# Patient Record
Sex: Male | Born: 1950 | Race: Black or African American | Hispanic: No | Marital: Single | Smoking: Current every day smoker
Health system: Southern US, Community
[De-identification: ages and names within clinical notes are randomized; demographics above are authoritative.]

## PROBLEM LIST (undated history)

## (undated) DIAGNOSIS — E785 Hyperlipidemia, unspecified: Secondary | ICD-10-CM

## (undated) DIAGNOSIS — F209 Schizophrenia, unspecified: Secondary | ICD-10-CM

## (undated) DIAGNOSIS — I10 Essential (primary) hypertension: Secondary | ICD-10-CM

## (undated) DIAGNOSIS — F32A Depression, unspecified: Secondary | ICD-10-CM

## (undated) DIAGNOSIS — F329 Major depressive disorder, single episode, unspecified: Secondary | ICD-10-CM

## (undated) DIAGNOSIS — F200 Paranoid schizophrenia: Secondary | ICD-10-CM

## (undated) DIAGNOSIS — F17200 Nicotine dependence, unspecified, uncomplicated: Secondary | ICD-10-CM

## (undated) DIAGNOSIS — F319 Bipolar disorder, unspecified: Secondary | ICD-10-CM

## (undated) DIAGNOSIS — K922 Gastrointestinal hemorrhage, unspecified: Secondary | ICD-10-CM

## (undated) DIAGNOSIS — K219 Gastro-esophageal reflux disease without esophagitis: Secondary | ICD-10-CM

## (undated) DIAGNOSIS — F03A0 Unspecified dementia, mild, without behavioral disturbance, psychotic disturbance, mood disturbance, and anxiety: Secondary | ICD-10-CM

## (undated) DIAGNOSIS — J449 Chronic obstructive pulmonary disease, unspecified: Secondary | ICD-10-CM

## (undated) HISTORY — DX: Major depressive disorder, single episode, unspecified: F32.9

## (undated) HISTORY — DX: Depression, unspecified: F32.A

## (undated) HISTORY — DX: Schizophrenia, unspecified: F20.9

## (undated) HISTORY — DX: Hyperlipidemia, unspecified: E78.5

## (undated) HISTORY — DX: Essential (primary) hypertension: I10

## (undated) HISTORY — DX: Bipolar disorder, unspecified: F31.9

---

## 2012-03-09 ENCOUNTER — Emergency Department: Payer: Self-pay

## 2012-03-09 LAB — BASIC METABOLIC PANEL
Co2: 29 mmol/L (ref 21–32)
Creatinine: 0.95 mg/dL (ref 0.60–1.30)
EGFR (African American): >60
Glucose: 102 mg/dL — ABNORMAL HIGH (ref 65–99)
Osmolality: 275 (ref 275–301)
Potassium: 3.9 mmol/L (ref 3.5–5.1)
Sodium: 138 mmol/L (ref 136–145)

## 2012-03-09 LAB — CBC
HCT: 42.7 % (ref 40.0–52.0)
HGB: 14.3 g/dL (ref 13.0–18.0)
MCH: 30.2 pg (ref 26.0–34.0)
MCHC: 33.4 g/dL (ref 32.0–36.0)
MCV: 91 fL (ref 80–100)
RBC: 4.72 10*6/uL (ref 4.40–5.90)

## 2012-03-09 LAB — URINALYSIS, COMPLETE
Blood: NEGATIVE
Glucose,UR: NEGATIVE mg/dL (ref 0–75)
Leukocyte Esterase: NEGATIVE
Nitrite: NEGATIVE
Ph: 6 (ref 4.5–8.0)
Squamous Epithelial: NONE SEEN

## 2012-08-26 ENCOUNTER — Ambulatory Visit: Payer: Self-pay | Admitting: Neurology

## 2012-08-26 IMAGING — CT CT HEAD WITHOUT CONTRAST
1 series · 16 of 30 positions shown, 20 images · non-contrast
Comparison: none

REASON FOR EXAM: Abn Gait
COMMENTS:

PROCEDURE:     INGRID - INGRID WITHOUT CONTRAST  - [DATE]  [DATE]
RESULT:     History: Normal data.
Compared to study: No recent.

[Series 2: soft tissue · axial · 0.39mm/px · z∈[-104,+31]mm · 16 of 31 slices shown, 20 images]
[im 2/31  brain]
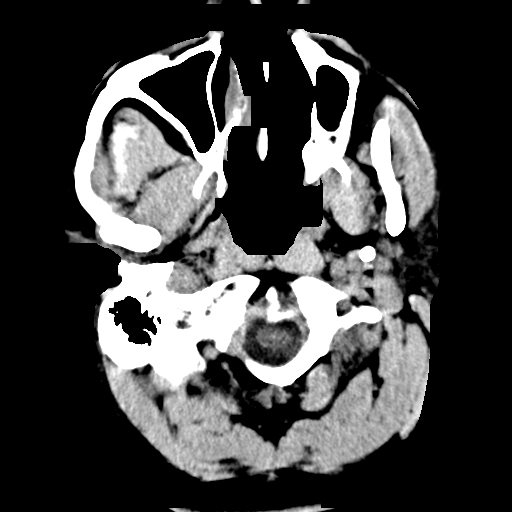
[im 2/31  bone]
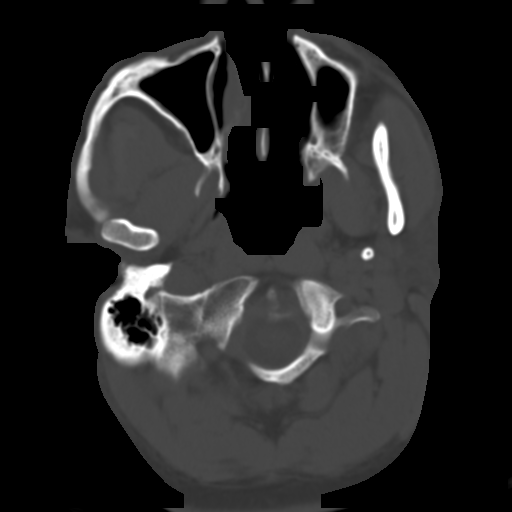
[im 4/31  brain]
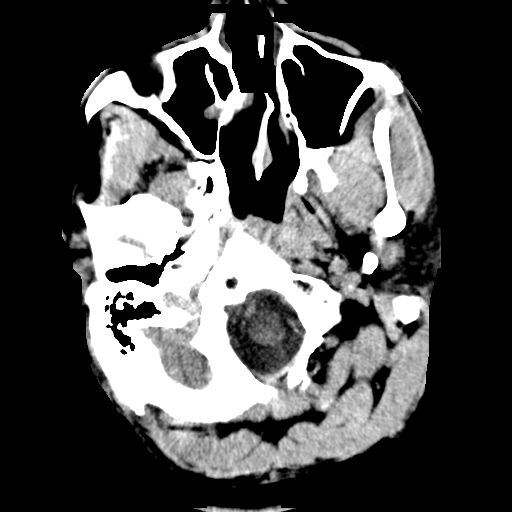
[im 6/31  brain]
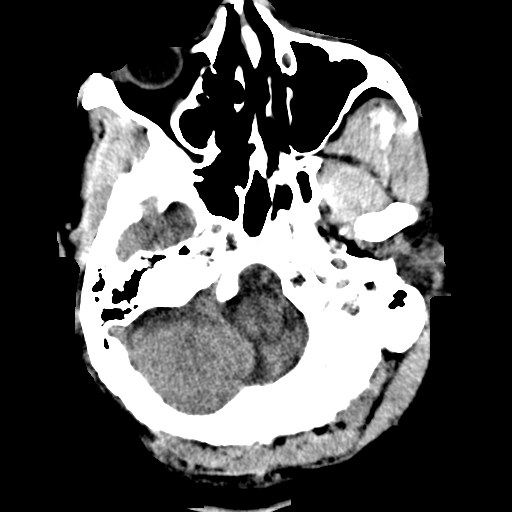
[im 8/31  brain]
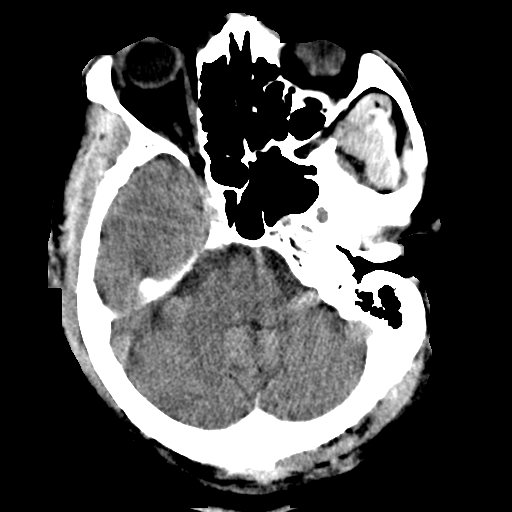
[im 9/31  brain]
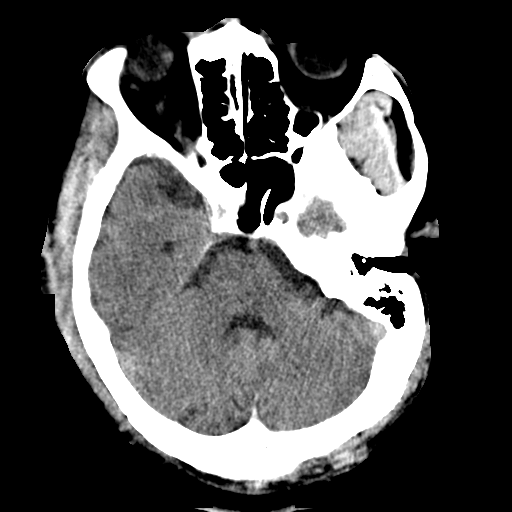
[im 9/31  bone]
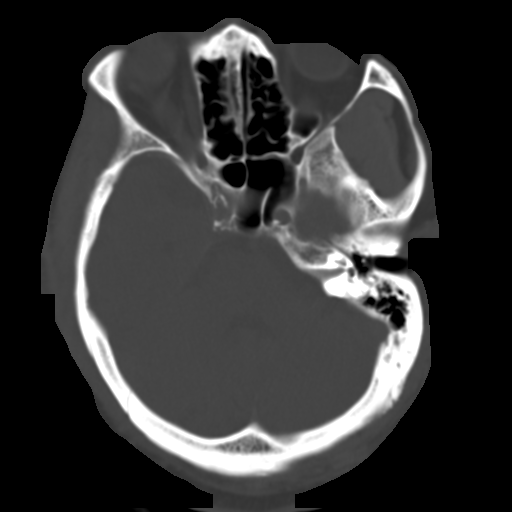
[im 11/31  brain]
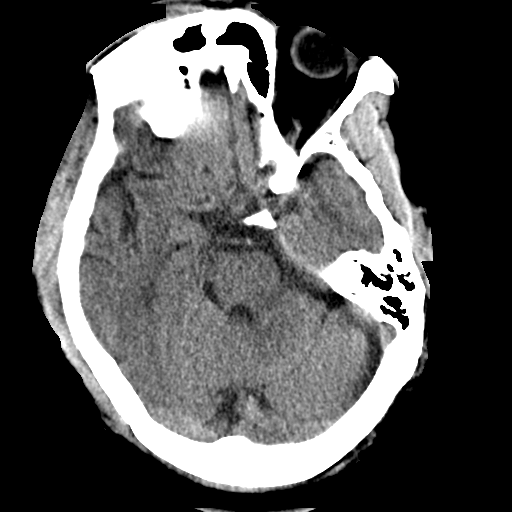
[im 13/31  brain]
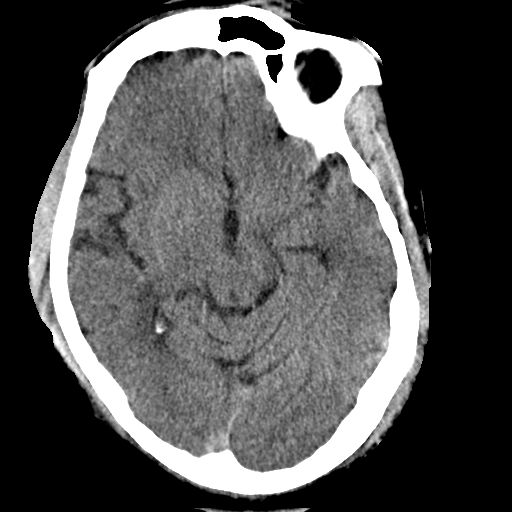
[im 15/31  brain]
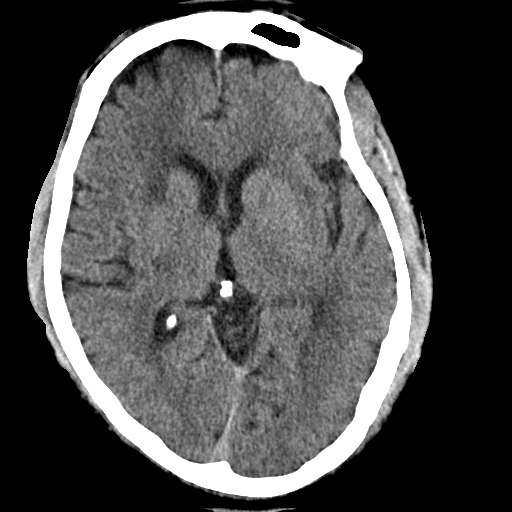
[im 16/31  brain]
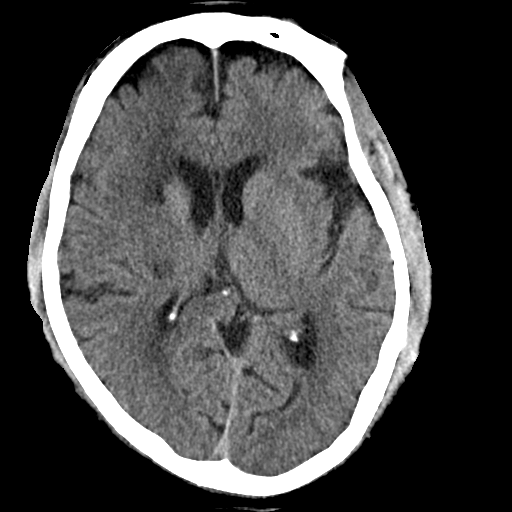
[im 16/31  bone]
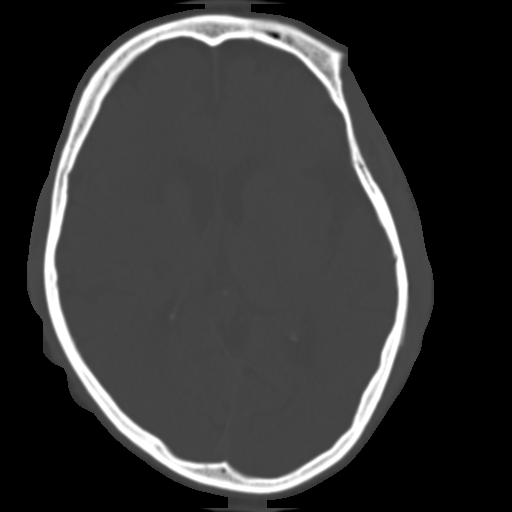
[im 18/31  brain]
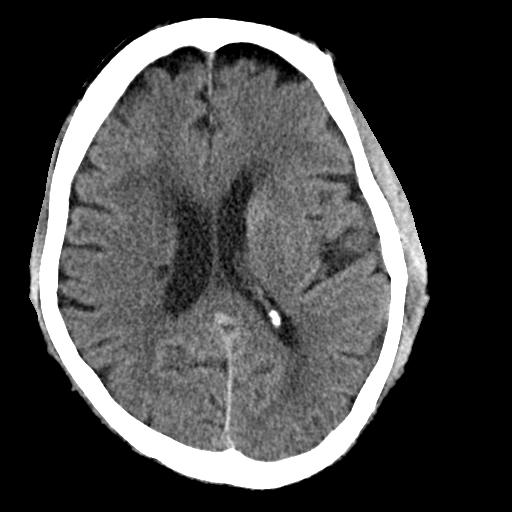
[im 20/31  brain]
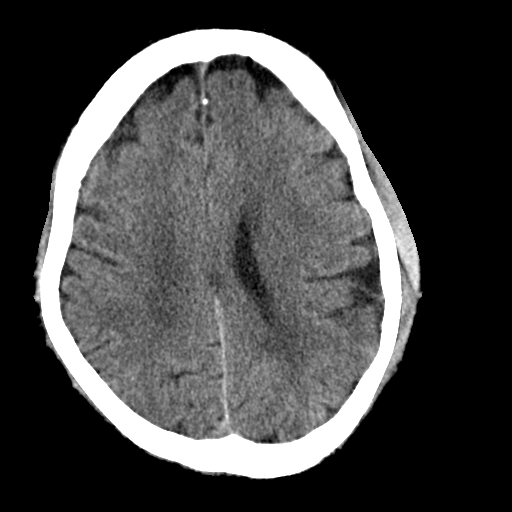
[im 22/31  brain]
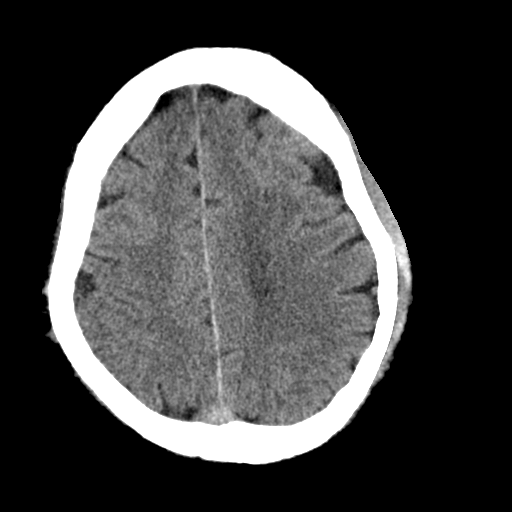
[im 23/31  brain]
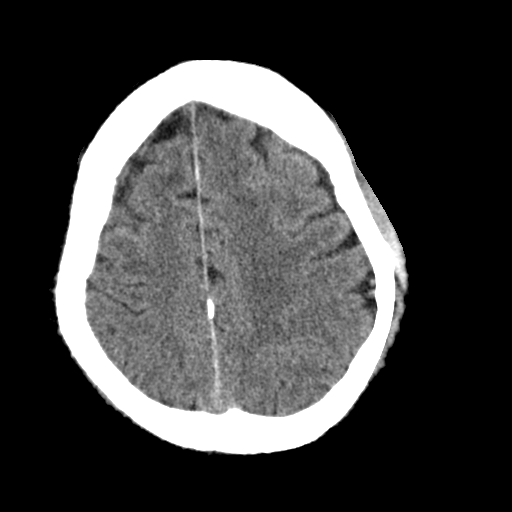
[im 23/31  bone]
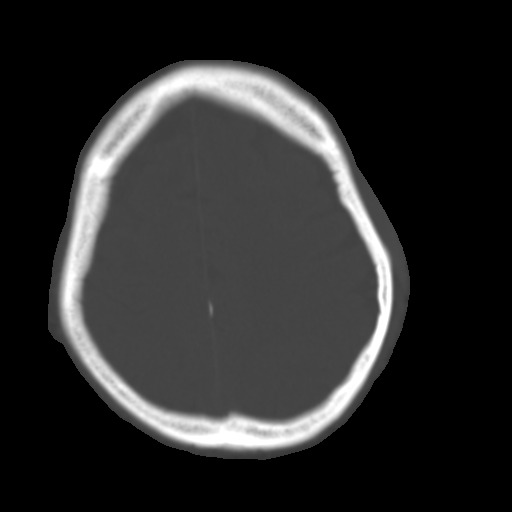
[im 25/31  brain]
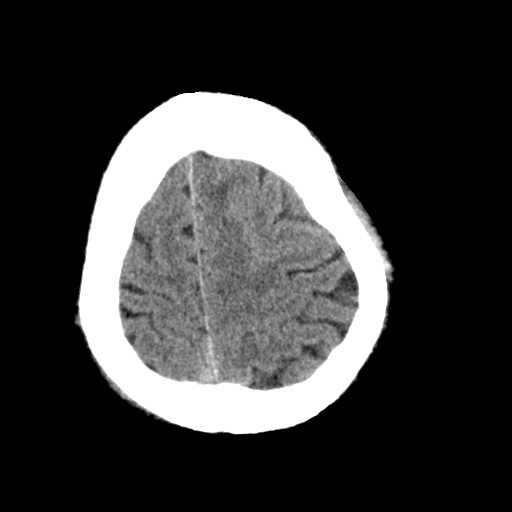
[im 27/31  brain]
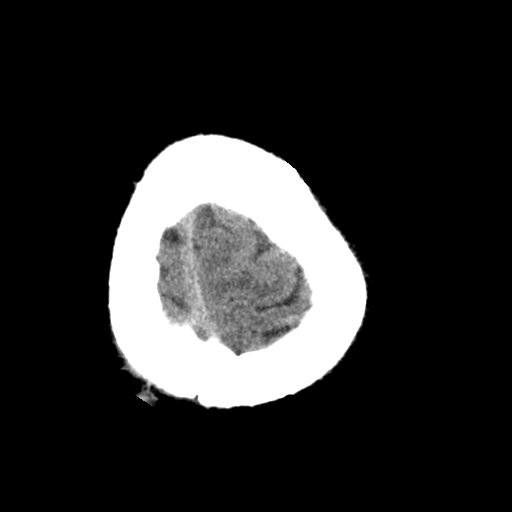
[im 29/31  brain]
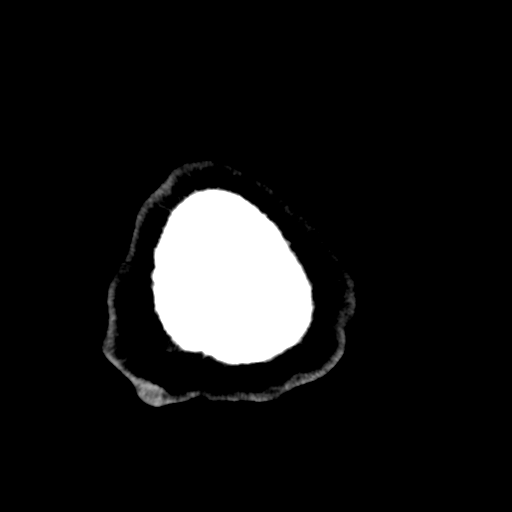

[16 of 30 positions shown; findings below may reference images not displayed]

FINDINGS: Standard nonenhanced CT obtained. Right periventricular white
matter changes are noted consistent with chronic ischemia. Diffuse atrophy
is present. No hydrocephalus. No hemorrhage. No acute bony abnormality
identified. Subcutaneous nodule noted in the right posterior parietal scalp.
This most likely sebaceous cyst.
IMPRESSION: Chronic ischemic change. No acute abnormality.

## 2013-01-03 ENCOUNTER — Inpatient Hospital Stay: Payer: Self-pay | Admitting: Internal Medicine

## 2013-01-03 LAB — COMPREHENSIVE METABOLIC PANEL
Albumin: 2.9 g/dL — ABNORMAL LOW (ref 3.4–5.0)
Alkaline Phosphatase: 69 U/L (ref 50–136)
Anion Gap: 6 — ABNORMAL LOW (ref 7–16)
BUN: 10 mg/dL (ref 7–18)
Bilirubin,Total: 0.4 mg/dL (ref 0.2–1.0)
Calcium, Total: 8.6 mg/dL (ref 8.5–10.1)
Co2: 26 mmol/L (ref 21–32)
EGFR (African American): >60
EGFR (Non-African Amer.): >60
Glucose: 136 mg/dL — ABNORMAL HIGH (ref 65–99)
Sodium: 138 mmol/L (ref 136–145)

## 2013-01-03 LAB — DRUG SCREEN, URINE
Benzodiazepine, Ur Scrn: NEGATIVE (ref ?–200)
Cannabinoid 50 Ng, Ur ~~LOC~~: NEGATIVE (ref ?–50)
Cocaine Metabolite,Ur ~~LOC~~: NEGATIVE (ref ?–300)
Methadone, Ur Screen: NEGATIVE (ref ?–300)
Opiate, Ur Screen: NEGATIVE (ref ?–300)
Phencyclidine (PCP) Ur S: NEGATIVE (ref ?–25)
Tricyclic, Ur Screen: POSITIVE (ref ?–1000)

## 2013-01-03 LAB — URINALYSIS, COMPLETE
Bacteria: NONE SEEN
Blood: NEGATIVE
Glucose,UR: NEGATIVE mg/dL (ref 0–75)
Leukocyte Esterase: NEGATIVE
RBC,UR: <1 /HPF (ref 0–5)
Squamous Epithelial: <1
WBC UR: 1 /HPF (ref 0–5)

## 2013-01-03 LAB — CK TOTAL AND CKMB (NOT AT ARMC)
CK, Total: 159 U/L (ref 35–232)
CK-MB: 2.8 ng/mL (ref 0.5–3.6)
CK-MB: 2.9 ng/mL (ref 0.5–3.6)

## 2013-01-03 LAB — CBC
MCV: 89 fL (ref 80–100)
Platelet: 113 10*3/uL — ABNORMAL LOW (ref 150–440)
RBC: 4.09 10*6/uL — ABNORMAL LOW (ref 4.40–5.90)
RDW: 14 % (ref 11.5–14.5)

## 2013-01-03 LAB — TROPONIN I
Troponin-I: <0.02 ng/mL
Troponin-I: <0.02 ng/mL

## 2013-01-03 LAB — HEMOGLOBIN: HGB: 7.6 g/dL — ABNORMAL LOW (ref 13.0–18.0)

## 2013-01-03 IMAGING — CT CT HEAD WITHOUT CONTRAST
1 series · 15 of 30 positions shown, 19 images · non-contrast
Comparison: none

REASON FOR EXAM: UNRESPONSIVE
COMMENTS:

[Series 2: soft tissue · axial · 0.46mm/px · z∈[-180,-45]mm · 15 of 30 slices shown, 19 images]
[im 2/30  brain]
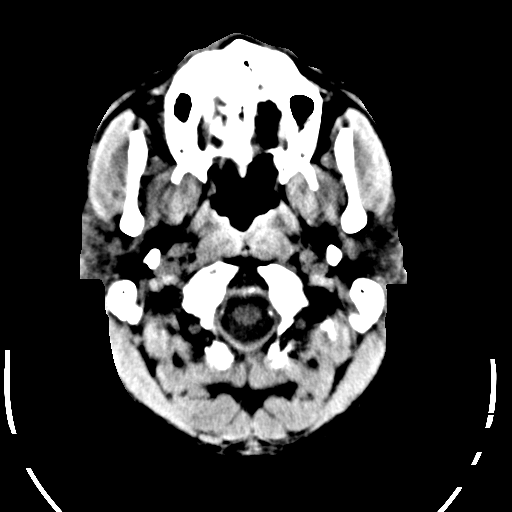
[im 2/30  bone]
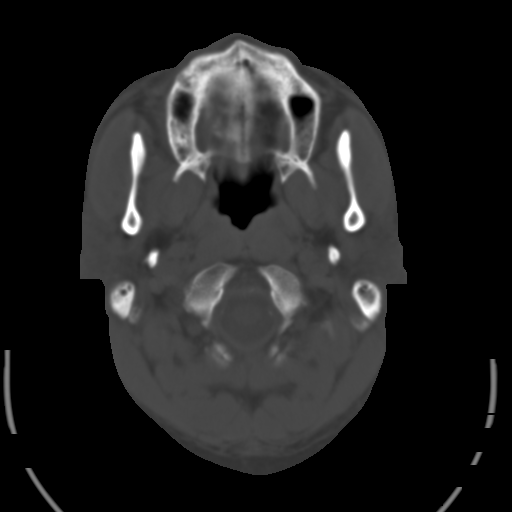
[im 4/30  brain]
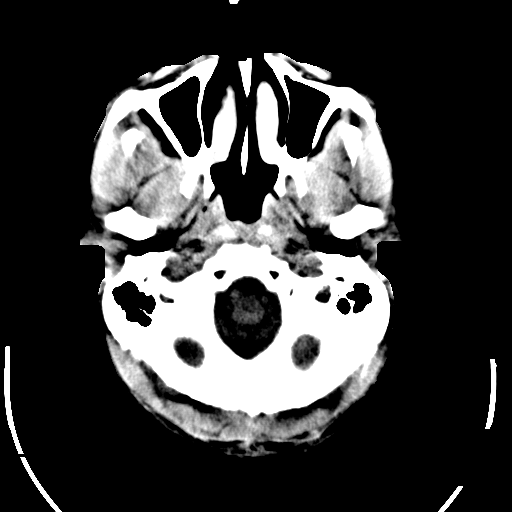
[im 6/30  brain]
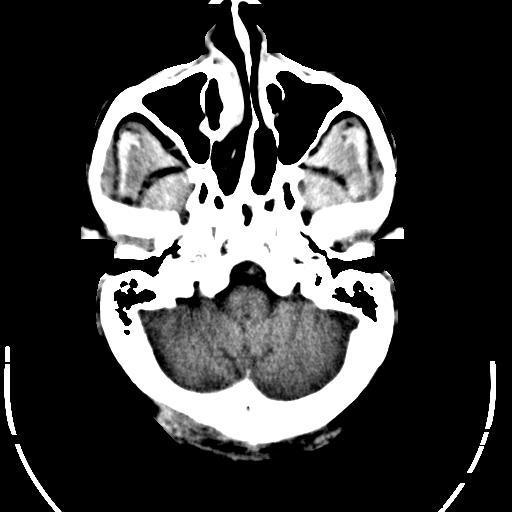
[im 8/30  brain]
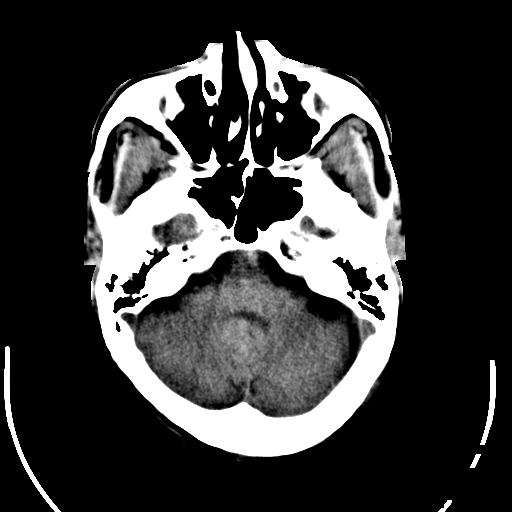
[im 10/30  brain]
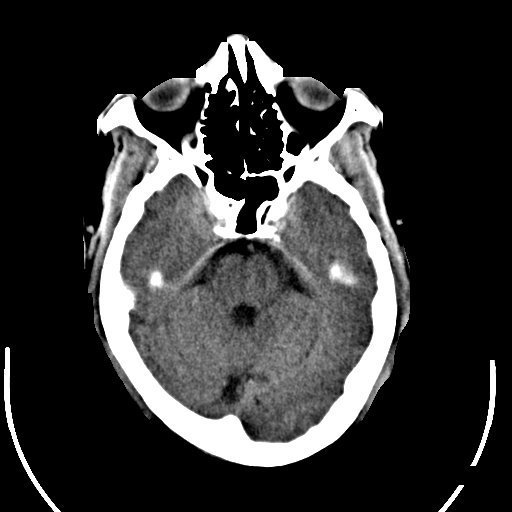
[im 10/30  bone]
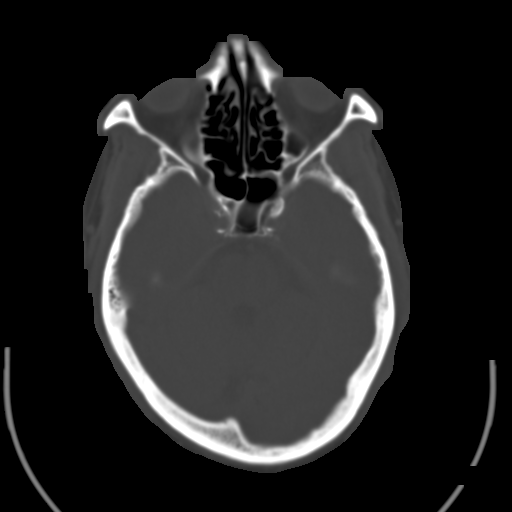
[im 12/30  brain]
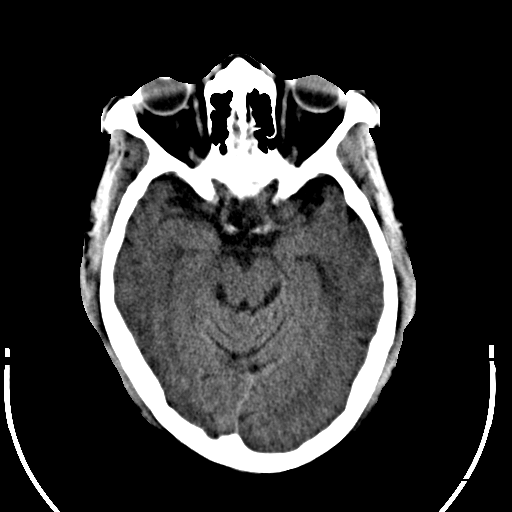
[im 14/30  brain]
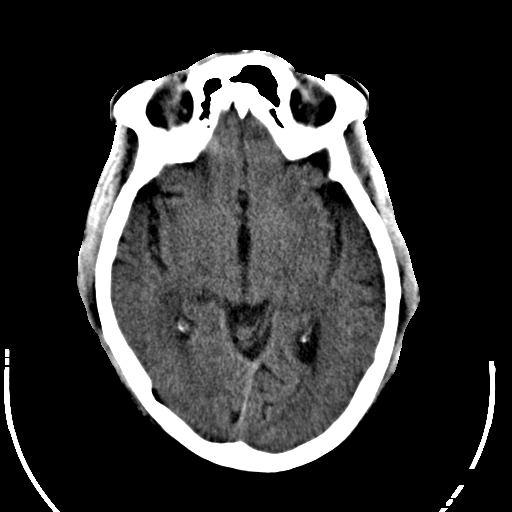
[im 16/30  brain]
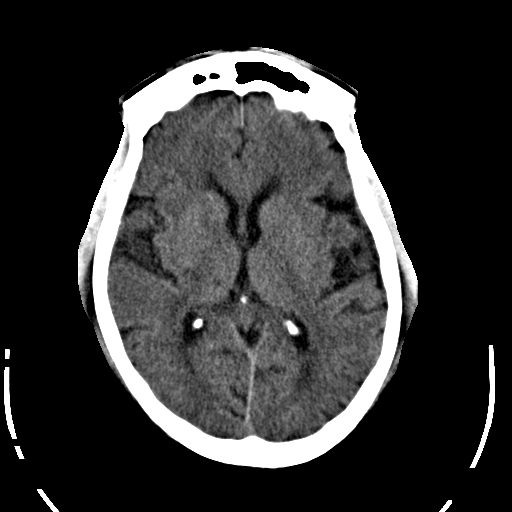
[im 17/30  brain]
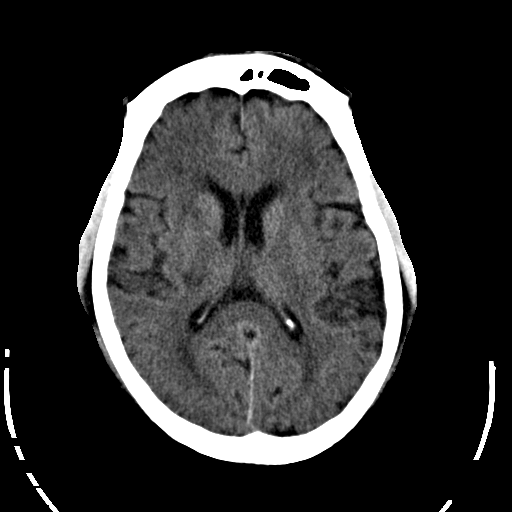
[im 17/30  bone]
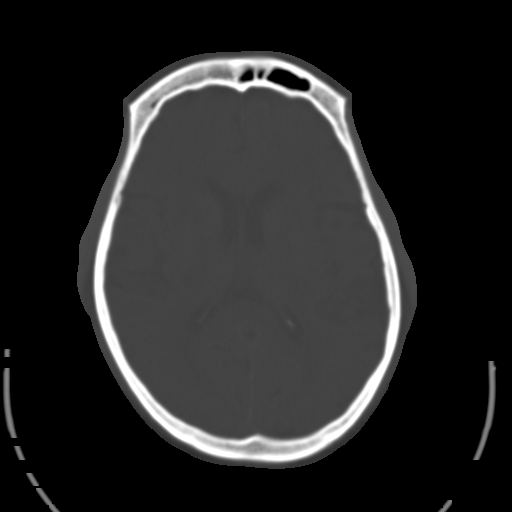
[im 19/30  brain]
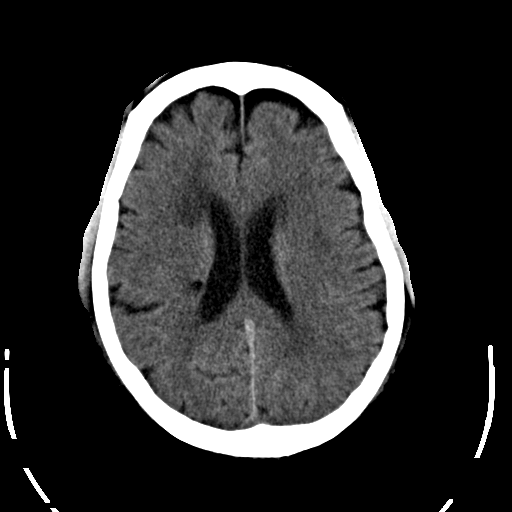
[im 21/30  brain]
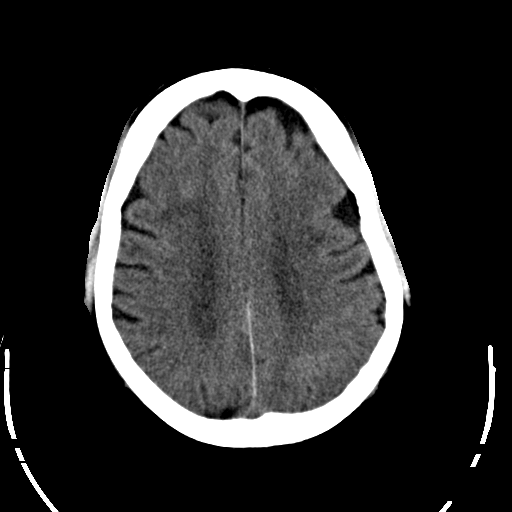
[im 23/30  brain]
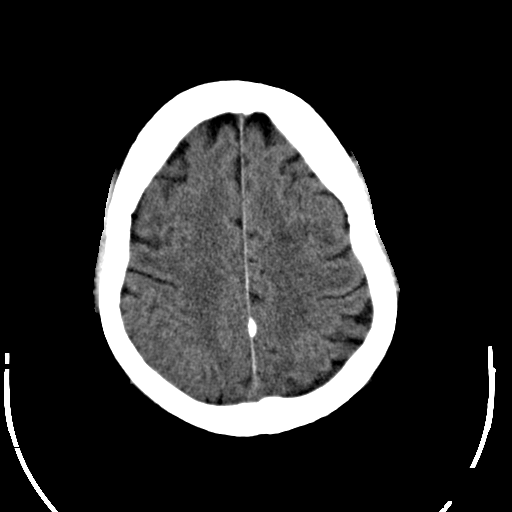
[im 25/30  brain]
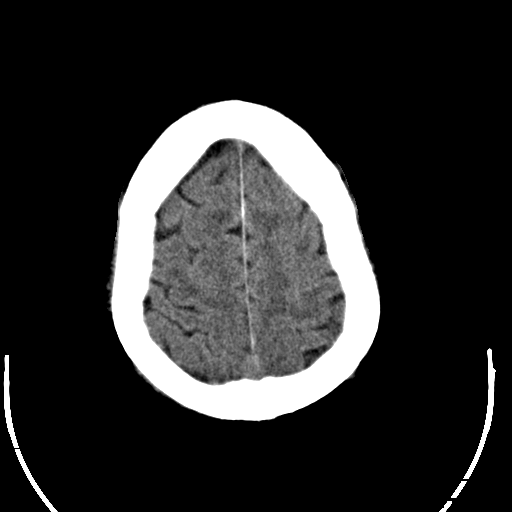
[im 25/30  bone]
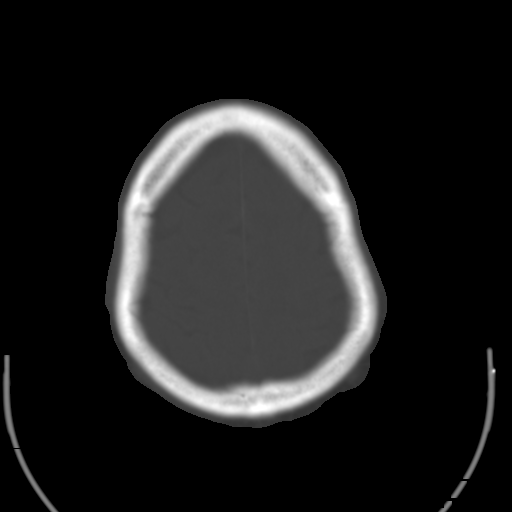
[im 27/30  brain]
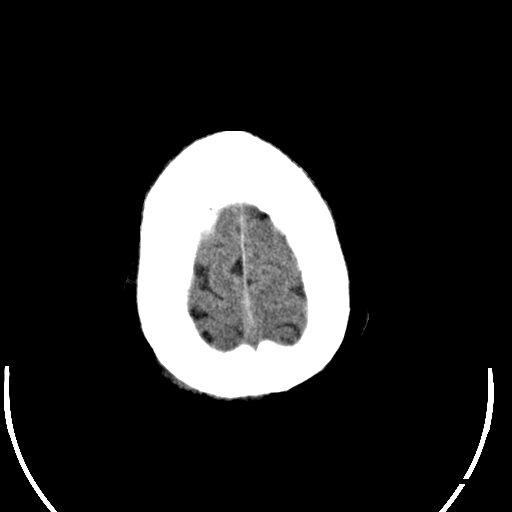
[im 29/30  brain]
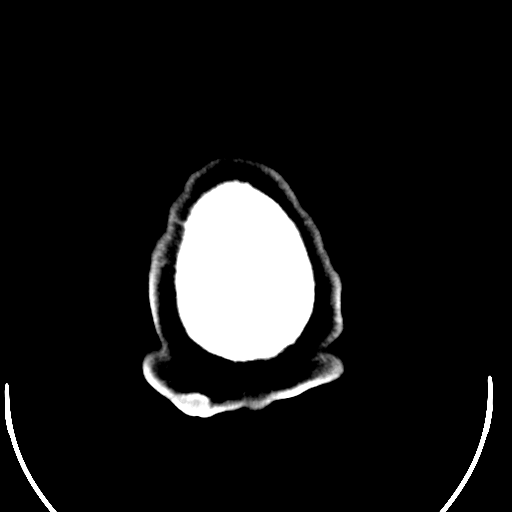

[15 of 30 positions shown; findings below may reference images not displayed]

PROCEDURE:     CT  - CT HEAD WITHOUT CONTRAST  - [DATE] [DATE]

RESULT:     Emergent noncontrast CT of brain is compared to the previous
examination dated [DATE].

There is mild prominence of the ventricles and sulci consistent with diffuse
atrophy. There are some well circumscribed low-attenuation areas in the
right thalamus as well as in the external capsule on the right and in the
periventricular white matter adjacent to the body of the right lateral
ventricle and adjacent to the head of the caudate nucleus along the anterior
limb of the internal capsule region consistent with old lacunar infarcts.
There is diffuse low-attenuation in the periventricular white matter and
subcortical white matter most consistent with chronic microvascular ischemic
disease. There may be an old small right frontal infarct. This is unchanged.
There is no intracranial hemorrhage, mass, mass effect or midline shift.
Sinuses and mastoid show normal aeration. The calvarium is intact without
fracture or lytic or sclerotic lesion.
IMPRESSION: 1. Atrophy with chronic microvascular ischemic disease and old small lacunar
infarcts as described. No acute abnormality or significant change evident.
MRI followup is available if there are changes concerning for an acute
ischemic event.

[REDACTED]

## 2013-01-03 IMAGING — US US CAROTID DUPLEX BILAT
1 series · 14 of 24 positions shown · non-contrast
Comparison: none

REASON FOR EXAM: syncope
COMMENTS:

PROCEDURE:     US  - US CAROTID DOPPLER BILATERAL  - [DATE]  [DATE]
RESULT:     Comparison: None
TECHNIQUE: Gray-scale, color Doppler, and spectral Doppler images were
obtained of the extracranial carotid artery systems and vertebral arteries
in the neck.

[Series 1: us carotid duplex bilat · 0.08mm/px · 14 of 58 slices shown]
[im 1/58]
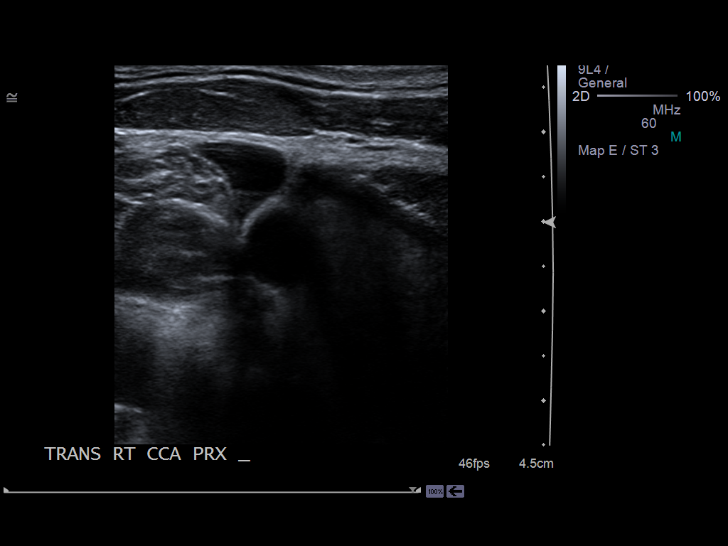
[im 5/58]
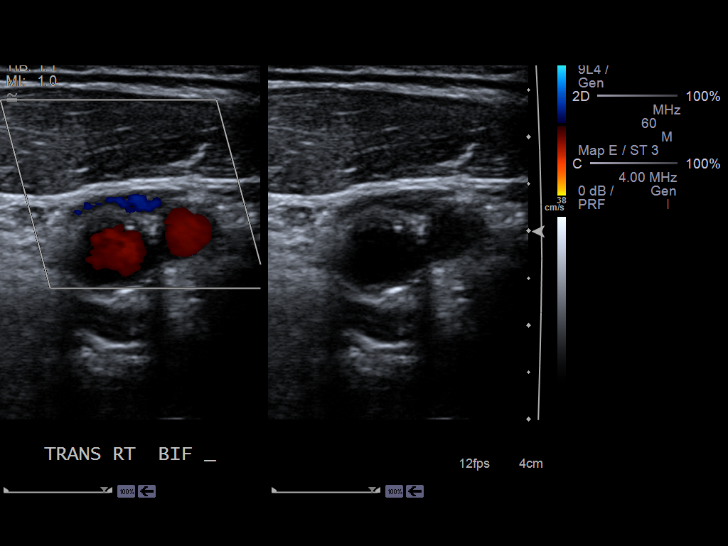
[im 10/58]
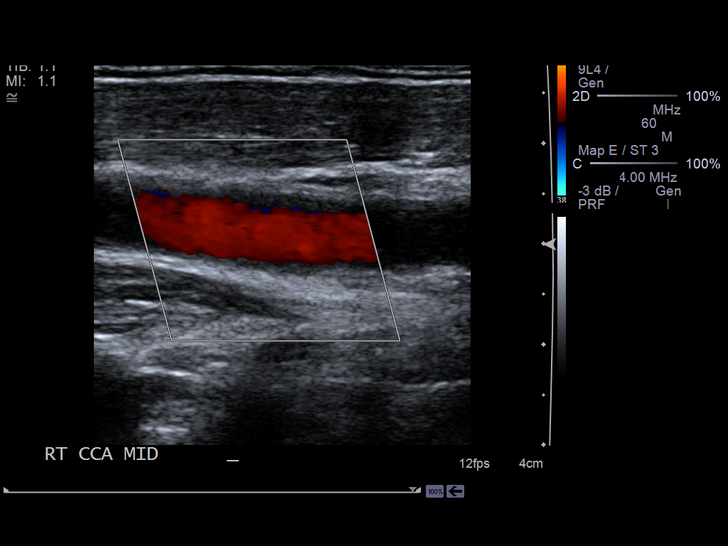
[im 15/58]
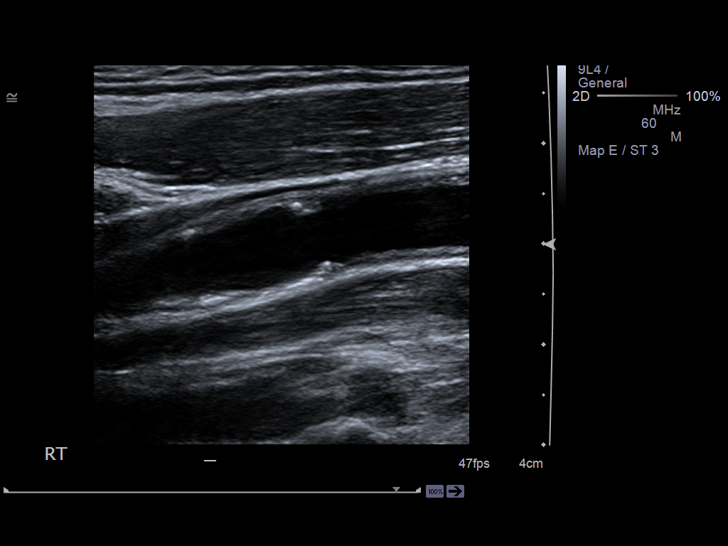
[im 18/58]
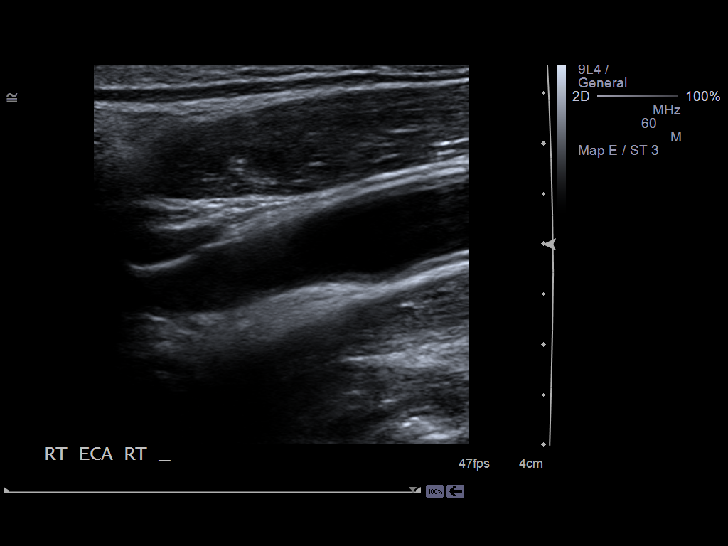
[im 23/58]
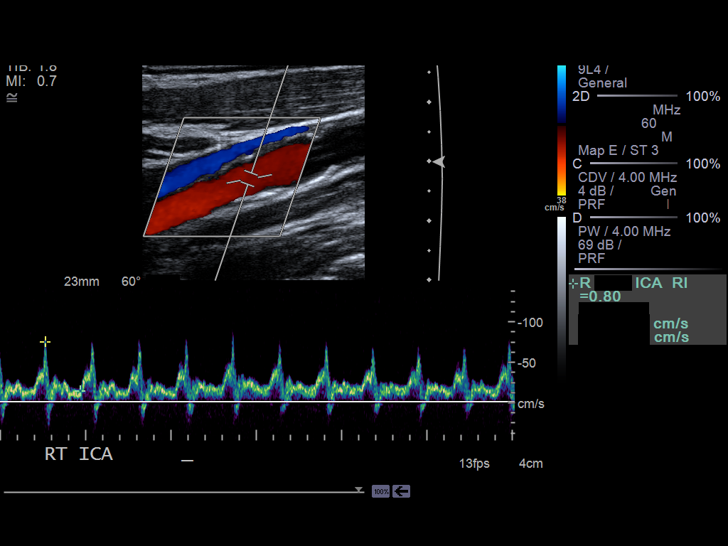
[im 28/58]
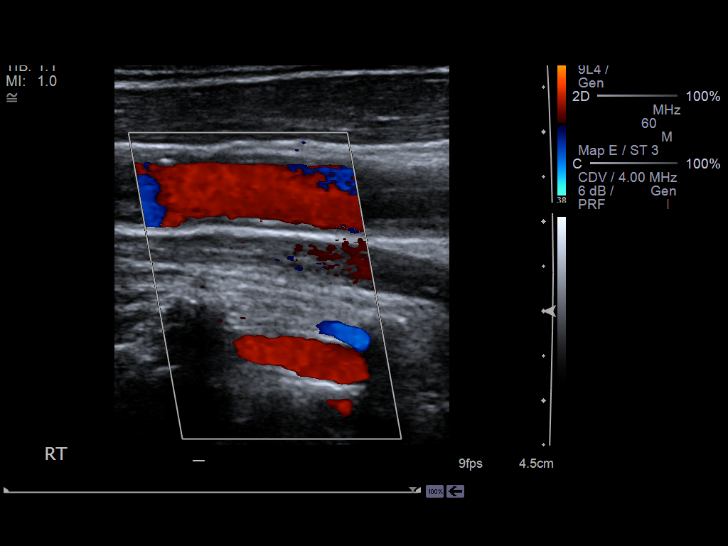
[im 30/58]
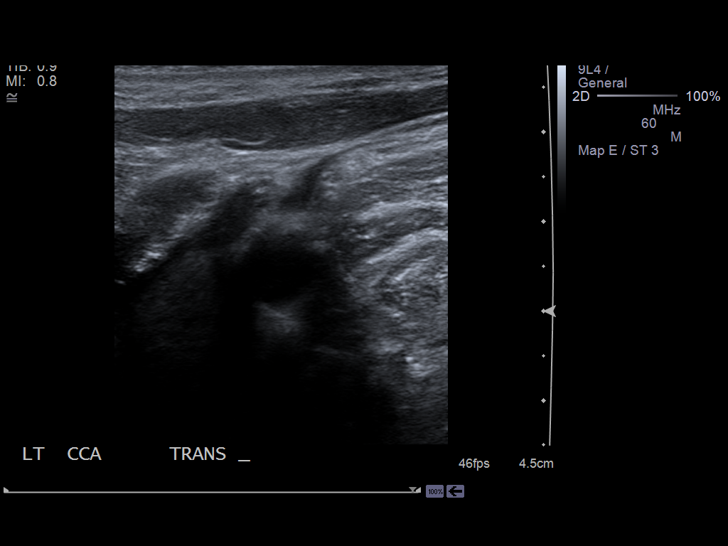
[im 35/58]
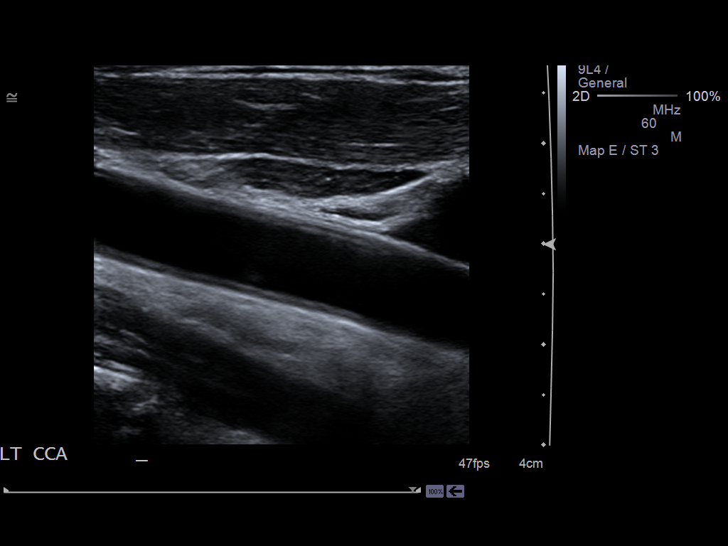
[im 40/58]
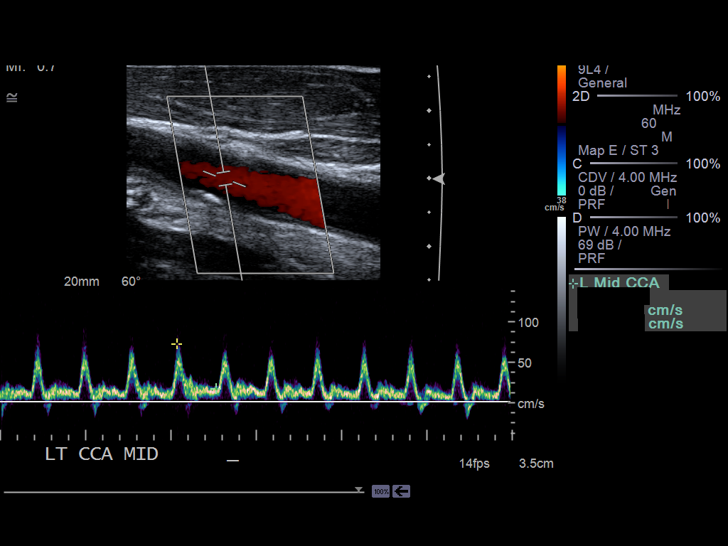
[im 45/58]
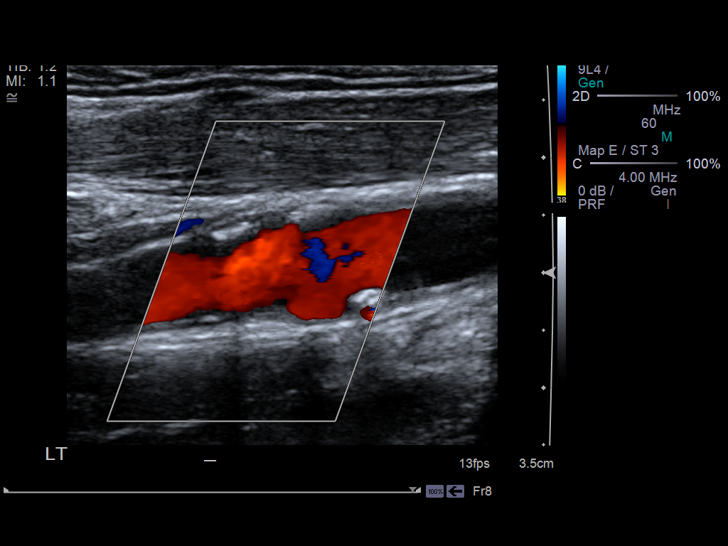
[im 48/58]
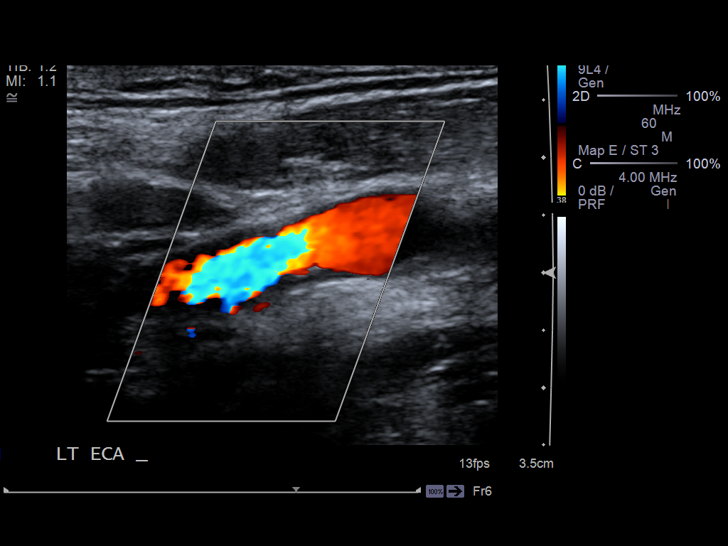
[im 53/58]
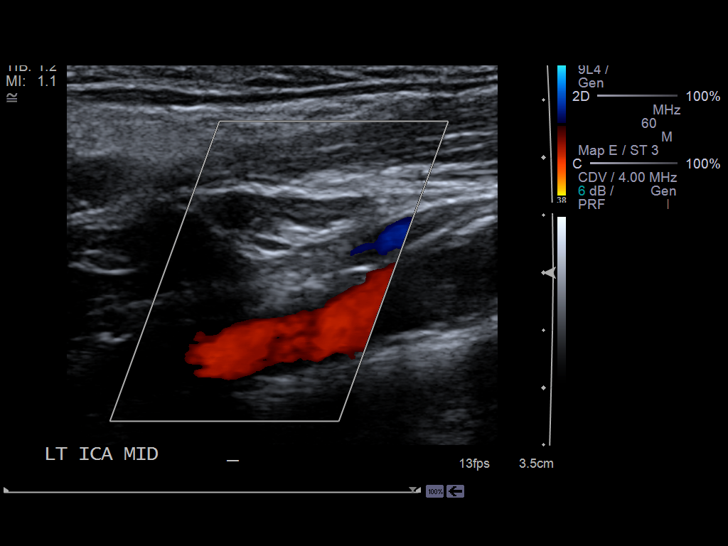
[im 58/58]
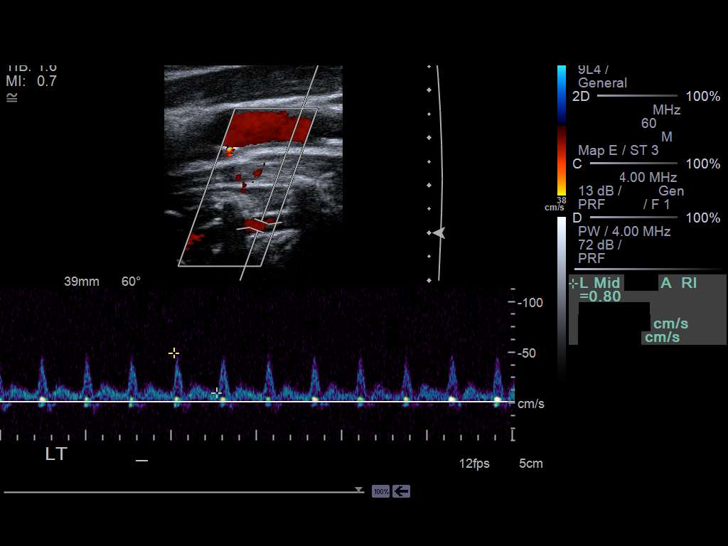

[14 of 24 positions shown; findings below may reference images not displayed]

FINDINGS: No significant atherosclerotic plaques identified in the extracranial
carotid arteries.  The peak systolic velocities are not elevated.

The vertebral arteries are patent bilaterally.
IMPRESSION: No evidence of hemodynamically significant stenosis in the extracranial
carotid arteries.

[REDACTED]

## 2013-01-04 LAB — BASIC METABOLIC PANEL
Anion Gap: 8 (ref 7–16)
BUN: 23 mg/dL — ABNORMAL HIGH (ref 7–18)
Calcium, Total: 7.7 mg/dL — ABNORMAL LOW (ref 8.5–10.1)
Chloride: 114 mmol/L — ABNORMAL HIGH (ref 98–107)
EGFR (African American): >60
EGFR (Non-African Amer.): >60
Glucose: 113 mg/dL — ABNORMAL HIGH (ref 65–99)
Potassium: 4.1 mmol/L (ref 3.5–5.1)
Sodium: 143 mmol/L (ref 136–145)

## 2013-01-04 LAB — CBC WITH DIFFERENTIAL/PLATELET
Basophil #: 0 10*3/uL (ref 0.0–0.1)
Basophil %: 0.4 %
Eosinophil #: 0 10*3/uL (ref 0.0–0.7)
Eosinophil %: 0.4 %
HGB: 7.4 g/dL — ABNORMAL LOW (ref 13.0–18.0)
MCH: 29.1 pg (ref 26.0–34.0)
MCHC: 33.4 g/dL (ref 32.0–36.0)
MCV: 87 fL (ref 80–100)
Neutrophil #: 7.5 10*3/uL — ABNORMAL HIGH (ref 1.4–6.5)
Neutrophil %: 67.1 %
Platelet: 92 10*3/uL — ABNORMAL LOW (ref 150–440)
RBC: 2.54 10*6/uL — ABNORMAL LOW (ref 4.40–5.90)
WBC: 11.2 10*3/uL — ABNORMAL HIGH (ref 3.8–10.6)

## 2013-01-04 LAB — TSH: Thyroid Stimulating Horm: 0.96 u[IU]/mL

## 2013-01-04 LAB — HEMOGLOBIN A1C: Hemoglobin A1C: 5.2 % (ref 4.2–6.3)

## 2013-01-04 LAB — LIPID PANEL
Cholesterol: 78 mg/dL (ref 0–200)
Triglycerides: 100 mg/dL (ref 0–200)

## 2013-01-04 LAB — HEMOGLOBIN: HGB: 8.5 g/dL — ABNORMAL LOW (ref 13.0–18.0)

## 2013-01-05 LAB — CBC WITH DIFFERENTIAL/PLATELET
Basophil #: 0 10*3/uL (ref 0.0–0.1)
Basophil %: 0 %
Eosinophil #: 0 10*3/uL (ref 0.0–0.7)
Eosinophil %: 0 %
HCT: 18.3 % — ABNORMAL LOW (ref 40.0–52.0)
HGB: 5.9 g/dL — ABNORMAL LOW (ref 13.0–18.0)
Lymphocyte #: 0.8 10*3/uL — ABNORMAL LOW (ref 1.0–3.6)
Lymphocyte %: 4.6 %
MCH: 28.3 pg (ref 26.0–34.0)
MCHC: 32.2 g/dL (ref 32.0–36.0)
MCV: 88 fL (ref 80–100)
Monocyte %: 4 %
Neutrophil #: 16.3 10*3/uL — ABNORMAL HIGH (ref 1.4–6.5)
RBC: 2.09 10*6/uL — ABNORMAL LOW (ref 4.40–5.90)
RDW: 16.1 % — ABNORMAL HIGH (ref 11.5–14.5)

## 2013-01-05 LAB — BASIC METABOLIC PANEL
Anion Gap: 8 (ref 7–16)
Glucose: 164 mg/dL — ABNORMAL HIGH (ref 65–99)
Potassium: 4.2 mmol/L (ref 3.5–5.1)

## 2013-01-06 LAB — CBC WITH DIFFERENTIAL/PLATELET
Basophil #: 0 10*3/uL (ref 0.0–0.1)
Basophil %: 0.1 %
Eosinophil %: 0 %
HGB: 7.4 g/dL — ABNORMAL LOW (ref 13.0–18.0)
Lymphocyte #: 1.1 10*3/uL (ref 1.0–3.6)
Lymphocyte %: 6.4 %
MCH: 29.2 pg (ref 26.0–34.0)
MCV: 87 fL (ref 80–100)
Monocyte #: 1.2 x10 3/mm — ABNORMAL HIGH (ref 0.2–1.0)
Monocyte %: 7 %
Neutrophil %: 86.5 %
Platelet: 88 10*3/uL — ABNORMAL LOW (ref 150–440)

## 2013-01-06 LAB — MAGNESIUM: Magnesium: 1.7 mg/dL — ABNORMAL LOW

## 2013-01-06 LAB — BASIC METABOLIC PANEL
Creatinine: 0.84 mg/dL (ref 0.60–1.30)
EGFR (African American): >60
EGFR (Non-African Amer.): >60
Osmolality: 282 (ref 275–301)
Sodium: 141 mmol/L (ref 136–145)

## 2013-01-07 LAB — CBC WITH DIFFERENTIAL/PLATELET
Basophil %: 0.2 %
HGB: 6.6 g/dL — ABNORMAL LOW (ref 13.0–18.0)
Monocyte %: 8.2 %
Neutrophil #: 8.7 10*3/uL — ABNORMAL HIGH (ref 1.4–6.5)
RBC: 2.21 10*6/uL — ABNORMAL LOW (ref 4.40–5.90)
RDW: 15 % — ABNORMAL HIGH (ref 11.5–14.5)

## 2013-01-08 LAB — CBC WITH DIFFERENTIAL/PLATELET
Eosinophil #: 0.1 10*3/uL (ref 0.0–0.7)
Eosinophil %: 1.3 %
HCT: 23.8 % — ABNORMAL LOW (ref 40.0–52.0)
Lymphocyte #: 1.6 10*3/uL (ref 1.0–3.6)
Lymphocyte %: 20.7 %
MCH: 30.4 pg (ref 26.0–34.0)
MCV: 88 fL (ref 80–100)
Monocyte #: 0.7 x10 3/mm (ref 0.2–1.0)
Monocyte %: 9.3 %
Neutrophil %: 68.2 %
Platelet: 111 10*3/uL — ABNORMAL LOW (ref 150–440)
RDW: 15 % — ABNORMAL HIGH (ref 11.5–14.5)
WBC: 7.9 10*3/uL (ref 3.8–10.6)

## 2013-01-08 LAB — BASIC METABOLIC PANEL
Anion Gap: 7 (ref 7–16)
BUN: 6 mg/dL — ABNORMAL LOW (ref 7–18)
Co2: 28 mmol/L (ref 21–32)
Creatinine: 0.79 mg/dL (ref 0.60–1.30)
Glucose: 72 mg/dL (ref 65–99)
Osmolality: 289 (ref 275–301)
Potassium: 3.1 mmol/L — ABNORMAL LOW (ref 3.5–5.1)

## 2013-01-09 LAB — CBC WITH DIFFERENTIAL/PLATELET
Basophil #: 0 10*3/uL (ref 0.0–0.1)
HCT: 21.7 % — ABNORMAL LOW (ref 40.0–52.0)
Lymphocyte #: 1.2 10*3/uL (ref 1.0–3.6)
MCH: 31.2 pg (ref 26.0–34.0)
MCHC: 35.4 g/dL (ref 32.0–36.0)
MCV: 88 fL (ref 80–100)
Monocyte #: 0.8 x10 3/mm (ref 0.2–1.0)
Neutrophil #: 6 10*3/uL (ref 1.4–6.5)
Platelet: 127 10*3/uL — ABNORMAL LOW (ref 150–440)
RBC: 2.46 10*6/uL — ABNORMAL LOW (ref 4.40–5.90)
RDW: 14.8 % — ABNORMAL HIGH (ref 11.5–14.5)
WBC: 8.2 10*3/uL (ref 3.8–10.6)

## 2013-01-09 LAB — POTASSIUM: Potassium: 3.3 mmol/L — ABNORMAL LOW (ref 3.5–5.1)

## 2013-11-27 ENCOUNTER — Other Ambulatory Visit: Payer: Self-pay | Admitting: Podiatry

## 2013-12-05 LAB — WOUND CULTURE

## 2014-11-09 NOTE — Discharge Summary (Signed)
PATIENT NAME:  [name], [name] MR#:  [id number] DATE OF BIRTH:  [birth year]  DATE OF ADMISSION:  01/03/2013 DATE OF DISCHARGE:  01/09/2013  ADMISSION DIAGNOSIS: Syncope.   DISCHARGE DIAGNOSES: 1.  Gastrointestinal bleed.  2.  Syncope, likely secondary to acute blood loss anemia.  3.  Acute blood loss anemia.  4.  Hypotension.  5.  Diabetes.  6.  Bipolar and depression.   CONSULTATIONS: 1.  Dr. Ida Roguehristopher Lundquist from surgery.  2.  Dr. Midge Miniumarren Wohl.  3.  Dr. Festus BarrenJason Dew.   PROCEDURES: The patient underwent mesenteric embolization to right colic and hepatic flexure branch on 01/04/2013.   On 01/06/2013, EGD showed normal esophagus, gastritis and normal examined duodenum.   Colonoscopy, on 01/06/2013, showed mucosal ulceration.   LABORATORY AND DIAGNOSTICS: Discharge hemoglobin 7.7.  A 2-D echocardiogram showed an EF of 70% to 75% with hyperdynamic global left ventricular systolic function.   Carotid Doppler showed no evidence of acute hemodynamic stenosis.  HOSPITAL COURSE:  This is a 64 year old male who initially presented with syncope and then the same day of hospitalization and was found to have acute blood loss anemia. For further details, please refer to the H and P.  1.  Lower GI bleed. The patient initially presented with syncope. His hemoglobin on admission was 12, however,  a few hours after admission the patient had bright red blood per rectum and was hypotensive. He was transferred to the CCU and transfused 2 units of PRBCs. He underwent a GI bleeding scan that showed active bleeding. He then underwent an angiogram with no active bleeding, but had hypervascularity to the right colon region. He had microbead embolization of the right colon and hepatic flexure on 01/05/2013 by Dr. Wyn Quakerew. He underwent an EGD and colonoscopy perform by GI showing some chronic ulcers probably related to ischemia from embolization, but no masses. He did not have any active bleeding. He is tolerating  his regular diet. His hemoglobin is 7.7 at discharge.  He received a total of 6 units of PRBCs during this admission.  2. Acute blood loss anemia from problems as listed above. The patient has received 6 units of PRBCs.  We never found a clear source of his acute blood loss anemia.  3.  Syncope on admission and hypotensive with active blood loss. His echo showed hyperdynamic LV function, Dopplers no significant stenosis. I suspect it is due to this GI bleed. 4.  Hypotension, hypovolemic and hemorrhagic, responding well to IV fluids and blood transfusion.  5.  Diabetes. Seems to be diet controlled.  6.  Bipolar with depression. The patient will continue his outpatient medication.  DISCHARGE MEDICATIONS: 1.  Tamsulosin 0.4 mg daily.  2.  Lisinopril 20 mg daily.  3.  Citalopram 40 mg daily.  4.  Norvasc 5 mg daily.  5.  Oxybutynin 5 mg b.i.d.  6.  Lorazepam 1 mg b.i.d.  7.  Risperidone 4 mg at bedtime.  8.   100 mg 3 tablets at bedtime.  9.  Simvastatin 20 mg at bedtime.  10.  Protonix 40 mg daily.   DISCHARGE DIET: ADA, low sodium.   DISCHARGE ACTIVITY: As tolerated.   DISCHARGE FOLLOWUP:  With surgery in 1 week.  The patient is medically stable for discharge.   TIME SPENT: Approximately 35 minutes.  ____________________________ Janyth ContesSital P. Juliene PinaMody, MD spm:sb D: 01/09/2013 13:51:45 ET T: 01/09/2013 14:14:26 ET JOB#: [id number]  cc: Allanah Mcfarland P. Juliene PinaMody, MD, <Dictator> Si Raiderhristopher A. Juliann PulseLundquist, MD Janyth ContesSITAL P Javian Nudd MD ELECTRONICALLY SIGNED 01/10/2013  11:32 

## 2014-11-09 NOTE — Consult Note (Signed)
PATIENT NAME:  [name], [name] MR#:  [id number] DATE OF BIRTH:  May 23, 1950  DATE OF CONSULTATION:  01/03/2013  CONSULTING PHYSICIAN:  Midge Miniumarren Susie Ehresman, MD  CONSULTING SERVICE:  Gastroenterology  REASON FOR CONSULTATION:  Lower GI bleed.   HISTORY OF PRESENT ILLNESS:  This patient is a 64 year old gentleman with bipolar disorder and unable to give much of a history. The patient lives in a group home. The patient was brought in by EMS after having syncope. The patient was noted to have a low blood pressure at 70/40 on admission and had large amounts of rectal bleeding. The patient was said to pass maroon stools with clots. The patient denies any abdominal pain. He also is not able to tell me if he has ever had a colonoscopy in the past. The patient appears to not have had any previous hospital admissions to this hospital. The patient had a CT scan and had a rapid response during that CT scan. The patient's head CT showed atrophy with microvascular ischemic disease and some old infarcts, but no acute abnormalities. The patient's hemoglobin on admission was 12.2 with a repeat hemoglobin this morning at 8.2. The patient also had an ultrasound of the carotids without any significant stenosis.   PAST MEDICAL HISTORY:  Hyperlipidemia, stress urgency, COPD, obstructive sleep apnea, bipolar disorder, schizophrenia, diabetes.   ALLERGIES:  No known drug allergies.   HOME MEDICATIONS:  Simvastatin, risperidone, tamsulosin, oxybutynin, lorazepam, lisinopril, citalopram, chlorpromazine, aspirin, amlodipine.   SOCIAL HISTORY:  Smokes 1 pack of cigarettes a day. Denies alcohol or drug abuse. Lives in a group home.   FAMILY HISTORY:  Noncontributory.   REVIEW OF SYSTEMS:  The patient not able to give much of a review of system due to his mental status.   PHYSICAL EXAMINATION: GENERAL: The patient is lying in bed in no apparent distress, alert.  VITAL SIGNS: Temperature 98.7, pulse 118, respirations 18, blood  pressure 130/68, pulse oximetry 98%.  HEENT: Normocephalic, atraumatic. Extraocular motor intact. Pupils equally reactive to light and accommodation, without JVD, without lymphadenopathy.  LUNGS: Clear to auscultation bilaterally.  HEART: Regular rhythm, rate tachycardic, without gallops or rubs.  ABDOMEN: Soft, nontender, nondistended without hepatosplenomegaly, without rebound, without guarding.  EXTREMITIES: Without cyanosis, clubbing or edema.  NEUROLOGICAL: Grossly intact.  SKIN: Without any rashes or lesions.  ANCILLARY SERVICES: As stated above.   ASSESSMENT AND PLAN:  This is a 64 year old gentleman with a lower GI bleed passing maroon stools. The patient has not been prepped for a colonoscopy and is unlikely to have any benefit from an urgent colonoscopy. The patient should have a bleeding scan due to his hypotension and rapid drop in his hemoglobin from 12 to 8. The patient should be evaluated by surgery and vascular surgery for possible angiography and possible total colectomy if the bleeding becomes unstable. The patient should have a bleeding scan if stable enough to go down for this.   Thank you very much for involving me in the care of this patient. If you have any questions, please do not hesitate to call.  ____________________________ Midge Miniumarren Jacqulyne Gladue, MD dw:si D: 01/03/2013 16:16:00 ET T: 01/03/2013 17:17:15 ET JOB#: [id number]  cc: Midge Miniumarren Lecia Esperanza, MD, <Dictator> Midge MiniumARREN Cataleah Stites MD ELECTRONICALLY SIGNED 01/05/2013 9:02

## 2014-11-09 NOTE — Consult Note (Signed)
Chief Complaint:  Subjective/Chief Complaint Denies any further rectal bleeding.  Denies abdominal pain, nausea or vomiting.   VITAL SIGNS/ANCILLARY NOTES: **Vital Signs.:   23-Jun-14 06:07  Vital Signs Type Routine  Temperature Temperature (F) 99.6  Celsius 37.5  Temperature Source oral  Pulse Pulse 86  Respirations Respirations 20  Systolic BP Systolic BP 155  Diastolic BP (mmHg) Diastolic BP (mmHg) 76  Mean BP 102  Pulse Ox % Pulse Ox % 92  Pulse Ox Activity Level  At rest  Oxygen Delivery Room Air/ 21 %   Brief Assessment:  GEN well developed, well nourished, no acute distress, A/Ox3   Cardiac Regular   Respiratory normal resp effort   Gastrointestinal details normal Soft  Nontender  Nondistended  Bowel sounds normal   EXTR negative edema   Additional Physical Exam Skin: Warm & dry   Lab Results: Routine Chem:  23-Jun-14 04:32   Potassium, Serum  3.3 (Result(s) reported on 09 Jan 2013 at 05:07AM.)  Routine Hem:  23-Jun-14 04:32   WBC (CBC) 8.2  RBC (CBC)  2.46  Hemoglobin (CBC)  7.7  Hematocrit (CBC)  21.7  Platelet Count (CBC)  127  MCV 88  MCH 31.2  MCHC 35.4  RDW  14.8  Neutrophil % 72.4  Lymphocyte % 14.9  Monocyte % 9.7  Eosinophil % 2.7  Basophil % 0.3  Neutrophil # 6.0  Lymphocyte # 1.2  Monocyte # 0.8  Eosinophil # 0.2  Basophil # 0.0 (Result(s) reported on 09 Jan 2013 at 05:07AM.)   Assessment/Plan:  Assessment/Plan:  Assessment Lower GI Bleed:  s/p embolization of right colon/hepatic flexure.  Doing well no further bleeding. Gastritis: On PPI Anemia: Secondary to GI bleed.  Hgb 7.7, was 8.2 yesterday.  No gross bleeding.   Plan 1) Continue PPI 2) Monitor for recurrent bleeding 3) H/H in AM   Electronic Signatures: Joselyn ArrowJones, Mitsuko Luera L (NP)  (Signed 23-Jun-14 09:35)  Authored: Chief Complaint, VITAL SIGNS/ANCILLARY NOTES, Brief Assessment, Lab Results, Assessment/Plan   Last Updated: 23-Jun-14 09:35 by Joselyn ArrowJones, Ranier Coach L (NP)

## 2014-11-09 NOTE — Op Note (Signed)
PATIENT NAME:  [name], [name] MR#:  [id number] DATE OF BIRTH:  [birth year]  DATE OF PROCEDURE:  01/04/2013  PREOPERATIVE DIAGNOSES: 1.  Gastrointestinal bleeding.  2.  Hemorrhagic anemia.  3.  Bipolar/schizophrenia.  4.  Hypertension.  5.  Obesity.   POSTOPERATIVE DIAGNOSES:  1.  Gastrointestinal bleeding.  2.  Hemorrhagic anemia.  3.  Bipolar/schizophrenia.  4.  Hypertension.  5.  Obesity.   PROCEDURES:   1. Catheter placement into right colonic and hepatic flexure branches of superior mesenteric artery.  2.  Aortogram and selective SMA angiogram.  3. Microbead embolization of right colonic and hepatic flexure branches with 500 and 700 micron polyvinyl alcohol beads.  4.  StarClose closure device, right femoral artery.   SURGEON:  Annice NeedyJason S Dew, MD   ANESTHESIA:  Local with moderate conscious sedation.   BLOOD LOSS:  Minimal.   INDICATION FOR PROCEDURE: A 64 year old African-American male with acute active GI bleeding. He had a positive bleeding scan. He has had blood loss, from a hemoglobin of 12 down to a hemoglobin of 7.4, despite blood transfusion of 1 unit. He is continuing to have bleeding, although it has slowed today, and has failed observation and conservative management. He is brought down for embolization as a minimally invasive form of treatment of his GI bleeding. Risks and benefits were discussed. Informed consent was obtained.   DESCRIPTION OF PROCEDURE: The patient is brought to the vascular interventional radiology suite. Groins were shaved and prepped, and a sterile surgical field was created. The right femoral head was localized with fluoroscopy. The right femoral artery was accessed without difficulty with a Seldinger needle. A J-wire and 5 French sheath were placed. Pigtail catheter was placed in the aorta, and an AP aortogram was performed. This showed normal origins of the celiac, SMA and the renal arteries. I then used a C2 catheter to selectively cannulate the  SMA. Selective imaging was performed. I was able to cannulate a right colonic branch off of the IMA as well as a branch to the middle colic branch that then went into the region of the hepatic flexure. Imaging was performed, which showed a reasonably high degree of vascularity to the right colon and hepatic flexure. In these 2 locations, a total of 3.5 mL of 500 and 700 micron polyvinyl alcohol beads were deployed. Following this, there was less brisk flow into the area, although the large vessels remained intact. At this point, we had placed a significant amount of the polyvinyl alcohol beads for microembolization, and I felt this resolved. It was safe to place in this location, if he has continued bleeding, consideration for either endoscopic or open surgical therapy will need to be considered. At this point, I terminated the procedure. The diagnostic catheter was removed. Oblique arteriogram was performed. StarClose closure device was deployed in the usual fashion, with excellent hemostatic result. The patient tolerated the procedure well, and was taken to the recovery room in stable condition.     ____________________________ Annice NeedyJason S. Dew, MD jsd:mr D: 01/04/2013 15:28:33 ET T: 01/04/2013 20:34:31 ET JOB#: [id number]  cc: Annice NeedyJason S. Dew, MD, <Dictator> Annice NeedyJASON S DEW MD ELECTRONICALLY SIGNED 01/12/2013 14:43

## 2014-11-09 NOTE — Consult Note (Signed)
Chief Complaint:  Subjective/Chief Complaint seen for rectal bleeding/lower GI bleed.  No recurrent gi bleeding, denies n/v or abdominal pain.  No bm overnight.  Currently on clear liquids.   VITAL SIGNS/ANCILLARY NOTES: **Vital Signs.:   21-Jun-14 14:38  Temperature Temperature (F) 99.4  Celsius 37.4  Pulse Pulse 86  Respirations Respirations 20  Systolic BP Systolic BP 150  Diastolic BP (mmHg) Diastolic BP (mmHg) 75  Mean BP 100  Pulse Ox % Pulse Ox % 97  Oxygen Delivery Room Air/ 21 %    15:40  Temperature Temperature (F) 100  Celsius 37.7  Temperature Source oral  Pulse Pulse 86  Respirations Respirations 20  Systolic BP Systolic BP 151  Diastolic BP (mmHg) Diastolic BP (mmHg) 73  Mean BP 99  Pulse Ox % Pulse Ox % 97  Pulse Ox Activity Level  At rest  Oxygen Delivery Room Air/ 21 %   Brief Assessment:  Cardiac Regular   Respiratory clear BS   Gastrointestinal details normal Soft  Nontender  No masses palpable  Bowel sounds normal  protuberant   Lab Results: Routine BB:  21-Jun-14 09:49   ABO Group + Rh Type O Positive  Antibody Screen NEGATIVE (Result(s) reported on 07 Jan 2013 at 11:02AM.)  Crossmatch Unit 1 Issued  Crossmatch Unit 2 Issued (Result(s) reported on 07 Jan 2013 at 04:41PM.)  Routine Hem:  17-Jun-14 00:41   Hemoglobin (CBC)  12.1    09:48   Hemoglobin (CBC)  8.2 (Result(s) reported on 03 Jan 2013 at 10:23AM.)    17:05   Hemoglobin (CBC)  7.6 (Result(s) reported on 03 Jan 2013 at 05:53PM.)  18-Jun-14 04:34   Hemoglobin (CBC)  7.4    19:59   Hemoglobin (CBC)  8.5 (Result(s) reported on 04 Jan 2013 at 08:07PM.)  19-Jun-14 04:23   Hemoglobin (CBC)  5.9    15:01   Hemoglobin (CBC)  8.7 (Result(s) reported on 05 Jan 2013 at 03:22PM.)  20-Jun-14 07:41   Hemoglobin (CBC)  7.4  21-Jun-14 04:51   WBC (CBC)  11.1  RBC (CBC)  2.21  Hemoglobin (CBC)  6.6  Hematocrit (CBC)  19.4  Platelet Count (CBC)  98  MCV 88  MCH 29.8  MCHC 33.9  RDW  15.0   Neutrophil % 78.8  Lymphocyte % 12.6  Monocyte % 8.2  Eosinophil % 0.2  Basophil % 0.2  Neutrophil #  8.7  Lymphocyte # 1.4  Monocyte # 0.9  Eosinophil # 0.0  Basophil # 0.0 (Result(s) reported on 07 Jan 2013 at 05:18AM.)   Assessment/Plan:  Assessment/Plan:  Assessment 1) hematochezia-positive bleeding scan right to mid colon, with microembolization by vascular surgery.  No recurrent bleeding.  Hemodynamically stable.  Drop of hgb noted this am and currently being transfused.  Colonic ulcers noted on c-scope and egd with mild gastritis.   Plan 1) continue current, appreciate IM and Surgery following.  May be possible to advance diet tomorrow to full liquids.   Electronic Signatures: Barnetta ChapelSkulskie, Luvada Salamone (MD)  (Signed 21-Jun-14 18:57)  Authored: Chief Complaint, VITAL SIGNS/ANCILLARY NOTES, Brief Assessment, Lab Results, Assessment/Plan   Last Updated: 21-Jun-14 18:57 by Barnetta ChapelSkulskie, Kiril Hippe (MD)

## 2014-11-09 NOTE — Consult Note (Signed)
Chief Complaint:  Subjective/Chief Complaint Patietn seen for Dr Allen Norris, re hematochezia.  Patietn doing well, no repeat bleeding s/p microembolization.  Denies n.v or abdominal pain, tolerating full liquid diet.   VITAL SIGNS/ANCILLARY NOTES: **Vital Signs.:   22-Jun-14 09:40  Vital Signs Type Q 4hr  Temperature Temperature (F) 99.8  Celsius 37.6  Temperature Source oral  Pulse Pulse 73  Respirations Respirations 18  Systolic BP Systolic BP 026  Diastolic BP (mmHg) Diastolic BP (mmHg) 76  Mean BP 100  Pulse Ox % Pulse Ox % 98  Pulse Ox Activity Level  At rest  Oxygen Delivery Room Air/ 21 %    13:47  Vital Signs Type Routine  Temperature Temperature (F) 100  Celsius 37.7  Temperature Source oral  Pulse Pulse 89  Respirations Respirations 18  Systolic BP Systolic BP 378  Diastolic BP (mmHg) Diastolic BP (mmHg) 76  Mean BP 103  Pulse Ox % Pulse Ox % 98  Pulse Ox Activity Level  At rest  Oxygen Delivery Room Air/ 21 %   Brief Assessment:  Cardiac Regular   Respiratory clear BS   Gastrointestinal details normal Soft  Nontender  Nondistended  Bowel sounds normal   Lab Results: Routine Chem:  22-Jun-14 04:52   Glucose, Serum 72  BUN  6  Creatinine (comp) 0.79  Sodium, Serum  147  Potassium, Serum  3.1  Chloride, Serum  112  CO2, Serum 28  Calcium (Total), Serum  8.0  Anion Gap 7  Osmolality (calc) 289  eGFR (African American) >60  eGFR (Non-African American) >60 (eGFR values <41m/min/1.73 m2 may be an indication of chronic kidney disease (CKD). Calculated eGFR is useful in patients with stable renal function. The eGFR calculation will not be reliable in acutely ill patients when serum creatinine is changing rapidly. It is not useful in  patients on dialysis. The eGFR calculation may not be applicable to patients at the low and high extremes of body sizes, pregnant women, and vegetarians.)  Routine Hem:  17-Jun-14 00:41   Hemoglobin (CBC)  12.1    09:48    Hemoglobin (CBC)  8.2 (Result(s) reported on 03 Jan 2013 at 10:23AM.)    17:05   Hemoglobin (CBC)  7.6 (Result(s) reported on 03 Jan 2013 at 05:53PM.)  18-Jun-14 04:34   Hemoglobin (CBC)  7.4    19:59   Hemoglobin (CBC)  8.5 (Result(s) reported on 04 Jan 2013 at 08:07PM.)  19-Jun-14 04:23   Hemoglobin (CBC)  5.9    15:01   Hemoglobin (CBC)  8.7 (Result(s) reported on 05 Jan 2013 at 03:22PM.)  20-Jun-14 07:41   Hemoglobin (CBC)  7.4  21-Jun-14 04:51   Hemoglobin (CBC)  6.6  22-Jun-14 04:52   WBC (CBC) 7.9  RBC (CBC)  2.71  Hemoglobin (CBC)  8.2  Hematocrit (CBC)  23.8  Platelet Count (CBC)  111  MCV 88  MCH 30.4  MCHC 34.5  RDW  15.0  Neutrophil % 68.2  Lymphocyte % 20.7  Monocyte % 9.3  Eosinophil % 1.3  Basophil % 0.5  Neutrophil # 5.4  Lymphocyte # 1.6  Monocyte # 0.7  Eosinophil # 0.1  Basophil # 0.0 (Result(s) reported on 08 Jan 2013 at 06:01AM.)   Assessment/Plan:  Assessment/Plan:  Assessment 1) hematochezia-not recurrent s/p microembolization of possible mid to right colon lesion.  Colonoscopy results noted.  Patietn had 2 unit prbc transfused after drop of hgb without evidence of repeat bleeding.   Plan 1) continue observation, repeat hgb in  am.  Dr Allen Norris to be back tomorrow.   Electronic Signatures: Loistine Simas (MD)  (Signed 22-Jun-14 16:53)  Authored: Chief Complaint, VITAL SIGNS/ANCILLARY NOTES, Brief Assessment, Lab Results, Assessment/Plan   Last Updated: 22-Jun-14 16:53 by Loistine Simas (MD)

## 2014-11-09 NOTE — H&P (Signed)
PATIENT NAME:  [name], [name] MR#:  [id number] DATE OF BIRTH:  [birth year]  DATE OF ADMISSION:  01/03/2013  PRIMARY CARE PHYSICIAN:  Suzan Garibaldi, MD  REFERRING PHYSICIAN: Chiquita Loth, MD   CHIEF COMPLAINT: Passed out.   HISTORY OF PRESENT ILLNESS: [name] is a 64 year old African American male who lives in a group home carrying a diagnosis of bipolar disorder. He is brought to the Emergency Department after having an episode of syncope. The patient heard a noise of the patient's fall. The patient was responsive at that time. Concerning this, the patient was brought to the Emergency Department.  Initially the patient's blood pressure was 70/40. The patient responded well to the IV fluids, normal saline 1 liter, with improvement of the systolic blood pressure to 110. Additional CT head without contrast was unremarkable. While the patient was in the CT scan, the patient rolled back eyes; however, when prompted the patient responded appropriately. No obvious signs of infection are found. UA is negative for nitrites and leukocyte esterase. Urine drug screen is negative.  Initial set of cardiac enzymes are negative. The patient is found to have mild elevation of the WBC of 12.4. Chest x-ray is unremarkable. The oxygen saturations are well within normal limits on room air.   PAST MEDICAL HISTORY:  1.  Hyperlipidemia.  2.  Stress urgency.  3.  Obstructive sleep apnea.  4.  COPD.  5.  Bipolar disorder.  6.  Schizophrenia.  7.  Diabetes mellitus.   ALLERGIES: No known drug allergies.   HOME MEDICATIONS: 1.  Tamsulosin 0.4 mg once a day.  2.  Simvastatin 20 mg once a day.  3.  Risperidone 4 mg once a day.  4.  Oxybutynin 1 tablet 2 times a day.  5.  Lorazepam 1 mg 2 times a day.  6.  Lisinopril 20 mg once a day.  7.  Citalopram 40 mg once a day. 8.  Chlorpromazine 100 mg 3 tablets once a day at bedtime.  9.  Aspirin 81 mg daily.  10.  Amlodipine 5 mg once a day.   SOCIAL HISTORY: Smokes 1  pack a day. Denies drinking alcohol or using illicit drugs. Lives in a group home. Could not tell about family and social situation.  FAMILY HISTORY: Could not be obtained from the patient.  REVIEW OF SYSTEMS:  Difficult to obtain from the patient as the patient seems to have quite low IQ.   PHYSICAL EXAMINATION: GENERAL: This is a well-built, well-nourished, age-appropriate male lying down in the bed, not in distress.  VITAL SIGNS: Temperature 98.8, pulse 86, blood pressure 110/61, respiratory rate of 18 and oxygen saturation is 98% on room air.  HEENT: Head normocephalic, atraumatic. There is no sclerae icterus. Conjunctivae normal. Pupils equal and react to light. Extraocular movements are intact. Mucous membranes are moist.  NECK: Supple. No lymphadenopathy. No JVD. No carotid bruit. No thyromegaly.  CHEST: Has no focal tenderness.  LUNGS: Bilaterally clear to auscultation.  HEART: S1, S2 regular. No murmurs are heard. No pedal edema. Pulses 2+.  ABDOMEN: Bowel sounds present. Soft, nontender and nondistended. No hepatosplenomegaly.  SKIN: No rash or lesions.  MUSCULOSKELETAL: Good range of motion in all the extremities.  NEUROLOGIC: The patient alert and oriented to self. Did not answer the place and time. No apparent cranial nerve abnormalities. Motor is 5/5 in upper and lower extremities.   LABORATORY AND DIAGNOSTICS: CBC: WBC of 12.4, hemoglobin 12.1 and platelet count of 113. Troponin less than 0.02. TSH  1.32. CK 246 and CK-MB 2.48. UA negative for nitrites and leukocyte esterase. Urine drug screen is negative.   EKG 12-lead: Normal sinus rhythm with no ST-T wave abnormalities.   ASSESSMENT AND PLAN: [name] is a 64 year old male who is brought to the Emergency Department after having an episode of syncope, hypotension.  1.  Syncope. Differential diagnoses acute coronary syndrome, possibly arrhythmias, transient ischemic attack. Admit the patient to the monitored bed, cycle  cardiac enzymes x 3, obtain echocardiogram, MRI of the brain, carotid Dopplers.  Keep the patient on aspirin 325 mg daily. Obtain lipid profile.  2.  Bipolar disorder. Continue the home medications.  3.  Hypertension. Currently well controlled. Continue the home medications.  4.  Continued tobacco use. The patient does not seem to have insight.  5.  Keep the patient on deep vein thrombosis prophylaxis with Lovenox.   TIME SPENT: 45 minutes. ____________________________ Susa GriffinsPadmaja Antwion Carpenter, MD pv:sb D: 01/03/2013 07:10:12 ET T: 01/03/2013 08:01:12 ET JOB#: [id number]  cc: Susa GriffinsPadmaja Mahki Spikes, MD, <Dictator> Suzan Garibaldiornelio Flores, MD Susa GriffinsPADMAJA Keishia Ground MD ELECTRONICALLY SIGNED 01/07/2013 14:41

## 2014-11-09 NOTE — Consult Note (Signed)
Brief Consult Note: Diagnosis: This patient was admitted with syncope and then had a large bloody bowel movement. He deneis any abd pain. He is not a great historian and is bipolar.   Patient was seen by consultant.   Consult note dictated.   Discussed with Attending MD.   Comments: This patient is a 64 year old man with marroon stools and likely a lower GI bleed although an upper GI bleed can present like this.  Patient to get aq bleeding scan today.  Electronic Signatures: Midge MiniumWohl, Parris Cudworth (MD)  (Signed 17-Jun-14 13:56)  Authored: Brief Consult Note   Last Updated: 17-Jun-14 13:56 by Midge MiniumWohl, Terran Klinke (MD)

## 2014-11-09 NOTE — Consult Note (Signed)
PATIENT NAME:  [name], [name] MR#:  [id number] DATE OF BIRTH:  21-Nov-1949  DATE OF CONSULTATION:  01/03/2013  REFERRING PHYSICIAN:  Dr. Juliene PinaMody. CONSULTING PHYSICIAN:  Annice NeedyJason S. Dax Murguia, MD  REASON FOR CONSULTATION: Gastrointestinal bleed.  PRIMARY CARE PHYSICIAN: Dr. Suzan Garibaldiornelio Flores.  CHIEF COMPLAINT: Rectal bleeding.  HISTORY OF PRESENT ILLNESS: This is a 64 year old male, who lives in a group home due to bipolar disorder. He is brought to the Emergency Room after having a syncopal episode. He was hypotensive and pressures as low as 70/40. He is a poor historian, but apparently makes his own medical decisions. The leader of his group home is actually here with him today and she did help provide some of the history. He has apparently been having bright blood per rectum for the last day or 2. He had a syncopal episode that prompted his visit to the Emergency Room today. He has had a bleeding scan, which has been performed, which shows active bleeding in the ascending colon and hepatic flexure area. He does not really have abdominal pain and no previous history of bleeding per rectum.  PAST MEDICAL HISTORY:  1.  Hyperlipidemia.  2.  Bipolar disorder/schizophrenia.  3.  Diabetes.  4.  COPD.  5.  Obstructive sleep apnea.   ALLERGIES: None.   HOME MEDICATIONS: Include: 1.  Flomax 0.4 mg daily.  2.  Simvastatin 20 mg daily.  3.  Risperidone 4 mg daily.  4.  Oxybutynin 1 tablet b.i.d.  5.  Lorazepam 1 mg b.i.d.  6.  Lisinopril 20 mg daily.  7.  Citalopram 40 mg daily.  8.  Chlorpromazine 300 mg at bedtime.  9.  Aspirin 81 mg daily.  10. Norvasc 5 mg daily.   SOCIAL HISTORY: He smokes a pack per day. No alcohol or drug use. He lives in group home.   FAMILY HISTORY: Really not obtainable.  REVIEW OF SYSTEMS: Very difficult to obtain due to him being quite the poor historian. Per the leader of the group home, there was no obvious fevers or chills, complaints of pain or other problems prior to  this episode.   PHYSICAL EXAMINATION:  GENERAL: This is a well-built, well-nourished, African American male lying in bed, not in apparent distress. VITAL SIGNS: He is afebrile. His pulse is around 100. His blood pressure is currently about 130/70, respiratory rate 20 with saturations greater than 97%.  HEENT: Normocephalic and atraumatic. Sclerae are anicteric. Conjunctivae are clear.  NECK: Supple without adenopathy or JVD.  HEART: Slightly tachycardic, but regular. No murmur.  LUNGS: Clear bilaterally with no use of accessory muscles.  ABDOMEN: Soft, nondistended, nontender.  SKIN: Warm and dry without ulcers.  NEUROLOGIC: Normal strength and tone in all 4 extremities. PSYCH: Somewhat difficult to obtain history due to his low IQ, but he does respond appropriately to some questioning. MUSCULOSKELETAL: No lower extremity edema. Normal pedal pulses.   LABORATORY EVALUATIONS: Reveal a hemoglobin of 8.2, previously it was 12. His white count on admission was 12.4. His platelet count is 113,000. UDS is negative. CK and troponin are negative. Sodium is 138, potassium 3.6, chloride 106, CO2 26, BUN is 10, creatinine is 1.09 and glucose is 136.   IMAGING STUDIES: Bleeding scan as described above.   ASSESSMENT AND PLAN: This is a 64 year old [place] male with, what appears to be, active gastrointestinal bleeding. This has slowed. On questioning the nurse, he has only had one bowel movement this afternoon and he has become hemodynamically stable. I do think  that embolization would be an option. It is a bit difficult to discuss the risks and benefits to the patient, but I did discuss these and apparently he does sign consent. I also discussed these with the leader of the group home. Given his overall findings and as long as he remains stable, it may be prudent to see if this will stop on its own and avoid any procedures; however, we will be ready to perform an embolization if ongoing bleeding or  other issues become a problem.   This is a level 4 consultation.  ____________________________ Annice Needy, MD jsd:aw D: 01/04/2013 09:39:59 ET T: 01/04/2013 10:15:57 ET JOB#: [id number]  cc: Annice Needy, MD, <Dictator> Annice Needy MD ELECTRONICALLY SIGNED 01/12/2013 14:43

## 2014-11-09 NOTE — Consult Note (Signed)
PATIENT NAME:  [name], [name] MR#:  [id number] DATE OF BIRTH:  Dec 10, 1949  DATE OF CONSULTATION:  01/05/2013  ATTENDING AND CONSULTING PHYSICIAN:  Cristal Deerhristopher A. Cala Kruckenberg, MD  REASON FOR CONSULT: History of syncope, gastrointestinal  bleed probable right colon.   HISTORY OF PRESENT ILLNESS: [name] is a pleasant 64 year old male with history of bipolar disorder and schizophrenia who presented with hypotension and syncope. He was brought into the ED and found to have mild elevation.  He was noted to have bloody bowel movements, and according to him for the last 2 or 3 months he had a bleeding scan which was significant for possible bleed in the right colon. He had an angiography, which he was coiled in the right colic and hepatic flexure branches; however, no obvious blush was noted.  His H and H did drop this morning, but he has not had any bloody bowel movements since, otherwise, no fevers, chills, night sweats, shortness of breath, cough, chest pain. He does have left upper quadrant pain, nausea, vomiting, diarrhea, constipation, dysuria, hematuria   PAST MEDICAL HISTORY:  1.  Gastrointestinal bleed status post coil embolization. 2.  Hyperlipidemia.  3.  Stress urgency.  4.  OSA.  6.  COPD. 7.  Schizophrenia.  8.  Bipolar disorder.  9.  Diabetes mellitus.   ALLERGIES: No known drug allergies.   HOME MEDICATIONS:  Tamsulosin, simvastatin, risperidone, oxybutynin, lorazepam, lisinopril, citalopram, chlorpromazine, aspirin and amlodipine.   SOCIAL HISTORY: Smokes a pack a day. Denies alcohol or drugs. Lives in a group home.   FAMILY HISTORY: Unable to obtain.  REVIEW OF SYSTEMS: Pertinent positives and negatives as above but difficulty due to  patient mental status, which I think is baseline.  PHYSICAL EXAMINATION:  VITAL SIGNS: Temperature 98.6, pulse 82, blood pressure 169/59, respirations 14, 97% on room air.  GENERAL: No acute distress. Alert and oriented x 3.  HEAD:  Normocephalic/atraumatic Eyes: No scleral icterus. No conjunctivitis.  Face:  No obvious facial trauma. Normal external nose. Normal external ears   HEART: Regular rate and rhythm, no murmurs, rubs or gallops.  LUNGS: Clear to auscultation, moving air well.  ABDOMEN: Soft. Mild tender in left upper quadrant, nondistended.  EXTREMITIES: Moves all extremities well. Strength 5/5.  NEUROLOGIC: Cranial nerves II through XII grossly intact.  Sensation intact all 4 extremities.   LABORATORY AND DIAGNOSTIC DATA:  Current labs are white cell count 17.9, hemoglobin 5.7269m down from 8.5, down from 12.1 on admission. Platelets are 105.  Creatinine is 1.09.   Tagged red blood cell scan does show concern for active extravasation in area of ascending colon and hepatic flexure.   ASSESSMENT AND PLAN: [name] is a pleasant 64 year old with what sounds like a GI bleed who underwent angioembolization, does not appear to be currently bleeding this shift, but  concern per conversation with Dr. Wyn Quakerew of hypervascularity around the splenic flexure. I have talked to Dr. Servando SnareWohl, who, if the patient is stable, will consider EGD and colonoscopy to try to  localize what this bleeding lesion is; but a continuous bleed will require hemicolectomy. We will ensure that this is not a component of upper GI bleed either.  I have spoken with [name], and he agrees with this plan.   ____________________________ Si Raiderhristopher A. Majesty Stehlin, MD cal:cb D: 01/05/2013 12:23:30 ET T: 01/05/2013 15:06:26 ET JOB#: [id number]  cc: Cristal Deerhristopher A. Ardith Lewman, MD, <Dictator> Jarvis NewcomerHRISTOPHER A Marque Bango MD ELECTRONICALLY SIGNED 01/05/2013 17:03

## 2015-08-28 ENCOUNTER — Ambulatory Visit (INDEPENDENT_AMBULATORY_CARE_PROVIDER_SITE_OTHER): Payer: Medicare HMO | Admitting: Family Medicine

## 2015-08-28 ENCOUNTER — Encounter: Payer: Self-pay | Admitting: Family Medicine

## 2015-08-28 ENCOUNTER — Other Ambulatory Visit: Payer: Self-pay

## 2015-08-28 VITALS — BP 118/82 | HR 85 | Temp 98.1°F | Resp 16 | Ht 73.0 in | Wt 190.8 lb

## 2015-08-28 DIAGNOSIS — E785 Hyperlipidemia, unspecified: Secondary | ICD-10-CM | POA: Diagnosis not present

## 2015-08-28 DIAGNOSIS — F17200 Nicotine dependence, unspecified, uncomplicated: Secondary | ICD-10-CM

## 2015-08-28 DIAGNOSIS — I10 Essential (primary) hypertension: Secondary | ICD-10-CM | POA: Diagnosis not present

## 2015-08-28 DIAGNOSIS — K219 Gastro-esophageal reflux disease without esophagitis: Secondary | ICD-10-CM

## 2015-08-28 DIAGNOSIS — N400 Enlarged prostate without lower urinary tract symptoms: Secondary | ICD-10-CM | POA: Diagnosis not present

## 2015-08-28 DIAGNOSIS — F319 Bipolar disorder, unspecified: Secondary | ICD-10-CM

## 2015-08-28 DIAGNOSIS — Z8719 Personal history of other diseases of the digestive system: Secondary | ICD-10-CM | POA: Diagnosis not present

## 2015-08-28 DIAGNOSIS — F209 Schizophrenia, unspecified: Secondary | ICD-10-CM

## 2015-08-28 DIAGNOSIS — G479 Sleep disorder, unspecified: Secondary | ICD-10-CM | POA: Insufficient documentation

## 2015-08-28 DIAGNOSIS — J449 Chronic obstructive pulmonary disease, unspecified: Secondary | ICD-10-CM

## 2015-08-28 DIAGNOSIS — Z87898 Personal history of other specified conditions: Secondary | ICD-10-CM | POA: Diagnosis not present

## 2015-08-28 DIAGNOSIS — Z720 Tobacco use: Secondary | ICD-10-CM | POA: Diagnosis not present

## 2015-08-28 DIAGNOSIS — Z8669 Personal history of other diseases of the nervous system and sense organs: Secondary | ICD-10-CM

## 2015-08-28 DIAGNOSIS — Z8639 Personal history of other endocrine, nutritional and metabolic disease: Secondary | ICD-10-CM

## 2015-08-28 MED ORDER — LISINOPRIL 20 MG PO TABS: 20.0000 mg | ORAL_TABLET | Freq: Every day | ORAL | Status: DC

## 2015-08-28 MED ORDER — SIMVASTATIN 20 MG PO TABS: 20.0000 mg | ORAL_TABLET | Freq: Every day | ORAL | Status: DC

## 2015-08-28 MED ORDER — AMLODIPINE BESYLATE 10 MG PO TABS: 10.0000 mg | ORAL_TABLET | Freq: Every day | ORAL | Status: DC

## 2015-08-28 MED ORDER — PANTOPRAZOLE SODIUM 40 MG PO TBEC: 40.0000 mg | DELAYED_RELEASE_TABLET | Freq: Every day | ORAL | Status: DC

## 2015-08-28 MED ORDER — TAMSULOSIN HCL 0.4 MG PO CAPS: 0.4000 mg | ORAL_CAPSULE | Freq: Every day | ORAL | Status: DC

## 2015-08-28 NOTE — Progress Notes (Signed)
Date:  08/28/2015   Name:  [name]   DOB:  September 03, 1949   MRN:  [id number]  PCP:  No primary care provider on file.    Chief Complaint: Establish Care   History of Present Illness:  This is a 65 y.o. male group home resident to establish care. Has seen [facility] in past but they apparently no longer take his insurance. Long hx schizophrenia and bipolar d/o, currently well controlled on multiple psych meds. Appetite good. Hx LGI bleed in April requiring hepatic flexure embolization, gastritis and anemia also noted at that time, on chronic PPI. Hx HLD on Zocor, no recent lipids. Hx OSA but on no current rx. Hx COPD, heavy smoker. Unclear hx DM. Hx BPH well controlled on Flomax with nocturia x 2. Tetanus and pneumo imm status unknown, aide will try to locate.  Review of Systems:  Review of Systems  Constitutional: Negative for fever, fatigue and unexpected weight change.  HENT: Negative for ear pain and sore throat.   Eyes: Negative for pain.  Respiratory: Negative for cough and shortness of breath.   Cardiovascular: Negative for chest pain and leg swelling.  Gastrointestinal: Negative for abdominal pain.  Endocrine: Negative for polyuria.  Genitourinary: Negative for difficulty urinating.  Neurological: Negative for syncope and light-headedness.    Patient Active Problem List   Diagnosis Date Noted  . Hypertension 08/28/2015  . GERD (gastroesophageal reflux disease) 08/28/2015  . Hyperlipidemia 08/28/2015  . BPH (benign prostatic hyperplasia) 08/28/2015  . Hx of sleep apnea 08/28/2015  . COPD (chronic obstructive pulmonary disease) (HCC) 08/28/2015  . Smoker 08/28/2015  . Hx of lower gastrointestinal bleeding 08/28/2015  . Hx of gastritis 08/28/2015  . Bipolar 1 disorder (HCC) 08/28/2015  . Schizophrenia (HCC) 08/28/2015  . Hx of diabetes mellitus 08/28/2015    Prior to Admission medications   Medication Sig Start Date End Date Taking? Authorizing Provider   amLODipine (NORVASC) 10 MG tablet Take 1 tablet (10 mg total) by mouth daily. 08/28/15  Yes Schuyler Amor, MD  citalopram (CELEXA) 40 MG tablet Take 40 mg by mouth daily.   Yes Historical Provider, MD  fluPHENAZine (PROLIXIN) 5 MG tablet Take 1 tablet by mouth 2 (two) times daily. 08/21/15  Yes Historical Provider, MD  lisinopril (PRINIVIL,ZESTRIL) 20 MG tablet Take 1 tablet (20 mg total) by mouth daily. 08/28/15  Yes Schuyler Amor, MD  LORazepam (ATIVAN) 1 MG tablet Take 0.5 mg by mouth 2 (two) times daily.   Yes Historical Provider, MD  pantoprazole (PROTONIX) 40 MG tablet Take 1 tablet (40 mg total) by mouth daily. 08/28/15  Yes Schuyler Amor, MD  risperiDONE (RISPERDAL) 1 MG tablet Take 4 mg by mouth at bedtime.   Yes Historical Provider, MD  simvastatin (ZOCOR) 20 MG tablet Take 1 tablet (20 mg total) by mouth daily. 08/28/15  Yes Schuyler Amor, MD  tamsulosin (FLOMAX) 0.4 MG CAPS capsule Take 1 capsule (0.4 mg total) by mouth daily after supper. 08/28/15  Yes Schuyler Amor, MD  traZODone (DESYREL) 50 MG tablet Take 50 mg by mouth at bedtime.   Yes Historical Provider, MD    No Known Allergies  History reviewed. No pertinent past surgical history.  Social History  Substance Use Topics  . Smoking status: Never Smoker   . Smokeless tobacco: Never Used  . Alcohol Use: No    Family History  Problem Relation Age of Onset  . Family history unknown: Yes    Medication list has been reviewed and updated.  Physical Examination: BP 118/82 mmHg  Pulse 85  Temp(Src) 98.1 F (36.7 C)  Resp 16  Ht  (1.854 m)  Wt 190 lb 12.8 oz (86.546 kg)  BMI 25.18 kg/m2  SpO2 98%  Physical Exam  Constitutional: He appears well-developed and well-nourished.  HENT:  Head: Normocephalic and atraumatic.  Right Ear: External ear normal.  Left Ear: External ear normal.  Nose: Nose normal.  Mouth/Throat: Oropharynx is clear and moist.  TM's clear  Eyes: Conjunctivae and EOM are normal. Pupils are equal,  round, and reactive to light.  Neck: Neck supple. No thyromegaly present.  Cardiovascular: Normal rate, regular rhythm and normal heart sounds.   Pulmonary/Chest: Effort normal and breath sounds normal.  Abdominal: Soft. He exhibits no distension and no mass. There is no tenderness.  Musculoskeletal: He exhibits no edema.  Lymphadenopathy:    He has no cervical adenopathy.  Neurological: He is alert.  Romberg negative, gait wide-based, no obvious dyskinesia  Skin: Skin is warm and dry.  Psychiatric: He has a normal mood and affect.  Quiet, speaks slowly  Nursing note and vitals reviewed.   Assessment and Plan:  1. Essential hypertension Well controlled, refill lisinopril/amlodipine for now, may not need both - Comprehensive Metabolic Panel (CMET) - CBC  2. Gastroesophageal reflux disease, esophagitis presence not specified With hx gastritis and LGI bleed, refill Protonix for now, consider taper - B12  3. BPH (benign prostatic hyperplasia) Well controlled per aide, refill Flomax - Vitamin D (25 hydroxy)  4. Hyperlipidemia Unclear control, refill Zocor for now - Lipid Profile  5. Chronic obstructive pulmonary disease, unspecified COPD type (HCC) Stable on no meds, monitor  6. Smoker Unlikely to quit given psych issues  7. Bipolar 1 disorder (HCC) - Ambulatory referral to Psychiatry  8. Schizophrenia, unspecified type (HCC) - Ambulatory referral to Psychiatry  9. Hx of sleep apnea On no rx, asymptomatic at present, may need reeval - TSH  10. Hx of lower gastrointestinal bleeding  11. Hx of gastritis  12. Hx of diabetes mellitus - HgB A1c  Return in about 4 weeks (around 09/25/2015).  Dionne Ano. Kingsley Spittle MD [facility]  08/28/2015

## 2015-08-29 ENCOUNTER — Other Ambulatory Visit: Payer: Self-pay | Admitting: Family Medicine

## 2015-08-29 ENCOUNTER — Encounter: Payer: Self-pay | Admitting: Family Medicine

## 2015-08-29 ENCOUNTER — Telehealth: Payer: Self-pay

## 2015-08-29 DIAGNOSIS — E559 Vitamin D deficiency, unspecified: Secondary | ICD-10-CM | POA: Insufficient documentation

## 2015-08-29 DIAGNOSIS — R7303 Prediabetes: Secondary | ICD-10-CM | POA: Insufficient documentation

## 2015-08-29 LAB — HEMOGLOBIN A1C
ESTIMATED AVERAGE GLUCOSE: 120 mg/dL
Hgb A1c MFr Bld: 5.8 % — ABNORMAL HIGH (ref 4.8–5.6)

## 2015-08-29 LAB — CBC
HEMOGLOBIN: 15.1 g/dL (ref 12.6–17.7)
Hematocrit: 43.8 % (ref 37.5–51.0)
MCH: 31.5 pg (ref 26.6–33.0)
MCHC: 34.5 g/dL (ref 31.5–35.7)
MCV: 91 fL (ref 79–97)
PLATELETS: 129 10*3/uL — AB (ref 150–379)
RBC: 4.79 x10E6/uL (ref 4.14–5.80)
RDW: 14.2 % (ref 12.3–15.4)
WBC: 7.7 10*3/uL (ref 3.4–10.8)

## 2015-08-29 LAB — COMPREHENSIVE METABOLIC PANEL
A/G RATIO: 1.4 (ref 1.1–2.5)
ALT: 10 IU/L (ref 0–44)
AST: 12 IU/L (ref 0–40)
Albumin: 4.4 g/dL (ref 3.6–4.8)
Alkaline Phosphatase: 99 IU/L (ref 39–117)
BILIRUBIN TOTAL: 0.7 mg/dL (ref 0.0–1.2)
BUN / CREAT RATIO: 13 (ref 10–22)
BUN: 11 mg/dL (ref 8–27)
CHLORIDE: 99 mmol/L (ref 96–106)
CO2: 23 mmol/L (ref 18–29)
Calcium: 9.7 mg/dL (ref 8.6–10.2)
Creatinine, Ser: 0.84 mg/dL (ref 0.76–1.27)
GFR calc non Af Amer: 92 mL/min/{1.73_m2} (ref >59)
GFR, EST AFRICAN AMERICAN: 106 mL/min/{1.73_m2} (ref >59)
Globulin, Total: 3.1 g/dL (ref 1.5–4.5)
Glucose: 59 mg/dL — ABNORMAL LOW (ref 65–99)
POTASSIUM: 4.3 mmol/L (ref 3.5–5.2)
SODIUM: 142 mmol/L (ref 134–144)
TOTAL PROTEIN: 7.5 g/dL (ref 6.0–8.5)

## 2015-08-29 LAB — VITAMIN B12: VITAMIN B 12: 478 pg/mL (ref 211–946)

## 2015-08-29 LAB — LIPID PANEL
CHOL/HDL RATIO: 3.1 ratio (ref 0.0–5.0)
CHOLESTEROL TOTAL: 124 mg/dL (ref 100–199)
HDL: 40 mg/dL (ref >39)
LDL CALC: 71 mg/dL (ref 0–99)
TRIGLYCERIDES: 65 mg/dL (ref 0–149)
VLDL CHOLESTEROL CAL: 13 mg/dL (ref 5–40)

## 2015-08-29 LAB — TSH: TSH: 0.593 u[IU]/mL (ref 0.450–4.500)

## 2015-08-29 LAB — VITAMIN D 25 HYDROXY (VIT D DEFICIENCY, FRACTURES): Vit D, 25-Hydroxy: 18.2 ng/mL — ABNORMAL LOW (ref 30.0–100.0)

## 2015-08-29 MED ORDER — VITAMIN D 50 MCG (2000 UT) PO CAPS: 1.0000 | ORAL_CAPSULE | Freq: Every day | ORAL | Status: DC

## 2015-08-29 NOTE — Telephone Encounter (Signed)
Left message for patient to call back  

## 2015-08-29 NOTE — Telephone Encounter (Signed)
-----   Message from Schuyler Amor, MD sent at 08/29/2015  9:08 AM EST ----- Inform blood work shows prediabetes and low vit D level, recommend begin OTC vit D3 2000 units daily.

## 2015-09-04 NOTE — Telephone Encounter (Signed)
I have mailed a letter to patient advising to call our office and update phone number.

## 2015-09-26 ENCOUNTER — Ambulatory Visit: Payer: Medicare HMO | Admitting: Family Medicine

## 2015-09-26 ENCOUNTER — Ambulatory Visit (INDEPENDENT_AMBULATORY_CARE_PROVIDER_SITE_OTHER): Payer: Medicare HMO | Admitting: Family Medicine

## 2015-09-26 ENCOUNTER — Telehealth: Payer: Self-pay

## 2015-09-26 ENCOUNTER — Encounter: Payer: Self-pay | Admitting: Family Medicine

## 2015-09-26 VITALS — BP 138/78 | HR 80 | Temp 97.9°F | Resp 16 | Ht 73.0 in | Wt 195.0 lb

## 2015-09-26 DIAGNOSIS — E559 Vitamin D deficiency, unspecified: Secondary | ICD-10-CM | POA: Diagnosis not present

## 2015-09-26 DIAGNOSIS — F319 Bipolar disorder, unspecified: Secondary | ICD-10-CM

## 2015-09-26 DIAGNOSIS — J449 Chronic obstructive pulmonary disease, unspecified: Secondary | ICD-10-CM

## 2015-09-26 DIAGNOSIS — E785 Hyperlipidemia, unspecified: Secondary | ICD-10-CM | POA: Diagnosis not present

## 2015-09-26 DIAGNOSIS — R7303 Prediabetes: Secondary | ICD-10-CM | POA: Diagnosis not present

## 2015-09-26 DIAGNOSIS — I10 Essential (primary) hypertension: Secondary | ICD-10-CM | POA: Diagnosis not present

## 2015-09-26 DIAGNOSIS — Z720 Tobacco use: Secondary | ICD-10-CM | POA: Diagnosis not present

## 2015-09-26 DIAGNOSIS — F209 Schizophrenia, unspecified: Secondary | ICD-10-CM

## 2015-09-26 DIAGNOSIS — N400 Enlarged prostate without lower urinary tract symptoms: Secondary | ICD-10-CM | POA: Diagnosis not present

## 2015-09-26 DIAGNOSIS — F17200 Nicotine dependence, unspecified, uncomplicated: Secondary | ICD-10-CM

## 2015-09-26 MED ORDER — VITAMIN D 50 MCG (2000 UT) PO CAPS: 1.0000 | ORAL_CAPSULE | Freq: Every day | ORAL | Status: DC

## 2015-09-26 NOTE — Telephone Encounter (Signed)
Caseworker called to see if there can be an order for VIt D3. I saw it on list but it was not on their med list as being given so I removed it and she said they do want him to take,

## 2015-09-26 NOTE — Progress Notes (Signed)
Date:  09/26/2015   Name:  [name]   DOB:  [birth year]   MRN:  [id number]  PCP:  No primary care provider on file.    Chief Complaint: Hypertension   History of Present Illness:  This is a 65 y.o. male seen in one month f/u. No new concerns. Blood work last visit showed prediabetes and vit D def only. Has not yet started vit D supplement. Aide unable to find imm records.   Review of Systems:  Review of Systems  Constitutional: Negative for fever and fatigue.  Respiratory: Negative for shortness of breath.   Cardiovascular: Negative for chest pain and leg swelling.  Endocrine: Negative for polyuria.  Genitourinary: Negative for difficulty urinating.  Neurological: Negative for syncope and light-headedness.    Patient Active Problem List   Diagnosis Date Noted  . Vitamin D deficiency 08/29/2015  . Prediabetes 08/29/2015  . Hypertension 08/28/2015  . GERD (gastroesophageal reflux disease) 08/28/2015  . Hyperlipidemia 08/28/2015  . BPH (benign prostatic hyperplasia) 08/28/2015  . Hx of sleep apnea 08/28/2015  . COPD (chronic obstructive pulmonary disease) (HCC) 08/28/2015  . Smoker 08/28/2015  . Hx of lower gastrointestinal bleeding 08/28/2015  . Hx of gastritis 08/28/2015  . Bipolar 1 disorder (HCC) 08/28/2015  . Schizophrenia (HCC) 08/28/2015    Prior to Admission medications   Medication Sig Start Date End Date Taking? Authorizing Provider  amLODipine (NORVASC) 10 MG tablet Take 1 tablet (10 mg total) by mouth daily. 08/28/15  Yes Schuyler AmorWilliam Gissele Narducci, MD  citalopram (CELEXA) 40 MG tablet Take 40 mg by mouth daily.   Yes Historical Provider, MD  fluPHENAZine (PROLIXIN) 5 MG tablet Take 1 tablet by mouth 2 (two) times daily. 08/21/15  Yes Historical Provider, MD  lisinopril (PRINIVIL,ZESTRIL) 20 MG tablet Take 1 tablet (20 mg total) by mouth daily. 08/28/15  Yes Schuyler AmorWilliam Lummie Montijo, MD  LORazepam (ATIVAN) 1 MG tablet Take 0.5 mg by mouth 2 (two) times daily.   Yes Historical Provider, MD   simvastatin (ZOCOR) 20 MG tablet Take 1 tablet (20 mg total) by mouth daily. 08/28/15  Yes Schuyler AmorWilliam Ireoluwa Grant, MD  tamsulosin (FLOMAX) 0.4 MG CAPS capsule Take 1 capsule (0.4 mg total) by mouth daily after supper. 08/28/15  Yes Schuyler AmorWilliam Edmonia Gonser, MD  traZODone (DESYREL) 50 MG tablet Take 50 mg by mouth at bedtime.   Yes Historical Provider, MD    No Known Allergies  No past surgical history on file.  Social History  Substance Use Topics  . Smoking status: Never Smoker   . Smokeless tobacco: Never Used  . Alcohol Use: No    Family History  Problem Relation Age of Onset  . Family history unknown: Yes    Medication list has been reviewed and updated.  Physical Examination: BP 138/78 mmHg  Pulse 80  Temp(Src) 97.9 F (36.6 C)  Resp 16  Ht 6\' 1"  (1.854 m)  Wt 195 lb (88.451 kg)  BMI 25.73 kg/m2  SpO2 95%  Physical Exam  Constitutional: He appears well-developed and well-nourished.  Cardiovascular: Normal rate, regular rhythm and normal heart sounds.   Pulmonary/Chest: Effort normal and breath sounds normal.  Musculoskeletal: He exhibits no edema.  Neurological: He is alert.  Skin: Skin is warm and dry.  Psychiatric: He has a normal mood and affect. His behavior is normal.  Nursing note and vitals reviewed.   Assessment and Plan:  1. Essential hypertension Well controlled on lisinopril/amlodipine  2. BPH (benign prostatic hyperplasia) Well controlled on Flomax  3. Hyperlipidemia Well  controlled on Zocor, unclear indication for use, consider d/c next visit  4. Chronic obstructive pulmonary disease, unspecified COPD type (HCC) Per history, well controlled off meds  5. Smoker Chronic, unlikely to quit given psych history  6. Prediabetes Monitor a1c  7. Vitamin D deficiency Begin OTC vit D3 2000 units daily  8. Schizophrenia, unspecified type (HCC) Followed by psych  9. Bipolar 1 disorder (HCC) Followed by psych  10. Med review D/c Protonix, Claritin, and folic  acid as no clear indication for use  Return in about 3 months (around 12/27/2015).  Dionne Ano. Kingsley Spittle MD [facility]  09/26/2015

## 2015-09-27 ENCOUNTER — Telehealth: Payer: Self-pay

## 2015-09-27 NOTE — Telephone Encounter (Signed)
Discussed with Plonk- d/c Protonix, claritin and folic acid

## 2015-11-19 ENCOUNTER — Encounter: Payer: Self-pay | Admitting: Psychiatry

## 2015-11-19 ENCOUNTER — Ambulatory Visit (INDEPENDENT_AMBULATORY_CARE_PROVIDER_SITE_OTHER): Payer: Medicare HMO | Admitting: Psychiatry

## 2015-11-19 ENCOUNTER — Telehealth: Payer: Self-pay

## 2015-11-19 VITALS — BP 118/68 | HR 90 | Temp 97.5°F | Ht 73.0 in | Wt 187.4 lb

## 2015-11-19 DIAGNOSIS — F200 Paranoid schizophrenia: Secondary | ICD-10-CM | POA: Diagnosis not present

## 2015-11-19 MED ORDER — TRAZODONE HCL 50 MG PO TABS: 50.0000 mg | ORAL_TABLET | Freq: Every day | ORAL | Status: DC

## 2015-11-19 MED ORDER — CITALOPRAM HYDROBROMIDE 40 MG PO TABS: 40.0000 mg | ORAL_TABLET | Freq: Every day | ORAL | Status: DC

## 2015-11-19 MED ORDER — RISPERIDONE 4 MG PO TABS: 4.0000 mg | ORAL_TABLET | Freq: Every day | ORAL | Status: DC

## 2015-11-19 MED ORDER — LORAZEPAM 1 MG PO TABS: 0.5000 mg | ORAL_TABLET | Freq: Two times a day (BID) | ORAL | Status: DC

## 2015-11-19 MED ORDER — FLUPHENAZINE HCL 5 MG PO TABS: 5.0000 mg | ORAL_TABLET | Freq: Two times a day (BID) | ORAL | Status: DC

## 2015-11-19 NOTE — Telephone Encounter (Signed)
rx faxed and confirmed- lorazepam 1mg  take .5mg  by mouth two time daily #30 refill2  id # [id number] order id # [id number]

## 2015-11-19 NOTE — Progress Notes (Signed)
Psychiatric Initial Adult Assessment   Patient Identification: [name] MRN:  [id number] Date of Evaluation:  11/19/2015 Referral Source: [facility] Chief Complaint:   Chief Complaint    Establish Care; Manic Behavior     Visit Diagnosis:    ICD-9-CM ICD-10-CM   1. Schizophrenia, paranoid (HCC) 295.30 F20.0     History of Present Illness:    Patient is a [age] year old (American male with history of schizophrenia who currently lives at [facility] presented with the group home staff. He has history of schizophrenia and was following with [facility]. He has to switch his psychiatrist due to insurance purposes. Patient is currently stable on his current psycho topic medications. He was smiling and stated that he does not have anything to say. The staff reported that he is calm during most of the time and spends time watching television and responding to internal stimuli. Patient does not have any behavioral problems. During my interview patient reported that he watched TV and likes sports. He denied having any suicidal homicidal ideations or plans.  Associated Signs/Symptoms: Depression Symptoms:  anxiety, (Hypo) Manic Symptoms:  none Anxiety Symptoms:  none Psychotic Symptoms:  Delusions, Paranoia, PTSD Symptoms: Negative NA  Past Psychiatric History:   Patient has history of schizophrenia and bipolar disorder. He is currently taking Risperdal, Prolixin Celexa and lorazepam. He is compliant with his medications.  Previous Psychotropic Medications:  Not known. We'll obtain collateral information and [facility]  Substance Abuse History in the last 12 months:  Yes.    1pack / day- 10-15 years Alcohol - beer    Consequences of Substance Abuse: Negative NA  Past Medical History:  Past Medical History  Diagnosis Date  . Hypertension   . Hyperlipidemia   . Depression   . Bipolar disorder (HCC)    . Schizophrenia (HCC)    History reviewed. No pertinent past surgical history.  Family Psychiatric History:  Patient denied any history of mental illness in his family  Family History:  Family History  Problem Relation Age of Onset  . Family history unknown: Yes    Social History:   Social History   Social History  . Marital Status: Single    Spouse Name: N/A  . Number of Children: N/A  . Years of Education: N/A   Social History Main Topics  . Smoking status: Current Every Day Smoker    Types: Cigarettes  . Smokeless tobacco: Never Used  . Alcohol Use: No  . Drug Use: No  . Sexual Activity: No   Other Topics Concern  . None   Social History Narrative    Additional Social History:  Patient stated that [place] summary [place] in [place]  Allergies:  No Known Allergies  Metabolic Disorder Labs: Lab Results  Component Value Date   HGBA1C 5.8* 08/28/2015   No results found for: PROLACTIN Lab Results  Component Value Date   CHOL 124 08/28/2015   TRIG 65 08/28/2015   HDL 40 08/28/2015   CHOLHDL 3.1 08/28/2015   VLDL 20 01/04/2013   LDLCALC 71 08/28/2015   LDLCALC 37 01/04/2013     Current Medications: Current Outpatient Prescriptions  Medication Sig Dispense Refill  . amLODipine (NORVASC) 10 MG tablet Take 1 tablet (10 mg total) by mouth daily. 30 tablet 2  . Cholecalciferol (VITAMIN D) 2000 units CAPS Take 1 capsule (2,000 Units total) by mouth daily. 30 capsule 2  .  citalopram (CELEXA) 40 MG tablet Take 40 mg by mouth daily.    . fluPHENAZine (PROLIXIN) 5 MG tablet Take 1 tablet by mouth 2 (two) times daily.    Marland Kitchen lisinopril (PRINIVIL,ZESTRIL) 20 MG tablet Take 1 tablet (20 mg total) by mouth daily. 30 tablet 2  . LORazepam (ATIVAN) 1 MG tablet Take 0.5 mg by mouth 2 (two) times daily.    . risperidone (RISPERDAL) 4 MG tablet Take 4 mg by mouth daily.    . simvastatin (ZOCOR) 20 MG tablet Take 1  tablet (20 mg total) by mouth daily. 30 tablet 2  . tamsulosin (FLOMAX) 0.4 MG CAPS capsule Take 1 capsule (0.4 mg total) by mouth daily after supper. 30 capsule 2  . traZODone (DESYREL) 50 MG tablet Take 50 mg by mouth at bedtime.     No current facility-administered medications for this visit.    Neurologic: Headache: No Seizure: No Paresthesias:No  Musculoskeletal: Strength & Muscle Tone: within normal limits Gait & Station: normal Patient leans: N/A  Psychiatric Specialty Exam: ROS  Blood pressure 118/68, pulse 90, temperature 97.5 F (36.4 C), temperature source Tympanic, height  (1.854 m), weight 187 lb 6.4 oz (85.004 kg), SpO2 91 %.Body mass index is 24.73 kg/(m^2).  General Appearance: Disheveled  Eye Contact:  Poor  Speech:  Slow  Volume:  Decreased  Mood:  Depressed  Affect:  Constricted  Thought Process:  Disorganized  Orientation:  Full (Time, Place, and Person)  Thought Content:  Delusions  Suicidal Thoughts:  No  Homicidal Thoughts:  No  Memory:  Immediate;   Fair  Judgement:  Fair  Insight:  Fair  Psychomotor Activity:  Normal  Concentration:  Fair  Recall:  [employer] of Knowledge:Fair  Language: Fair  Akathisia:  No  Handed:  Right  AIMS (if indicated):    Assets:  Communication Skills Physical Health Social Support  ADL's:  Intact  Cognition: WNL  Sleep:  fair    Treatment Plan Summary: Medication management   Discussed with the staff about his medications.  I will continue him on Risperdal 4 mg by mouth daily at bedtime. Will continue on Prolixin 5 mg by mouth twice a day. He will continue on Celexa as prescribed. He will continue on lorazepam 0.5 mg by mouth twice a day Advised the staff that he needed records from [facility] to obtain his collateral information and to order his labs at his next appointment and he demonstratively understanding.  Follow-up in 2 months or earlier depending on his symptoms.   More  than 50% of the time spent in psychoeducation, counseling and coordination of care.    This note was generated in part or whole with voice recognition software. Voice regonition is usually quite accurate but there are transcription errors that can and very often do occur. I apologize for any typographical errors that were not detected and corrected.     Brandy Hale, MD 5/2/201711:14 AM

## 2015-11-20 ENCOUNTER — Other Ambulatory Visit: Payer: Self-pay | Admitting: Family Medicine

## 2015-11-20 MED ORDER — VITAMIN D 50 MCG (2000 UT) PO CAPS: 1.0000 | ORAL_CAPSULE | Freq: Every day | ORAL | Status: DC

## 2015-12-26 ENCOUNTER — Ambulatory Visit: Payer: Medicare HMO | Admitting: Family Medicine

## 2015-12-31 ENCOUNTER — Ambulatory Visit: Payer: Medicare HMO | Admitting: Family Medicine

## 2016-01-01 ENCOUNTER — Encounter: Payer: Self-pay | Admitting: Family Medicine

## 2016-01-01 ENCOUNTER — Ambulatory Visit (INDEPENDENT_AMBULATORY_CARE_PROVIDER_SITE_OTHER): Payer: Medicare HMO | Admitting: Family Medicine

## 2016-01-01 VITALS — BP 120/62 | HR 70 | Ht 73.0 in | Wt 186.0 lb

## 2016-01-01 DIAGNOSIS — Z720 Tobacco use: Secondary | ICD-10-CM

## 2016-01-01 DIAGNOSIS — F17200 Nicotine dependence, unspecified, uncomplicated: Secondary | ICD-10-CM

## 2016-01-01 DIAGNOSIS — F319 Bipolar disorder, unspecified: Secondary | ICD-10-CM

## 2016-01-01 DIAGNOSIS — E559 Vitamin D deficiency, unspecified: Secondary | ICD-10-CM | POA: Diagnosis not present

## 2016-01-01 DIAGNOSIS — N400 Enlarged prostate without lower urinary tract symptoms: Secondary | ICD-10-CM

## 2016-01-01 DIAGNOSIS — E785 Hyperlipidemia, unspecified: Secondary | ICD-10-CM | POA: Diagnosis not present

## 2016-01-01 DIAGNOSIS — R7303 Prediabetes: Secondary | ICD-10-CM | POA: Diagnosis not present

## 2016-01-01 DIAGNOSIS — Z23 Encounter for immunization: Secondary | ICD-10-CM

## 2016-01-01 DIAGNOSIS — I10 Essential (primary) hypertension: Secondary | ICD-10-CM | POA: Diagnosis not present

## 2016-01-01 NOTE — Progress Notes (Signed)
Date:  01/01/2016   Name:  [name]   DOB:  Nov 18, 1949   MRN:  [id number]  PCP:  [facility]    Chief Complaint: Hypertension and Hyperlipidemia   History of Present Illness:  This is a 65 y.o. male seen for three month f/u. Protonix, Claritin, folate stopped last visit, has been doing well per caregiver. Has some nocturia and incontinence but this is stable. Saw psych last month with no med changes made. No record of recent Tdap or Pneumovax.   Review of Systems:  Review of Systems  Constitutional: Negative for fever and fatigue.  Respiratory: Negative for cough and shortness of breath.   Cardiovascular: Negative for chest pain and leg swelling.  Endocrine: Negative for polyuria.  Genitourinary: Negative for difficulty urinating.  Neurological: Negative for syncope and light-headedness.    Patient Active Problem List   Diagnosis Date Noted  . Vitamin D deficiency 08/29/2015  . Prediabetes 08/29/2015  . Hypertension 08/28/2015  . GERD (gastroesophageal reflux disease) 08/28/2015  . Hyperlipidemia 08/28/2015  . BPH (benign prostatic hyperplasia) 08/28/2015  . Hx of sleep apnea 08/28/2015  . COPD (chronic obstructive pulmonary disease) (HCC) 08/28/2015  . Smoker 08/28/2015  . Hx of lower gastrointestinal bleeding 08/28/2015  . Hx of gastritis 08/28/2015  . Bipolar 1 disorder (HCC) 08/28/2015  . Schizophrenia (HCC) 08/28/2015    Prior to Admission medications   Medication Sig Start Date End Date Taking? Authorizing Provider  amLODipine (NORVASC) 10 MG tablet Take 1 tablet (10 mg total) by mouth daily. 08/28/15  Yes Schuyler Amor, MD  Cholecalciferol (VITAMIN D) 2000 units CAPS Take 1 capsule (2,000 Units total) by mouth daily. 11/20/15  Yes Schuyler Amor, MD  citalopram (CELEXA) 40 MG tablet Take 1 tablet (40 mg total) by mouth daily. 11/19/15  Yes Brandy Hale, MD  fluPHENAZine (PROLIXIN) 5 MG tablet Take 1 tablet (5 mg total) by mouth 2 (two) times daily.  11/19/15  Yes Brandy Hale, MD  lisinopril (PRINIVIL,ZESTRIL) 20 MG tablet Take 1 tablet (20 mg total) by mouth daily. 08/28/15  Yes Schuyler Amor, MD  LORazepam (ATIVAN) 1 MG tablet Take 0.5 tablets (0.5 mg total) by mouth 2 (two) times daily. 11/19/15  Yes Brandy Hale, MD  risperidone (RISPERDAL) 4 MG tablet Take 1 tablet (4 mg total) by mouth daily. 11/19/15  Yes Brandy Hale, MD  simvastatin (ZOCOR) 20 MG tablet Take 1 tablet (20 mg total) by mouth daily. 08/28/15  Yes Schuyler Amor, MD  tamsulosin (FLOMAX) 0.4 MG CAPS capsule Take 1 capsule (0.4 mg total) by mouth daily after supper. 08/28/15  Yes Schuyler Amor, MD  traZODone (DESYREL) 50 MG tablet Take 1 tablet (50 mg total) by mouth at bedtime. 11/19/15  Yes Brandy Hale, MD    No Known Allergies  No past surgical history on file.  Social History  Substance Use Topics  . Smoking status: Current Every Day Smoker    Types: Cigarettes  . Smokeless tobacco: Never Used  . Alcohol Use: No    Family History  Problem Relation Age of Onset  . Family history unknown: Yes    Medication list has been reviewed and updated.  Physical Examination: BP 120/62 mmHg  Pulse 70  Ht  (1.854 m)  Wt 186 lb (84.369 kg)  BMI 24.55 kg/m2  Physical Exam  Constitutional: He appears well-developed and well-nourished.  Cardiovascular: Normal rate, regular rhythm and normal heart sounds.   Pulmonary/Chest: Effort normal and breath sounds normal.  Musculoskeletal: He  exhibits no edema.  Neurological: He is alert.  Skin: Skin is warm and dry.  Psychiatric: He has a normal mood and affect. His behavior is normal.  Nursing note and vitals reviewed.   Assessment and Plan:  1. Essential hypertension Well controlled on lisinopril/amlodipine  2. BPH (benign prostatic hyperplasia) Well controlled on Flomax  3. Hyperlipidemia Well controlled on Zocor, cont given calculated 10 yr CVR 29%  4. Smoker Unlikely to quit given psych issues  5. Vitamin D  deficiency On supplement - Vitamin D (25 hydroxy)  6. Prediabetes - HgB A1c  7. Bipolar 1 disorder (HCC) Followed by [facility]  8. Need for tetanus booster - Tdap vaccine greater than or equal to 7yo IM  9. Need for pneumococcal vaccination - Pneumococcal polysaccharide vaccine 23-valent greater than or equal to 2yo subcutaneous/IM  Return in about 6 months (around 07/02/2016).  Dionne AnoWilliam M. Kingsley SpittlePlonk, Jr. MD [facility]  01/01/2016

## 2016-01-02 LAB — HEMOGLOBIN A1C
ESTIMATED AVERAGE GLUCOSE: 103 mg/dL
HEMOGLOBIN A1C: 5.2 % (ref 4.8–5.6)

## 2016-01-02 LAB — VITAMIN D 25 HYDROXY (VIT D DEFICIENCY, FRACTURES): Vit D, 25-Hydroxy: 61.7 ng/mL (ref 30.0–100.0)

## 2016-01-14 ENCOUNTER — Telehealth: Payer: Self-pay

## 2016-01-14 ENCOUNTER — Encounter: Payer: Self-pay | Admitting: Psychiatry

## 2016-01-14 ENCOUNTER — Other Ambulatory Visit: Payer: Self-pay | Admitting: Psychiatry

## 2016-01-14 ENCOUNTER — Ambulatory Visit (INDEPENDENT_AMBULATORY_CARE_PROVIDER_SITE_OTHER): Payer: Medicare HMO | Admitting: Psychiatry

## 2016-01-14 VITALS — BP 142/70 | HR 86 | Temp 98.4°F | Ht 73.0 in | Wt 188.4 lb

## 2016-01-14 DIAGNOSIS — F200 Paranoid schizophrenia: Secondary | ICD-10-CM | POA: Diagnosis not present

## 2016-01-14 MED ORDER — FLUPHENAZINE HCL 5 MG PO TABS: 5.0000 mg | ORAL_TABLET | Freq: Two times a day (BID) | ORAL | Status: DC

## 2016-01-14 MED ORDER — RISPERIDONE 3 MG PO TABS: 3.0000 mg | ORAL_TABLET | Freq: Every day | ORAL | Status: DC

## 2016-01-14 MED ORDER — LORAZEPAM 0.5 MG PO TABS: 0.5000 mg | ORAL_TABLET | Freq: Two times a day (BID) | ORAL | Status: DC

## 2016-01-14 MED ORDER — TRAZODONE HCL 50 MG PO TABS: 50.0000 mg | ORAL_TABLET | Freq: Every day | ORAL | Status: DC

## 2016-01-14 MED ORDER — CITALOPRAM HYDROBROMIDE 40 MG PO TABS: 40.0000 mg | ORAL_TABLET | Freq: Every day | ORAL | Status: DC

## 2016-01-14 NOTE — Progress Notes (Signed)
Psychiatric MD Progress Note   Patient Identification: [name] MRN:  [id number] Date of Evaluation:  01/14/2016 [facility] Chief Complaint:   Chief Complaint    Follow-up; Medication Refill     Visit Diagnosis:    ICD-9-CM ICD-10-CM   1. Schizophrenia, paranoid (HCC) 295.30 F20.0     History of Present Illness:    Patient is a 65 year old (American male with history of schizophrenia who currently lives at [facility] presented with the group home staff. He has history of schizophrenia and was following with [facility]. Patient appeared calm and alert during the interview. He was mostly quiet during the interview. Staff reported that he is currently stable and does not do much activity. He spends time watching TV. Patient did not answer many of the questions. He appeared tired during the interview. He currently denied having any suicidal ideations or plans. He was noted to be responding to internal stimuli. No new medications were adjusted. I reviewed his medication list. During my interview patient reported that he watched TV and likes sports. He denied having any suicidal homicidal ideations or plans.  Associated Signs/Symptoms: Depression Symptoms:  anxiety, (Hypo) Manic Symptoms:  none Anxiety Symptoms:  none Psychotic Symptoms:  Delusions, Paranoia, PTSD Symptoms: Negative NA  Past Psychiatric History:   Patient has history of schizophrenia and bipolar disorder. He is currently taking Risperdal, Prolixin Celexa and lorazepam. He is compliant with his medications.  Previous Psychotropic Medications:  Not known. We'll obtain collateral information and [facility]  Substance Abuse History in the last 12 months:  Yes.    1pack / day- 10-15 years Alcohol - beer    Consequences of Substance Abuse: Negative NA  Past Medical History:  Past Medical History  Diagnosis Date  .  Hypertension   . Hyperlipidemia   . Depression   . Bipolar disorder (HCC)   . Schizophrenia (HCC)    History reviewed. No pertinent past surgical history.  Family Psychiatric History:  Patient denied any history of mental illness in his family  Family History:  Family History  Problem Relation Age of Onset  . Family history unknown: Yes    Social History:   Social History   Social History  . Marital Status: Single    Spouse Name: N/A  . Number of Children: N/A  . Years of Education: N/A   Social History Main Topics  . Smoking status: Current Every Day Smoker    Types: Cigarettes  . Smokeless tobacco: Never Used  . Alcohol Use: No  . Drug Use: No  . Sexual Activity: No   Other Topics Concern  . None   Social History Narrative    Additional Social History:  Patient stated that [place] summary [place] in [place]. [place] from [place].  Allergies:   Allergies  Allergen Reactions  . Asa [Aspirin]     Hx GI bleed    Metabolic Disorder Labs: Lab Results  Component Value Date   HGBA1C 5.2 01/01/2016   No results found for: PROLACTIN Lab Results  Component Value Date   CHOL 124 08/28/2015   TRIG 65 08/28/2015   HDL 40 08/28/2015   CHOLHDL 3.1 08/28/2015   VLDL 20 01/04/2013   LDLCALC 71 08/28/2015   LDLCALC 37 01/04/2013     Current Medications: Current Outpatient Prescriptions  Medication Sig Dispense Refill  . amLODipine (NORVASC) 10 MG tablet Take 1 tablet (10 mg  total) by mouth daily. 30 tablet 2  . Cholecalciferol (VITAMIN D) 2000 units CAPS Take 1 capsule (2,000 Units total) by mouth daily. 30 capsule 5  . citalopram (CELEXA) 40 MG tablet Take 1 tablet (40 mg total) by mouth daily. 30 tablet 2  . fluPHENAZine (PROLIXIN) 5 MG tablet Take 1 tablet (5 mg total) by mouth 2 (two) times daily. 60 tablet 2  . lisinopril (PRINIVIL,ZESTRIL) 20 MG tablet Take 1 tablet (20 mg total) by mouth daily. 30 tablet 2  . LORazepam  (ATIVAN) 1 MG tablet Take 0.5 tablets (0.5 mg total) by mouth 2 (two) times daily. 60 tablet 2  . risperidone (RISPERDAL) 4 MG tablet Take 1 tablet (4 mg total) by mouth daily. 30 tablet 2  . simvastatin (ZOCOR) 20 MG tablet Take 1 tablet (20 mg total) by mouth daily. 30 tablet 2  . tamsulosin (FLOMAX) 0.4 MG CAPS capsule Take 1 capsule (0.4 mg total) by mouth daily after supper. 30 capsule 2  . traZODone (DESYREL) 50 MG tablet Take 1 tablet (50 mg total) by mouth at bedtime. 30 tablet 2   No current facility-administered medications for this visit.    Neurologic: Headache: No Seizure: No Paresthesias:No  Musculoskeletal: Strength & Muscle Tone: within normal limits Gait & Station: normal Patient leans: N/A  Psychiatric Specialty Exam: ROS   Blood pressure 142/70, pulse 86, temperature 98.4 F (36.9 C), temperature source Tympanic, height 6\' 1"  (1.854 m), weight 188 lb 6.4 oz (85.458 kg), SpO2 94 %.Body mass index is 24.86 kg/(m^2).  General Appearance: Disheveled  Eye Contact:  Poor  Speech:  Slow  Volume:  Decreased  Mood:  Depressed  Affect:  Constricted  Thought Process:  Disorganized  Orientation:  Full (Time, Place, and Person)  Thought Content:  Delusions  Suicidal Thoughts:  No  Homicidal Thoughts:  No  Memory:  Immediate;   Fair  Judgement:  Fair  Insight:  Fair  Psychomotor Activity:  Normal  Concentration:  Fair  Recall:  [employer]  Fund of Knowledge:Fair  Language: Fair  Akathisia:  No  Handed:  Right  AIMS (if indicated):    Assets:  Communication Skills Physical Health Social Support  ADL's:  Intact  Cognition: WNL  Sleep:  fair    Treatment Plan Summary: Medication management   Discussed with the staff about his medications.  I will Decrease his Risperdal 3  mg by mouth daily at bedtime. Will continue on Prolixin 5 mg by mouth twice a day. He will continue on Celexa as prescribed. He will continue on lorazepam 0.5 mg by mouth twice a day I will  order his baseline labs including prolactin level at this time.  Follow-up in 3 months or earlier depending on his symptoms.   More than 50% of the time spent in psychoeducation, counseling and coordination of care.    This note was generated in part or whole with voice recognition software. Voice regonition is usually quite accurate but there are transcription errors that can and very often do occur. I apologize for any typographical errors that were not detected and corrected.     Brandy HaleUzma Sencere Symonette, MD 6/27/201710:55 AM

## 2016-01-14 NOTE — Telephone Encounter (Signed)
faxed and confirmed rx for lorazepam (ativan) .5mg  tablet id # [id number] order # [id number]

## 2016-01-15 ENCOUNTER — Telehealth: Payer: Self-pay | Admitting: Psychiatry

## 2016-01-15 LAB — COMPREHENSIVE METABOLIC PANEL
ALT: 8 IU/L (ref 0–32)
AST: 14 IU/L (ref 0–40)
Albumin/Globulin Ratio: 1.3 (ref 1.2–2.2)
Albumin: 4 g/dL (ref 3.6–4.8)
Alkaline Phosphatase: 93 IU/L (ref 39–117)
BUN/Creatinine Ratio: 15 (ref 12–28)
BUN: 12 mg/dL (ref 8–27)
Bilirubin Total: 0.4 mg/dL (ref 0.0–1.2)
CO2: 24 mmol/L (ref 18–29)
Calcium: 9.1 mg/dL (ref 8.7–10.3)
Chloride: 103 mmol/L (ref 96–106)
Creatinine, Ser: 0.79 mg/dL (ref 0.57–1.00)
GFR calc Af Amer: 91 mL/min/{1.73_m2} (ref >59)
GFR calc non Af Amer: 79 mL/min/{1.73_m2} (ref >59)
Globulin, Total: 3.1 g/dL (ref 1.5–4.5)
Glucose: 74 mg/dL (ref 65–99)
Potassium: 4.5 mmol/L (ref 3.5–5.2)
Sodium: 142 mmol/L (ref 134–144)
Total Protein: 7.1 g/dL (ref 6.0–8.5)

## 2016-01-15 LAB — LIPID PANEL W/O CHOL/HDL RATIO
Cholesterol, Total: 108 mg/dL (ref 100–199)
HDL: 40 mg/dL (ref >39)
LDL Calculated: 61 mg/dL (ref 0–99)
Triglycerides: 36 mg/dL (ref 0–149)
VLDL Cholesterol Cal: 7 mg/dL (ref 5–40)

## 2016-01-15 LAB — CBC
Hematocrit: 43.5 % (ref 34.0–46.6)
Hemoglobin: 14.4 g/dL (ref 11.1–15.9)
MCH: 30.8 pg (ref 26.6–33.0)
MCHC: 33.1 g/dL (ref 31.5–35.7)
MCV: 93 fL (ref 79–97)
Platelets: 113 10*3/uL — ABNORMAL LOW (ref 150–379)
RBC: 4.68 x10E6/uL (ref 3.77–5.28)
RDW: 14.5 % (ref 12.3–15.4)
WBC: 6.8 10*3/uL (ref 3.4–10.8)

## 2016-01-15 LAB — TSH: TSH: 0.616 u[IU]/mL (ref 0.450–4.500)

## 2016-01-15 LAB — VITAMIN D 25 HYDROXY (VIT D DEFICIENCY, FRACTURES): Vit D, 25-Hydroxy: 53.4 ng/mL (ref 30.0–100.0)

## 2016-01-15 LAB — PROLACTIN: Prolactin: 27.8 ng/mL — ABNORMAL HIGH (ref 4.8–23.3)

## 2016-01-15 NOTE — Telephone Encounter (Signed)
Labs reviewed. Prolactin level elevated at 27.8. His Risperdal was decreased at last appointment. Will continue to taper the dose and add amantadine.

## 2016-03-12 ENCOUNTER — Other Ambulatory Visit: Payer: Self-pay | Admitting: Psychiatry

## 2016-03-16 ENCOUNTER — Other Ambulatory Visit: Payer: Self-pay | Admitting: Psychiatry

## 2016-03-17 ENCOUNTER — Other Ambulatory Visit: Payer: Self-pay | Admitting: Psychiatry

## 2016-03-17 ENCOUNTER — Telehealth: Payer: Self-pay

## 2016-03-17 NOTE — Telephone Encounter (Signed)
[facility] called needed refill on lorazepam.  pt is living in a group home and they just received info to start filling rxs for patient.  [facility] rx was sent [facility] [place].  the rx is voided they will need a new order  call  [phone].

## 2016-03-17 NOTE — Telephone Encounter (Signed)
called in rx for lorazepam with one refill. pt should have enough medication to do until his appt in 04-15-16.

## 2016-03-18 ENCOUNTER — Encounter: Payer: Self-pay | Admitting: Psychiatry

## 2016-03-18 NOTE — Telephone Encounter (Signed)
noted 

## 2016-04-15 ENCOUNTER — Ambulatory Visit (INDEPENDENT_AMBULATORY_CARE_PROVIDER_SITE_OTHER): Payer: Medicare HMO | Admitting: Psychiatry

## 2016-04-15 ENCOUNTER — Encounter: Payer: Self-pay | Admitting: Psychiatry

## 2016-04-15 VITALS — BP 114/67 | HR 86 | Temp 98.6°F | Wt 191.8 lb

## 2016-04-15 DIAGNOSIS — F200 Paranoid schizophrenia: Secondary | ICD-10-CM

## 2016-04-15 MED ORDER — FLUPHENAZINE HCL 5 MG PO TABS: 5.0000 mg | ORAL_TABLET | Freq: Two times a day (BID) | ORAL | 3 refills | Status: DC

## 2016-04-15 MED ORDER — CITALOPRAM HYDROBROMIDE 40 MG PO TABS: 40.0000 mg | ORAL_TABLET | Freq: Every day | ORAL | 5 refills | Status: DC

## 2016-04-15 MED ORDER — LORAZEPAM 0.5 MG PO TABS: 0.5000 mg | ORAL_TABLET | Freq: Two times a day (BID) | ORAL | 2 refills | Status: DC

## 2016-04-15 MED ORDER — TRAZODONE HCL 50 MG PO TABS: 50.0000 mg | ORAL_TABLET | Freq: Every day | ORAL | 5 refills | Status: DC

## 2016-04-15 MED ORDER — RISPERIDONE 3 MG PO TABS: 3.0000 mg | ORAL_TABLET | Freq: Every day | ORAL | 2 refills | Status: DC

## 2016-04-15 NOTE — Progress Notes (Signed)
Psychiatric MD Progress Note   Patient Identification: Berdine Dancerdell Briere MRN:  [id number] Date of Evaluation:  04/15/2016 Referral Source: [facility] Chief Complaint:   Chief Complaint    Follow-up; Medication Refill     Visit Diagnosis:    ICD-9-CM ICD-10-CM   1. Schizophrenia, paranoid (HCC) 295.30 F20.0     History of Present Illness:    Patient is a 65 year old  [place] male with history of schizophrenia who currently lives [facility] presented with the group home staff. He has history of schizophrenia. Patient is compliant with his medications. Group home staff reported that he does not have any acute issues at this time. Patient just stands outside his group home and watches the traffic. He does not have any history of visual hands this time. [facility]. He is currently stable on his medications. He has been compliant with the medications and the staff watches him take his medications on a regular basis.  He does not have any acute issues. He sleeps well with the help of trazodone. Patient denied having any perceptual disturbances at this time.    Associated Signs/Symptoms: Depression Symptoms:  anxiety, (Hypo) Manic Symptoms:  none Anxiety Symptoms:  none Psychotic Symptoms:  Delusions, Paranoia, PTSD Symptoms: Negative NA  Past Psychiatric History:   Patient has history of schizophrenia and bipolar disorder. He is currently taking Risperdal, Prolixin Celexa and lorazepam. He is compliant with his medications.  Previous Psychotropic Medications:  Not known. We'll obtain collateral information and his records from [facility]  Substance Abuse History in the last 12 months:  Yes.    1pack / day- 10-15 years Alcohol - beer    Consequences of Substance Abuse: Negative NA  Past Medical History:  Past Medical History:  Diagnosis Date  . Bipolar disorder (HCC)   . Depression   .  Hyperlipidemia   . Hypertension   . Schizophrenia (HCC)    History reviewed. No pertinent surgical history.  Family Psychiatric History:  Patient denied any history of mental illness in his family  Family History:  Family History  Problem Relation Age of Onset  . Family history unknown: Yes    Social History:   Social History   Social History  . Marital status: Single    Spouse name: N/A  . Number of children: N/A  . Years of education: N/A   Social History Main Topics  . Smoking status: Current Every Day Smoker    Types: Cigarettes  . Smokeless tobacco: Never Used  . Alcohol use No  . Drug use: No  . Sexual activity: No   Other Topics Concern  . None   Social History Narrative  . None    Additional Social History:  Patient stated that [place]. He is originally [place].  Allergies:   Allergies  Allergen Reactions  . Asa [Aspirin]     Hx GI bleed    Metabolic Disorder Labs: Lab Results  Component Value Date   HGBA1C 5.2 01/01/2016   Lab Results  Component Value Date   PROLACTIN 27.8 (H) 01/14/2016   Lab Results  Component Value Date   CHOL 108 01/14/2016   TRIG 36 01/14/2016   HDL 40 01/14/2016   CHOLHDL 3.1 08/28/2015   VLDL 20 01/04/2013   LDLCALC 61 01/14/2016   LDLCALC 71 08/28/2015     Current Medications: Current Outpatient Prescriptions  Medication Sig Dispense Refill  . amLODipine (NORVASC) 10 MG tablet Take 1 tablet (10 mg total) by mouth daily. 30 tablet 2  . Cholecalciferol (VITAMIN D) 2000 units CAPS Take 1 capsule (2,000 Units total) by mouth daily. 30 capsule 5  . citalopram (CELEXA) 40 MG tablet Take 1 tablet (40 mg total) by mouth daily. 30 tablet 5  . fluPHENAZine (PROLIXIN) 5 MG tablet Take 1 tablet (5 mg total) by mouth 2 (two) times daily. 60 tablet 3  . lisinopril (PRINIVIL,ZESTRIL) 20 MG tablet Take 1 tablet (20 mg total) by mouth daily. 30 tablet 2  . LORazepam  (ATIVAN) 0.5 MG tablet Take 1 tablet (0.5 mg total) by mouth 2 (two) times daily. 60 tablet 2  . risperiDONE (RISPERDAL) 3 MG tablet Take 1 tablet (3 mg total) by mouth daily. 30 tablet 2  . simvastatin (ZOCOR) 20 MG tablet Take 1 tablet (20 mg total) by mouth daily. 30 tablet 2  . tamsulosin (FLOMAX) 0.4 MG CAPS capsule Take 1 capsule (0.4 mg total) by mouth daily after supper. 30 capsule 2  . traZODone (DESYREL) 50 MG tablet Take 1 tablet (50 mg total) by mouth at bedtime. 30 tablet 5   No current facility-administered medications for this visit.     Neurologic: Headache: No Seizure: No Paresthesias:No  Musculoskeletal: Strength & Muscle Tone: within normal limits Gait & Station: normal Patient leans: N/A  Psychiatric Specialty Exam: ROS  Blood pressure 114/67, pulse 86, temperature 98.6 F (37 C), temperature source Oral, weight 191 lb 12.8 oz (87 kg).Body mass index is 25.3 kg/m.  General Appearance: Disheveled  Eye Contact:  Poor  Speech:  Slow  Volume:  Decreased  Mood:  Depressed  Affect:  Constricted  Thought Process:  Disorganized  Orientation:  Full (Time, Place, and Person)  Thought Content:  Delusions  Suicidal Thoughts:  No  Homicidal Thoughts:  No  Memory:  Immediate;   Fair  Judgement:  Fair  Insight:  Fair  Psychomotor Activity:  Normal  Concentration:  Fair  Recall:  [employer] of Knowledge:Fair  Language: Fair  Akathisia:  No  Handed:  Right  AIMS (if indicated):    Assets:  Communication Skills Physical Health Social Support  ADL's:  Intact  Cognition: WNL  Sleep:  fair    Treatment Plan Summary: Medication management   Discussed with the staff about his medications. Continue Risperdal 3  mg by mouth daily at bedtime. Will continue on Prolixin 5 mg by mouth twice a day. He will continue on Celexa as prescribed. He will continue on lorazepam 0.5 mg by mouth twice a day I will order his baseline labs including prolactin level at this  time.  Follow-up in 3 months or earlier depending on his symptoms.   More than 50% of the time spent in psychoeducation, counseling and coordination of care.    This note was generated in part or whole with voice recognition software. Voice regonition is usually quite accurate but there are transcription errors that can and very often do occur. I apologize for any typographical errors that were not detected and corrected.     Brandy Hale, MD 9/27/201711:13 AM

## 2016-06-15 ENCOUNTER — Other Ambulatory Visit: Payer: Self-pay | Admitting: Family Medicine

## 2016-06-16 ENCOUNTER — Other Ambulatory Visit: Payer: Self-pay | Admitting: Family Medicine

## 2016-07-02 ENCOUNTER — Other Ambulatory Visit: Payer: Self-pay | Admitting: Family Medicine

## 2016-07-02 ENCOUNTER — Encounter: Payer: Self-pay | Admitting: Family Medicine

## 2016-07-02 ENCOUNTER — Ambulatory Visit: Payer: Self-pay | Admitting: Family Medicine

## 2016-07-02 ENCOUNTER — Ambulatory Visit: Payer: Medicare HMO

## 2016-07-02 DIAGNOSIS — Z23 Encounter for immunization: Secondary | ICD-10-CM

## 2016-07-02 MED ORDER — SIMVASTATIN 20 MG PO TABS: 20.0000 mg | ORAL_TABLET | Freq: Every day | ORAL | 2 refills | Status: DC

## 2016-07-02 MED ORDER — TAMSULOSIN HCL 0.4 MG PO CAPS: 0.4000 mg | ORAL_CAPSULE | Freq: Every day | ORAL | 2 refills | Status: DC

## 2016-07-02 MED ORDER — AMLODIPINE BESYLATE 10 MG PO TABS: 10.0000 mg | ORAL_TABLET | Freq: Every day | ORAL | 2 refills | Status: DC

## 2016-07-02 MED ORDER — LISINOPRIL 20 MG PO TABS: 20.0000 mg | ORAL_TABLET | Freq: Every day | ORAL | 2 refills | Status: DC

## 2016-07-07 ENCOUNTER — Other Ambulatory Visit: Payer: Self-pay | Admitting: Family Medicine

## 2016-07-15 ENCOUNTER — Ambulatory Visit: Payer: Medicare HMO | Admitting: Psychiatry

## 2016-07-28 NOTE — Progress Notes (Signed)
error 

## 2016-08-04 ENCOUNTER — Ambulatory Visit (INDEPENDENT_AMBULATORY_CARE_PROVIDER_SITE_OTHER): Payer: Medicare HMO | Admitting: Family Medicine

## 2016-08-04 ENCOUNTER — Encounter: Payer: Self-pay | Admitting: Family Medicine

## 2016-08-04 VITALS — BP 124/84 | HR 74 | Resp 16 | Ht 73.0 in | Wt 196.6 lb

## 2016-08-04 DIAGNOSIS — N401 Enlarged prostate with lower urinary tract symptoms: Secondary | ICD-10-CM | POA: Diagnosis not present

## 2016-08-04 DIAGNOSIS — E559 Vitamin D deficiency, unspecified: Secondary | ICD-10-CM

## 2016-08-04 DIAGNOSIS — Z283 Underimmunization status: Secondary | ICD-10-CM

## 2016-08-04 DIAGNOSIS — I10 Essential (primary) hypertension: Secondary | ICD-10-CM | POA: Diagnosis not present

## 2016-08-04 DIAGNOSIS — F17200 Nicotine dependence, unspecified, uncomplicated: Secondary | ICD-10-CM | POA: Diagnosis not present

## 2016-08-04 DIAGNOSIS — R351 Nocturia: Secondary | ICD-10-CM | POA: Diagnosis not present

## 2016-08-04 DIAGNOSIS — Z289 Immunization not carried out for unspecified reason: Secondary | ICD-10-CM

## 2016-08-04 DIAGNOSIS — F200 Paranoid schizophrenia: Secondary | ICD-10-CM

## 2016-08-04 DIAGNOSIS — E785 Hyperlipidemia, unspecified: Secondary | ICD-10-CM | POA: Diagnosis not present

## 2016-08-04 MED ORDER — VITAMIN D3 25 MCG (1000 UT) PO CAPS: 1.0000 | ORAL_CAPSULE | Freq: Every day | ORAL | 3 refills | Status: DC

## 2016-08-04 MED ORDER — ZOSTER VACCINE LIVE 19400 UNT/0.65ML ~~LOC~~ SUSR: 0.6500 mL | Freq: Once | SUBCUTANEOUS | 0 refills | Status: AC

## 2016-08-04 NOTE — Progress Notes (Signed)
Date:  08/04/2016   Name:  [name]   DOB:  12/14/1955   MRN:  [id number]  PCP:  [facility]    Chief Complaint: Hypertension   History of Present Illness:  This is a 66 y.o. male seen in 7 month f/u. On lisin/amlo for HTN. Having nocturia x 2-3 but stable and improved from before Flomax. On Zocor for HLD, LDL 71 in Feb. Still smoking, unwilling to quit. Psych following paranoid schizophrenia and bipolar d/o, no med cahnges made last visit. Off vit D supp for unclear reasons. Hx prediabetes, last a1c 5.2% in June. No new problems per group home staff.  Review of Systems:  Review of Systems  Constitutional: Negative for chills and fever.  Respiratory: Negative for cough and shortness of breath.   Cardiovascular: Negative for chest pain and leg swelling.  Neurological: Negative for tremors and syncope.    Patient Active Problem List   Diagnosis Date Noted  . Vitamin D deficiency 08/29/2015  . Prediabetes 08/29/2015  . Hypertension 08/28/2015  . GERD (gastroesophageal reflux disease) 08/28/2015  . Hyperlipidemia 08/28/2015  . BPH (benign prostatic hyperplasia) 08/28/2015  . Hx of sleep apnea 08/28/2015  . COPD (chronic obstructive pulmonary disease) (HCC) 08/28/2015  . Smoker 08/28/2015  . Hx of lower gastrointestinal bleeding 08/28/2015  . Hx of gastritis 08/28/2015  . Bipolar 1 disorder (HCC) 08/28/2015  . Schizophrenia (HCC) 08/28/2015    Prior to Admission medications   Medication Sig Start Date End Date Taking? Authorizing Provider  amLODipine (NORVASC) 10 MG tablet TAKE 1 TABLET BY MOUTH DAILY 07/08/16  Yes Schuyler Amor, MD  citalopram (CELEXA) 40 MG tablet Take 1 tablet (40 mg total) by mouth daily. 04/15/16  Yes Brandy Hale, MD  fluPHENAZine (PROLIXIN) 5 MG tablet Take 1 tablet (5 mg total) by mouth 2 (two) times daily. 04/15/16  Yes Brandy Hale, MD  lisinopril (PRINIVIL,ZESTRIL) 20 MG tablet TAKE 1 TABLET BY MOUTH DAILY 07/08/16  Yes Schuyler Amor,  MD  LORazepam (ATIVAN) 0.5 MG tablet Take 1 tablet (0.5 mg total) by mouth 2 (two) times daily. 04/15/16  Yes Brandy Hale, MD  risperiDONE (RISPERDAL) 3 MG tablet Take 1 tablet (3 mg total) by mouth daily. 04/15/16  Yes Brandy Hale, MD  simvastatin (ZOCOR) 20 MG tablet TAKE 1 TABLET BY MOUTH DAILY 07/08/16  Yes Schuyler Amor, MD  tamsulosin (FLOMAX) 0.4 MG CAPS capsule TAKE 1 CAPSULE BY MOUTH DAILY AFTER SUPPER 07/08/16  Yes Schuyler Amor, MD  traZODone (DESYREL) 50 MG tablet Take 1 tablet (50 mg total) by mouth at bedtime. 04/15/16  Yes Brandy Hale, MD  Cholecalciferol (VITAMIN D3) 1000 units CAPS Take 1 capsule (1,000 Units total) by mouth daily. 08/04/16   Schuyler Amor, MD  Zoster Vaccine Live, PF, (ZOSTAVAX) 40981 UNT/0.65ML injection Inject 19,400 Units into the skin once. 08/04/16 08/04/16  Schuyler Amor, MD    Allergies  Allergen Reactions  . Asa [Aspirin]     Hx GI bleed    History reviewed. No pertinent surgical history.  Social History  Substance Use Topics  . Smoking status: Current Every Day Smoker    Packs/day: 1.00    Types: Cigarettes  . Smokeless tobacco: Never Used  . Alcohol use No    Family History  Problem Relation Age of Onset  . Family history unknown: Yes    Medication list has been reviewed and updated.  Physical Examination: BP 124/84   Pulse 74   Resp 16   Ht 6'  1" (1.854 m)   Wt 196 lb 9.6 oz (89.2 kg)   SpO2 98%   BMI 25.94 kg/m   Physical Exam  Constitutional: He appears well-developed and well-nourished.  Cardiovascular: Normal rate, regular rhythm and normal heart sounds.   Pulmonary/Chest: Effort normal and breath sounds normal.  Musculoskeletal: He exhibits no edema.  Neurological: He is alert.  Skin: Skin is warm and dry.  Psychiatric: He has a normal mood and affect. His behavior is normal.  Nursing note and vitals reviewed.   Assessment and Plan:  1. Essential hypertension Well controlled on lisinopril/amlodipine  2. Benign  prostatic hyperplasia with nocturia Adequate control on Flomax  3. Hyperlipidemia, unspecified hyperlipidemia type Well controlled on Zocor, CVR 30%  4. Smoker Advised cessation, unlikely given psych issues  5. Paranoid schizophrenia (HCC) Followed by psych  6. Vitamin D deficiency Restart vit D supplement  7. Delayed imms Zostavax ordered  Return in about 6 months (around 02/01/2017).  Dionne AnoWilliam M. Kingsley SpittlePlonk, Jr. MD [facility]  08/04/2016

## 2016-08-13 ENCOUNTER — Other Ambulatory Visit: Payer: Self-pay | Admitting: Psychiatry

## 2016-08-19 ENCOUNTER — Ambulatory Visit (INDEPENDENT_AMBULATORY_CARE_PROVIDER_SITE_OTHER): Payer: Medicare HMO | Admitting: Psychiatry

## 2016-08-19 ENCOUNTER — Encounter: Payer: Self-pay | Admitting: Psychiatry

## 2016-08-19 VITALS — BP 114/54 | HR 89 | Temp 98.4°F | Wt 198.6 lb

## 2016-08-19 DIAGNOSIS — F200 Paranoid schizophrenia: Secondary | ICD-10-CM | POA: Diagnosis not present

## 2016-08-19 MED ORDER — FLUPHENAZINE HCL 5 MG PO TABS: 5.0000 mg | ORAL_TABLET | Freq: Two times a day (BID) | ORAL | 6 refills | Status: DC

## 2016-08-19 MED ORDER — RISPERIDONE 3 MG PO TABS: 3.0000 mg | ORAL_TABLET | Freq: Every day | ORAL | 2 refills | Status: DC

## 2016-08-19 MED ORDER — RISPERIDONE 3 MG PO TABS: 3.0000 mg | ORAL_TABLET | Freq: Every day | ORAL | 5 refills | Status: DC

## 2016-08-19 MED ORDER — CITALOPRAM HYDROBROMIDE 40 MG PO TABS: 40.0000 mg | ORAL_TABLET | Freq: Every day | ORAL | 5 refills | Status: DC

## 2016-08-19 MED ORDER — LORAZEPAM 0.5 MG PO TABS: 0.5000 mg | ORAL_TABLET | Freq: Two times a day (BID) | ORAL | 4 refills | Status: DC

## 2016-08-19 MED ORDER — TRAZODONE HCL 50 MG PO TABS: 50.0000 mg | ORAL_TABLET | Freq: Every day | ORAL | 5 refills | Status: DC

## 2016-08-19 NOTE — Telephone Encounter (Signed)
faxed and confirmed rx for ativan.5mg  id # [id number] order # [id number]

## 2016-08-19 NOTE — Progress Notes (Signed)
Psychiatric MD Progress Note   Patient Identification: [name] MRN:  [id number] Date of Evaluation:  08/19/2016 Referral Source: [facility] Chief Complaint:   Chief Complaint    Follow-up; Medication Refill     Visit Diagnosis:    ICD-9-CM ICD-10-CM   1. Schizophrenia, paranoid (HCC) 295.30 F20.0     History of Present Illness:    Patient is a 66 year old  [place] male with history of schizophrenia who currently lives at [facility] home presented with the group home staff. He has history of schizophrenia. Patient is compliant with his medications. Group home staff reported that he does not have any acute issues at this time. Patient remains  calm and was comfortable during the interview. No acute symptoms noted at this time. He denied having any suicidal homicidal ideations or plans.      Associated Signs/Symptoms: Depression Symptoms:  anxiety, (Hypo) Manic Symptoms:  none Anxiety Symptoms:  none Psychotic Symptoms:  Delusions, Paranoia, PTSD Symptoms: Negative NA  Past Psychiatric History:   Patient has history of schizophrenia and bipolar disorder. He is currently taking Risperdal, Prolixin Celexa and lorazepam. He is compliant with his medications.  Previous Psychotropic Medications:  Not known. We'll obtain collateral information and his records from [facility]  Substance Abuse History in the last 12 months:  Yes.    1pack / day- 10-15 years Alcohol - beer    Consequences of Substance Abuse: Negative NA  Past Medical History:  Past Medical History:  Diagnosis Date  . Bipolar disorder (HCC)   . Depression   . Hyperlipidemia   . Hypertension   . Schizophrenia (HCC)    History reviewed. No pertinent surgical history.  Family Psychiatric History:  Patient denied any history of mental illness in his family  Family History:  Family History  Problem Relation Age of Onset  . Family history unknown: Yes     Social History:   Social History   Social History  . Marital status: Single    Spouse name: N/A  . Number of children: N/A  . Years of education: N/A   Social History Main Topics  . Smoking status: Current Every Day Smoker    Packs/day: 1.00    Types: Cigarettes  . Smokeless tobacco: Never Used  . Alcohol use No  . Drug use: No  . Sexual activity: No   Other Topics Concern  . None   Social History Narrative  . None    Additional Social History:  Patient stated that his summary lives in [place]. He is originally from [facility] [place] [facility] [place].  Allergies:   Allergies  Allergen Reactions  . Asa [Aspirin]     Hx GI bleed    Metabolic Disorder Labs: Lab Results  Component Value Date   HGBA1C 5.2 01/01/2016   Lab Results  Component Value Date   PROLACTIN 27.8 (H) 01/14/2016   Lab Results  Component Value Date   CHOL 108 01/14/2016   TRIG 36 01/14/2016   HDL 40 01/14/2016   CHOLHDL 3.1 08/28/2015   VLDL 20 01/04/2013   LDLCALC 61 01/14/2016   LDLCALC 71 08/28/2015     Current Medications: Current Outpatient Prescriptions  Medication Sig Dispense Refill  . amLODipine (NORVASC) 10 MG tablet TAKE 1 TABLET BY MOUTH DAILY 30 tablet 2  . Cholecalciferol (VITAMIN D3) 1000 units CAPS Take 1 capsule (1,000 Units total) by mouth daily. 90 capsule 3  . citalopram (CELEXA) 40 MG tablet Take 1 tablet (  40 mg total) by mouth daily. 30 tablet 5  . fluPHENAZine (PROLIXIN) 5 MG tablet Take 1 tablet (5 mg total) by mouth 2 (two) times daily. 60 tablet 6  . lisinopril (PRINIVIL,ZESTRIL) 20 MG tablet TAKE 1 TABLET BY MOUTH DAILY 30 tablet 2  . LORazepam (ATIVAN) 0.5 MG tablet Take 1 tablet (0.5 mg total) by mouth 2 (two) times daily. 60 tablet 4  . risperiDONE (RISPERDAL) 3 MG tablet Take 1 tablet (3 mg total) by mouth daily. 30 tablet 5  . simvastatin (ZOCOR) 20 MG tablet TAKE 1 TABLET BY MOUTH DAILY 30 tablet 2  . tamsulosin (FLOMAX) 0.4 MG CAPS capsule TAKE 1  CAPSULE BY MOUTH DAILY AFTER SUPPER 30 capsule 2  . traZODone (DESYREL) 50 MG tablet Take 1 tablet (50 mg total) by mouth at bedtime. 30 tablet 5   No current facility-administered medications for this visit.     Neurologic: Headache: No Seizure: No Paresthesias:No  Musculoskeletal: Strength & Muscle Tone: within normal limits Gait & Station: normal Patient leans: N/A  Psychiatric Specialty Exam: ROS  Blood pressure (!) 114/54, pulse 89, temperature 98.4 F (36.9 C), temperature source Oral, weight 198 lb 9.6 oz (90.1 kg).Body mass index is 26.2 kg/m.  General Appearance: Disheveled  Eye Contact:  Poor  Speech:  Slow  Volume:  Decreased  Mood:  Depressed  Affect:  Constricted  Thought Process:  Disorganized  Orientation:  Full (Time, Place, and Person)  Thought Content:  Delusions  Suicidal Thoughts:  No  Homicidal Thoughts:  No  Memory:  Immediate;   Fair  Judgement:  Fair  Insight:  Fair  Psychomotor Activity:  Normal  Concentration:  Fair  Recall:  [employer]  Fund of Knowledge:Fair  Language: Fair  Akathisia:  No  Handed:  Right  AIMS (if indicated):    Assets:  Communication Skills Physical Health Social Support  ADL's:  Intact  Cognition: WNL  Sleep:  fair    Treatment Plan Summary: Medication management   Discussed with the staff about his medications. Continue Risperdal 3  mg by mouth daily at bedtime. Will continue on Prolixin 5 mg by mouth twice a day. He will continue on Celexa as prescribed. He will continue on lorazepam 0.5 mg by mouth twice a day   Follow-up in 6 months or earlier depending on his symptoms.   More than 50% of the time spent in psychoeducation, counseling and coordination of care.    This note was generated in part or whole with voice recognition software. Voice regonition is usually quite accurate but there are transcription errors that can and very often do occur. I apologize for any typographical errors that were not  detected and corrected.     Brandy HaleUzma Lelania Bia, MD 1/31/201811:27 AM

## 2016-11-10 ENCOUNTER — Other Ambulatory Visit: Payer: Self-pay | Admitting: Family Medicine

## 2016-11-10 ENCOUNTER — Other Ambulatory Visit: Payer: Self-pay | Admitting: Psychiatry

## 2016-11-27 ENCOUNTER — Other Ambulatory Visit: Payer: Self-pay

## 2016-12-10 ENCOUNTER — Other Ambulatory Visit: Payer: Self-pay

## 2016-12-10 MED ORDER — TAMSULOSIN HCL 0.4 MG PO CAPS: ORAL_CAPSULE | ORAL | 2 refills | Status: DC

## 2016-12-11 ENCOUNTER — Other Ambulatory Visit: Payer: Self-pay | Admitting: Family Medicine

## 2016-12-11 MED ORDER — TAMSULOSIN HCL 0.4 MG PO CAPS: ORAL_CAPSULE | ORAL | 3 refills | Status: AC

## 2017-02-01 ENCOUNTER — Ambulatory Visit: Payer: Self-pay | Admitting: Family Medicine

## 2017-02-01 ENCOUNTER — Other Ambulatory Visit: Payer: Self-pay | Admitting: Psychiatry

## 2017-02-03 ENCOUNTER — Other Ambulatory Visit: Payer: Self-pay | Admitting: Psychiatry

## 2017-02-08 ENCOUNTER — Ambulatory Visit (INDEPENDENT_AMBULATORY_CARE_PROVIDER_SITE_OTHER): Payer: Medicare HMO | Admitting: Psychiatry

## 2017-02-08 ENCOUNTER — Encounter: Payer: Self-pay | Admitting: Psychiatry

## 2017-02-08 VITALS — BP 135/70 | HR 80 | Ht 71.0 in | Wt 201.0 lb

## 2017-02-08 DIAGNOSIS — F200 Paranoid schizophrenia: Secondary | ICD-10-CM | POA: Diagnosis not present

## 2017-02-08 MED ORDER — TRAZODONE HCL 50 MG PO TABS: 50.0000 mg | ORAL_TABLET | Freq: Every day | ORAL | 5 refills | Status: DC

## 2017-02-08 MED ORDER — LORAZEPAM 0.5 MG PO TABS: 0.5000 mg | ORAL_TABLET | Freq: Two times a day (BID) | ORAL | 4 refills | Status: DC

## 2017-02-08 MED ORDER — FLUPHENAZINE HCL 5 MG PO TABS: 5.0000 mg | ORAL_TABLET | Freq: Two times a day (BID) | ORAL | 6 refills | Status: DC

## 2017-02-08 MED ORDER — CITALOPRAM HYDROBROMIDE 40 MG PO TABS: 40.0000 mg | ORAL_TABLET | Freq: Every day | ORAL | 5 refills | Status: DC

## 2017-02-08 MED ORDER — RISPERIDONE 2 MG PO TABS: 2.0000 mg | ORAL_TABLET | Freq: Every day | ORAL | 5 refills | Status: DC

## 2017-02-08 NOTE — Progress Notes (Signed)
Psychiatric MD Progress Note   Patient Identification: [name] MRN:  [id number] Date of Evaluation:  02/08/2017 [facility]erral [facility] Chief Complaint:   Chief Complaint    Follow-up     Visit Diagnosis:    ICD-10-CM   1. Schizophrenia, paranoid (HCC) F20.0     History of Present Illness:    Patient is a 66 year old  African American male with history of schizophrenia who currently lives at [facility] presented with the group home staff. He has history of schizophrenia.Staff reported that he has been compliant with his medications. He has been quiet most of the time and does not have any behavior problems. He keeps to himself. He smokes 1 pack of cigarettes per day. We discussed about his medications. He has been taking Risperdal 3 mg at Prolixin 5 mg for a long time. We discussed about decreasing the dose of his medications. The staff agreed with the same. He does not have any acute issues. I will try to gradually taper him off of his medications as he has been taking the same medications for a long time. He is also going to start with a new primary care physician. He will have his labs done with them.  Aims testing done.  Patient denied having any suicidal homicidal ideations or plans. Denied having any behavior problems.  Associated Signs/Symptoms: Depression Symptoms:  anxiety, (Hypo) Manic Symptoms:  none Anxiety Symptoms:  none Psychotic Symptoms:  Delusions, Paranoia, PTSD Symptoms: Negative NA  Past Psychiatric History:   Patient has history of schizophrenia and bipolar disorder. He is currently taking Risperdal, Prolixin Celexa and lorazepam. He is compliant with his medications.  Previous Psychotropic Medications:  Not known. We'll obtain collateral information and [facility]  Substance Abuse History in the last 12 months:  Yes.    1pack / day- 10-15 years Alcohol - beer    Consequences of  Substance Abuse: Negative NA  Past Medical History:  Past Medical History:  Diagnosis Date  . Bipolar disorder (HCC)   . Depression   . Hyperlipidemia   . Hypertension   . Schizophrenia (HCC)    No past surgical history on file.  Family Psychiatric History:  Patient denied any history of mental illness in his family  Family History:  Family History  Problem Relation Age of Onset  . Family history unknown: Yes    Social History:   Social History   Social History  . Marital status: Single    Spouse name: N/A  . Number of children: N/A  . Years of education: N/A   Social History Main Topics  . Smoking status: Current Every Day Smoker    Packs/day: 1.00    Types: Cigarettes  . Smokeless tobacco: Never Used  . Alcohol use No  . Drug use: No  . Sexual activity: No   Other Topics Concern  . None   Social History Narrative  . None    Additional Social History:  Patient stated that his summary [place] in [place]. He is [place].  Allergies:   Allergies  Allergen Reactions  . Asa [Aspirin]     Hx GI bleed    Metabolic Disorder Labs: Lab Results  Component Value Date   HGBA1C 5.2 01/01/2016   Lab Results  Component Value Date   PROLACTIN 27.8 (H) 01/14/2016   Lab Results  Component Value Date   CHOL 108 01/14/2016   TRIG 36 01/14/2016  HDL 40 01/14/2016   CHOLHDL 3.1 08/28/2015   VLDL 20 01/04/2013   LDLCALC 61 01/14/2016   LDLCALC 71 08/28/2015     Current Medications: Current Outpatient Prescriptions  Medication Sig Dispense Refill  . amLODipine (NORVASC) 10 MG tablet TAKE 1 TABLET BY MOUTH DAILY 30 tablet 5  . Cholecalciferol (VITAMIN D3) 1000 units CAPS Take 1 capsule (1,000 Units total) by mouth daily. 90 capsule 3  . citalopram (CELEXA) 40 MG tablet Take 1 tablet (40 mg total) by mouth daily. 30 tablet 5  . fluPHENAZine (PROLIXIN) 5 MG tablet Take 1 tablet (5 mg total) by mouth 2 (two) times  daily. 60 tablet 6  . lisinopril (PRINIVIL,ZESTRIL) 20 MG tablet TAKE 1 TABLET BY MOUTH DAILY 30 tablet 5  . LORazepam (ATIVAN) 0.5 MG tablet Take 1 tablet (0.5 mg total) by mouth 2 (two) times daily. 60 tablet 4  . risperiDONE (RISPERDAL) 2 MG tablet Take 1 tablet (2 mg total) by mouth daily. 30 tablet 5  . simvastatin (ZOCOR) 20 MG tablet TAKE 1 TABLET BY MOUTH DAILY 30 tablet 5  . tamsulosin (FLOMAX) 0.4 MG CAPS capsule TAKE 1 CAPSULE BY MOUTH DAILY AFTER SUPPER 90 capsule 3  . traZODone (DESYREL) 50 MG tablet Take 1 tablet (50 mg total) by mouth at bedtime. 30 tablet 5   No current facility-administered medications for this visit.     Neurologic: Headache: No Seizure: No Paresthesias:No  Musculoskeletal: Strength & Muscle Tone: within normal limits Gait & Station: normal Patient leans: N/A  Psychiatric Specialty Exam: ROS  Blood pressure 135/70, pulse 80, height 5\' 11"  (1.803 m), weight 201 lb (91.2 kg).Body mass index is 28.03 kg/m.  General Appearance: Disheveled  Eye Contact:  Poor  Speech:  Slow  Volume:  Decreased  Mood:  Depressed  Affect:  Constricted  Thought Process:  Disorganized  Orientation:  Full (Time, Place, and Person)  Thought Content:  Delusions  Suicidal Thoughts:  No  Homicidal Thoughts:  No  Memory:  Immediate;   Fair  Judgement:  Fair  Insight:  Fair  Psychomotor Activity:  Normal  Concentration:  Fair  Recall:  [employer] of Knowledge:Fair  Language: Fair  Akathisia:  No  Handed:  Right  AIMS (if indicated):    Assets:  Communication Skills Physical Health Social Support  ADL's:  Intact  Cognition: WNL  Sleep:  fair    Treatment Plan Summary: Medication management   Discussed with the staff about his medications  .I will decrease the dose of Risperdal 2 mg by mouth daily at bedtime. Will continue on Prolixin 5 mg by mouth twice a day. He will continue on Celexa 40mg  as prescribed. He will continue on lorazepam 0.5 mg by mouth  twice a day   Follow-up in 6 months or earlier depending on his symptoms.   More than 50% of the time spent in psychoeducation, counseling and coordination of care.    This note was generated in part or whole with voice recognition software. Voice regonition is usually quite accurate but there are transcription errors that can and very often do occur. I apologize for any typographical errors that were not detected and corrected.     Brandy Hale, MD 7/23/201810:01 AM

## 2017-02-10 ENCOUNTER — Ambulatory Visit: Payer: Medicare HMO | Admitting: Psychiatry

## 2017-02-22 ENCOUNTER — Ambulatory Visit: Payer: Medicare HMO | Admitting: Psychiatry

## 2017-05-31 ENCOUNTER — Other Ambulatory Visit: Payer: Self-pay | Admitting: Family Medicine

## 2017-06-29 ENCOUNTER — Other Ambulatory Visit: Payer: Self-pay | Admitting: Psychiatry

## 2017-06-30 ENCOUNTER — Other Ambulatory Visit: Payer: Self-pay | Admitting: Psychiatry

## 2017-06-30 NOTE — Telephone Encounter (Signed)
LORazepam (ATIVAN) 0.5 MG tablet 60 tablet 4 02/08/2017   Sig - Route: Take 1 tablet (0.5 mg total) by mouth 2 (two) times daily. - Oral  Class: Print   Pt has appt for jan 2019

## 2017-06-30 NOTE — Telephone Encounter (Signed)
Rx was faxed and confirmed for ativan .5mg  #60 with one refill. Id # [id number] order # [id number]

## 2017-06-30 NOTE — Telephone Encounter (Signed)
Received request to refill Lorazepam, reviewed Dr.Faheem's notes as well as [place] controlled substance database. Will refill enough to last him until her can follow up with Dr.Faheem.

## 2017-07-05 ENCOUNTER — Ambulatory Visit: Payer: Medicare HMO | Admitting: Psychiatry

## 2017-07-28 ENCOUNTER — Telehealth: Payer: Self-pay | Admitting: Psychiatry

## 2017-07-29 ENCOUNTER — Other Ambulatory Visit: Payer: Self-pay | Admitting: Psychiatry

## 2017-08-04 ENCOUNTER — Other Ambulatory Visit: Payer: Self-pay | Admitting: Family Medicine

## 2017-08-04 MED ORDER — VITAMIN D3 25 MCG (1000 UT) PO CAPS: 1.0000 | ORAL_CAPSULE | Freq: Every day | ORAL | 3 refills | Status: DC

## 2017-08-04 NOTE — Telephone Encounter (Signed)
the pharmacy called states that the pt lives in a group home and will run out of medication on the 22nd that they do the bubble packs for them home and that pt will not have enough medications to due until next appt.   traZODone (DESYREL) 50 MG tablet 30 tablet 5 02/08/2017    Sig - Route: Take 1 tablet (50 mg total) by mouth at bedtime. - Oral   Sent to pharmacy as: traZODone (DESYREL) 50 MG tablet   Notes to Pharmacy: Maximum Refills Reached   E-Prescribing Status: Receipt confirmed by pharmacy (02/08/2017 9:37 AM EDT)    risperiDONE (RISPERDAL) 2 MG tablet 30 tablet 5 02/08/2017    Sig - Route: Take 1 tablet (2 mg total) by mouth daily. - Oral   Sent to pharmacy as: risperiDONE (RISPERDAL) 2 MG tablet   E-Prescribing Status: Receipt confirmed by pharmacy (02/08/2017 9:37 AM EDT)    citalopram (CELEXA) 40 MG tablet 30 tablet 5 02/08/2017    Sig - Route: Take 1 tablet (40 mg total) by mouth daily. - Oral   Sent to pharmacy as: citalopram (CELEXA) 40 MG tablet   Notes to Pharmacy: Maximum Refills Reached   E-Prescribing Status: Receipt confirmed by pharmacy (02/08/2017 9:37 AM EDT)

## 2017-08-05 ENCOUNTER — Telehealth: Payer: Self-pay | Admitting: Psychiatry

## 2017-08-05 ENCOUNTER — Other Ambulatory Visit: Payer: Self-pay | Admitting: Family Medicine

## 2017-08-05 DIAGNOSIS — F200 Paranoid schizophrenia: Secondary | ICD-10-CM

## 2017-08-05 MED ORDER — CITALOPRAM HYDROBROMIDE 40 MG PO TABS: 40.0000 mg | ORAL_TABLET | Freq: Every day | ORAL | 1 refills | Status: DC

## 2017-08-05 MED ORDER — TRAZODONE HCL 50 MG PO TABS: 50.0000 mg | ORAL_TABLET | Freq: Every day | ORAL | 1 refills | Status: DC

## 2017-08-05 MED ORDER — FLUPHENAZINE HCL 5 MG PO TABS: 5.0000 mg | ORAL_TABLET | Freq: Two times a day (BID) | ORAL | 1 refills | Status: DC

## 2017-08-05 MED ORDER — VITAMIN D3 25 MCG (1000 UT) PO CAPS: 1.0000 | ORAL_CAPSULE | Freq: Every day | ORAL | 3 refills | Status: DC

## 2017-08-05 MED ORDER — RISPERIDONE 2 MG PO TABS: 2.0000 mg | ORAL_TABLET | Freq: Every day | ORAL | 1 refills | Status: DC

## 2017-08-05 NOTE — Telephone Encounter (Signed)
Sent risperidone, prolixin, trazodone and celexa to [facility]

## 2017-08-05 NOTE — Telephone Encounter (Signed)
Sent his psychotropic medications requested to pharmacy , he will have to ask his PMD for Vitamin D renewal

## 2017-08-16 ENCOUNTER — Encounter: Payer: Self-pay | Admitting: Psychiatry

## 2017-08-16 ENCOUNTER — Other Ambulatory Visit: Payer: Self-pay

## 2017-08-16 ENCOUNTER — Ambulatory Visit (INDEPENDENT_AMBULATORY_CARE_PROVIDER_SITE_OTHER): Payer: Medicare HMO | Admitting: Psychiatry

## 2017-08-16 VITALS — BP 133/70 | HR 98 | Temp 97.8°F | Wt 200.6 lb

## 2017-08-16 DIAGNOSIS — F200 Paranoid schizophrenia: Secondary | ICD-10-CM

## 2017-08-16 MED ORDER — CITALOPRAM HYDROBROMIDE 40 MG PO TABS: 40.0000 mg | ORAL_TABLET | Freq: Every day | ORAL | 5 refills | Status: DC

## 2017-08-16 MED ORDER — TRAZODONE HCL 50 MG PO TABS: 50.0000 mg | ORAL_TABLET | Freq: Every day | ORAL | 5 refills | Status: DC

## 2017-08-16 MED ORDER — LORAZEPAM 0.5 MG PO TABS: 0.5000 mg | ORAL_TABLET | Freq: Two times a day (BID) | ORAL | 5 refills | Status: DC

## 2017-08-16 MED ORDER — RISPERIDONE 2 MG PO TABS: 2.0000 mg | ORAL_TABLET | Freq: Every day | ORAL | 5 refills | Status: DC

## 2017-08-16 MED ORDER — FLUPHENAZINE HCL 5 MG PO TABS: 5.0000 mg | ORAL_TABLET | Freq: Two times a day (BID) | ORAL | 5 refills | Status: DC

## 2017-08-16 NOTE — Progress Notes (Signed)
Psychiatric MD Progress Note   Patient Identification: [name] MRN:  [id number] Date of Evaluation:  08/16/2017 Referral Source: [facility] Chief Complaint:   Chief Complaint    Follow-up; Medication Refill     Visit Diagnosis:    ICD-10-CM   1. Schizophrenia, paranoid (HCC) F20.0     History of Present Illness:    Patient is a 67 year old  [place] male with history of schizophrenia who currently lives at [facility] group home presented with the group home staff.Staff reported that he has been compliant with his medications. He has been doing well and has been compliant with his medications and does not have any behavior problems. He has been taking Risperdal 2 mg and Prolixin as prescribed. No side effects of the medications noted. He sleeps well at night. He is getting along well with the group home staff. Patient is not having any auditory or visual hallucinations. He does not have any side effects of the medications. Discussed with the staff about his medications and he agreed with the plan. He smokes 1 pack  of cigarettes per day.   Aims testing done.  Patient denied having any suicidal homicidal ideations or plans. Denied having any behavior problems.  Associated Signs/Symptoms: Depression Symptoms:  anxiety, (Hypo) Manic Symptoms:  none Anxiety Symptoms:  none Psychotic Symptoms:  Delusions, Paranoia, PTSD Symptoms: Negative NA  Past Psychiatric History:   Patient has history of schizophrenia and bipolar disorder. He is currently taking Risperdal, Prolixin Celexa and lorazepam. He is compliant with his medications.  Previous Psychotropic Medications:  Not known. We'll obtain collateral information and his records from [facility]  Substance Abuse History in the last 12 months:  Yes.    1pack / day- 10-15 years Alcohol - beer    Consequences of Substance Abuse: Negative NA  Past Medical History:  Past Medical  History:  Diagnosis Date  . Bipolar disorder (HCC)   . Depression   . Hyperlipidemia   . Hypertension   . Schizophrenia (HCC)    History reviewed. No pertinent surgical history.  Family Psychiatric History:  Patient denied any history of mental illness in his family  Family History:  Family History  Family history unknown: Yes    Social History:   Social History   Socioeconomic History  . Marital status: Single    Spouse name: None  . Number of children: None  . Years of education: None  . Highest education level: None  Social Needs  . Financial resource strain: None  . Food insecurity - worry: None  . Food insecurity - inability: None  . Transportation needs - medical: None  . Transportation needs - non-medical: None  Occupational History  . None  Tobacco Use  . Smoking status: Current Every Day Smoker    Packs/day: 1.00    Types: Cigarettes  . Smokeless tobacco: Never Used  Substance and Sexual Activity  . Alcohol use: No    Alcohol/week: 0.0 oz  . Drug use: No  . Sexual activity: No  Other Topics Concern  . None  Social History Narrative  . None    Additional Social History:  Patient stated that his summary lives in [place]. He is originally from [place].  Allergies:   Allergies  Allergen Reactions  . Asa [Aspirin]     Hx GI bleed    Metabolic Disorder Labs: Lab Results  Component Value Date   HGBA1C 5.2 01/01/2016   Lab Results  Component Value Date   PROLACTIN 27.8 (H) 01/14/2016   Lab Results  Component Value Date   CHOL 108 01/14/2016   TRIG 36 01/14/2016   HDL 40 01/14/2016   CHOLHDL 3.1 08/28/2015   VLDL 20 01/04/2013   LDLCALC 61 01/14/2016   LDLCALC 71 08/28/2015     Current Medications: Current Outpatient Medications  Medication Sig Dispense Refill  . amLODipine (NORVASC) 10 MG tablet TAKE 1 TABLET BY MOUTH DAILY 30 tablet 5  . Cholecalciferol (VITAMIN D3) 1000 units CAPS Take 1 capsule (1,000  Units total) by mouth daily. 90 capsule 3  . citalopram (CELEXA) 40 MG tablet Take 1 tablet (40 mg total) by mouth daily. 30 tablet 1  . fluPHENAZine (PROLIXIN) 5 MG tablet Take 1 tablet (5 mg total) by mouth 2 (two) times daily. 60 tablet 1  . lisinopril (PRINIVIL,ZESTRIL) 20 MG tablet TAKE 1 TABLET BY MOUTH DAILY 30 tablet 5  . LORazepam (ATIVAN) 0.5 MG tablet Take 1 tablet (0.5 mg total) by mouth 2 (two) times daily. 60 tablet 1  . risperiDONE (RISPERDAL) 2 MG tablet Take 1 tablet (2 mg total) by mouth daily. 30 tablet 1  . simvastatin (ZOCOR) 20 MG tablet TAKE 1 TABLET BY MOUTH DAILY 30 tablet 5  . tamsulosin (FLOMAX) 0.4 MG CAPS capsule TAKE 1 CAPSULE BY MOUTH DAILY AFTER SUPPER 90 capsule 3  . traZODone (DESYREL) 50 MG tablet Take 1 tablet (50 mg total) by mouth at bedtime. 30 tablet 1   No current facility-administered medications for this visit.     Neurologic: Headache: No Seizure: No Paresthesias:No  Musculoskeletal: Strength & Muscle Tone: within normal limits Gait & Station: normal Patient leans: N/A  Psychiatric Specialty Exam: ROS  Blood pressure 133/70, pulse 98, temperature 97.8 F (36.6 C), temperature source Oral, weight 200 lb 9.6 oz (91 kg).Body mass index is 27.98 kg/m.  General Appearance: Disheveled  Eye Contact:  Poor  Speech:  Slow  Volume:  Decreased  Mood:  Depressed  Affect:  Constricted  Thought Process:  Disorganized  Orientation:  Full (Time, Place, and Person)  Thought Content:  Delusions  Suicidal Thoughts:  No  Homicidal Thoughts:  No  Memory:  Immediate;   Fair  Judgement:  Fair  Insight:  Fair  Psychomotor Activity:  Normal  Concentration:  Fair  Recall:  [employer]  Fund of Knowledge:Fair  Language: Fair  Akathisia:  No  Handed:  Right  AIMS (if indicated):    Assets:  Communication Skills Physical Health Social Support  ADL's:  Intact  Cognition: WNL  Sleep:  fair    Treatment Plan Summary: Medication management   Discussed  with the staff about his medications  Risperdal 2 mg by mouth daily at bedtime. Will continue on Prolixin 5 mg by mouth twice a day. He will continue on Celexa 40mg  as prescribed. He will continue on lorazepam 0.5 mg by mouth twice a day   Follow-up in 6 months or earlier depending on his symptoms.   More than 50% of the time spent in psychoeducation, counseling and coordination of care.    This note was generated in part or whole with voice recognition software. Voice regonition is usually quite accurate but there are transcription errors that can and very often do occur. I apologize for any typographical errors that were not detected and corrected.     Brandy HaleUzma Nasya Vincent, MD 1/28/201911:57 AM

## 2017-08-16 NOTE — Telephone Encounter (Signed)
faxed and confirmed rx for ativan .5mg  id # [id number] order # [id number] [facility]

## 2017-11-24 ENCOUNTER — Other Ambulatory Visit: Payer: Self-pay | Admitting: Family Medicine

## 2017-11-29 ENCOUNTER — Other Ambulatory Visit: Payer: Self-pay | Admitting: Family Medicine

## 2017-11-29 ENCOUNTER — Other Ambulatory Visit: Payer: Self-pay | Admitting: Psychiatry

## 2017-11-29 NOTE — Telephone Encounter (Signed)
Please refer to PCP 

## 2017-12-02 ENCOUNTER — Other Ambulatory Visit: Payer: Self-pay | Admitting: Family Medicine

## 2017-12-22 ENCOUNTER — Other Ambulatory Visit: Payer: Self-pay | Admitting: Family Medicine

## 2017-12-23 ENCOUNTER — Other Ambulatory Visit: Payer: Self-pay | Admitting: Psychiatry

## 2017-12-27 ENCOUNTER — Other Ambulatory Visit: Payer: Self-pay | Admitting: Family Medicine

## 2018-01-05 ENCOUNTER — Other Ambulatory Visit: Payer: Self-pay | Admitting: Family Medicine

## 2018-01-05 DIAGNOSIS — R0989 Other specified symptoms and signs involving the circulatory and respiratory systems: Secondary | ICD-10-CM

## 2018-02-07 ENCOUNTER — Ambulatory Visit: Payer: Medicare HMO | Admitting: Psychiatry

## 2018-02-08 ENCOUNTER — Telehealth: Payer: Self-pay

## 2018-02-08 NOTE — Telephone Encounter (Signed)
received a fax requesting a refill on lorazepam.5mg  pt was last seen on 08-16-17 next appt  03-14-18.   LORazepam (ATIVAN) 0.5 MG tablet  Medication  Date: 08/16/2017 Department: [facility] Ordering/Authorizing: Brandy HaleFaheem, Uzma, MD  Order Providers   Prescribing Provider Encounter Provider  Brandy HaleFaheem, Uzma, MD Brandy HaleFaheem, Uzma, MD  Outpatient Medication Detail    Disp Refills Start End   LORazepam (ATIVAN) 0.5 MG tablet 60 tablet 5 08/16/2017    Sig - Route: Take 1 tablet (0.5 mg total) by mouth 2 (two) times daily. - Oral   Class: Print

## 2018-02-15 NOTE — Telephone Encounter (Signed)
recevied a fax requesting a refill on lorazepam. pt was last seen on 08-16-17 next appt 03-14-18   LORazepam (ATIVAN) 0.5 MG tablet  Medication  Date: 08/16/2017 Department: [facility] Ordering/Authorizing: Brandy HaleFaheem, Uzma, MD  Order Providers   Prescribing Provider Encounter Provider  Brandy HaleFaheem, Uzma, MD Brandy HaleFaheem, Uzma, MD  Outpatient Medication Detail    Disp Refills Start End   LORazepam (ATIVAN) 0.5 MG tablet 60 tablet 5 08/16/2017    Sig - Route: Take 1 tablet (0.5 mg total) by mouth 2 (two) times daily. - Oral   Class: Print

## 2018-02-21 NOTE — Telephone Encounter (Signed)
the home health nurse stopped by office states that the patient will not have enough medication to do until his next appt on the 03-14-18 pt needs enough meds to get to appt.  please send in lorazepam , trazodone, risperidone, citalopram.

## 2018-03-14 ENCOUNTER — Encounter: Payer: Self-pay | Admitting: Psychiatry

## 2018-03-14 ENCOUNTER — Ambulatory Visit (INDEPENDENT_AMBULATORY_CARE_PROVIDER_SITE_OTHER): Payer: Medicare HMO | Admitting: Psychiatry

## 2018-03-14 ENCOUNTER — Other Ambulatory Visit: Payer: Self-pay

## 2018-03-14 VITALS — BP 102/67 | HR 78 | Temp 99.2°F | Wt 210.0 lb

## 2018-03-14 DIAGNOSIS — F200 Paranoid schizophrenia: Secondary | ICD-10-CM | POA: Diagnosis not present

## 2018-03-14 MED ORDER — LORAZEPAM 0.5 MG PO TABS: 0.5000 mg | ORAL_TABLET | Freq: Every day | ORAL | 5 refills | Status: DC

## 2018-03-14 MED ORDER — FLUPHENAZINE HCL 5 MG PO TABS: 5.0000 mg | ORAL_TABLET | Freq: Two times a day (BID) | ORAL | 5 refills | Status: DC

## 2018-03-14 MED ORDER — CITALOPRAM HYDROBROMIDE 40 MG PO TABS: 40.0000 mg | ORAL_TABLET | Freq: Every day | ORAL | 5 refills | Status: DC

## 2018-03-14 MED ORDER — TRAZODONE HCL 50 MG PO TABS: 50.0000 mg | ORAL_TABLET | Freq: Every day | ORAL | 5 refills | Status: DC

## 2018-03-14 MED ORDER — RISPERIDONE 1 MG PO TABS: 1.0000 mg | ORAL_TABLET | Freq: Every day | ORAL | 5 refills | Status: DC

## 2018-03-14 NOTE — Progress Notes (Signed)
Psychiatric MD Progress Note   Patient Identification: [name] MRN:  [id number] Date of Evaluation:  03/14/2018 Referral Source: [facility] Chief Complaint:   Chief Complaint    Follow-up; Medication Refill     Visit Diagnosis:    ICD-10-CM   1. Schizophrenia, paranoid (HCC) F20.0     History of Present Illness:    Patient is a 67 year old  [place] male with history of schizophrenia who currently lives at [facility] group home presented with the group home staff.Staff reported that he has fallen a couple of times in the past few months.  He remains calm and alert and was able to answer questions.  He denied having any perceptual disturbances.  He has been stable on the medications and does not have any acute symptoms.  Patient denied having any perceptual disturbances and denied feeling depressed hopeless helpless.  He does not have any depression anxiety or paranoia.   Discussed with the staff about decreasing the dose of his medications as he has been on high doses of psychotropic medications for a very long time.  Staff agreed with the plan.  He also follows with his outpatient primary care physician on a regular basis.  He is being monitored by the staff on a regular basis.    She was able to tell me about today's date as well as the place he lives at.  He was able to answer questions appropriately.    .  Associated Signs/Symptoms: Depression Symptoms:  anxiety, (Hypo) Manic Symptoms:  none Anxiety Symptoms:  none Psychotic Symptoms:  Delusions, Paranoia, PTSD Symptoms: Negative NA  Past Psychiatric History:   Patient has history of schizophrenia and bipolar disorder. He is currently taking Risperdal, Prolixin Celexa and lorazepam. He is compliant with his medications.  Previous Psychotropic Medications:  Not known. We'll obtain collateral information and his records from [facility]  Substance Abuse History in the last 12  months:  Yes.    1pack / day- 10-15 years Alcohol - beer    Consequences of Substance Abuse: Negative NA  Past Medical History:  Past Medical History:  Diagnosis Date  . Bipolar disorder (HCC)   . Depression   . Hyperlipidemia   . Hypertension   . Schizophrenia (HCC)    History reviewed. No pertinent surgical history.  Family Psychiatric History:  Patient denied any history of mental illness in his family  Family History:  Family History  Family history unknown: Yes    Social History:   Social History   Socioeconomic History  . Marital status: Single    Spouse name: Not on file  . Number of children: Not on file  . Years of education: Not on file  . Highest education level: Not on file  Occupational History  . Not on file  Social Needs  . Financial resource strain: Not on file  . Food insecurity:    Worry: Not on file    Inability: Not on file  . Transportation needs:    Medical: Not on file    Non-medical: Not on file  Tobacco Use  . Smoking status: Current Every Day Smoker    Packs/day: 1.00    Types: Cigarettes  . Smokeless tobacco: Never Used  Substance and Sexual Activity  . Alcohol use: No    Alcohol/week: 0.0 standard drinks  . Drug use: No  . Sexual activity: Never  Lifestyle  . Physical activity:    Days per week: Not on file  Minutes per session: Not on file  . Stress: Not on file  Relationships  . Social connections:    Talks on phone: Not on file    Gets together: Not on file    Attends religious service: Not on file    Active member of club or organization: Not on file    Attends meetings of clubs or organizations: Not on file    Relationship status: Not on file  Other Topics Concern  . Not on file  Social History Narrative  . Not on file    Additional Social History:  Patient stated that [place] summary [place]. He is [place].  Allergies:   Allergies  Allergen Reactions  .  Asa [Aspirin]     Hx GI bleed    Metabolic Disorder Labs: Lab Results  Component Value Date   HGBA1C 5.2 01/01/2016   Lab Results  Component Value Date   PROLACTIN 27.8 (H) 01/14/2016   Lab Results  Component Value Date   CHOL 108 01/14/2016   TRIG 36 01/14/2016   HDL 40 01/14/2016   CHOLHDL 3.1 08/28/2015   VLDL 20 01/04/2013   LDLCALC 61 01/14/2016   LDLCALC 71 08/28/2015     Current Medications: Current Outpatient Medications  Medication Sig Dispense Refill  . amLODipine (NORVASC) 10 MG tablet TAKE 1 TABLET BY MOUTH DAILY 30 tablet 5  . Cholecalciferol (VITAMIN D3) 1000 units CAPS Take 1 capsule (1,000 Units total) by mouth daily. 90 capsule 3  . citalopram (CELEXA) 40 MG tablet Take 1 tablet (40 mg total) by mouth daily. 30 tablet 5  . fluPHENAZine (PROLIXIN) 5 MG tablet Take 1 tablet (5 mg total) by mouth 2 (two) times daily. 60 tablet 5  . lisinopril (PRINIVIL,ZESTRIL) 20 MG tablet TAKE 1 TABLET BY MOUTH DAILY 30 tablet 5  . LORazepam (ATIVAN) 0.5 MG tablet Take 1 tablet (0.5 mg total) by mouth 2 (two) times daily. 60 tablet 5  . risperiDONE (RISPERDAL) 2 MG tablet Take 1 tablet (2 mg total) by mouth daily. 30 tablet 5  . simvastatin (ZOCOR) 20 MG tablet TAKE 1 TABLET BY MOUTH DAILY 30 tablet 5  . tamsulosin (FLOMAX) 0.4 MG CAPS capsule TAKE 1 CAPSULE BY MOUTH DAILY AFTER SUPPER 90 capsule 3  . traZODone (DESYREL) 50 MG tablet Take 1 tablet (50 mg total) by mouth at bedtime. 30 tablet 5   No current facility-administered medications for this visit.     Neurologic: Headache: No Seizure: No Paresthesias:No  Musculoskeletal: Strength & Muscle Tone: within normal limits Gait & Station: normal Patient leans: N/A  Psychiatric Specialty Exam: ROS  Blood pressure 102/67, pulse 78, temperature 99.2 F (37.3 C), temperature source Oral, weight 210 lb (95.3 kg).Body mass index is 29.29 kg/m.  General Appearance: Disheveled  Eye Contact:  Poor  Speech:  Slow   Volume:  Decreased  Mood:  Depressed  Affect:  Constricted  Thought Process:  Disorganized  Orientation:  Full (Time, Place, and Person)  Thought Content:  Delusions  Suicidal Thoughts:  No  Homicidal Thoughts:  No  Memory:  Immediate;   Fair  Judgement:  Fair  Insight:  Fair  Psychomotor Activity:  Normal  Concentration:  Fair  Recall:  FiservFair  Fund of Knowledge:Fair  Language: Fair  Akathisia:  No  Handed:  Right  AIMS (if indicated):    Assets:  Communication Skills Physical Health Social Support  ADL's:  Intact  Cognition: WNL  Sleep:  fair    Treatment Plan Summary: Medication management   We will decrease the dose of risperidone 1 mg daily. I will also decrease the dose of lorazepam 0.5 mg p.o. daily Will continue on Prolixin 5 mg by mouth twice a day. He will continue on Celexa 40mg  as prescribed.  Discussed with the staff about the same and he agreed with the plan.  Follow-up in 2 months or earlier depending on his symptoms        More than 50% of the time spent in psychoeducation, counseling and coordination of care.    This note was generated in part or whole with voice recognition software. Voice regonition is usually quite accurate but there are transcription errors that can and very often do occur. I apologize for any typographical errors that were not detected and corrected.     Brandy Hale, MD 8/26/201910:47 AM

## 2018-05-09 ENCOUNTER — Ambulatory Visit: Payer: Medicare HMO | Admitting: Psychiatry

## 2018-05-16 ENCOUNTER — Ambulatory Visit: Payer: Medicare HMO | Admitting: Psychiatry

## 2018-05-16 ENCOUNTER — Encounter: Payer: Self-pay | Admitting: Psychiatry

## 2018-05-16 ENCOUNTER — Other Ambulatory Visit: Payer: Self-pay

## 2018-05-16 VITALS — BP 102/64 | HR 96 | Temp 97.9°F | Wt 208.4 lb

## 2018-05-16 DIAGNOSIS — F17200 Nicotine dependence, unspecified, uncomplicated: Secondary | ICD-10-CM | POA: Diagnosis not present

## 2018-05-16 DIAGNOSIS — F200 Paranoid schizophrenia: Secondary | ICD-10-CM

## 2018-05-16 MED ORDER — TRAZODONE HCL 50 MG PO TABS: 50.0000 mg | ORAL_TABLET | Freq: Every day | ORAL | 5 refills | Status: DC

## 2018-05-16 MED ORDER — LORAZEPAM 0.5 MG PO TABS: 0.2500 mg | ORAL_TABLET | Freq: Every day | ORAL | 0 refills | Status: DC

## 2018-05-16 MED ORDER — CITALOPRAM HYDROBROMIDE 40 MG PO TABS: 40.0000 mg | ORAL_TABLET | Freq: Every day | ORAL | 5 refills | Status: DC

## 2018-05-16 MED ORDER — RISPERIDONE 1 MG PO TABS: 1.0000 mg | ORAL_TABLET | Freq: Every day | ORAL | 5 refills | Status: DC

## 2018-05-16 MED ORDER — FLUPHENAZINE HCL 5 MG PO TABS: 5.0000 mg | ORAL_TABLET | Freq: Every day | ORAL | 5 refills | Status: DC

## 2018-05-16 NOTE — Progress Notes (Signed)
Psychiatric MD Progress Note   Patient Identification: [name] MRN:  [id number] Date of Evaluation:  05/16/2018 Referral Source: [facility] Chief Complaint:   Chief Complaint    Follow-up; Medication Refill     Visit Diagnosis:    ICD-10-CM   1. Schizophrenia, paranoid (HCC) F20.0     History of Present Illness:    Patient is a 67 year old  [place] male with history of schizophrenia who currently lives at [facility] group home presented with the group home staff.Staff reported that he has been doing well.  He spends most of the time with the group home staff.  He does not have any acute symptoms.  He spends smoking cigarettes and talking to the staff members.  Patient appeared calm and alert.  He reported that he lives in [place] and his family lives in [place].  He reported that he has been to [place] in the past.  He appears disheveled.  He does not have any perceptual disturbances.  He does not have any suicidal homicidal ideations or plans.  Staff reported that they want to decrease the dose of lorazepam as we discussed in the past as patient does not appear to have any anxiety at this time.       .  Associated Signs/Symptoms: Depression Symptoms:  anxiety, (Hypo) Manic Symptoms:  none Anxiety Symptoms:  none Psychotic Symptoms:  Delusions, Paranoia, PTSD Symptoms: Negative NA  Past Psychiatric History:   Patient has history of schizophrenia and bipolar disorder. He is currently taking Risperdal, Prolixin Celexa and lorazepam. He is compliant with his medications.  Previous Psychotropic Medications:  Not known. We'll obtain collateral information and his records from [facility]  Substance Abuse History in the last 12 months:  Yes.    1pack / day- 10-15 years Alcohol - beer    Consequences of Substance Abuse: Negative NA  Past Medical History:  Past Medical History:  Diagnosis Date  . Bipolar disorder (HCC)    . Depression   . Hyperlipidemia   . Hypertension   . Schizophrenia (HCC)    History reviewed. No pertinent surgical history.  Family Psychiatric History:  Patient denied any history of mental illness in his family  Family History:  Family History  Family history unknown: Yes    Social History:   Social History   Socioeconomic History  . Marital status: Single    Spouse name: Not on file  . Number of children: Not on file  . Years of education: Not on file  . Highest education level: Not on file  Occupational History  . Not on file  Social Needs  . Financial resource strain: Not on file  . Food insecurity:    Worry: Not on file    Inability: Not on file  . Transportation needs:    Medical: Not on file    Non-medical: Not on file  Tobacco Use  . Smoking status: Current Every Day Smoker    Packs/day: 1.00    Types: Cigarettes  . Smokeless tobacco: Never Used  Substance and Sexual Activity  . Alcohol use: No    Alcohol/week: 0.0 standard drinks  . Drug use: No  . Sexual activity: Never  Lifestyle  . Physical activity:    Days per week: Not on file    Minutes per session: Not on file  . Stress: Not on file  Relationships  . Social connections:    Talks on phone: Not on file  Gets together: Not on file    Attends religious service: Not on file    Active member of club or organization: Not on file    Attends meetings of clubs or organizations: Not on file    Relationship status: Not on file  Other Topics Concern  . Not on file  Social History Narrative  . Not on file    Additional Social History:  Patient stated that his summary lives in [place]. He is originally from [place].  Allergies:   Allergies  Allergen Reactions  . Asa [Aspirin]     Hx GI bleed    Metabolic Disorder Labs: Lab Results  Component Value Date   HGBA1C 5.2 01/01/2016   Lab Results  Component Value Date   PROLACTIN 27.8 (H) 01/14/2016   Lab  Results  Component Value Date   CHOL 108 01/14/2016   TRIG 36 01/14/2016   HDL 40 01/14/2016   CHOLHDL 3.1 08/28/2015   VLDL 20 01/04/2013   LDLCALC 61 01/14/2016   LDLCALC 71 08/28/2015     Current Medications: Current Outpatient Medications  Medication Sig Dispense Refill  . amLODipine (NORVASC) 10 MG tablet TAKE 1 TABLET BY MOUTH DAILY 30 tablet 5  . Cholecalciferol (VITAMIN D3) 1000 units CAPS Take 1 capsule (1,000 Units total) by mouth daily. 90 capsule 3  . citalopram (CELEXA) 40 MG tablet Take 1 tablet (40 mg total) by mouth daily. 30 tablet 5  . fluPHENAZine (PROLIXIN) 5 MG tablet Take 1 tablet (5 mg total) by mouth 2 (two) times daily. 60 tablet 5  . lisinopril (PRINIVIL,ZESTRIL) 20 MG tablet TAKE 1 TABLET BY MOUTH DAILY 30 tablet 5  . LORazepam (ATIVAN) 0.5 MG tablet Take 1 tablet (0.5 mg total) by mouth at bedtime. 30 tablet 5  . risperiDONE (RISPERDAL) 1 MG tablet Take 1 tablet (1 mg total) by mouth daily. 30 tablet 5  . simvastatin (ZOCOR) 20 MG tablet TAKE 1 TABLET BY MOUTH DAILY 30 tablet 5  . tamsulosin (FLOMAX) 0.4 MG CAPS capsule TAKE 1 CAPSULE BY MOUTH DAILY AFTER SUPPER 90 capsule 3  . traZODone (DESYREL) 50 MG tablet Take 1 tablet (50 mg total) by mouth at bedtime. 30 tablet 5   No current facility-administered medications for this visit.     Neurologic: Headache: No Seizure: No Paresthesias:No  Musculoskeletal: Strength & Muscle Tone: within normal limits Gait & Station: normal Patient leans: N/A  Psychiatric Specialty Exam: ROS  Blood pressure 102/64, pulse 96, temperature 97.9 F (36.6 C), temperature source Oral, weight 208 lb 6.4 oz (94.5 kg).Body mass index is 29.07 kg/m.  General Appearance: Disheveled  Eye Contact:  Poor  Speech:  Slow  Volume:  Decreased  Mood:  Depressed  Affect:  Constricted  Thought Process:  Disorganized  Orientation:  Full (Time, Place, and Person)  Thought Content:  Delusions  Suicidal Thoughts:  No  Homicidal  Thoughts:  No  Memory:  Immediate;   Fair  Judgement:  Fair  Insight:  Fair  Psychomotor Activity:  Normal  Concentration:  Fair  Recall:  [employer] of Knowledge:Fair  Language: Fair  Akathisia:  No  Handed:  Right  AIMS (if indicated):    Assets:  Communication Skills Physical Health Social Support  ADL's:  Intact  Cognition: WNL  Sleep:  fair    Treatment Plan Summary: Medication management   Continue risperidone 1 mg daily. I will also decrease the dose of lorazepam 0.25 mg p.o. daily 1  month and then stop.   Will change Prolixin 5 mg p.o. Daily. he will continue on Celexa 40mg  as prescribed.  Discussed with the staff about the same and he agreed with the plan.    Follow-up in 4  months or earlier depending on his symptoms        More than 50% of the time spent in psychoeducation, counseling and coordination of care.    This note was generated in part or whole with voice recognition software. Voice regonition is usually quite accurate but there are transcription errors that can and very often do occur. I apologize for any typographical errors that were not detected and corrected.     Brandy Hale, MD 10/28/201910:14 AM

## 2018-09-12 ENCOUNTER — Ambulatory Visit: Payer: Medicare HMO | Admitting: Psychiatry

## 2018-09-26 ENCOUNTER — Ambulatory Visit: Payer: Medicare HMO | Admitting: Psychiatry

## 2018-10-17 ENCOUNTER — Encounter: Payer: Self-pay | Admitting: Psychiatry

## 2018-10-17 ENCOUNTER — Other Ambulatory Visit: Payer: Self-pay

## 2018-10-17 ENCOUNTER — Ambulatory Visit (INDEPENDENT_AMBULATORY_CARE_PROVIDER_SITE_OTHER): Payer: Medicare HMO | Admitting: Psychiatry

## 2018-10-17 DIAGNOSIS — F200 Paranoid schizophrenia: Secondary | ICD-10-CM | POA: Diagnosis not present

## 2018-10-17 MED ORDER — RISPERIDONE 1 MG PO TABS: 1.0000 mg | ORAL_TABLET | Freq: Every day | ORAL | 5 refills | Status: DC

## 2018-10-17 MED ORDER — TRAZODONE HCL 50 MG PO TABS: 50.0000 mg | ORAL_TABLET | Freq: Every day | ORAL | 5 refills | Status: DC

## 2018-10-17 MED ORDER — FLUPHENAZINE HCL 5 MG PO TABS: 5.0000 mg | ORAL_TABLET | Freq: Every day | ORAL | 5 refills | Status: DC

## 2018-10-17 MED ORDER — CITALOPRAM HYDROBROMIDE 40 MG PO TABS: 40.0000 mg | ORAL_TABLET | Freq: Every day | ORAL | 5 refills | Status: DC

## 2018-10-17 NOTE — Progress Notes (Signed)
Patient is a 68 year old malewith history of schizophrenia who currently lives at the group home. Contacted the staff about his medication refills. He is doing well at the group home and no acute symptoms noted at this time.  Patient was not available to communicate at this time.  Plan. Medication refilled.  Advised staff to callfor any acute symptoms or changes in his status and they agreed to the plan.    The staff was advised to call back or seek an in-person evaluation if the symptoms worsen or if the condition fails to improve as anticipated.   I provided 5 minutes of non-face-to-face time during this encounter.

## 2019-01-16 ENCOUNTER — Other Ambulatory Visit: Payer: Self-pay

## 2019-01-16 ENCOUNTER — Ambulatory Visit (INDEPENDENT_AMBULATORY_CARE_PROVIDER_SITE_OTHER): Payer: Medicare HMO | Admitting: Psychiatry

## 2019-01-16 ENCOUNTER — Encounter: Payer: Self-pay | Admitting: Psychiatry

## 2019-01-16 DIAGNOSIS — F200 Paranoid schizophrenia: Secondary | ICD-10-CM

## 2019-01-16 MED ORDER — FLUPHENAZINE HCL 5 MG PO TABS: 5.0000 mg | ORAL_TABLET | Freq: Every day | ORAL | 5 refills | Status: DC

## 2019-01-16 MED ORDER — TRAZODONE HCL 50 MG PO TABS: 50.0000 mg | ORAL_TABLET | Freq: Every day | ORAL | 5 refills | Status: DC

## 2019-01-16 MED ORDER — CITALOPRAM HYDROBROMIDE 40 MG PO TABS: 40.0000 mg | ORAL_TABLET | Freq: Every day | ORAL | 5 refills | Status: DC

## 2019-01-16 MED ORDER — RISPERIDONE 1 MG PO TABS: 1.0000 mg | ORAL_TABLET | Freq: Every day | ORAL | 5 refills | Status: DC

## 2019-01-16 NOTE — Progress Notes (Signed)
Patient ID: [name], male   DOB: 1949/11/09, 68 y.o.   MRN: [id number]  Patient is a 68 year old male with history of schizophrenia who currently lives in the group home. He was followed up for medication management. Group home staff reported that he is doing well and compliant with her medications. No acute issues noted. He sleeps well and has been following the group rules.  Plan I will refill his medications.  He will follow up in four month earlier depending on his symptoms.  I discussed the assessment and treatment plan with the patient. The patient was provided an opportunity to ask questions and all were answered. The patient agreed with the plan and demonstrated an understanding of the instructions.   The patient was advised to call back or seek an in-person evaluation if the symptoms worsen or if the condition fails to improve as anticipated.   I provided 15 minutes of non-face-to-face time during this encounter.

## 2019-05-15 ENCOUNTER — Ambulatory Visit: Payer: Medicare HMO | Admitting: Psychiatry

## 2019-05-29 ENCOUNTER — Ambulatory Visit (INDEPENDENT_AMBULATORY_CARE_PROVIDER_SITE_OTHER): Payer: Medicare HMO | Admitting: Psychiatry

## 2019-05-29 ENCOUNTER — Other Ambulatory Visit: Payer: Self-pay

## 2019-05-29 ENCOUNTER — Encounter: Payer: Self-pay | Admitting: Psychiatry

## 2019-05-29 DIAGNOSIS — F200 Paranoid schizophrenia: Secondary | ICD-10-CM

## 2019-05-29 MED ORDER — RISPERIDONE 1 MG PO TABS: 1.0000 mg | ORAL_TABLET | Freq: Every day | ORAL | 2 refills | Status: DC

## 2019-05-29 MED ORDER — FLUPHENAZINE HCL 5 MG PO TABS: 5.0000 mg | ORAL_TABLET | Freq: Every day | ORAL | 2 refills | Status: DC

## 2019-05-29 MED ORDER — CITALOPRAM HYDROBROMIDE 40 MG PO TABS: 40.0000 mg | ORAL_TABLET | Freq: Every day | ORAL | 2 refills | Status: DC

## 2019-05-29 MED ORDER — TRAZODONE HCL 50 MG PO TABS: 50.0000 mg | ORAL_TABLET | Freq: Every day | ORAL | 2 refills | Status: DC

## 2019-05-29 NOTE — Progress Notes (Signed)
BH MD OP Progress Note  I connected with  [name] on 05/29/19 by phone and verified that I am speaking with the correct person using two identifiers.   I discussed the limitations of evaluation and management by phone. The patient expressed understanding and agreed to proceed.   05/29/2019 10:08 AM [name]  MRN:  [id number]  Chief Complaint:  " I am doing well."  HPI: Pt resides in group home [facility] but is currently visiting his family home for a few days. Pt stated that he is doing well. He denied any concerns or issues. He denied any side effects to his medications. He sleeps well at night. He denied any delusions or hallucinations.  His group home staff member Mr. Ladene ArtistDerrick denied any concerns regarding the pt.  Visit Diagnosis:    ICD-10-CM   1. Schizophrenia, paranoid (HCC)  F20.0     Past Psychiatric History: Schizophrenia  Past Medical History:  Past Medical History:  Diagnosis Date  . Bipolar disorder (HCC)   . Depression   . Hyperlipidemia   . Hypertension   . Schizophrenia (HCC)    History reviewed. No pertinent surgical history.  Family Psychiatric History: unknown  Family History:  Family History  Family history unknown: Yes    Social History:  Social History   Socioeconomic History  . Marital status: Single    Spouse name: Not on file  . Number of children: Not on file  . Years of education: Not on file  . Highest education level: Not on file  Occupational History  . Not on file  Social Needs  . Financial resource strain: Not on file  . Food insecurity    Worry: Not on file    Inability: Not on file  . Transportation needs    Medical: Not on file    Non-medical: Not on file  Tobacco Use  . Smoking status: Current Every Day Smoker    Packs/day: 1.00    Types: Cigarettes  . Smokeless tobacco: Never Used  Substance and Sexual Activity  . Alcohol use: No    Alcohol/week: 0.0 standard drinks  . Drug use: No  . Sexual  activity: Never  Lifestyle  . Physical activity    Days per week: Not on file    Minutes per session: Not on file  . Stress: Not on file  Relationships  . Social Musicianconnections    Talks on phone: Not on file    Gets together: Not on file    Attends religious service: Not on file    Active member of club or organization: Not on file    Attends meetings of clubs or organizations: Not on file    Relationship status: Not on file  Other Topics Concern  . Not on file  Social History Narrative  . Not on file    Allergies:  Allergies  Allergen Reactions  . Asa [Aspirin]     Hx GI bleed    Metabolic Disorder Labs: Lab Results  Component Value Date   HGBA1C 5.2 01/01/2016   Lab Results  Component Value Date   PROLACTIN 27.8 (H) 01/14/2016   Lab Results  Component Value Date   CHOL 108 01/14/2016   TRIG 36 01/14/2016   HDL 40 01/14/2016   CHOLHDL 3.1 08/28/2015   VLDL 20 01/04/2013   LDLCALC 61 01/14/2016   LDLCALC 71 08/28/2015   Lab Results  Component Value Date   TSH 0.616 01/14/2016   TSH 0.593 08/28/2015  Therapeutic Level Labs: No results found for: LITHIUM No results found for: VALPROATE No components found for:  CBMZ  Current Medications: Current Outpatient Medications  Medication Sig Dispense Refill  . amLODipine (NORVASC) 10 MG tablet TAKE 1 TABLET BY MOUTH DAILY 30 tablet 5  . Cholecalciferol (VITAMIN D3) 1000 units CAPS Take 1 capsule (1,000 Units total) by mouth daily. 90 capsule 3  . citalopram (CELEXA) 40 MG tablet Take 1 tablet (40 mg total) by mouth daily. 30 tablet 5  . fluPHENAZine (PROLIXIN) 5 MG tablet Take 1 tablet (5 mg total) by mouth daily. 60 tablet 5  . lisinopril (PRINIVIL,ZESTRIL) 20 MG tablet TAKE 1 TABLET BY MOUTH DAILY 30 tablet 5  . risperiDONE (RISPERDAL) 1 MG tablet Take 1 tablet (1 mg total) by mouth daily. 30 tablet 5  . simvastatin (ZOCOR) 20 MG tablet TAKE 1 TABLET BY MOUTH DAILY 30 tablet 5  . tamsulosin (FLOMAX) 0.4 MG  CAPS capsule TAKE 1 CAPSULE BY MOUTH DAILY AFTER SUPPER 90 capsule 3  . traZODone (DESYREL) 50 MG tablet Take 1 tablet (50 mg total) by mouth at bedtime. 30 tablet 5   No current facility-administered medications for this visit.      Musculoskeletal: Strength & Muscle Tone: unable to assess due to phone visit  Gait & Station: unable to assess due to phone visit Patient leans: unable to assess due to phone visit  Psychiatric Specialty Exam: ROS  There were no vitals taken for this visit.There is no height or weight on file to calculate BMI.  General Appearance: unable to assess due to phone visit  Eye Contact:  unable to assess due to phone visit  Speech:  Clear and Coherent and Normal Rate  Volume:  Normal  Mood:  Euthymic  Affect:  unable to assess due to phone visit  Thought Process:  Goal Directed, Linear and Descriptions of Associations: Intact  Orientation:  Full (Time, Place, and Person)  Thought Content: Logical   Suicidal Thoughts:  No  Homicidal Thoughts:  No  Memory:  Recent;   Good Remote;   Good  Judgement:  Fair  Insight:  Fair  Psychomotor Activity:  Normal  Concentration:  Concentration: Good and Attention Span: Fair  Recall:  Good  Fund of Knowledge: Good  Language: Good  Akathisia:  No  Handed:  Right  AIMS (if indicated): unable to assess due to phone visit  Assets:  Communication Skills Desire for Improvement Financial Resources/Insurance Housing Social Support  ADL's:  Intact  Cognition: WNL  Sleep:  Good   Screenings: AIMS     Office Visit from 08/19/2016 in [place] Office Visit from 11/19/2015 in [place] Total Score  0  0    PHQ2-9     Office Visit from 08/28/2015 in [facility]  PHQ-2 Total Score  0       Assessment and Plan: 68 y/o male with hx of Schizophrenia residing in a group home doing well on his current medication regimen.  1. Schizophrenia,  paranoid ([place]) - citalopram (CELEXA) 40 MG tablet; Take 1 tablet (40 mg total) by mouth daily.  Dispense: 30 tablet; Refill: 2 - risperiDONE (RISPERDAL) 1 MG tablet; Take 1 tablet (1 mg total) by mouth daily.  Dispense: 30 tablet; Refill: 2 - fluPHENAZine (PROLIXIN) 5 MG tablet; Take 1 tablet (5 mg total) by mouth daily.  Dispense: 60 tablet; Refill: 2 - traZODone (DESYREL) 50 MG tablet; Take 1 tablet (50 mg  total) by mouth at bedtime.  Dispense: 30 tablet; Refill: 2  Continue current medications. F/up in 3 months.    Zena Amos, MD 05/29/2019, 10:08 AM

## 2019-06-01 ENCOUNTER — Ambulatory Visit: Payer: Self-pay | Admitting: Cardiology

## 2019-06-19 ENCOUNTER — Ambulatory Visit (INDEPENDENT_AMBULATORY_CARE_PROVIDER_SITE_OTHER): Payer: Medicare HMO | Admitting: Cardiology

## 2019-06-19 ENCOUNTER — Other Ambulatory Visit: Payer: Self-pay

## 2019-06-19 ENCOUNTER — Encounter: Payer: Self-pay | Admitting: Cardiology

## 2019-06-19 VITALS — BP 90/60 | HR 103 | Ht 69.0 in | Wt 207.2 lb

## 2019-06-19 DIAGNOSIS — F17200 Nicotine dependence, unspecified, uncomplicated: Secondary | ICD-10-CM | POA: Diagnosis not present

## 2019-06-19 DIAGNOSIS — I10 Essential (primary) hypertension: Secondary | ICD-10-CM

## 2019-06-19 DIAGNOSIS — R0989 Other specified symptoms and signs involving the circulatory and respiratory systems: Secondary | ICD-10-CM

## 2019-06-19 DIAGNOSIS — R011 Cardiac murmur, unspecified: Secondary | ICD-10-CM | POA: Diagnosis not present

## 2019-06-19 NOTE — Patient Instructions (Signed)
Medication Instructions:  Your physician has recommended you make the following change in your medication:   STOP Lisinopril  *If you need a refill on your cardiac medications before your next appointment, please call your pharmacy*  Lab Work: None ordered If you have labs (blood work) drawn today and your tests are completely normal, you will receive your results only by: Marland Kitchen MyChart Message (if you have MyChart) OR . A paper copy in the mail If you have any lab test that is abnormal or we need to change your treatment, we will call you to review the results.  Testing/Procedures: Your physician has requested that you have an echocardiogram. Echocardiography is a painless test that uses sound waves to create images of your heart. It provides your doctor with information about the size and shape of your heart and how well your heart's chambers and valves are working. This procedure takes approximately one hour. There are no restrictions for this procedure.  Your physician has requested that you have a carotid duplex. This test is an ultrasound of the carotid arteries in your neck. It looks at blood flow through these arteries that supply the brain with blood. Allow one hour for this exam. There are no restrictions or special instructions.    Follow-Up: At [facility], you and your health needs are our priority.  As part of our continuing mission to provide you with exceptional heart care, we have created designated Provider Care Teams.  These Care Teams include your primary Cardiologist (physician) and Advanced Practice Providers (APPs -  Physician Assistants and Nurse Practitioners) who all work together to provide you with the care you need, when you need it.  Your next appointment:   2 month(s)  The format for your next appointment:   In Person  Provider:    You may see Kate Sable, MD or one of the following Advanced Practice Providers on your designated Care Team:     Murray Hodgkins, NP  Christell Faith, PA-C  Marrianne Mood, PA-C   Other Instructions N/A

## 2019-06-19 NOTE — Progress Notes (Signed)
Cardiology Office Note:    Date:  06/19/2019   ID:  [name], DOB 12-07-1949, MRN [id number]  PCP:  [facility] [place]  Cardiologist:  Kate Sable, MD  Electrophysiologist:  None   Referring MD: Ranae Plumber, [place]   Chief Complaint  Patient presents with  . New Patient (Initial Visit)    Heart murmur and carotid bruits; Meds verbally reviewed with patient.    History of Present Illness:   History is difficult to ascertain due to condition of patient.  [name] is a 68 y.o. male with a hx of hypertension, hyperlipidemia, schizophrenia, bipolar disorder, current smoker who presents due to cardiac murmur and carotid bruits.  Patient lives in a group home, presents today with one of the group home staff.  Primary care physician noticed he has a cardiac murmur and also carotid bruit on recent exam.  Patient denies any history of stroke, denies any history of heart disease.  He had a carotid ultrasound performed about 6 years ago which was normal.  He denies any symptoms of chest pain, shortness of breath, edema, stroke, dizziness.  He otherwise states doing okay.  His chart lists aspirin allergy, GI bleeding history but patient cannot confirm due to condition.  Past Medical History:  Diagnosis Date  . Bipolar disorder ([place])   . Depression   . Hyperlipidemia   . Hypertension   . Schizophrenia ([place])     History reviewed. No pertinent surgical history.  Current Medications: Current Meds  Medication Sig  . amLODipine (NORVASC) 10 MG tablet TAKE 1 TABLET BY MOUTH DAILY  . benztropine (COGENTIN) 1 MG tablet Take 1 mg by mouth daily.  . Cholecalciferol (VITAMIN D3) 1000 units CAPS Take 1 capsule (1,000 Units total) by mouth daily.  . citalopram (CELEXA) 40 MG tablet Take 1 tablet (40 mg total) by mouth daily.  . clonazePAM (KLONOPIN) 1 MG tablet Take 1 mg by mouth 2 (two) times daily.  . fluPHENAZine (PROLIXIN) 5 MG tablet Take 1 tablet (5 mg total) by mouth  daily.  Marland Kitchen lithium carbonate 300 MG capsule Take 1,200 mg by mouth at bedtime.  Marland Kitchen loratadine (CLARITIN) 10 MG tablet Take 10 mg by mouth daily.  . paliperidone (INVEGA) 9 MG 24 hr tablet Take 9 mg by mouth at bedtime.  . risperiDONE (RISPERDAL) 1 MG tablet Take 1 tablet (1 mg total) by mouth daily.  . simvastatin (ZOCOR) 20 MG tablet TAKE 1 TABLET BY MOUTH DAILY  . tamsulosin (FLOMAX) 0.4 MG CAPS capsule TAKE 1 CAPSULE BY MOUTH DAILY AFTER SUPPER  . traZODone (DESYREL) 50 MG tablet Take 1 tablet (50 mg total) by mouth at bedtime.  . [DISCONTINUED] lisinopril (PRINIVIL,ZESTRIL) 20 MG tablet TAKE 1 TABLET BY MOUTH DAILY     Allergies:   Asa [aspirin]   Social History   Socioeconomic History  . Marital status: Single    Spouse name: Not on file  . Number of children: Not on file  . Years of education: Not on file  . Highest education level: Not on file  Occupational History  . Not on file  Social Needs  . Financial resource strain: Not on file  . Food insecurity    Worry: Not on file    Inability: Not on file  . Transportation needs    Medical: Not on file    Non-medical: Not on file  Tobacco Use  . Smoking status: Current Every Day Smoker    Packs/day: 1.00    Types:  Cigarettes  . Smokeless tobacco: Never Used  Substance and Sexual Activity  . Alcohol use: No    Alcohol/week: 0.0 standard drinks  . Drug use: No  . Sexual activity: Never  Lifestyle  . Physical activity    Days per week: Not on file    Minutes per session: Not on file  . Stress: Not on file  Relationships  . Social Musician on phone: Not on file    Gets together: Not on file    Attends religious service: Not on file    Active member of club or organization: Not on file    Attends meetings of clubs or organizations: Not on file    Relationship status: Not on file  Other Topics Concern  . Not on file  Social History Narrative  . Not on file     Family History: The patient's Family  history is unknown by patient.  ROS:   Please see the history of present illness.     All other systems reviewed and are negative.  EKGs/Labs/Other Studies Reviewed:    The following studies were reviewed today:  Ultrasound carotid bilateral date 01/03/2013 Findings:  No significant atherosclerotic plaques identified in the extracranial  carotid arteries.  The peak systolic velocities are not elevated.  The vertebral arteries are patent bilaterally.  IMPRESSION:  No evidence of hemodynamically significant stenosis in the extracranial  carotid arteries.   EKG:  EKG is  ordered today.  The ekg ordered today demonstrates sinus tachycardia, heart rate 103, old anteroseptal infarct.  Recent Labs: No results found for requested labs within last 8760 hours.  Recent Lipid Panel    Component Value Date/Time   CHOL 108 01/14/2016 1155   CHOL 78 01/04/2013 0434   TRIG 36 01/14/2016 1155   TRIG 100 01/04/2013 0434   HDL 40 01/14/2016 1155   HDL 21 (L) 01/04/2013 0434   CHOLHDL 3.1 08/28/2015 1132   VLDL 20 01/04/2013 0434   LDLCALC 61 01/14/2016 1155   LDLCALC 37 01/04/2013 0434    Physical Exam:    VS:  BP 90/60 (BP Location: Right Arm, Patient Position: Sitting, Cuff Size: Normal)   Pulse (!) 103   Ht 5\' 9"  (1.753 m)   Wt 207 lb 4 oz (94 kg)   SpO2 96%   BMI 30.61 kg/m     Wt Readings from Last 3 Encounters:  06/19/19 207 lb 4 oz (94 kg)  05/16/18 208 lb 6.4 oz (94.5 kg)  03/14/18 210 lb (95.3 kg)     GEN:  Well nourished, well developed in no acute distress HEENT: Normal NECK: No JVD; right carotid bruit noted LYMPHATICS: No lymphadenopathy CARDIAC: RRR, 1-2/6 systolic murmur noted right upper sternal border, rubs, gallops RESPIRATORY:  Clear to auscultation without rales, wheezing or rhonchi  ABDOMEN: Soft, non-tender, non-distended MUSCULOSKELETAL:  No edema; No deformity  SKIN: Warm and dry NEUROLOGIC:  Alert  PSYCHIATRIC: Answers okay, yes and no only   ASSESSMENT:   Systolic murmur noted on cardiac exam.  Right carotid bruit also noted.  His blood pressure is on the low side with systolic of 90/60, heart rate 03/16/18. 1. Cardiac murmur   2. Carotid bruit, unspecified laterality   3. Essential hypertension   4. Smoking    PLAN:    In order of problems listed above:  1. Get echocardiogram 2. Get carotid ultrasound 3. Blood pressure on the low side.  Stop lisinopril.  Continue amlodipine 10 mg daily.  4. Advised to quit but unlikely due to psych issues.  Follow-up in 2 months.   Medication Adjustments/Labs and Tests Ordered: Current medicines are reviewed at length with the patient today.  Concerns regarding medicines are outlined above.  Orders Placed This Encounter  Procedures  . EKG 12-Lead  . ECHOCARDIOGRAM COMPLETE  . VAS US CAROTID   No orders of the defined types were placed in this encounter.   Patient Instructions  Medication Instructions:  Your physician has recommended you make the following change in your medication:   STOP Lisinopril  *If you need a refill on your cardiac medications before your next appointment, please call your pharmacy*  Lab Work: None ordered If you have labs (blood work) drawn today and your tests are completely normal, you will receive your results only by: Marland Kitchen. MyChart Message (if you have MyChart) OR . A paper copy in the mail If you have any lab test that is abnormal or we need to change your treatment, we will call you to review the results.  Testing/Procedures: Your physician has requested that you have an echocardiogram. Echocardiography is a painless test that uses sound waves to create images of your heart. It provides your doctor with information about the size and shape of your heart and how well your heart's chambers and valves are working. This procedure takes approximately one hour. There are no restrictions for this procedure.  Your physician has requested that you have a  carotid duplex. This test is an ultrasound of the carotid arteries in your neck. It looks at blood flow through these arteries that supply the brain with blood. Allow one hour for this exam. There are no restrictions or special instructions.    Follow-Up: At [facility], you and your health needs are our priority.  As part of our continuing mission to provide you with exceptional heart care, we have created designated Provider Care Teams.  These Care Teams include your primary Cardiologist (physician) and Advanced Practice Providers (APPs -  Physician Assistants and Nurse Practitioners) who all work together to provide you with the care you need, when you need it.  Your next appointment:   2 month(s)  The format for your next appointment:   In Person  Provider:    You may see Debbe OdeaBrian Agbor-Etang, MD or one of the following Advanced Practice Providers on your designated Care Team:    Nicolasa Duckinghristopher Berge, NP  Eula Listenyan Dunn, PA-C  Marisue IvanJacquelyn Visser, PA-C   Other Instructions N/A     Signed, Debbe OdeaBrian Agbor-Etang, MD  06/19/2019 2:52 PM    [facility]

## 2019-07-19 ENCOUNTER — Ambulatory Visit (INDEPENDENT_AMBULATORY_CARE_PROVIDER_SITE_OTHER): Payer: Medicare HMO

## 2019-07-19 ENCOUNTER — Other Ambulatory Visit: Payer: Self-pay

## 2019-07-19 DIAGNOSIS — R011 Cardiac murmur, unspecified: Secondary | ICD-10-CM | POA: Diagnosis not present

## 2019-07-19 DIAGNOSIS — R0989 Other specified symptoms and signs involving the circulatory and respiratory systems: Secondary | ICD-10-CM

## 2019-08-21 ENCOUNTER — Ambulatory Visit: Payer: Medicare HMO | Admitting: Cardiology

## 2019-08-21 ENCOUNTER — Telehealth: Payer: Self-pay | Admitting: Physician Assistant

## 2019-08-21 NOTE — Progress Notes (Addendum)
Virtual Visit via Telephone Note   This visit type was conducted due to national recommendations for restrictions regarding the COVID-19 Pandemic (e.g. social distancing) in an effort to limit this patient's exposure and mitigate transmission in our community.  Due to his co-morbid illnesses, this patient is at least at moderate risk for complications without adequate follow up.  This format is felt to be most appropriate for this patient at this time.  The patient did not have access to video technology/had technical difficulties with video requiring transitioning to audio format only (telephone).  All issues noted in this document were discussed and addressed.  No physical exam could be performed with this format.  Please refer to the patient's chart for his  consent to telehealth for [facility]   Date:  08/22/2019   ID:  [name], DOB 09/21/1949, MRN [id number]  Patient Location: Home Provider Location: Office  PCP:  [facility]  Cardiologist:  Debbe Odea, MD  Electrophysiologist:  None   Evaluation Performed:  Follow-Up Visit  Chief Complaint: Follow-up  History of Present Illness:    [name] is a 69 y.o. male with aortic stenosis, HTN, HLD, GI bleed with acute blood loss anemia requiring 6 units of packed red blood cells in 12/2012, schizophrenia, bipolar disorder, and current tobacco use who presents for follow-up of previously noted murmur.  He was admitted in 2014 for syncope with echo at that time demonstrated a hyperdynamic LV systolic function with an EF of 70 to 75%, normal RVSF and cavity size, trace mitral regurgitation, trivial tricuspid regurgitation, normal in size and structure aortic root and ascending aorta.  Carotid artery ultrasound showed no evidence of hemodynamically significant stenosis.  Nuclear medicine bleeding scan showed findings concerning for active hemorrhage/extravasation into the region of the ascending colon and  hepatic flexure.  He was evaluated by GI and vascular surgery and underwent mesenteric artery embolization to the right colic and hepatic flexure branch.  EGD showed a normal esophagus with gastritis and normal exam duodenum.  Colonoscopy showed mucosal ulceration.  Patient's PCP noticed a murmur and carotid bruit on prior exam with recommendation to refer to cardiology.  He was evaluated by Dr. Azucena Cecil on 06/19/2019.  With relative hypotension with BP of 90/60 his lisinopril was stopped.  Echo on 07/19/2019 showed an EF of 60 to 65%, moderate LVH, no regional wall motion abnormalities, normal RVSF and cavity size, trivial pericardial effusion, mild to moderate aortic valve stenosis.  Carotid artery ultrasound that same day showed 1 to 39% bilateral ICA stenosis with antegrade flow of the bilateral vertebral arteries and normal flow hemodynamics of the bilateral subclavian arteries.  Patient is seen virtually today with the assistance of his medical assistant, Marcello Moores.  Both patient and caregiver indicate the patient is doing well and denies any chest pain, shortness of breath, dizziness, presyncope, syncope, lower extremity swelling, falls, hematochezia, or melena.  Blood pressure remains well controlled.  He is tolerating all medications without issues.  He does not have any issues or concerns at this time.   Labs independently reviewed: 04/2019 - BUN 15, serum creatinine 0.93, potassium 4.4, albumin 4.0, AST/LT normal, TC 132, TG 97, HDL 38, LDL 76 12/2015 -TSH normal, Hgb 14.4, PLT 113  The patient does not have symptoms concerning for COVID-19 infection (fever, chills, cough, or new shortness of breath).    Past Medical History:  Diagnosis Date  . Bipolar disorder (HCC)   . Depression   . Hyperlipidemia   .  Hypertension   . Schizophrenia (HCC)    No past surgical history on file.   Current Meds  Medication Sig  . amLODipine (NORVASC) 10 MG tablet TAKE 1 TABLET BY MOUTH DAILY  .  Cholecalciferol (VITAMIN D3) 1000 units CAPS Take 1 capsule (1,000 Units total) by mouth daily.  . citalopram (CELEXA) 40 MG tablet Take 1 tablet (40 mg total) by mouth daily.  . fluPHENAZine (PROLIXIN) 5 MG tablet Take 1 tablet (5 mg total) by mouth daily.  Marland Kitchen lisinopril (ZESTRIL) 20 MG tablet Take 20 mg by mouth daily.  . risperiDONE (RISPERDAL) 1 MG tablet Take 1 tablet (1 mg total) by mouth daily.  . simvastatin (ZOCOR) 20 MG tablet TAKE 1 TABLET BY MOUTH DAILY  . tamsulosin (FLOMAX) 0.4 MG CAPS capsule TAKE 1 CAPSULE BY MOUTH DAILY AFTER SUPPER  . traZODone (DESYREL) 50 MG tablet Take 1 tablet (50 mg total) by mouth at bedtime.     Allergies:   Asa [aspirin]   Social History   Tobacco Use  . Smoking status: Current Every Day Smoker    Packs/day: 1.00    Types: Cigarettes  . Smokeless tobacco: Never Used  Substance Use Topics  . Alcohol use: No    Alcohol/week: 0.0 standard drinks  . Drug use: No     Family Hx: The patient's Family history is unknown by patient.  ROS:   Please see the history of present illness.     All other systems reviewed and are negative.   Prior CV studies:   The following studies were reviewed today:  2D echo 06/2019: 1. Left ventricular ejection fraction, by visual estimation, is 60 to  65%. The left ventricle has normal function. There is moderately increased  left ventricular hypertrophy.  2. The left ventricle has no regional wall motion abnormalities.  3. Global right ventricle has normal systolic function.The right  ventricular size is normal. Right vetricular wall thickness was not  assessed.  4. Left atrial size was normal.  5. Right atrial size was normal.  6. Trivial pericardial effusion is present.  7. The mitral valve is normal in structure. No evidence of mitral valve  regurgitation.  8. The tricuspid valve is normal in structure.  9. The aortic valve was not well visualized. Aortic valve regurgitation  is not  visualized. Mild to moderate aortic valve stenosis.  10. Aortic valve not fully visualized but appears restricted. DVI is 0.36.  Mild to moderate aortic stenosis with PG , mean gradient .  11. The pulmonic valve was not well visualized. Pulmonic valve  regurgitation is not visualized.  12. The inferior vena cava is dilated in size with >50% respiratory  variability, suggesting right atrial pressure of 8 mmHg.  __________  Carotid artery ultrasound 06/2019: Right Carotid: Velocities in the right ICA are consistent with a 1-39%  stenosis.   Left Carotid: Velocities in the left ICA are consistent with a 1-39%  stenosis.   Labs/Other Tests and Data Reviewed:    EKG:  No ECG reviewed.  Recent Labs: No results found for requested labs within last 8760 hours.   Recent Lipid Panel Lab Results  Component Value Date/Time   CHOL 108 01/14/2016 11:55 AM   CHOL 78 01/04/2013 04:34 AM   TRIG 36 01/14/2016 11:55 AM   TRIG 100 01/04/2013 04:34 AM   HDL 40 01/14/2016 11:55 AM   HDL 21 (L) 01/04/2013 04:34 AM   CHOLHDL 3.1 08/28/2015 11:32 AM   LDLCALC 61 01/14/2016 11:55  AM   LDLCALC 37 01/04/2013 04:34 AM    Wt Readings from Last 3 Encounters:  08/22/19 198 lb (89.8 kg)  06/19/19 207 lb 4 oz (94 kg)  08/04/16 196 lb 9.6 oz (89.2 kg)     Objective:    Vital Signs:  BP 128/82   Ht 5\' 9"  (1.753 m)   Wt 198 lb (89.8 kg)   BMI 29.24 kg/m    VITAL SIGNS:  reviewed  ASSESSMENT & PLAN:    1. Aortic stenosis: Mild to moderate by recent echo as outlined above.  This also likely explains his carotid bruit noted on prior exam as his carotid artery ultrasound was unrevealing.  Asymptomatic.  Plan for repeat echo in 06/2020.  2. HTN: Blood pressure well controlled.  Continue current medications.  Followed by PCP.  3. HLD: Most recent LDL of 76 from 04/2019 with normal liver function at that time .  Continue statin as managed by PCP.  4. Colon cancer with prior GI bleed:  Patient's recent bleed is less likely secondary to Heyde's syndrome given his aortic stenosis is only mild to moderate at this time.  No further symptoms of bleeding.  COVID-19 Education: The signs and symptoms of COVID-19 were discussed with the patient and how to seek care for testing (follow up with PCP or arrange E-visit).  The importance of social distancing was discussed today.  Time:   Today, I have spent 10 minutes with the patient with telehealth technology discussing the above problems.     Medication Adjustments/Labs and Tests Ordered: Current medicines are reviewed at length with the patient today.  Concerns regarding medicines are outlined above.   Tests Ordered: No orders of the defined types were placed in this encounter.   Medication Changes: No orders of the defined types were placed in this encounter.   Follow Up:  In Person in 1 year(s)  Signed, Christell Faith, PA-C  08/22/2019 8:37 AM    [place]

## 2019-08-21 NOTE — Telephone Encounter (Signed)
Virtual Visit Pre-Appointment Phone Call  "(Name), I am calling you today to discuss your upcoming appointment. We are currently trying to limit exposure to the virus that causes COVID-19 by seeing patients at home rather than in the office."  1. "What is the BEST phone number to call the day of the visit?" - include this in appointment notes  2. "Do you have or have access to (through a family member/friend) a smartphone with video capability that we can use for your visit?" a. If yes - list this number in appt notes as "cell" (if different from BEST phone #) and list the appointment type as a VIDEO visit in appointment notes b. If no - list the appointment type as a PHONE visit in appointment notes  3. Confirm consent - "In the setting of the current Covid19 crisis, you are scheduled for a (phone or video) visit with your provider on (date) at (time).  Just as we do with many in-office visits, in order for you to participate in this visit, we must obtain consent.  If you'd like, I can send this to your mychart (if signed up) or email for you to review.  Otherwise, I can obtain your verbal consent now.  All virtual visits are billed to your insurance company just like a normal visit would be.  By agreeing to a virtual visit, we'd like you to understand that the technology does not allow for your provider to perform an examination, and thus may limit your provider's ability to fully assess your condition. If your provider identifies any concerns that need to be evaluated in person, we will make arrangements to do so.  Finally, though the technology is pretty good, we cannot assure that it will always work on either your or our end, and in the setting of a video visit, we may have to convert it to a phone-only visit.  In either situation, we cannot ensure that we have a secure connection.  Are you willing to proceed?" STAFF: Did the patient verbally acknowledge consent to telehealth visit? Document  YES/NO here: YES  4. Advise patient to be prepared - "Two hours prior to your appointment, go ahead and check your blood pressure, pulse, oxygen saturation, and your weight (if you have the equipment to check those) and write them all down. When your visit starts, your provider will ask you for this information. If you have an Apple Watch or Kardia device, please plan to have heart rate information ready on the day of your appointment. Please have a pen and paper handy nearby the day of the visit as well."  5. Give patient instructions for MyChart download to smartphone OR [email or web address] as below if video visit (depending on what platform provider is using)  6. Inform patient they will receive a phone call 15 minutes prior to their appointment time (may be from unknown caller ID) so they should be prepared to answer    TELEPHONE CALL NOTE  Leemon Heacock has been deemed a candidate for a follow-up tele-health visit to limit community exposure during the Covid-19 pandemic. I spoke with the patient via phone to ensure availability of phone/video source, confirm preferred email & phone number, and discuss instructions and expectations.  I reminded Renee Wolfman to be prepared with any vital sign and/or heart rhythm information that could potentially be obtained via home monitoring, at the time of his visit. I reminded Luiz Trumpower to expect a phone call prior to his visit.  [name] 08/21/2019 9:00 AM   INSTRUCTIONS FOR DOWNLOADING THE MYCHART APP TO SMARTPHONE  - The patient must first make sure to have activated MyChart and know their login information - If Apple, go to [employer] and type in MyChart in the search bar and download the app. If Android, ask patient to go to [employer] and type in [place] in the search bar and download the app. The app is free but as with any other app downloads, their phone may require them to verify saved payment information or Apple/Android  password.  - The patient will need to then log into the app with their MyChart username and password, and select [facility] as their healthcare provider to link the account. When it is time for your visit, go to the MyChart app, find appointments, and click Begin Video Visit. Be sure to Select Allow for your device to access the Microphone and Camera for your visit. You will then be connected, and your provider will be with you shortly.  **If they have any issues connecting, or need assistance please contact MyChart service desk [phone])**  **If using a computer, in order to ensure the best quality for their visit they will need to use either of the following Internet Browsers: [employer] or Google Chrome   IF USING DOXIMITY or DOXY.ME - The patient will receive a link just prior to their visit by text.     FULL LENGTH CONSENT FOR TELE-HEALTH VISIT   I hereby voluntarily request, consent and authorize [facility] and its employed or contracted physicians, physician assistants, nurse practitioners or other licensed health care professionals (the Practitioner), to provide me with telemedicine health care services (the "Services") as deemed necessary by the treating Practitioner. I acknowledge and consent to receive the Services by the Practitioner via telemedicine. I understand that the telemedicine visit will involve communicating with the Practitioner through live audiovisual communication technology and the disclosure of certain medical information by electronic transmission. I acknowledge that I have been given the opportunity to request an in-person assessment or other available alternative prior to the telemedicine visit and am voluntarily participating in the telemedicine visit.  I understand that I have the right to withhold or withdraw my consent to the use of telemedicine in the course of my care at any time, without affecting my right to future care or treatment,  and that the Practitioner or I may terminate the telemedicine visit at any time. I understand that I have the right to inspect all information obtained and/or recorded in the course of the telemedicine visit and may receive copies of available information for a reasonable fee.  I understand that some of the potential risks of receiving the Services via telemedicine include:  Marland Kitchen Delay or interruption in medical evaluation due to technological equipment failure or disruption; . Information transmitted may not be sufficient (e.g. poor resolution of images) to allow for appropriate medical decision making by the Practitioner; and/or  . In rare instances, security protocols could fail, causing a breach of personal health information.  Furthermore, I acknowledge that it is my responsibility to provide information about my medical history, conditions and care that is complete and accurate to the best of my ability. I acknowledge that Practitioner's advice, recommendations, and/or decision may be based on factors not within their control, such as incomplete or inaccurate data provided by me or distortions of diagnostic images or specimens that may result from electronic transmissions. I understand that the  practice of medicine is not an Visual merchandiser and that Practitioner makes no warranties or guarantees regarding treatment outcomes. I acknowledge that I will receive a copy of this consent concurrently upon execution via email to the email address I last provided but may also request a printed copy by calling the office of [facility].    I understand that my insurance will be billed for this visit.   I have read or had this consent read to me. . I understand the contents of this consent, which adequately explains the benefits and risks of the Services being provided via telemedicine.  . I have been provided ample opportunity to ask questions regarding this consent and the Services and have had my questions  answered to my satisfaction. . I give my informed consent for the services to be provided through the use of telemedicine in my medical care  By participating in this telemedicine visit I agree to the above.

## 2019-08-22 ENCOUNTER — Telehealth (INDEPENDENT_AMBULATORY_CARE_PROVIDER_SITE_OTHER): Payer: Medicare HMO | Admitting: Physician Assistant

## 2019-08-22 ENCOUNTER — Other Ambulatory Visit: Payer: Self-pay

## 2019-08-22 ENCOUNTER — Ambulatory Visit: Payer: Medicare HMO | Admitting: Psychiatry

## 2019-08-22 VITALS — BP 130/70 | HR 85 | Ht 69.0 in | Wt 198.0 lb

## 2019-08-22 DIAGNOSIS — E782 Mixed hyperlipidemia: Secondary | ICD-10-CM | POA: Diagnosis not present

## 2019-08-22 DIAGNOSIS — I350 Nonrheumatic aortic (valve) stenosis: Secondary | ICD-10-CM

## 2019-08-22 DIAGNOSIS — Z8719 Personal history of other diseases of the digestive system: Secondary | ICD-10-CM

## 2019-08-22 DIAGNOSIS — I10 Essential (primary) hypertension: Secondary | ICD-10-CM | POA: Diagnosis not present

## 2019-08-22 NOTE — Patient Instructions (Signed)
Medication Instructions:  Your physician recommends that you continue on your current medications as directed. Please refer to the Current Medication list given to you today.   *If you need a refill on your cardiac medications before your next appointment, please call your pharmacy*  Lab Work: None ordered  If you have labs (blood work) drawn today and your tests are completely normal, you will receive your results only by: Marland Kitchen MyChart Message (if you have MyChart) OR . A paper copy in the mail If you have any lab test that is abnormal or we need to change your treatment, we will call you to review the results.  Testing/Procedures: Echo  Please return to [facility] on ________(Dec 2021)______ at _______________ AM/PM for an Echocardiogram. Your physician has requested that you have an echocardiogram. Echocardiography is a painless test that uses sound waves to create images of your heart. It provides your doctor with information about the size and shape of your heart and how well your heart's chambers and valves are working. This procedure takes approximately one hour. There are no restrictions for this procedure. Please note; depending on visual quality an IV may need to be placed.    Follow-Up: At [facility], you and your health needs are our priority.  As part of our continuing mission to provide you with exceptional heart care, we have created designated Provider Care Teams.  These Care Teams include your primary Cardiologist (physician) and Advanced Practice Providers (APPs -  Physician Assistants and Nurse Practitioners) who all work together to provide you with the care you need, when you need it.  Your next appointment:   12 month(s)  The format for your next appointment:   In Person  Provider:    You may see Debbe Odea, MD or Eula Listen, PA-C.

## 2019-09-12 ENCOUNTER — Other Ambulatory Visit: Payer: Medicare HMO

## 2019-11-16 ENCOUNTER — Ambulatory Visit: Payer: Medicare HMO | Admitting: Urology

## 2019-11-16 ENCOUNTER — Telehealth: Payer: Medicare HMO | Admitting: Psychiatry

## 2019-11-28 ENCOUNTER — Other Ambulatory Visit: Payer: Self-pay

## 2019-11-28 ENCOUNTER — Telehealth (INDEPENDENT_AMBULATORY_CARE_PROVIDER_SITE_OTHER): Payer: Medicare HMO | Admitting: Psychiatry

## 2019-11-28 ENCOUNTER — Encounter: Payer: Self-pay | Admitting: Psychiatry

## 2019-11-28 DIAGNOSIS — F200 Paranoid schizophrenia: Secondary | ICD-10-CM | POA: Diagnosis not present

## 2019-11-28 MED ORDER — FLUPHENAZINE HCL 5 MG PO TABS: 5.0000 mg | ORAL_TABLET | Freq: Every day | ORAL | 2 refills | Status: DC

## 2019-11-28 MED ORDER — TRAZODONE HCL 50 MG PO TABS: 50.0000 mg | ORAL_TABLET | Freq: Every day | ORAL | 2 refills | Status: DC

## 2019-11-28 MED ORDER — RISPERIDONE 1 MG PO TABS: 1.0000 mg | ORAL_TABLET | Freq: Every day | ORAL | 2 refills | Status: DC

## 2019-11-28 MED ORDER — CITALOPRAM HYDROBROMIDE 40 MG PO TABS: 40.0000 mg | ORAL_TABLET | Freq: Every day | ORAL | 2 refills | Status: DC

## 2019-11-28 NOTE — Progress Notes (Signed)
[facility] MD OP Progress Note  I connected with  [name] on 11/28/19 by a video enabled telemedicine application and verified that I am speaking with the correct person using two identifiers.   I discussed the limitations of evaluation and management by telemedicine. The patient expressed understanding and agreed to proceed.    11/28/2019 8:52 AM [name]  MRN:  [id number]  Chief Complaint:  " I am doing well."  HPI: Pt resides in group home [facility]. He reported that he has been doing well. He has been compliant with his medications. He sleeps well at night. He denied any auditory or visual hallucinations. He denied any paranoid delusions. He appeared to be internally pre-occupied and displayed latency of speech. He denied any side effects to his medications. He smiled somewhat inappropriately on one occasion. The accompanying facility staff informed that pt is doing fine and is at his baseline.   Visit Diagnosis:    ICD-10-CM   1. Schizophrenia, paranoid ([place])  F20.0     Past Psychiatric History: Schizophrenia  Past Medical History:  Past Medical History:  Diagnosis Date  . Bipolar disorder ([place])   . Depression   . Hyperlipidemia   . Hypertension   . Schizophrenia ([place])    No past surgical history on file.  Family Psychiatric History: unknown  Family History:  Family History  Family history unknown: Yes    Social History:  Social History   Socioeconomic History  . Marital status: Single    Spouse name: Not on file  . Number of children: Not on file  . Years of education: Not on file  . Highest education level: Not on file  Occupational History  . Not on file  Tobacco Use  . Smoking status: Current Every Day Smoker    Packs/day: 1.00    Types: Cigarettes  . Smokeless tobacco: Never Used  Substance and Sexual Activity  . Alcohol use: No    Alcohol/week: 0.0 standard drinks  . Drug use: No  . Sexual activity: Never  Other Topics Concern  . Not on  file  Social History Narrative  . Not on file   Social Determinants of Health   Financial Resource Strain:   . Difficulty of Paying Living Expenses:   Food Insecurity:   . Worried About Charity fundraiser in the Last Year:   . Arboriculturist in the Last Year:   Transportation Needs:   . Film/video editor (Medical):   Marland Kitchen Lack of Transportation (Non-Medical):   Physical Activity:   . Days of Exercise per Week:   . Minutes of Exercise per Session:   Stress:   . Feeling of Stress :   Social Connections:   . Frequency of Communication with Friends and Family:   . Frequency of Social Gatherings with Friends and Family:   . Attends Religious Services:   . Active Member of Clubs or Organizations:   . Attends Archivist Meetings:   Marland Kitchen Marital Status:     Allergies:  Allergies  Allergen Reactions  . Asa [Aspirin]     Hx GI bleed    Metabolic Disorder Labs: Lab Results  Component Value Date   HGBA1C 5.2 01/01/2016   Lab Results  Component Value Date   PROLACTIN 27.8 (H) 01/14/2016   Lab Results  Component Value Date   CHOL 108 01/14/2016   TRIG 36 01/14/2016   HDL 40 01/14/2016   CHOLHDL 3.1 08/28/2015   VLDL 20  01/04/2013   LDLCALC 61 01/14/2016   LDLCALC 71 08/28/2015   Lab Results  Component Value Date   TSH 0.616 01/14/2016   TSH 0.593 08/28/2015    Therapeutic Level Labs: No results found for: LITHIUM No results found for: VALPROATE No components found for:  CBMZ  Current Medications: Current Outpatient Medications  Medication Sig Dispense Refill  . amLODipine (NORVASC) 10 MG tablet TAKE 1 TABLET BY MOUTH DAILY 30 tablet 5  . Cholecalciferol (VITAMIN D3) 1000 units CAPS Take 1 capsule (1,000 Units total) by mouth daily. 90 capsule 3  . citalopram (CELEXA) 40 MG tablet Take 1 tablet (40 mg total) by mouth daily. 30 tablet 2  . fluPHENAZine (PROLIXIN) 5 MG tablet Take 1 tablet (5 mg total) by mouth daily. 60 tablet 2  . lisinopril  (ZESTRIL) 20 MG tablet Take 20 mg by mouth daily.    . risperiDONE (RISPERDAL) 1 MG tablet Take 1 tablet (1 mg total) by mouth daily. 30 tablet 2  . simvastatin (ZOCOR) 20 MG tablet TAKE 1 TABLET BY MOUTH DAILY 30 tablet 5  . tamsulosin (FLOMAX) 0.4 MG CAPS capsule TAKE 1 CAPSULE BY MOUTH DAILY AFTER SUPPER 90 capsule 3  . traZODone (DESYREL) 50 MG tablet Take 1 tablet (50 mg total) by mouth at bedtime. 30 tablet 2   No current facility-administered medications for this visit.     Musculoskeletal: Strength & Muscle Tone: unable to assess due to phone visit  Gait & Station: unable to assess due to phone visit Patient leans: unable to assess due to phone visit  Psychiatric Specialty Exam: ROS  There were no vitals taken for this visit.There is no height or weight on file to calculate BMI.  General Appearance: Disheveled  Eye Contact:  Good  Speech:  Clear and Coherent and Normal Rate  Volume:  Normal  Mood:  Euthymic  Affect:  Inappropriate smiling noted  Thought Process:  Disorganized and Descriptions of Associations: Intact  Orientation:  Full (Time, Place, and Person)  Thought Content: Logical, internally pre-occupied  Suicidal Thoughts:  No  Homicidal Thoughts:  No  Memory:  Recent;   Good Remote;   Good  Judgement:  Fair  Insight:  Fair  Psychomotor Activity:  Normal  Concentration:  Concentration: Good and Attention Span: Fair  Recall:  Good  Fund of Knowledge: Good  Language: Good  Akathisia:  No  Handed:  Right  AIMS (if indicated): unable to assess due to telemed visit  Assets:  Communication Skills Desire for Improvement Financial Resources/Insurance Housing Social Support  ADL's:  Intact  Cognition: WNL  Sleep:  Good   Screenings: AIMS     [facility] Visit [facility] 08/19/2016 in [facility] Office Visit from 11/19/2015 in [facility]  AIMS Total Score  0  0    PHQ2-9     Office Visit from 08/28/2015 in  [facility]  PHQ-2 Total Score  0       Assessment and Plan: 69 y/o male with hx of Schizophrenia residing in a group home doing well on his current medication regimen.  1. Schizophrenia, paranoid ([facility]) - citalopram (CELEXA) 40 MG tablet; Take 1 tablet (40 mg total) by mouth daily.  Dispense: 30 tablet; Refill: 2 - risperiDONE (RISPERDAL) 1 MG tablet; Take 1 tablet (1 mg total) by mouth daily.  Dispense: 30 tablet; Refill: 2 - fluPHENAZine (PROLIXIN) 5 MG tablet; Take 1 tablet (5 mg total) by mouth daily.  Dispense: 60 tablet;  Refill: 2 - traZODone (DESYREL) 50 MG tablet; Take 1 tablet (50 mg total) by mouth at bedtime.  Dispense: 30 tablet; Refill: 2  Continue current medications. F/up in 3 months.    Zena Amos, MD 11/28/2019, 8:52 AM

## 2020-02-22 ENCOUNTER — Telehealth: Payer: Medicare HMO | Admitting: Psychiatry

## 2020-02-22 ENCOUNTER — Telehealth (HOSPITAL_COMMUNITY): Payer: Medicare HMO | Admitting: Psychiatry

## 2020-03-11 ENCOUNTER — Other Ambulatory Visit: Payer: Self-pay

## 2020-03-11 ENCOUNTER — Encounter (HOSPITAL_COMMUNITY): Payer: Self-pay | Admitting: Psychiatry

## 2020-03-11 ENCOUNTER — Telehealth (INDEPENDENT_AMBULATORY_CARE_PROVIDER_SITE_OTHER): Payer: Medicare HMO | Admitting: Psychiatry

## 2020-03-11 DIAGNOSIS — F200 Paranoid schizophrenia: Secondary | ICD-10-CM | POA: Diagnosis not present

## 2020-03-11 MED ORDER — RISPERIDONE 1 MG PO TABS: 1.0000 mg | ORAL_TABLET | Freq: Every day | ORAL | 2 refills | Status: DC

## 2020-03-11 MED ORDER — CITALOPRAM HYDROBROMIDE 40 MG PO TABS: 40.0000 mg | ORAL_TABLET | Freq: Every day | ORAL | 2 refills | Status: DC

## 2020-03-11 MED ORDER — TRAZODONE HCL 50 MG PO TABS: 50.0000 mg | ORAL_TABLET | Freq: Every day | ORAL | 2 refills | Status: DC

## 2020-03-11 MED ORDER — FLUPHENAZINE HCL 5 MG PO TABS: 5.0000 mg | ORAL_TABLET | Freq: Every day | ORAL | 2 refills | Status: DC

## 2020-03-11 NOTE — Progress Notes (Signed)
[facility] MD OP Progress Note  Virtual Visit via Telephone Note  I connected with [name] on 03/11/20 at 10:20 AM EDT by telephone and verified that I am speaking with the correct person using two identifiers.  Location: Patient: home Provider: Clinic   I discussed the limitations, risks, security and privacy concerns of performing an evaluation and management service by telephone and the availability of in person appointments. I also discussed with the patient that there may be a patient responsible charge related to this service. The patient expressed understanding and agreed to proceed.   I provided 14 minutes of non-face-to-face time during this encounter.     03/11/2020 10:53 AM [name]  MRN:  [id number]  Chief Complaint:  " I am doing good."  HPI: Pt resides in group home named [facility]. He reported doing well. He denied any hallucinations or paranoid delusions. He reported that he is sleeping well at night. He has not been able to visit his family much recently. The group home owner Mr. Nehme informed that pt has bene stable and stated that he has no concerns regarding him or his medications.   Visit Diagnosis:    ICD-10-CM   1. Schizophrenia, paranoid ([facility])  F20.0     Past Psychiatric History: Schizophrenia  Past Medical History:  Past Medical History:  Diagnosis Date  . Bipolar disorder ([facility])   . Depression   . Hyperlipidemia   . Hypertension   . Schizophrenia ([facility])    No past surgical history on file.  Family Psychiatric History: unknown  Family History:  Family History  Family history unknown: Yes    Social History:  Social History   Socioeconomic History  . Marital status: Single    Spouse name: Not on file  . Number of children: Not on file  . Years of education: Not on file  . Highest education level: Not on file  Occupational History  . Not on file  Tobacco Use  . Smoking status: Current Every Day Smoker    Packs/day: 1.00     Types: Cigarettes  . Smokeless tobacco: Never Used  Vaping Use  . Vaping Use: Never used  Substance and Sexual Activity  . Alcohol use: No    Alcohol/week: 0.0 standard drinks  . Drug use: No  . Sexual activity: Never  Other Topics Concern  . Not on file  Social History Narrative  . Not on file   Social Determinants of Health   Financial Resource Strain:   . Difficulty of Paying Living Expenses: Not on file  Food Insecurity:   . Worried About Programme researcher, broadcasting/film/video in the Last Year: Not on file  . Ran Out of Food in the Last Year: Not on file  Transportation Needs:   . Lack of Transportation (Medical): Not on file  . Lack of Transportation (Non-Medical): Not on file  Physical Activity:   . Days of Exercise per Week: Not on file  . Minutes of Exercise per Session: Not on file  Stress:   . Feeling of Stress : Not on file  Social Connections:   . Frequency of Communication with Friends and Family: Not on file  . Frequency of Social Gatherings with Friends and Family: Not on file  . Attends Religious Services: Not on file  . Active Member of Clubs or Organizations: Not on file  . Attends Banker Meetings: Not on file  . Marital Status: Not on file    Allergies:  Allergies  Allergen Reactions  . Asa [Aspirin]     Hx GI bleed    Metabolic Disorder Labs: Lab Results  Component Value Date   HGBA1C 5.2 01/01/2016   Lab Results  Component Value Date   PROLACTIN 27.8 (H) 01/14/2016   Lab Results  Component Value Date   CHOL 108 01/14/2016   TRIG 36 01/14/2016   HDL 40 01/14/2016   CHOLHDL 3.1 08/28/2015   VLDL 20 01/04/2013   LDLCALC 61 01/14/2016   LDLCALC 71 08/28/2015   Lab Results  Component Value Date   TSH 0.616 01/14/2016   TSH 0.593 08/28/2015    Therapeutic Level Labs: No results found for: LITHIUM No results found for: VALPROATE No components found for:  CBMZ  Current Medications: Current Outpatient Medications  Medication Sig  Dispense Refill  . amLODipine (NORVASC) 10 MG tablet TAKE 1 TABLET BY MOUTH DAILY 30 tablet 5  . Cholecalciferol (VITAMIN D3) 1000 units CAPS Take 1 capsule (1,000 Units total) by mouth daily. 90 capsule 3  . citalopram (CELEXA) 40 MG tablet Take 1 tablet (40 mg total) by mouth daily. 30 tablet 2  . fluPHENAZine (PROLIXIN) 5 MG tablet Take 1 tablet (5 mg total) by mouth daily. 60 tablet 2  . lisinopril (ZESTRIL) 20 MG tablet Take 20 mg by mouth daily.    . risperiDONE (RISPERDAL) 1 MG tablet Take 1 tablet (1 mg total) by mouth daily. 30 tablet 2  . simvastatin (ZOCOR) 20 MG tablet TAKE 1 TABLET BY MOUTH DAILY 30 tablet 5  . tamsulosin (FLOMAX) 0.4 MG CAPS capsule TAKE 1 CAPSULE BY MOUTH DAILY AFTER SUPPER 90 capsule 3  . traZODone (DESYREL) 50 MG tablet Take 1 tablet (50 mg total) by mouth at bedtime. 30 tablet 2   No current facility-administered medications for this visit.     Musculoskeletal: Strength & Muscle Tone: unable to assess due to phone visit  Gait & Station: unable to assess due to phone visit Patient leans: unable to assess due to phone visit  Psychiatric Specialty Exam: ROS  There were no vitals taken for this visit.There is no height or weight on file to calculate BMI.  General Appearance: unable to assess due to phone visit  Eye Contact:  unable to assess due to phone visit  Speech:  Clear and Coherent and Normal Rate  Volume:  Normal  Mood:  Euthymic  Affect:  Congruent  Thought Process:  Disorganized and Descriptions of Associations: Intact  Orientation:  Full (Time, Place, and Person)  Thought Content: Goal directed  Suicidal Thoughts:  No  Homicidal Thoughts:  No  Memory:  Recent;   Good Remote;   Good  Judgement:  Fair  Insight:  Fair  Psychomotor Activity:  Normal  Concentration:  Concentration: Good and Attention Span: Fair  Recall:  Good  Fund of Knowledge: Good  Language: Good  Akathisia:  No  Handed:  Right  AIMS (if indicated): unable to assess  due to telemed visit  Assets:  Communication Skills Desire for Improvement Financial Resources/Insurance Housing Social Support  ADL's:  Intact  Cognition: WNL  Sleep:  Good   Screenings: AIMS     [facility] Visit from 08/19/2016 in [facility] Office Visit from 11/19/2015 in [facility]  AIMS Total Score 0 0    PHQ2-9     Office Visit from 08/28/2015 in [facility]  PHQ-2 Total Score 0       Assessment and Plan: 69 y/o male  with hx of Schizophrenia residing in a group home doing well on his current medication regimen.  1. Schizophrenia, paranoid (HCC) - citalopram (CELEXA) 40 MG tablet; Take 1 tablet (40 mg total) by mouth daily.  Dispense: 30 tablet; Refill: 2 - risperiDONE (RISPERDAL) 1 MG tablet; Take 1 tablet (1 mg total) by mouth daily.  Dispense: 30 tablet; Refill: 2 - fluPHENAZine (PROLIXIN) 5 MG tablet; Take 1 tablet (5 mg total) by mouth daily.  Dispense: 60 tablet; Refill: 2 - traZODone (DESYREL) 50 MG tablet; Take 1 tablet (50 mg total) by mouth at bedtime.  Dispense: 30 tablet; Refill: 2  Continue current medications. F/up in 3 months.    Zena Amos, MD 03/11/2020, 10:53 AM

## 2020-06-05 ENCOUNTER — Telehealth (INDEPENDENT_AMBULATORY_CARE_PROVIDER_SITE_OTHER): Payer: Medicare HMO | Admitting: Psychiatry

## 2020-06-05 ENCOUNTER — Other Ambulatory Visit: Payer: Self-pay

## 2020-06-05 ENCOUNTER — Encounter (HOSPITAL_COMMUNITY): Payer: Self-pay | Admitting: Psychiatry

## 2020-06-05 DIAGNOSIS — F200 Paranoid schizophrenia: Secondary | ICD-10-CM | POA: Diagnosis not present

## 2020-06-05 MED ORDER — CITALOPRAM HYDROBROMIDE 40 MG PO TABS: 40.0000 mg | ORAL_TABLET | Freq: Every day | ORAL | 2 refills | Status: DC

## 2020-06-05 MED ORDER — FLUPHENAZINE HCL 5 MG PO TABS: 5.0000 mg | ORAL_TABLET | Freq: Every day | ORAL | 2 refills | Status: DC

## 2020-06-05 MED ORDER — RISPERIDONE 1 MG PO TABS: 1.0000 mg | ORAL_TABLET | Freq: Every day | ORAL | 2 refills | Status: DC

## 2020-06-05 MED ORDER — TRAZODONE HCL 50 MG PO TABS: 50.0000 mg | ORAL_TABLET | Freq: Every day | ORAL | 2 refills | Status: DC

## 2020-06-05 NOTE — Progress Notes (Signed)
[facility] MD OP Progress Note  Virtual Visit via Video Note  I connected with [name] on 06/05/20 at 11:00 AM EST by a video enabled telemedicine application and verified that I am speaking with the correct person using two identifiers.  Location: Patient: Home Provider: Clinic   I discussed the limitations of evaluation and management by telemedicine and the availability of in person appointments. The patient expressed understanding and agreed to proceed.  I provided 15 minutes of non-face-to-face time during this encounter.    06/05/2020 11:42 AM Tamar Lipscomb  MRN:  [id number]  Chief Complaint:  " I am fine.""  HPI: Pt resides in group home named [facility]. Pt was seen with a staff member. Pt denied any issues or concerns at this time. He is sleeping well at night. He denied any hallucinations or delusions. Staff denied any concerns regarding the pt.  Pt replied when asked if he would be visiting his family for the upcoming holidays.  Visit Diagnosis:    ICD-10-CM   1. Schizophrenia, paranoid (HCC)  F20.0     Past Psychiatric History: Schizophrenia  Past Medical History:  Past Medical History:  Diagnosis Date  . Bipolar disorder ([facility])   . Depression   . Hyperlipidemia   . Hypertension   . Schizophrenia (H[facility])    No past surgical history on file.  Family Psychiatric History: unknown  Family History:  Family History  Family history unknown: Yes    Social History:  Social History   Socioeconomic History  . Marital status: Single    Spouse name: Not on file  . Number of children: Not on file  . Years of education: Not on file  . Highest education level: Not on file  Occupational History  . Not on file  Tobacco Use  . Smoking status: Current Every Day Smoker    Packs/day: 1.00    Types: Cigarettes  . Smokeless tobacco: Never Used  Vaping Use  . Vaping Use: Never used  Substance and Sexual Activity  . Alcohol use: No    Alcohol/week: 0.0 standard  drinks  . Drug use: No  . Sexual activity: Never  Other Topics Concern  . Not on file  Social History Narrative  . Not on file   Social Determinants of Health   Financial Resource Strain:   . Difficulty of Paying Living Expenses: Not on file  Food Insecurity:   . Worried About Programme researcher, broadcasting/film/video in the Last Year: Not on file  . Ran Out of Food in the Last Year: Not on file  Transportation Needs:   . Lack of Transportation (Medical): Not on file  . Lack of Transportation (Non-Medical): Not on file  Physical Activity:   . Days of Exercise per Week: Not on file  . Minutes of Exercise per Session: Not on file  Stress:   . Feeling of Stress : Not on file  Social Connections:   . Frequency of Communication with Friends and Family: Not on file  . Frequency of Social Gatherings with Friends and Family: Not on file  . Attends Religious Services: Not on file  . Active Member of Clubs or Organizations: Not on file  . Attends Banker Meetings: Not on file  . Marital Status: Not on file    Allergies:  Allergies  Allergen Reactions  . Asa [Aspirin]     Hx GI bleed    Metabolic Disorder Labs: Lab Results  Component Value Date   HGBA1C 5.2  01/01/2016   Lab Results  Component Value Date   PROLACTIN 27.8 (H) 01/14/2016   Lab Results  Component Value Date   CHOL 108 01/14/2016   TRIG 36 01/14/2016   HDL 40 01/14/2016   CHOLHDL 3.1 08/28/2015   VLDL 20 01/04/2013   LDLCALC 61 01/14/2016   LDLCALC 71 08/28/2015   Lab Results  Component Value Date   TSH 0.616 01/14/2016   TSH 0.593 08/28/2015    Therapeutic Level Labs: No results found for: LITHIUM No results found for: VALPROATE No components found for:  CBMZ  Current Medications: Current Outpatient Medications  Medication Sig Dispense Refill  . amLODipine (NORVASC) 10 MG tablet TAKE 1 TABLET BY MOUTH DAILY 30 tablet 5  . Cholecalciferol (VITAMIN D3) 1000 units CAPS Take 1 capsule (1,000 Units total)  by mouth daily. 90 capsule 3  . citalopram (CELEXA) 40 MG tablet Take 1 tablet (40 mg total) by mouth daily. 30 tablet 2  . fluPHENAZine (PROLIXIN) 5 MG tablet Take 1 tablet (5 mg total) by mouth daily. 60 tablet 2  . lisinopril (ZESTRIL) 20 MG tablet Take 20 mg by mouth daily.    . risperiDONE (RISPERDAL) 1 MG tablet Take 1 tablet (1 mg total) by mouth daily. 30 tablet 2  . simvastatin (ZOCOR) 20 MG tablet TAKE 1 TABLET BY MOUTH DAILY 30 tablet 5  . tamsulosin (FLOMAX) 0.4 MG CAPS capsule TAKE 1 CAPSULE BY MOUTH DAILY AFTER SUPPER 90 capsule 3  . traZODone (DESYREL) 50 MG tablet Take 1 tablet (50 mg total) by mouth at bedtime. 30 tablet 2   No current facility-administered medications for this visit.     Musculoskeletal: Strength & Muscle Tone: unable to assess due to phone visit  Gait & Station: unable to assess due to phone visit Patient leans: unable to assess due to phone visit  Psychiatric Specialty Exam: ROS  There were no vitals taken for this visit.There is no height or weight on file to calculate BMI.  General Appearance: Fairly groomed  Eye Contact:  good  Speech:  Clear and Coherent and Normal Rate  Volume:  Normal  Mood:  Euthymic, smiling inappropriately at times  Affect:  Congruent  Thought Process:  Disorganized and Descriptions of Associations: Intact  Orientation:  Full (Time, Place, and Person)  Thought Content: Goal directed  Suicidal Thoughts:  No  Homicidal Thoughts:  No  Memory:  Recent;   Good Remote;   Good  Judgement:  Fair  Insight:  Fair  Psychomotor Activity:  Normal  Concentration:  Concentration: Good and Attention Span: Fair  Recall:  Good  Fund of Knowledge: Good  Language: Good  Akathisia:  No  Handed:  Right  AIMS (if indicated): unable to assess due to telemed visit  Assets:  Communication Skills Desire for Improvement Financial Resources/Insurance Housing Social Support  ADL's:  Intact  Cognition: WNL  Sleep:  Good    Screenings: AIMS     [facility] Visit from 08/19/2016 in [facility] Office Visit from 11/19/2015 in [facility]  AIMS Total Score 0 0    PHQ2-9     Office Visit from 08/28/2015 in [facility]  PHQ-2 Total Score 0       Assessment and Plan: 69 y/o male with hx of Schizophrenia residing in a group home appears to be stable on his current medication regimen.  1. Schizophrenia, paranoid (HCC) - citalopram (CELEXA) 40 MG tablet; Take 1 tablet (40 mg total) by  mouth daily.  Dispense: 30 tablet; Refill: 2 - risperiDONE (RISPERDAL) 1 MG tablet; Take 1 tablet (1 mg total) by mouth daily.  Dispense: 30 tablet; Refill: 2 - fluPHENAZine (PROLIXIN) 5 MG tablet; Take 1 tablet (5 mg total) by mouth daily.  Dispense: 60 tablet; Refill: 2 - traZODone (DESYREL) 50 MG tablet; Take 1 tablet (50 mg total) by mouth at bedtime.  Dispense: 30 tablet; Refill: 2  Continue current medications. F/up in 3 months.    Zena Amos, MD 06/05/2020, 11:42 AM

## 2020-07-01 ENCOUNTER — Ambulatory Visit: Payer: Medicare HMO | Attending: Internal Medicine

## 2020-07-01 DIAGNOSIS — Z23 Encounter for immunization: Secondary | ICD-10-CM

## 2020-07-01 NOTE — Progress Notes (Signed)
   Covid-19 Vaccination Clinic  Name:  [name]    MRN: [id number] DOB: 1950-02-27  07/01/2020  [name] was observed post Covid-19 immunization for 15 minutes without incident. He was provided with Vaccine Information Sheet and instruction to access the V-Safe system.   [name] was instructed to call 911 with any severe reactions post vaccine: Marland Kitchen Difficulty breathing  . Swelling of face and throat  . A fast heartbeat  . A bad rash all over body  . Dizziness and weakness   Immunizations Administered    Name Date Dose VIS Date Route   Moderna COVID-19 Vaccine 07/01/2020  5:15 PM 0.5 mL 05/08/2020 Intramuscular   Manufacturer: Gala Murdoch   Lot: [id number]   NDC: 44967-591-63

## 2020-07-29 ENCOUNTER — Ambulatory Visit: Payer: Medicare HMO | Attending: Internal Medicine

## 2020-07-29 ENCOUNTER — Other Ambulatory Visit: Payer: Self-pay

## 2020-07-29 DIAGNOSIS — Z23 Encounter for immunization: Secondary | ICD-10-CM

## 2020-07-29 NOTE — Progress Notes (Signed)
   Covid-19 Vaccination Clinic  Name:  [name]    MRN: [id number] DOB: 07/26/1949  07/29/2020  [name] was observed post Covid-19 immunization for 15 minutes without incident. He was provided with Vaccine Information Sheet and instruction to access the V-Safe system.   [name] was instructed to call 911 with any severe reactions post vaccine: Marland Kitchen Difficulty breathing  . Swelling of face and throat  . A fast heartbeat  . A bad rash all over body  . Dizziness and weakness   Immunizations Administered    Name Date Dose VIS Date Route   Moderna COVID-19 Vaccine 07/29/2020  2:39 PM 0.5 mL 05/08/2020 Intramuscular   Manufacturer: Gala Murdoch   Lot: [id number]   NDC: 83662-947-65

## 2020-09-02 ENCOUNTER — Telehealth (HOSPITAL_COMMUNITY): Payer: Medicare HMO | Admitting: Psychiatry

## 2020-09-04 ENCOUNTER — Other Ambulatory Visit: Payer: Medicare HMO

## 2020-09-10 ENCOUNTER — Ambulatory Visit: Payer: Medicare HMO | Admitting: Cardiology

## 2020-09-18 ENCOUNTER — Encounter (HOSPITAL_COMMUNITY): Payer: Self-pay | Admitting: Psychiatry

## 2020-09-18 ENCOUNTER — Other Ambulatory Visit: Payer: Self-pay

## 2020-09-18 ENCOUNTER — Telehealth (INDEPENDENT_AMBULATORY_CARE_PROVIDER_SITE_OTHER): Payer: Medicare HMO | Admitting: Psychiatry

## 2020-09-18 DIAGNOSIS — F200 Paranoid schizophrenia: Secondary | ICD-10-CM

## 2020-09-18 MED ORDER — TRAZODONE HCL 50 MG PO TABS
50.0000 mg | ORAL_TABLET | Freq: Every day | ORAL | 3 refills | Status: DC
Start: 2020-09-18 — End: 2023-04-09

## 2020-09-18 MED ORDER — CITALOPRAM HYDROBROMIDE 40 MG PO TABS: 40.0000 mg | ORAL_TABLET | Freq: Every day | ORAL | 3 refills | Status: AC

## 2020-09-18 MED ORDER — FLUPHENAZINE HCL 5 MG PO TABS: 5.0000 mg | ORAL_TABLET | Freq: Every day | ORAL | 3 refills | Status: DC

## 2020-09-18 MED ORDER — RISPERIDONE 1 MG PO TABS: 1.0000 mg | ORAL_TABLET | Freq: Every day | ORAL | 3 refills | Status: AC

## 2020-09-18 NOTE — Progress Notes (Signed)
BH MD OP Progress Note  Virtual Visit via Video Note  I connected with [name] on 09/18/20 at 11:00 AM EST by a video enabled telemedicine application and verified that I am speaking with the correct person using two identifiers.  Location: Patient: Group Home Provider: Clinic   I discussed the limitations of evaluation and management by telemedicine and the availability of in person appointments. The patient expressed understanding and agreed to proceed.  I provided 13 minutes of non-face-to-face time during this encounter.      09/18/2020 11:25 AM Jeffree Cazeau  MRN:  [id number]  Chief Complaint:  " Doing all right."  HPI: Pt resides in group home named [facility]. Pt was seen with a staff member.  Patient was noted to be laying in his bed when approached by the staff.  He reported that he is able to sleep well at night.  He denied any auditory or visual hallucinations.  He denied any suicidal homicidal ideations. He denied any acute issues or concerns pertaining to his medications. When asked if he was looking forward to anything exciting he said not really. The staff member informed that they had no concerns about the patient either.   Visit Diagnosis:    ICD-10-CM   1. Schizophrenia, paranoid (HCC)  F20.0     Past Psychiatric History: Schizophrenia  Past Medical History:  Past Medical History:  Diagnosis Date  . Bipolar disorder (HCC)   . Depression   . Hyperlipidemia   . Hypertension   . Schizophrenia (HCC)    No past surgical history on file.  Family Psychiatric History: unknown  Family History:  Family History  Family history unknown: Yes    Social History:  Social History   Socioeconomic History  . Marital status: Single    Spouse name: Not on file  . Number of children: Not on file  . Years of education: Not on file  . Highest education level: Not on file  Occupational History  . Not on file  Tobacco Use  . Smoking status: Current  Every Day Smoker    Packs/day: 1.00    Types: Cigarettes  . Smokeless tobacco: Never Used  Vaping Use  . Vaping Use: Never used  Substance and Sexual Activity  . Alcohol use: No    Alcohol/week: 0.0 standard drinks  . Drug use: No  . Sexual activity: Never  Other Topics Concern  . Not on file  Social History Narrative  . Not on file   Social Determinants of Health   Financial Resource Strain: Not on file  Food Insecurity: Not on file  Transportation Needs: Not on file  Physical Activity: Not on file  Stress: Not on file  Social Connections: Not on file    Allergies:  Allergies  Allergen Reactions  . Asa [Aspirin]     Hx GI bleed    Metabolic Disorder Labs: Lab Results  Component Value Date   HGBA1C 5.2 01/01/2016   Lab Results  Component Value Date   PROLACTIN 27.8 (H) 01/14/2016   Lab Results  Component Value Date   CHOL 108 01/14/2016   TRIG 36 01/14/2016   HDL 40 01/14/2016   CHOLHDL 3.1 08/28/2015   VLDL 20 01/04/2013   LDLCALC 61 01/14/2016   LDLCALC 71 08/28/2015   Lab Results  Component Value Date   TSH 0.616 01/14/2016   TSH 0.593 08/28/2015    Therapeutic Level Labs: No results found for: LITHIUM No results found for: VALPROATE No components  found for:  CBMZ  Current Medications: Current Outpatient Medications  Medication Sig Dispense Refill  . amLODipine (NORVASC) 10 MG tablet TAKE 1 TABLET BY MOUTH DAILY 30 tablet 5  . Cholecalciferol (VITAMIN D3) 1000 units CAPS Take 1 capsule (1,000 Units total) by mouth daily. 90 capsule 3  . citalopram (CELEXA) 40 MG tablet Take 1 tablet (40 mg total) by mouth daily. 30 tablet 2  . fluPHENAZine (PROLIXIN) 5 MG tablet Take 1 tablet (5 mg total) by mouth daily. 60 tablet 2  . lisinopril (ZESTRIL) 20 MG tablet Take 20 mg by mouth daily.    . risperiDONE (RISPERDAL) 1 MG tablet Take 1 tablet (1 mg total) by mouth daily. 30 tablet 2  . simvastatin (ZOCOR) 20 MG tablet TAKE 1 TABLET BY MOUTH DAILY 30  tablet 5  . tamsulosin (FLOMAX) 0.4 MG CAPS capsule TAKE 1 CAPSULE BY MOUTH DAILY AFTER SUPPER 90 capsule 3  . traZODone (DESYREL) 50 MG tablet Take 1 tablet (50 mg total) by mouth at bedtime. 30 tablet 2   No current facility-administered medications for this visit.      Psychiatric Specialty Exam: ROS  There were no vitals taken for this visit.There is no height or weight on file to calculate BMI.  General Appearance: Fairly groomed  Eye Contact:  good  Speech:  Clear and Coherent and Normal Rate  Volume:  Normal  Mood:  Euthymic, smiling inappropriately  Affect:  Congruent  Thought Process:  Disorganized and Descriptions of Associations: Intact  Orientation:  Full (Time, Place, and Person)  Thought Content: Logical, denied any hallucinations or delusions  Suicidal Thoughts:  No  Homicidal Thoughts:  No  Memory:  Recent;   Good Remote;   Good  Judgement:  Fair  Insight:  Fair  Psychomotor Activity:  Normal  Concentration:  Concentration: Good and Attention Span: Fair  Recall:  Good  Fund of Knowledge: Good  Language: Good  Akathisia:  No  Handed:  Right  AIMS (if indicated): unable to assess due to telemed visit  Assets:  Communication Skills Desire for Improvement Financial Resources/Insurance Housing Social Support  ADL's:  Intact  Cognition: WNL  Sleep:  Good   Screenings: AIMS   Flowsheet Row Office Visit from 08/19/2016 in [facility] Office Visit from 11/19/2015 in [facility]  AIMS Total Score 0 0    PHQ2-9   Flowsheet Row Office Visit from 08/28/2015 in [facility]  PHQ-2 Total Score 0       Assessment and Plan: Patient appears to be stable on his current regimen.  1. Schizophrenia, paranoid (HCC) - citalopram (CELEXA) 40 MG tablet; Take 1 tablet (40 mg total) by mouth daily.  Dispense: 30 tablet; Refill: 2 - risperiDONE (RISPERDAL) 1 MG tablet; Take 1 tablet (1 mg total) by mouth  daily.  Dispense: 30 tablet; Refill: 2 - fluPHENAZine (PROLIXIN) 5 MG tablet; Take 1 tablet (5 mg total) by mouth daily.  Dispense: 60 tablet; Refill: 2 - traZODone (DESYREL) 50 MG tablet; Take 1 tablet (50 mg total) by mouth at bedtime.  Dispense: 30 tablet; Refill: 2   Continue same medication regimen. Follow up in 3 months.    Zena Amos, MD 09/18/2020, 11:25 AM

## 2020-09-19 ENCOUNTER — Other Ambulatory Visit: Payer: Medicare HMO

## 2020-09-23 ENCOUNTER — Ambulatory Visit: Payer: Medicare HMO | Admitting: Cardiology

## 2020-09-24 ENCOUNTER — Encounter: Payer: Self-pay | Admitting: Cardiology

## 2020-10-23 ENCOUNTER — Other Ambulatory Visit: Payer: Medicare HMO

## 2020-10-29 ENCOUNTER — Other Ambulatory Visit: Payer: Medicare HMO

## 2020-10-31 ENCOUNTER — Ambulatory Visit: Payer: Medicare HMO | Admitting: Cardiology

## 2020-11-26 ENCOUNTER — Other Ambulatory Visit: Payer: Medicare HMO

## 2020-12-03 ENCOUNTER — Other Ambulatory Visit: Payer: Self-pay

## 2020-12-03 ENCOUNTER — Encounter: Payer: Self-pay | Admitting: Hospitalist

## 2020-12-03 ENCOUNTER — Observation Stay
Admission: EM | Admit: 2020-12-03 | Discharge: 2020-12-05 | Disposition: A | Discharge status: Home / Self Care | Payer: Medicare HMO | Attending: Internal Medicine | Admitting: Internal Medicine

## 2020-12-03 ENCOUNTER — Emergency Department: Payer: Medicare HMO

## 2020-12-03 DIAGNOSIS — R55 Syncope and collapse: Secondary | ICD-10-CM | POA: Diagnosis present

## 2020-12-03 DIAGNOSIS — G9340 Encephalopathy, unspecified: Secondary | ICD-10-CM | POA: Insufficient documentation

## 2020-12-03 DIAGNOSIS — Z20822 Contact with and (suspected) exposure to covid-19: Secondary | ICD-10-CM | POA: Insufficient documentation

## 2020-12-03 DIAGNOSIS — F17210 Nicotine dependence, cigarettes, uncomplicated: Secondary | ICD-10-CM | POA: Insufficient documentation

## 2020-12-03 DIAGNOSIS — Z79899 Other long term (current) drug therapy: Secondary | ICD-10-CM | POA: Insufficient documentation

## 2020-12-03 DIAGNOSIS — R739 Hyperglycemia, unspecified: Secondary | ICD-10-CM | POA: Diagnosis not present

## 2020-12-03 DIAGNOSIS — R4182 Altered mental status, unspecified: Secondary | ICD-10-CM | POA: Diagnosis not present

## 2020-12-03 DIAGNOSIS — I10 Essential (primary) hypertension: Secondary | ICD-10-CM | POA: Diagnosis not present

## 2020-12-03 DIAGNOSIS — D699 Hemorrhagic condition, unspecified: Secondary | ICD-10-CM | POA: Diagnosis not present

## 2020-12-03 DIAGNOSIS — J449 Chronic obstructive pulmonary disease, unspecified: Secondary | ICD-10-CM | POA: Diagnosis not present

## 2020-12-03 LAB — URINALYSIS, COMPLETE (UACMP) WITH MICROSCOPIC
Bacteria, UA: NONE SEEN
Bilirubin Urine: NEGATIVE
Glucose, UA: NEGATIVE mg/dL
Hgb urine dipstick: NEGATIVE
Ketones, ur: NEGATIVE mg/dL
Leukocytes,Ua: NEGATIVE
Nitrite: NEGATIVE
Protein, ur: NEGATIVE mg/dL
Specific Gravity, Urine: 1.017 (ref 1.005–1.030)
pH: 6 (ref 5.0–8.0)

## 2020-12-03 LAB — TROPONIN I (HIGH SENSITIVITY)
Troponin I (High Sensitivity): 5 ng/L (ref <18)
Troponin I (High Sensitivity): 4 ng/L (ref <18)

## 2020-12-03 LAB — BASIC METABOLIC PANEL
Anion gap: 10 (ref 5–15)
BUN: 13 mg/dL (ref 8–23)
CO2: 21 mmol/L — ABNORMAL LOW (ref 22–32)
Calcium: 8.9 mg/dL (ref 8.9–10.3)
Chloride: 107 mmol/L (ref 98–111)
Creatinine, Ser: 1.1 mg/dL (ref 0.61–1.24)
GFR, Estimated: >60 mL/min (ref >60)
Glucose, Bld: 156 mg/dL — ABNORMAL HIGH (ref 70–99)
Potassium: 3.9 mmol/L (ref 3.5–5.1)
Sodium: 138 mmol/L (ref 135–145)

## 2020-12-03 LAB — CBC
HCT: 43.5 % (ref 39.0–52.0)
Hemoglobin: 14.4 g/dL (ref 13.0–17.0)
MCH: 30.3 pg (ref 26.0–34.0)
MCHC: 33.1 g/dL (ref 30.0–36.0)
MCV: 91.6 fL (ref 80.0–100.0)
Platelets: 126 10*3/uL — ABNORMAL LOW (ref 150–400)
RBC: 4.75 MIL/uL (ref 4.22–5.81)
RDW: 14.2 % (ref 11.5–15.5)
WBC: 8.5 10*3/uL (ref 4.0–10.5)
nRBC: 0 % (ref 0.0–0.2)

## 2020-12-03 LAB — RESP PANEL BY RT-PCR (FLU A&B, COVID) ARPGX2
Influenza A by PCR: NEGATIVE
Influenza B by PCR: NEGATIVE
SARS Coronavirus 2 by RT PCR: NEGATIVE

## 2020-12-03 LAB — CBG MONITORING, ED: Glucose-Capillary: 172 mg/dL — ABNORMAL HIGH (ref 70–99)

## 2020-12-03 LAB — TSH: TSH: 0.48 u[IU]/mL (ref 0.350–4.500)

## 2020-12-03 IMAGING — CT CT HEAD W/O CM
3 series · 16 of 46 positions shown, 19 images · non-contrast
Comparison: [DATE].

CLINICAL DATA: Altered mental status.

EXAM:
CT HEAD WITHOUT CONTRAST
TECHNIQUE: Contiguous axial images were obtained from the base of the skull
through the vertex without intravenous contrast.

[Series 3: head wo · axial · 0.46mm/px · z∈[-142,-22]mm · 10 of 29 slices shown, 13 images]
[im 3/29  brain]
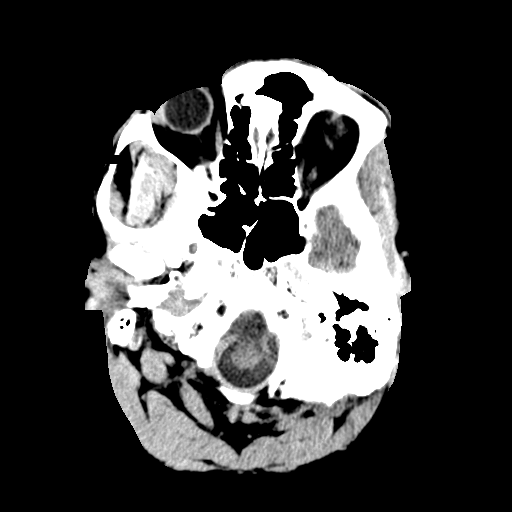
[im 3/29  bone]
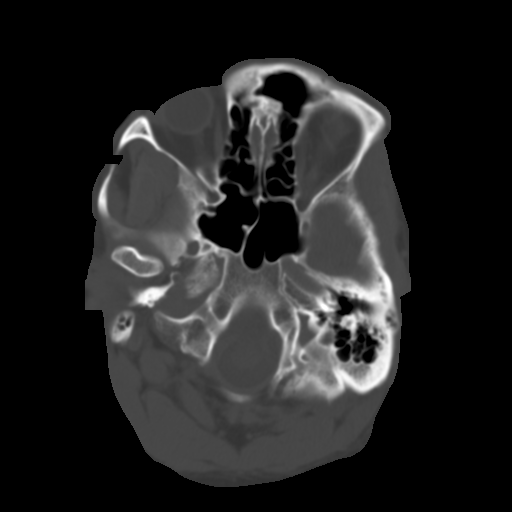
[im 6/29  brain]
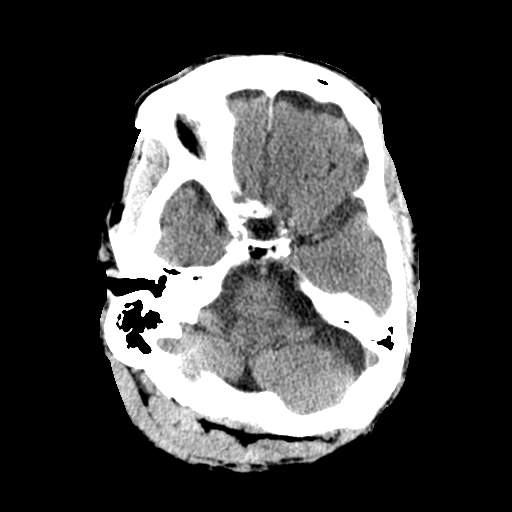
[im 8/29  brain]
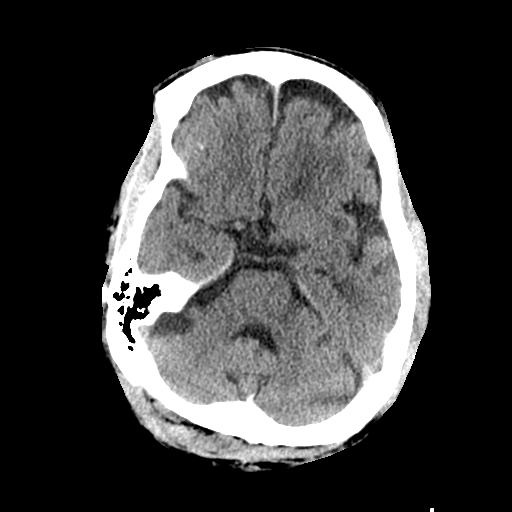
[im 11/29  brain]
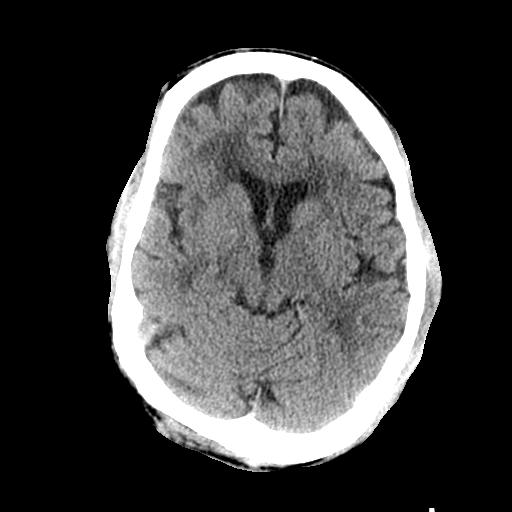
[im 14/29  brain]
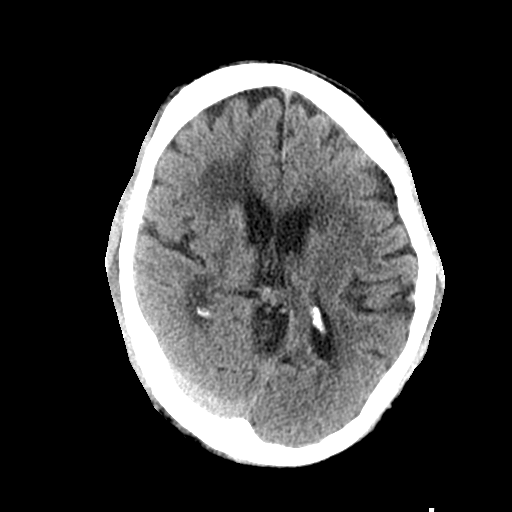
[im 14/29  bone]
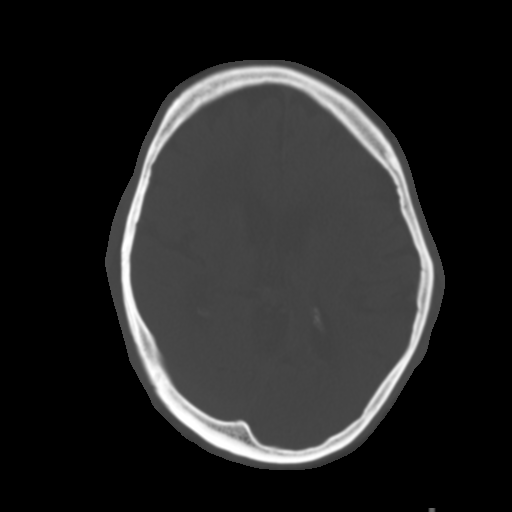
[im 16/29  brain]
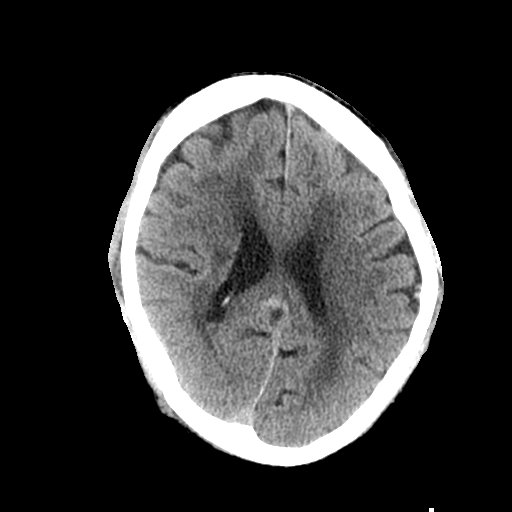
[im 19/29  brain]
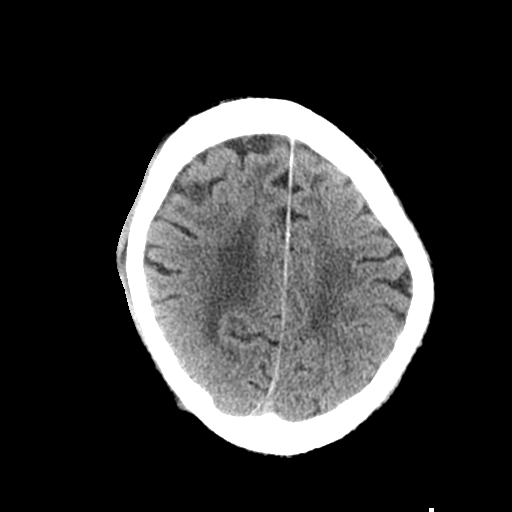
[im 22/29  brain]
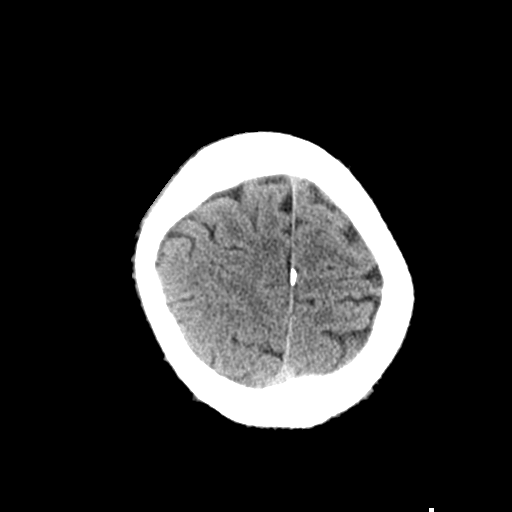
[im 24/29  brain]
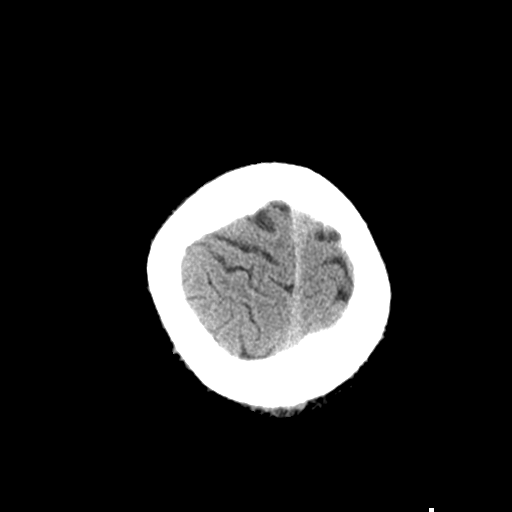
[im 24/29  bone]
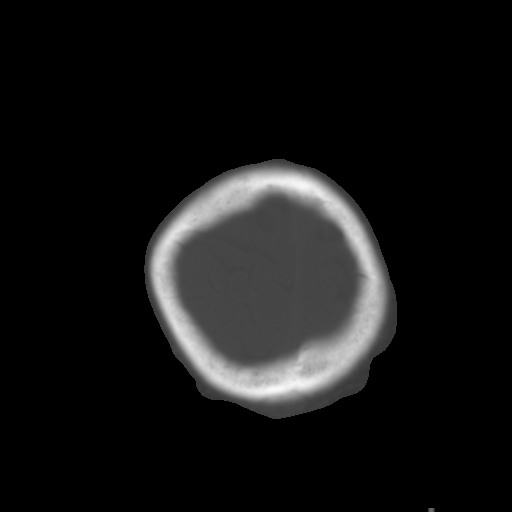
[im 27/29  brain]
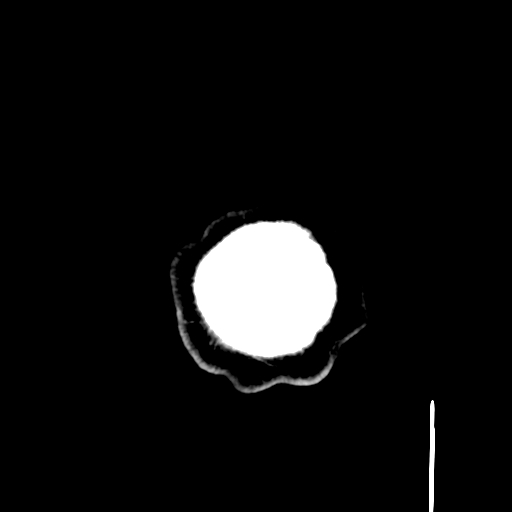

[Series 4: coronal soft tissue · coronal · 0.35mm/px · 3 of 71 slices shown]
[im 24/71  brain]
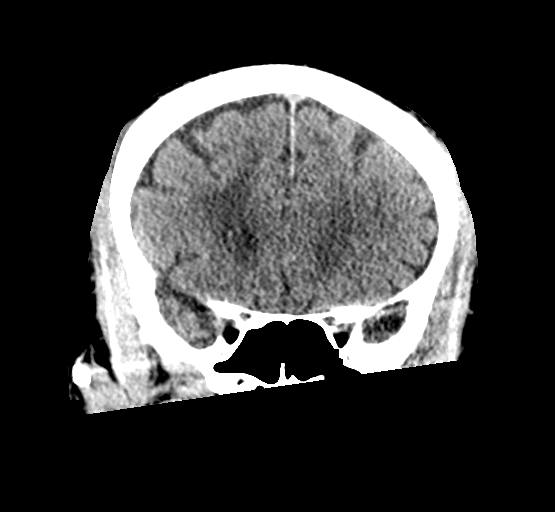
[im 32/71  brain]
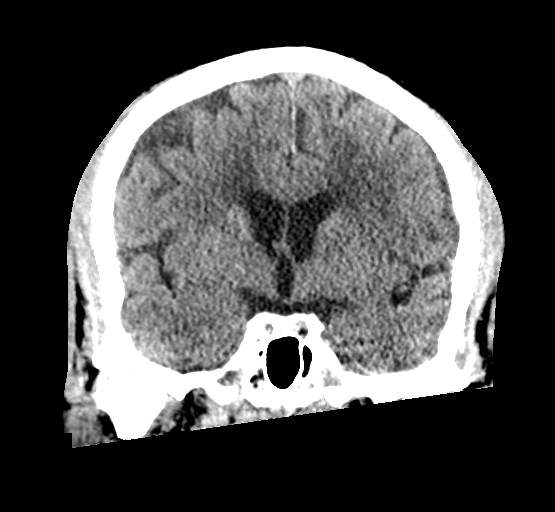
[im 39/71  brain]
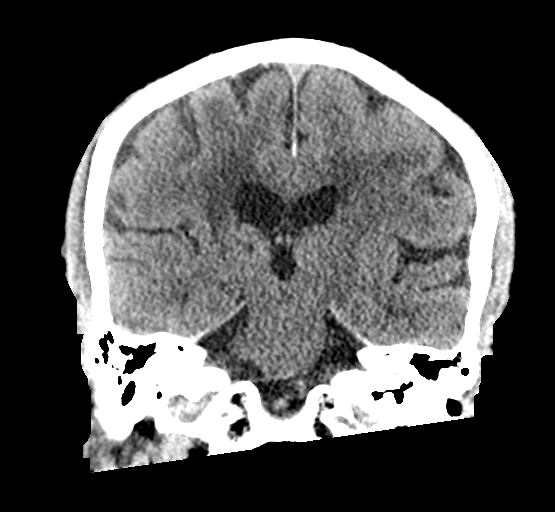

[Series 5: sagittal soft tissue · sagittal · 0.35mm/px · 3 of 59 slices shown]
[im 21/59  brain]
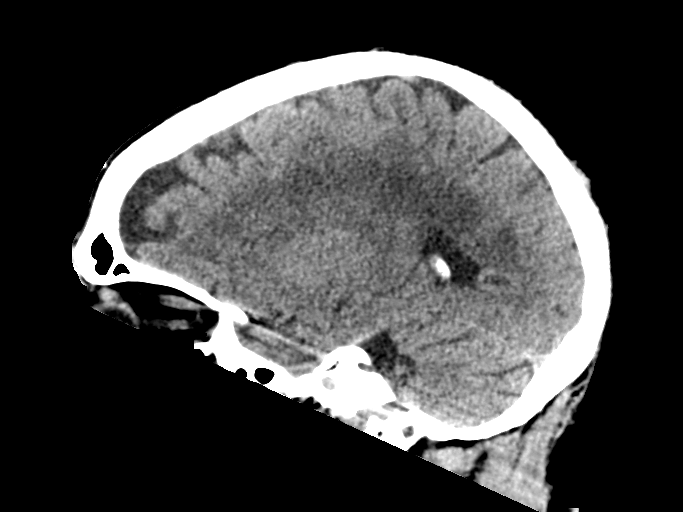
[im 30/59  brain]
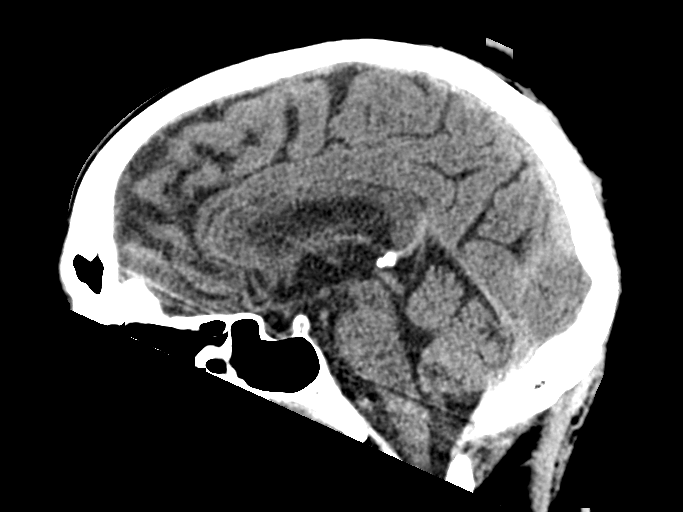
[im 38/59  brain]
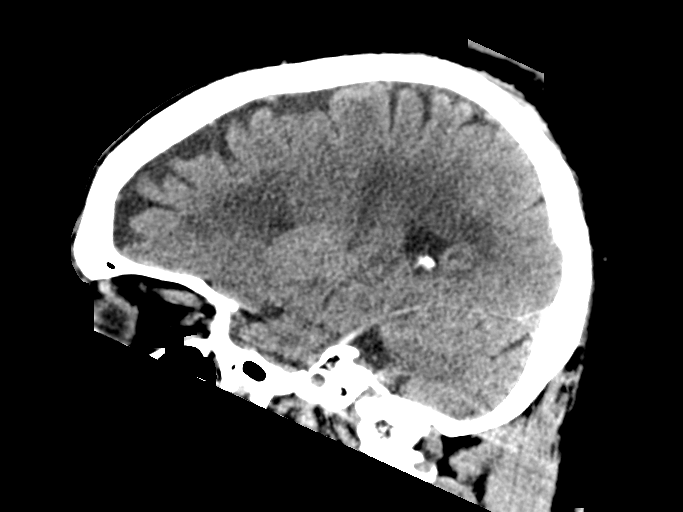

[16 of 46 positions shown; findings below may reference images not displayed]

FINDINGS: Brain: Mild chronic ischemic white matter disease is noted. No mass
effect or midline shift is noted. Ventricular size is within normal
limits. There is no evidence of mass lesion, hemorrhage or acute
infarction.

Vascular: No hyperdense vessel or unexpected calcification.

Skull: Normal. Negative for fracture or focal lesion.

Sinuses/Orbits: No acute finding.

Other: None.
IMPRESSION: No acute intracranial abnormality seen.

## 2020-12-03 MED ORDER — CALCIUM CARBONATE ANTACID 500 MG PO CHEW
1.0000 | CHEWABLE_TABLET | Freq: Three times a day (TID) | ORAL | Status: DC | PRN
  Administered 2020-12-04: 200 mg via ORAL
  Filled 2020-12-03: qty 1

## 2020-12-03 MED ORDER — POLYETHYLENE GLYCOL 3350 17 G PO PACK: 17.0000 g | PACK | Freq: Two times a day (BID) | ORAL | Status: DC | PRN

## 2020-12-03 MED ORDER — RISPERIDONE 1 MG PO TABS
1.0000 mg | ORAL_TABLET | Freq: Every day | ORAL | Status: DC
  Administered 2020-12-04 – 2020-12-05 (×2): 1 mg via ORAL
  Filled 2020-12-03 (×2): qty 1

## 2020-12-03 MED ORDER — CITALOPRAM HYDROBROMIDE 20 MG PO TABS
40.0000 mg | ORAL_TABLET | Freq: Every day | ORAL | Status: DC
  Administered 2020-12-04 – 2020-12-05 (×2): 40 mg via ORAL
  Filled 2020-12-03 (×2): qty 2

## 2020-12-03 MED ORDER — ONDANSETRON HCL 4 MG/2ML IJ SOLN: 4.0000 mg | Freq: Four times a day (QID) | INTRAMUSCULAR | Status: DC | PRN

## 2020-12-03 MED ORDER — ONDANSETRON 4 MG PO TBDP
4.0000 mg | ORAL_TABLET | Freq: Three times a day (TID) | ORAL | Status: DC | PRN
  Filled 2020-12-03: qty 1

## 2020-12-03 MED ORDER — TRAZODONE HCL 50 MG PO TABS
50.0000 mg | ORAL_TABLET | Freq: Every day | ORAL | Status: DC
  Administered 2020-12-04 (×2): 50 mg via ORAL
  Filled 2020-12-03 (×2): qty 1

## 2020-12-03 MED ORDER — ALUM & MAG HYDROXIDE-SIMETH 200-200-20 MG/5ML PO SUSP: 15.0000 mL | Freq: Four times a day (QID) | ORAL | Status: DC | PRN

## 2020-12-03 MED ORDER — SIMVASTATIN 20 MG PO TABS
20.0000 mg | ORAL_TABLET | Freq: Every day | ORAL | Status: DC
  Administered 2020-12-04 – 2020-12-05 (×2): 20 mg via ORAL
  Filled 2020-12-03 (×2): qty 1

## 2020-12-03 MED ORDER — FLUPHENAZINE HCL 5 MG PO TABS
5.0000 mg | ORAL_TABLET | Freq: Every day | ORAL | Status: DC
  Administered 2020-12-04 – 2020-12-05 (×2): 5 mg via ORAL
  Filled 2020-12-03 (×2): qty 1

## 2020-12-03 MED ORDER — ENOXAPARIN SODIUM 40 MG/0.4ML IJ SOSY
40.0000 mg | PREFILLED_SYRINGE | INTRAMUSCULAR | Status: DC
  Administered 2020-12-04 – 2020-12-05 (×2): 40 mg via SUBCUTANEOUS
  Filled 2020-12-03 (×2): qty 0.4

## 2020-12-03 MED ORDER — DOCUSATE SODIUM 100 MG PO CAPS: 100.0000 mg | ORAL_CAPSULE | Freq: Two times a day (BID) | ORAL | Status: DC | PRN

## 2020-12-03 MED ORDER — ACETAMINOPHEN 500 MG PO TABS: 1000.0000 mg | ORAL_TABLET | Freq: Three times a day (TID) | ORAL | Status: DC | PRN

## 2020-12-03 MED ORDER — TAMSULOSIN HCL 0.4 MG PO CAPS
0.4000 mg | ORAL_CAPSULE | Freq: Every day | ORAL | Status: DC
  Administered 2020-12-04: 0.4 mg via ORAL
  Filled 2020-12-03: qty 1

## 2020-12-03 NOTE — ED Notes (Signed)
ED Provider at bedside. 

## 2020-12-03 NOTE — ED Triage Notes (Signed)
Pt coming from [facility] .  Per EMS staff was helping [name] to the shower when he had a near syncopal episode , pt also had a near syncopal epispde with EMS.   Pt also complains of a right sided headache , but denies any falls or injuries   Blood sugar with ems 137

## 2020-12-03 NOTE — ED Notes (Signed)
Spoke with [facility] [phone]  They advised they assisted pt to the shower, and when they left him they heard a thumb and found the pt laying face down in the tub.

## 2020-12-03 NOTE — ED Provider Notes (Signed)
[facility] Emergency Department Provider Note ____________________________________________   Event Date/Time   First MD Initiated Contact with Patient 12/03/20 1313     (approximate)  I have reviewed the triage vital signs and the nursing notes.   HISTORY  Chief Complaint Loss of Consciousness (Two witnessed syncopal episodes)  Level 5 caveat: History of present illness limited due to possible altered mental status  HPI [name] is a 70 y.o. male with PMH as noted below including schizophrenia, hypertension, COPD, GERD, and prediabetes who presents with an apparent syncopal or near syncopal event and possible confusion.  The patient lives at a group home.  Per EMS, the patient was helped to the shower by staff.  Subsequently they left him and heard a thump.  When the staff came to the patient, he was awake and had fallen.  He then had a near syncopal event with EMS.  The patient remembers that he had a dizzy spell but otherwise cannot give much history.  He has a mild headache but denies any other acute pain.  Past Medical History:  Diagnosis Date  . Bipolar disorder (HCC)   . Depression   . Hyperlipidemia   . Hypertension   . Schizophrenia [facility])     Patient Active Problem List   Diagnosis Date Noted  . Syncope 12/03/2020  . Vitamin D deficiency 08/29/2015  . Prediabetes 08/29/2015  . Hypertension 08/28/2015  . GERD (gastroesophageal reflux disease) 08/28/2015  . Hyperlipidemia 08/28/2015  . BPH (benign prostatic hyperplasia) 08/28/2015  . Hx of sleep apnea 08/28/2015  . COPD (chronic obstructive pulmonary disease) (HCC) 08/28/2015  . Smoker 08/28/2015  . Hx of lower gastrointestinal bleeding 08/28/2015  . Hx of gastritis 08/28/2015  . Bipolar 1 disorder (HCC) 08/28/2015  . Schizophrenia, paranoid (HCC) 08/28/2015    No past surgical history on file.  Prior to Admission medications   Medication Sig Start Date End Date Taking?  Authorizing Provider  amLODipine (NORVASC) 10 MG tablet TAKE 1 TABLET BY MOUTH DAILY 05/31/17   Plonk, Chrissie Noa, MD  Cholecalciferol (VITAMIN D3) 1000 units CAPS Take 1 capsule (1,000 Units total) by mouth daily. 08/05/17   Plonk, Chrissie Noa, MD  citalopram (CELEXA) 40 MG tablet Take 1 tablet (40 mg total) by mouth daily. 09/18/20   Zena Amos, MD  fluPHENAZine (PROLIXIN) 5 MG tablet Take 1 tablet (5 mg total) by mouth daily. 09/18/20   Zena Amos, MD  lisinopril (ZESTRIL) 20 MG tablet Take 20 mg by mouth daily. 07/15/19   [provider]  risperiDONE (RISPERDAL) 1 MG tablet Take 1 tablet (1 mg total) by mouth daily. 09/18/20   Zena Amos, MD  simvastatin (ZOCOR) 20 MG tablet TAKE 1 TABLET BY MOUTH DAILY 05/31/17   Plonk, Chrissie Noa, MD  tamsulosin (FLOMAX) 0.4 MG CAPS capsule TAKE 1 CAPSULE BY MOUTH DAILY AFTER SUPPER 12/11/16   Plonk, Chrissie Noa, MD  traZODone (DESYREL) 50 MG tablet Take 1 tablet (50 mg total) by mouth at bedtime. 09/18/20   Zena Amos, MD    Allergies Jonne Ply [aspirin]  Family History  Family history unknown: Yes    Social History Social History   Tobacco Use  . Smoking status: Current Every Day Smoker    Packs/day: 1.00    Types: Cigarettes  . Smokeless tobacco: Never Used  Vaping Use  . Vaping Use: Never used  Substance Use Topics  . Alcohol use: No    Alcohol/week: 0.0 standard drinks  . Drug use: No    Review  of Systems Level 5 caveat: Review of systems limited due to possible altered mental status  Cardiovascular: Denies chest pain. Respiratory: Denies shortness of breath. Gastrointestinal: No vomiting. Musculoskeletal: Negative for back pain. Neurological: Positive for headache.   ____________________________________________   PHYSICAL EXAM:  VITAL SIGNS: ED Triage Vitals [12/03/20 1258]  Enc Vitals Group     BP 131/65     Pulse Rate 78     Resp 16     Temp 98 F (36.7 C)     Temp Source Oral     SpO2 100 %     Weight 197 lb 15.6 oz  (89.8 kg)     Height 5\' 9"  (1.753 m)     Head Circumference      Peak Flow      Pain Score 0     Pain Loc      Pain Edu?      Excl. in GC?     Constitutional: Alert, slightly confused (knows his name and that he had a dizzy spell but does not know where he is or what year it is).  Comfortable appearing and in no acute distress. Eyes: Conjunctivae are normal.  EOMI.  PERRLA. Head: Atraumatic. Nose: No congestion/rhinnorhea. Mouth/Throat: Mucous membranes are moist.   Neck: Normal range of motion.  Cardiovascular: Normal rate, regular rhythm. Grossly normal heart sounds.  Good peripheral circulation. Respiratory: Normal respiratory effort.  No retractions. Lungs CTAB. Gastrointestinal: Soft and nontender. No distention.  Genitourinary: No flank tenderness. Musculoskeletal: No lower extremity edema.  Extremities warm and well perfused.  Neurologic:  Normal speech and language.  Motor intact in all extremities.  No pronator drift.  Normal coordination with no ataxia. Skin:  Skin is warm and dry. No rash noted. Psychiatric: Calm and cooperative.  Slightly delayed responses to questions.  ____________________________________________   LABS (all labs ordered are listed, but only abnormal results are displayed)  Labs Reviewed  BASIC METABOLIC PANEL - Abnormal; Notable for the following components:      Result Value   CO2 21 (*)    Glucose, Bld 156 (*)    All other components within normal limits  CBC - Abnormal; Notable for the following components:   Platelets 126 (*)    All other components within normal limits  CBG MONITORING, ED - Abnormal; Notable for the following components:   Glucose-Capillary 172 (*)    All other components within normal limits  RESP PANEL BY RT-PCR (FLU A&B, COVID) ARPGX2  RESP PANEL BY RT-PCR (FLU A&B, COVID) ARPGX2  URINALYSIS, COMPLETE (UACMP) WITH MICROSCOPIC  TROPONIN I (HIGH SENSITIVITY)  TROPONIN I (HIGH SENSITIVITY)    ____________________________________________  EKG  ED ECG REPORT I, , the attending physician, personally viewed and interpreted this ECG.  Date: 12/03/2020 EKG Time: 1252 Rate: 73 Rhythm: normal sinus rhythm QRS Axis: Left axis Intervals: normal ST/T Wave abnormalities: normal Narrative Interpretation: no evidence of acute ischemia  ____________________________________________  RADIOLOGY  CT head: No ICH or other acute abnormality  ____________________________________________   PROCEDURES  Procedure(s) performed: No  Procedures  Critical Care performed: No ____________________________________________   INITIAL IMPRESSION / ASSESSMENT AND PLAN / ED COURSE  Pertinent labs & imaging results that were available during my care of the patient were reviewed by me and considered in my medical decision making (see chart for details).  70 year old male with PMH as noted above including schizophrenia, hypertension, COPD, prediabetes, and GERD presents with an apparent syncopal or near syncopal event with fall.  He is also possibly somewhat confused.  I reviewed the past medical records in Epic.  The patient has no recent prior ED visits or admissions.  He was last seen by psychiatry in a televisit on 3/2 and at that time appeared to be able to engage in a conversation and give history.  On exam currently, the patient is alert but appears slightly confused; he knows he had a dizzy spell and is able to confirm or deny symptoms, but does not know that he is in the hospital or what year or month it is.  Vital signs are normal.  Neurologic exam is nonfocal.  The physical exam is otherwise unremarkable.  Differential is broad but includes dehydration, AKI or other metabolic abnormality, UTI or other infection, medication side effect, cardiac cause, or less likely primary CNS cause.  We will obtain a CT head, basic labs, cardiac enzymes, urinalysis, and  reassess.  ----------------------------------------- 3:24 PM on 12/03/2020 -----------------------------------------  CT head shows no acute abnormality.  Lab work-up so far is reassuring although the urinalysis is pending.  I called the group home administrator [facility] who confirmed that the patient seemed altered today and at baseline he normally knows his birthdate and where he is.  Given the altered mental status and syncope, we will admit for further observation and work-up.  I consulted Dr. Fran Lowes from the hospitalist service for admission. ____________________________________________   FINAL CLINICAL IMPRESSION(S) / ED DIAGNOSES  Final diagnoses:  Near syncope  Altered mental status, unspecified altered mental status type      NEW MEDICATIONS STARTED DURING THIS VISIT:  New Prescriptions   No medications on file     Note:  This document was prepared using Dragon voice recognition software and may include unintentional dictation errors.   Dionne Bucy, MD 12/03/20 1525

## 2020-12-03 NOTE — ED Notes (Signed)
Patient transported to CT 

## 2020-12-03 NOTE — H&P (Signed)
History and Physical    [name] XBD:[id number] DOB: 1950/04/07 DOA: 12/03/2020  PCP: Pcp, No  Patient coming from: group home  I have personally briefly reviewed patient's old medical records in [facility]  Chief Complaint: near syncope  HPI: [name] is a 70 y.o. male with medical history significant of bipolar, Schizophrenia, HTN who presented from group home with near syncope.  Per facility stuff, pt had a near syncopal event while taking a shower.  Later, had another episode with EMS.  Pt said he felt dizzy before these episodes.  Pt denied dyspnea, chest pain, abdominal pain, N/V/D, dysuria.  Pt reported normal oral intake and that facility staff gives him his meds.  Pt said he can walk and gets out to the patio, but doesn't leave the facility.  Pt said that before today, he had had 1 prior episode of syncope in the past.  ED Course: afebrile, vitals all wnl.  CBC and BMP unremarkable except for BG 156.  Trop neg x2.  EKG normal sinus with no ACS-related changes.  COVID neg, UA neg for infection, CT head no acute finding.  Pt admitted for observation.  Assessment/Plan Active Problems:   Syncope  # Near syncope --unclear etiology.  Both reported episodes were 2nd hand report and not detailed.  Pt can answer simple yes/no questions, but not able to describe events in details.   --basic workup in the ED unremarkable. Plan: --monitor on tele --Echo  # Schizophrenia, paranoid  --follows regularly with psych Dr. Evelene Croon --on home Celexa, risperiDONE, fluPHENAZine, and trazodone --cont home regimen  # HTN --Hold home amlodipine and Lisinopril for now  # Hyperglycemia --BG mildly elevated on presentation --check A1c   DVT prophylaxis: Lovenox SQ Code Status: Full code  Family Communication:   Disposition Plan: back to group home  Consults called: none Level of care: Med-Surg   Review of Systems: As per HPI otherwise complete review of systems negative.    Past Medical History:  Diagnosis Date  . Bipolar disorder (HCC)   . Depression   . Hyperlipidemia   . Hypertension   . Schizophrenia (HCC)     No past surgical history on file.   reports that he has been smoking cigarettes. He has been smoking about 1.00 pack per day. He has never used smokeless tobacco. He reports that he does not drink alcohol and does not use drugs.  Allergies  Allergen Reactions  . Asa [Aspirin]     Hx GI bleed    Family History  Family history unknown: Yes     Prior to Admission medications   Medication Sig Start Date End Date Taking? Authorizing Provider  amLODipine (NORVASC) 10 MG tablet TAKE 1 TABLET BY MOUTH DAILY 05/31/17  Yes Plonk, Chrissie Noa, MD  Cholecalciferol (VITAMIN D3) 1000 units CAPS Take 1 capsule (1,000 Units total) by mouth daily. 08/05/17  Yes Plonk, Chrissie Noa, MD  citalopram (CELEXA) 40 MG tablet Take 1 tablet (40 mg total) by mouth daily. 09/18/20   Zena Amos, MD  fluPHENAZine (PROLIXIN) 5 MG tablet Take 1 tablet (5 mg total) by mouth daily. 09/18/20   Zena Amos, MD  lisinopril (ZESTRIL) 20 MG tablet Take 20 mg by mouth daily. 07/15/19   [provider]  risperiDONE (RISPERDAL) 1 MG tablet Take 1 tablet (1 mg total) by mouth daily. 09/18/20   Zena Amos, MD  simvastatin (ZOCOR) 20 MG tablet TAKE 1 TABLET BY MOUTH DAILY 05/31/17   Schuyler Amor, MD  tamsulosin (  FLOMAX) 0.4 MG CAPS capsule TAKE 1 CAPSULE BY MOUTH DAILY AFTER SUPPER 12/11/16   Plonk, Chrissie Noa, MD  traZODone (DESYREL) 50 MG tablet Take 1 tablet (50 mg total) by mouth at bedtime. 09/18/20   Zena Amos, MD    Physical Exam: Vitals:   12/03/20 1258 12/03/20 1910  BP: 131/65 130/80  Pulse: 78 80  Resp: 16 16  Temp: 98 F (36.7 C) 98 F (36.7 C)  TempSrc: Oral Oral  SpO2: 100% 100%  Weight: 89.8 kg   Height: 5\' 9"  (1.753 m)     Constitutional: NAD, alert, oriented to self only HEENT: conjunctivae and lids normal, EOMI CV: No cyanosis.   RESP: normal  respiratory effort, on RA Extremities: No effusions, edema in BLE SKIN: warm, dry Neuro: II - XII grossly intact.   Psych: flat mood and affect.     Labs on Admission: I have personally reviewed following labs and imaging studies  CBC: Recent Labs  Lab 12/03/20 1240  WBC 8.5  HGB 14.4  HCT 43.5  MCV 91.6  PLT 126*   Basic Metabolic Panel: Recent Labs  Lab 12/03/20 1240  NA 138  K 3.9  CL 107  CO2 21*  GLUCOSE 156*  BUN 13  CREATININE 1.10  CALCIUM 8.9   GFR: Estimated Creatinine Clearance: 69.2 mL/min (by C-G formula based on SCr of 1.1 mg/dL). Liver Function Tests: No results for input(s): AST, ALT, ALKPHOS, BILITOT, PROT, ALBUMIN in the last 168 hours. No results for input(s): LIPASE, AMYLASE in the last 168 hours. No results for input(s): AMMONIA in the last 168 hours. Coagulation Profile: No results for input(s): INR, PROTIME in the last 168 hours. Cardiac Enzymes: No results for input(s): CKTOTAL, CKMB, CKMBINDEX, TROPONINI in the last 168 hours. BNP (last 3 results) No results for input(s): PROBNP in the last 8760 hours. HbA1C: No results for input(s): HGBA1C in the last 72 hours. CBG: Recent Labs  Lab 12/03/20 1314  GLUCAP 172*   Lipid Profile: No results for input(s): CHOL, HDL, LDLCALC, TRIG, CHOLHDL, LDLDIRECT in the last 72 hours. Thyroid Function Tests: No results for input(s): TSH, T4TOTAL, FREET4, T3FREE, THYROIDAB in the last 72 hours. Anemia Panel: No results for input(s): VITAMINB12, FOLATE, FERRITIN, TIBC, IRON, RETICCTPCT in the last 72 hours. Urine analysis:    Component Value Date/Time   COLORURINE YELLOW (A) 12/03/2020 1556   APPEARANCEUR CLEAR (A) 12/03/2020 1556   APPEARANCEUR Clear 01/03/2013 0232   LABSPEC 1.017 12/03/2020 1556   LABSPEC 1.006 01/03/2013 0232   PHURINE 6.0 12/03/2020 1556   GLUCOSEU NEGATIVE 12/03/2020 1556   GLUCOSEU Negative 01/03/2013 0232   HGBUR NEGATIVE 12/03/2020 1556   BILIRUBINUR NEGATIVE  12/03/2020 1556   BILIRUBINUR Negative 01/03/2013 0232   KETONESUR NEGATIVE 12/03/2020 1556   PROTEINUR NEGATIVE 12/03/2020 1556   NITRITE NEGATIVE 12/03/2020 1556   LEUKOCYTESUR NEGATIVE 12/03/2020 1556   LEUKOCYTESUR Negative 01/03/2013 0232    Radiological Exams on Admission: CT Head Wo Contrast  Result Date: 12/03/2020 CLINICAL DATA:  Altered mental status. EXAM: CT HEAD WITHOUT CONTRAST TECHNIQUE: Contiguous axial images were obtained from the base of the skull through the vertex without intravenous contrast. COMPARISON:  January 03, 2013. FINDINGS: Brain: Mild chronic ischemic white matter disease is noted. No mass effect or midline shift is noted. Ventricular size is within normal limits. There is no evidence of mass lesion, hemorrhage or acute infarction. Vascular: No hyperdense vessel or unexpected calcification. Skull: Normal. Negative for fracture or focal lesion. Sinuses/Orbits: No acute  finding. Other: None. IMPRESSION: No acute intracranial abnormality seen. Electronically Signed   By: Lupita Raider M.D.   On: 12/03/2020 14:20      Darlin Priestly MD [facility]  If 7PM-7AM, please contact night-coverage 12/03/2020, 7:29 PM

## 2020-12-04 ENCOUNTER — Observation Stay (HOSPITAL_BASED_OUTPATIENT_CLINIC_OR_DEPARTMENT_OTHER)
Admit: 2020-12-04 | Discharge: 2020-12-04 | Disposition: A | Discharge status: Home / Self Care | Payer: Medicare HMO | Attending: Hospitalist | Admitting: Hospitalist

## 2020-12-04 ENCOUNTER — Observation Stay: Payer: Medicare HMO

## 2020-12-04 DIAGNOSIS — R55 Syncope and collapse: Secondary | ICD-10-CM

## 2020-12-04 DIAGNOSIS — I361 Nonrheumatic tricuspid (valve) insufficiency: Secondary | ICD-10-CM | POA: Diagnosis not present

## 2020-12-04 LAB — ECHOCARDIOGRAM COMPLETE
AR max vel: 1.04 cm2
AV Area VTI: 0.95 cm2
AV Area mean vel: 0.9 cm2
AV Mean grad: 12.7 mmHg
AV Peak grad: 21 mmHg
Ao pk vel: 2.29 m/s
Area-P 1/2: 3.17 cm2
Height: 69 in
S' Lateral: 2.51 cm
Weight: 3167.57 oz

## 2020-12-04 LAB — BASIC METABOLIC PANEL
Anion gap: 6 (ref 5–15)
BUN: 11 mg/dL (ref 8–23)
CO2: 25 mmol/L (ref 22–32)
Calcium: 9.3 mg/dL (ref 8.9–10.3)
Chloride: 106 mmol/L (ref 98–111)
Creatinine, Ser: 0.84 mg/dL (ref 0.61–1.24)
GFR, Estimated: >60 mL/min (ref >60)
Glucose, Bld: 128 mg/dL — ABNORMAL HIGH (ref 70–99)
Potassium: 3.7 mmol/L (ref 3.5–5.1)
Sodium: 137 mmol/L (ref 135–145)

## 2020-12-04 LAB — VITAMIN B12: Vitamin B-12: 289 pg/mL (ref 180–914)

## 2020-12-04 LAB — HIV ANTIBODY (ROUTINE TESTING W REFLEX): HIV Screen 4th Generation wRfx: NONREACTIVE

## 2020-12-04 LAB — MAGNESIUM: Magnesium: 2 mg/dL (ref 1.7–2.4)

## 2020-12-04 LAB — AMMONIA: Ammonia: 16 umol/L (ref 9–35)

## 2020-12-04 LAB — CBC
HCT: 41.8 % (ref 39.0–52.0)
Hemoglobin: 13.9 g/dL (ref 13.0–17.0)
MCH: 30.4 pg (ref 26.0–34.0)
MCHC: 33.3 g/dL (ref 30.0–36.0)
MCV: 91.5 fL (ref 80.0–100.0)
Platelets: 122 10*3/uL — ABNORMAL LOW (ref 150–400)
RBC: 4.57 MIL/uL (ref 4.22–5.81)
RDW: 14.2 % (ref 11.5–15.5)
WBC: 7.6 10*3/uL (ref 4.0–10.5)
nRBC: 0 % (ref 0.0–0.2)

## 2020-12-04 LAB — HEMOGLOBIN A1C
Hgb A1c MFr Bld: 5.7 % — ABNORMAL HIGH (ref 4.8–5.6)
Mean Plasma Glucose: 116.89 mg/dL

## 2020-12-04 IMAGING — MR MR HEAD W/O CM
9 of 10 series · 41 of 48 positions shown · non-contrast
Comparison: CT head [DATE].

CLINICAL DATA: Mental status change.

EXAM:
MRI HEAD WITHOUT CONTRAST
TECHNIQUE: Multiplanar, multiecho pulse sequences of the brain and surrounding
structures were obtained without intravenous contrast.

[Series 6: ax dwi_tracew · axial · 3.0mm · 0.65mm/px · z∈[-98,+55]mm · 5 of 48 slices shown]
[im 1/48]
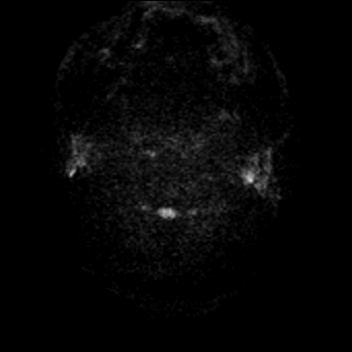
[im 12/48]
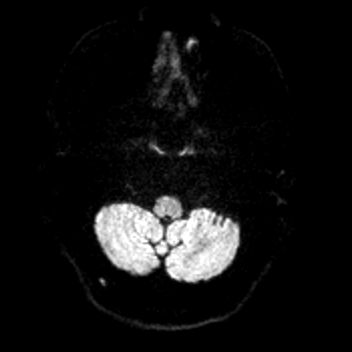
[im 24/48]
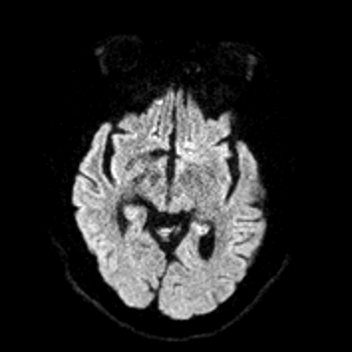
[im 36/48]
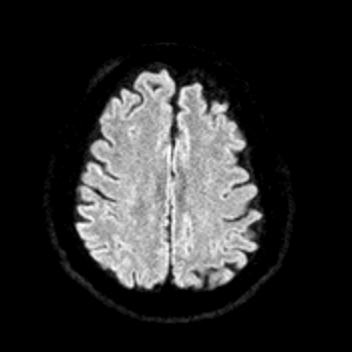
[im 48/48]
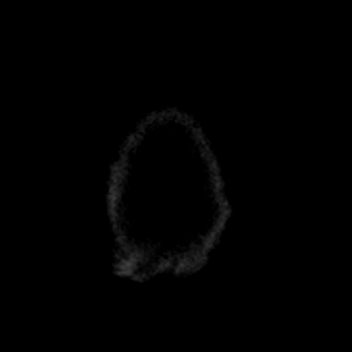

[Series 7: ax dwi_adc · axial · 3.0mm · 0.65mm/px · z∈[-98,+55]mm · 6 of 48 slices shown]
[im 1/48]
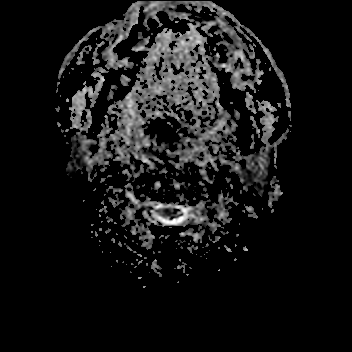
[im 10/48]
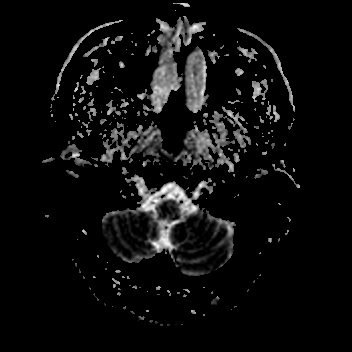
[im 19/48]
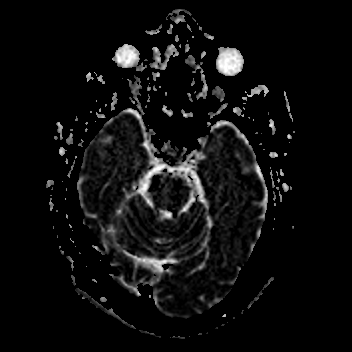
[im 29/48]
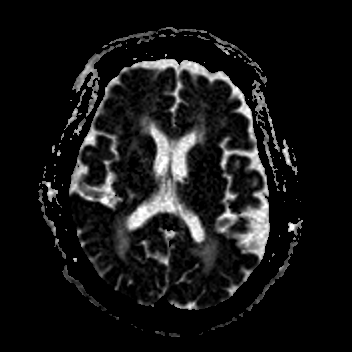
[im 38/48]
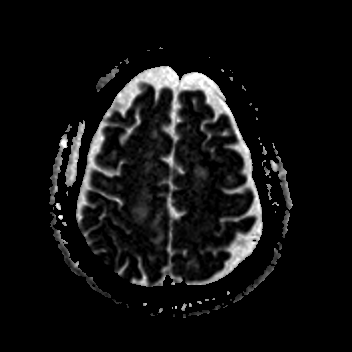
[im 48/48]
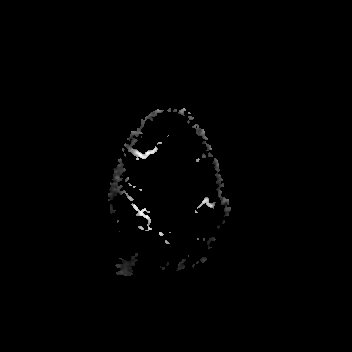

[Series 8: FLAIR · axial · 3.0mm · 0.53mm/px · z∈[-100,+59]mm · 7 of 55 slices shown (1 of 2)]
[im 1/55]
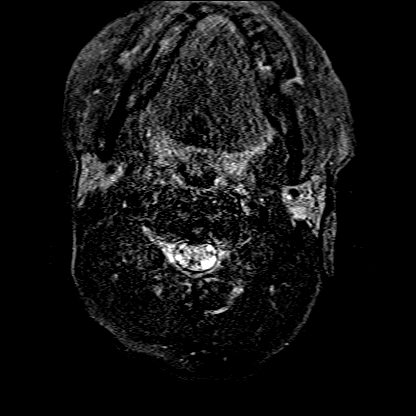
[im 10/55]
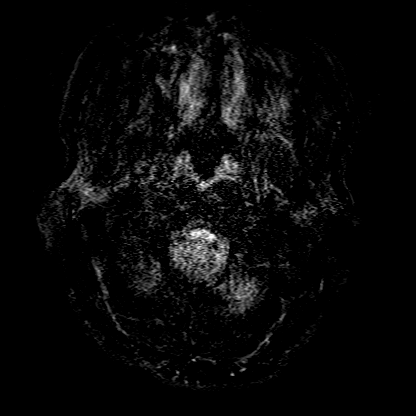
[im 19/55]
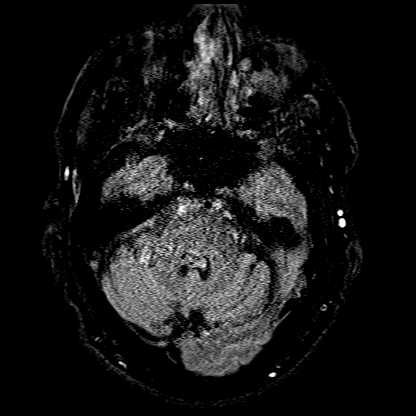
[im 28/55]
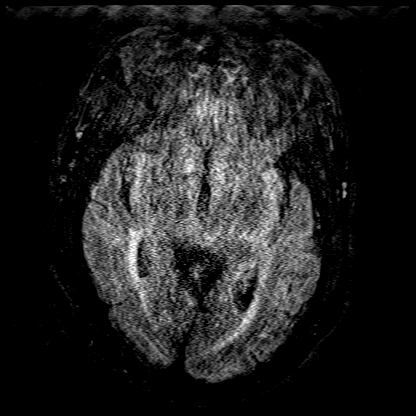
[im 37/55]
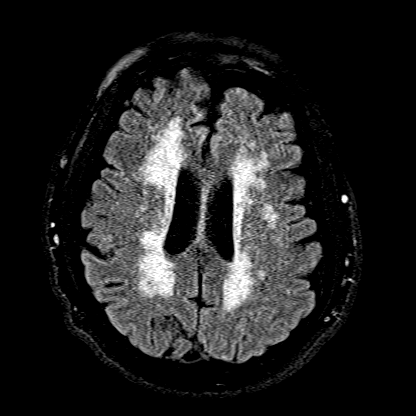
[im 46/55]
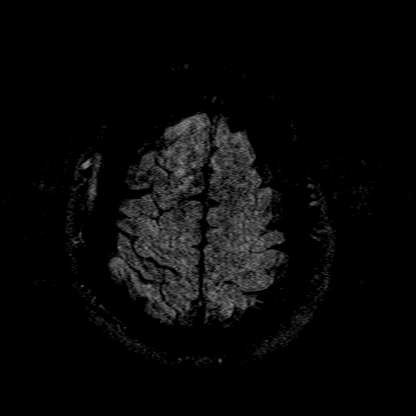
[im 55/55]
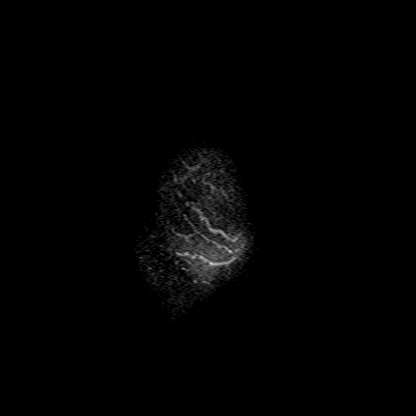

[Series 9: cor dwi_tracew · coronal · 5.0mm · 1.31mm/px · 5 of 38 slices shown]
[im 1/38]
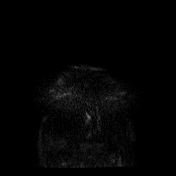
[im 10/38]
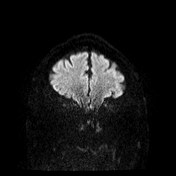
[im 19/38]
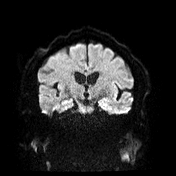
[im 28/38]
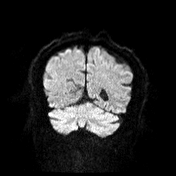
[im 38/38]
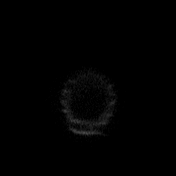

[Series 10: cor dwi_adc · coronal · 5.0mm · 1.31mm/px · 2 of 38 slices shown]
[im 1/38]
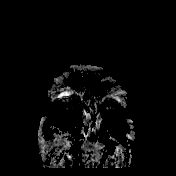
[im 10/38]
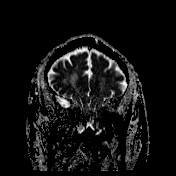

[Series 11: T2 · axial · 5.0mm · 0.45mm/px · z∈[-98,+55]mm · 4 of 27 slices shown (1 of 2)]
[im 1/27]
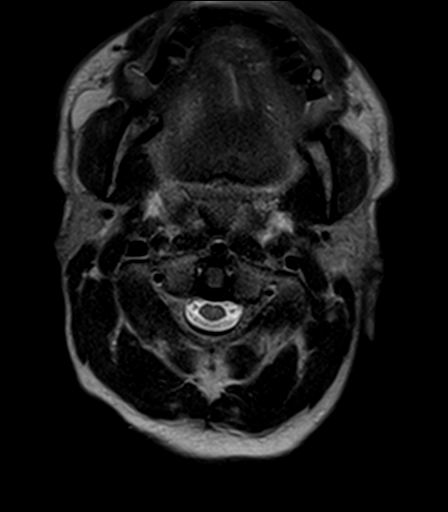
[im 9/27]
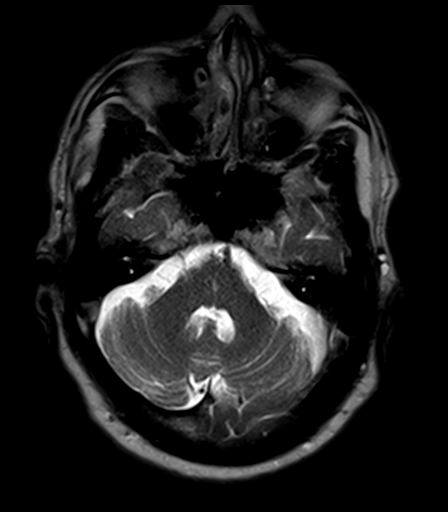
[im 18/27]
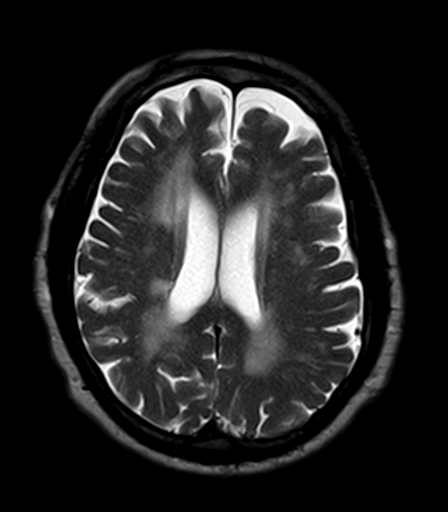
[im 27/27]
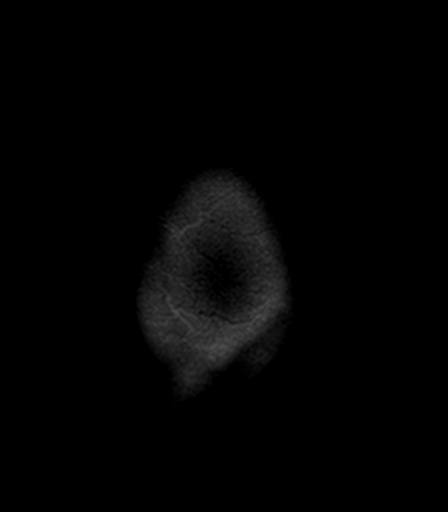

[Series 13: T1 · axial · 5.0mm · 0.90mm/px · z∈[-98,+55]mm · 4 of 27 slices shown]
[im 1/27]
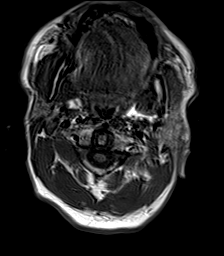
[im 9/27]
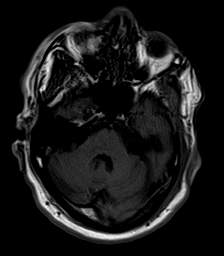
[im 18/27]
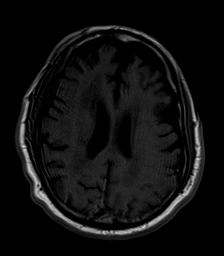
[im 27/27]
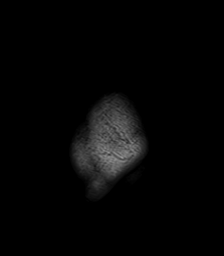

[Series 14: T2 · coronal · 5.0mm · 0.45mm/px · 4 of 31 slices shown (2 of 2)]
[im 1/31]
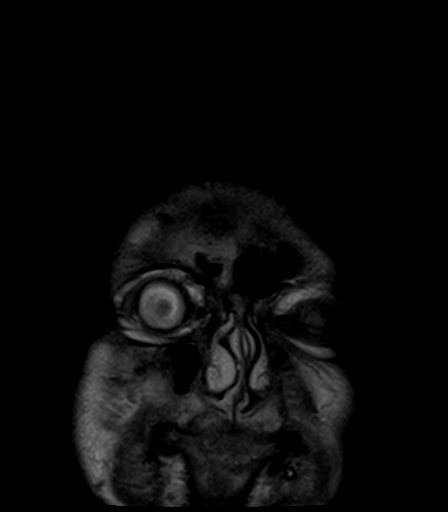
[im 11/31]
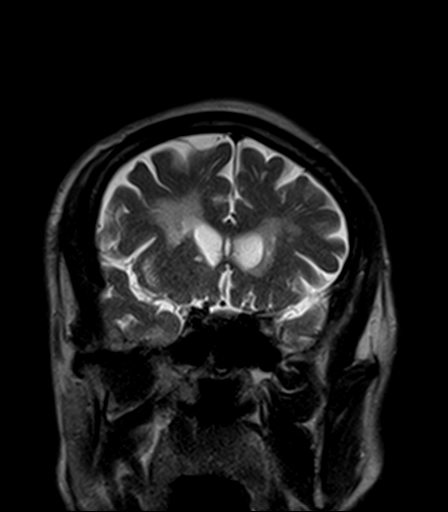
[im 21/31]
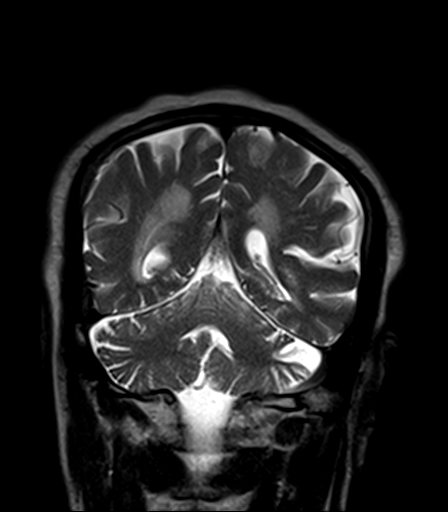
[im 31/31]
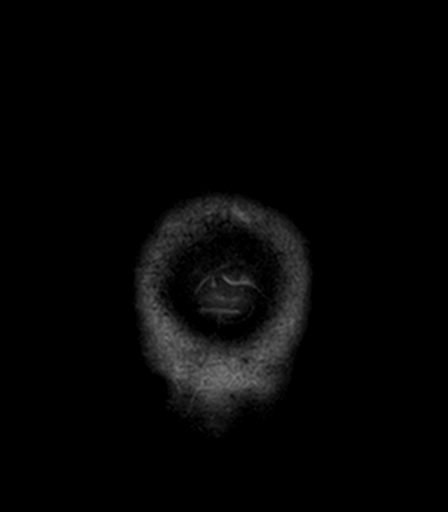

[Series 15: FLAIR · axial · 5.0mm · 1.20mm/px · z∈[-98,+55]mm · 4 of 27 slices shown (2 of 2)]
[im 1/27]
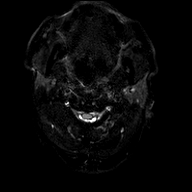
[im 9/27]
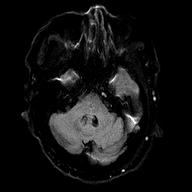
[im 18/27]
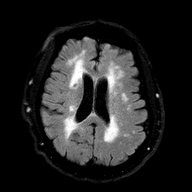
[im 27/27]
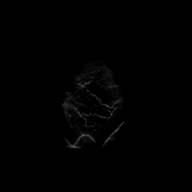

[41 of 48 positions shown; findings below may reference images not displayed]

FINDINGS: Motion limited exam.  Within this limitation:

Brain: No acute infarct. Small remote infarct in the right corona
radiata. Advanced, confluent and patchy T2/FLAIR hyperintensity
within the white matter, nonspecific but most likely related to
chronic microvascular ischemic disease. No acute hemorrhage. No
hydrocephalus. No extra-axial fluid collection. No mass lesion or
abnormal mass effect.

Vascular: Major arterial flow voids are maintained at the skull
base.

Skull and upper cervical spine: Normal marrow signal.

Sinuses/Orbits: Clear sinuses.  Unremarkable orbits.

Other: No mastoid effusions.
IMPRESSION: 1. No evidence of acute intracranial abnormality on this motion
limited exam.
2. Advanced chronic microvascular ischemic disease with remote
infarct in the right corona radiata.

## 2020-12-04 MED ORDER — CYANOCOBALAMIN 1000 MCG/ML IJ SOLN
1000.0000 ug | Freq: Every day | INTRAMUSCULAR | Status: DC
  Administered 2020-12-04 – 2020-12-05 (×2): 1000 ug via INTRAMUSCULAR
  Filled 2020-12-04 (×2): qty 1

## 2020-12-04 MED ORDER — SODIUM CHLORIDE 0.9 % IV SOLN: INTRAVENOUS | Status: DC

## 2020-12-04 MED ORDER — CYANOCOBALAMIN 1000 MCG/ML IJ SOLN: 1000.0000 ug | Freq: Once | INTRAMUSCULAR | Status: DC

## 2020-12-04 MED ORDER — NICOTINE 14 MG/24HR TD PT24
14.0000 mg | MEDICATED_PATCH | Freq: Every day | TRANSDERMAL | Status: DC
  Administered 2020-12-04 – 2020-12-05 (×2): 14 mg via TRANSDERMAL
  Filled 2020-12-04 (×2): qty 1

## 2020-12-04 NOTE — Plan of Care (Signed)

## 2020-12-04 NOTE — Plan of Care (Signed)

## 2020-12-04 NOTE — Progress Notes (Addendum)
PROGRESS NOTE    [name]  QBH:[id number] DOB: 1950-06-14 DOA: 12/03/2020 PCP: Pcp, No   Brief Narrative: 70 year old with past medical history significant for bipolar, schizophrenia, hypertension who presented from group home with near syncope.  Per facility staff, patient had a near syncopal event while taking a shower.  Later that day he had another episode and EMS was called.  Patient reported feeling dizzy.  He denies chest pain dyspnea nausea vomiting.  Patient is noted to be disoriented today, not at his baseline   Assessment & Plan:   Active Problems:   Syncope  1-Near syncope/Dizziness:  Orthostatic vitals positive. Plan to continue with fluids overnight. B12 normal low range.  Plan to start B12 supplement MRI negative for acute stroke PT evaluation.  Monitor on telemetry  Check ECHO  2-Schizophrenia:  Continue with Celexa, Risperidone,Fluphenazine and trazodone.   3-HTN;  Continue to hold amlodipine and lisinopril.  Monitor orthostatic Vitals.   4-Hyperglycemia:  HB-A1c: 5.7, pre diabetes. Diet modifications.   Mild thrombocytopenia; Monitor.  Confusion: MRI negative for Stroke, treat for dehydration.  Check Ammonia level.     Estimated body mass index is 29.24 kg/m as calculated from the following:   Height as of this encounter: 5\' 9"  (1.753 m).   Weight as of this encounter: 89.8 kg.   DVT prophylaxis: Lovenox Code Status: Full Code Family Communication: care discussed with patient.  Disposition Plan:  Status is: Observation  The patient remains OBS appropriate and will d/c before 2 midnights.  Dispo: The patient is from: Group home              Anticipated d/c is to: Group home              Patient currently is not medically stable to d/c. Plan to continue with IV fluids    Difficult to place patient No    Consultants:   None  Procedures:   ECHO  Antimicrobials:    Subjective: Patient speech is difficult to understand, he  is missing multiples dentures. He reported dizziness on admission, report improvement of dizziness. He was disoriented, per caregiver he is oriented at baseline.   Objective: Vitals:   12/04/20 0957 12/04/20 0959 12/04/20 1001 12/04/20 1159  BP: 131/69 131/75 114/72 123/73  Pulse: 60 61 89 68  Resp: 16 16 16 16   Temp: 98 F (36.7 C) 98 F (36.7 C) 98 F (36.7 C) 98.5 F (36.9 C)  TempSrc:    Oral  SpO2: 100% 96% 94% 100%  Weight:      Height:        Intake/Output Summary (Last 24 hours) at 12/04/2020 1302 Last data filed at 12/04/2020 0954 Gross per 24 hour  Intake 940 ml  Output --  Net 940 ml   Filed Weights   12/03/20 1258  Weight: 89.8 kg    Examination:  General exam: Appears calm and comfortable  Respiratory system: Clear to auscultation. Respiratory effort normal. Cardiovascular system: S1 & S2 heard, RRR. No JVD, murmurs, rubs, gallops or clicks. No pedal edema. Gastrointestinal system: Abdomen is nondistended, soft and nontender. No organomegaly or masses felt. Normal bowel sounds heard. Central nervous system: Alert and not oriented to place  Extremities; no edema   Data Reviewed: I have personally reviewed following labs and imaging studies  CBC: Recent Labs  Lab 12/03/20 1240 12/04/20 0347  WBC 8.5 7.6  HGB 14.4 13.9  HCT 43.5 41.8  MCV 91.6 91.5  PLT 126* 122*  Basic Metabolic Panel: Recent Labs  Lab 12/03/20 1240 12/04/20 0347  NA 138 137  K 3.9 3.7  CL 107 106  CO2 21* 25  GLUCOSE 156* 128*  BUN 13 11  CREATININE 1.10 0.84  CALCIUM 8.9 9.3  MG  --  2.0   GFR: Estimated Creatinine Clearance: 90.6 mL/min (by C-G formula based on SCr of 0.84 mg/dL). Liver Function Tests: No results for input(s): AST, ALT, ALKPHOS, BILITOT, PROT, ALBUMIN in the last 168 hours. No results for input(s): LIPASE, AMYLASE in the last 168 hours. No results for input(s): AMMONIA in the last 168 hours. Coagulation Profile: No results for input(s): INR,  PROTIME in the last 168 hours. Cardiac Enzymes: No results for input(s): CKTOTAL, CKMB, CKMBINDEX, TROPONINI in the last 168 hours. BNP (last 3 results) No results for input(s): PROBNP in the last 8760 hours. HbA1C: Recent Labs    12/04/20 0347  HGBA1C 5.7*   CBG: Recent Labs  Lab 12/03/20 1314  GLUCAP 172*   Lipid Profile: No results for input(s): CHOL, HDL, LDLCALC, TRIG, CHOLHDL, LDLDIRECT in the last 72 hours. Thyroid Function Tests: Recent Labs    12/03/20 1556  TSH 0.480   Anemia Panel: Recent Labs    12/04/20 0347  VITAMINB12 289   Sepsis Labs: No results for input(s): PROCALCITON, LATICACIDVEN in the last 168 hours.  Recent Results (from the past 240 hour(s))  Resp Panel by RT-PCR (Flu A&B, Covid) Urine, Clean Catch     Status: None   Collection Time: 12/03/20  3:56 PM   Specimen: Urine, Clean Catch; Nasopharyngeal(NP) swabs in vial transport medium  Result Value Ref Range Status   SARS Coronavirus 2 by RT PCR NEGATIVE NEGATIVE Final    Comment: (NOTE) SARS-CoV-2 target nucleic acids are NOT DETECTED.  The SARS-CoV-2 RNA is generally detectable in upper respiratory specimens during the acute phase of infection. The lowest concentration of SARS-CoV-2 viral copies this assay can detect is 138 copies/mL. A negative result does not preclude SARS-Cov-2 infection and should not be used as the sole basis for treatment or other patient management decisions. A negative result may occur with  improper specimen collection/handling, submission of specimen other than nasopharyngeal swab, presence of viral mutation(s) within the areas targeted by this assay, and inadequate number of viral copies(<138 copies/mL). A negative result must be combined with clinical observations, patient history, and epidemiological information. The expected result is Negative.  Fact Sheet for Patients:  BloggerCourse.com  Fact Sheet for Healthcare Providers:   SeriousBroker.it  This test is no t yet approved or cleared by the [place] FDA and  has been authorized for detection and/or diagnosis of SARS-CoV-2 by FDA under an Emergency Use Authorization (EUA). This EUA will remain  in effect (meaning this test can be used) for the duration of the COVID-19 declaration under Section 564(b)(1) of the Act, 21 U.S.C.section 360bbb-3(b)(1), unless the authorization is terminated  or revoked sooner.       Influenza A by PCR NEGATIVE NEGATIVE Final   Influenza B by PCR NEGATIVE NEGATIVE Final    Comment: (NOTE) The Xpert Xpress SARS-CoV-2/FLU/RSV plus assay is intended as an aid in the diagnosis of influenza from Nasopharyngeal swab specimens and should not be used as a sole basis for treatment. Nasal washings and aspirates are unacceptable for Xpert Xpress SARS-CoV-2/FLU/RSV testing.  Fact Sheet for Patients: BloggerCourse.com  Fact Sheet for Healthcare Providers: SeriousBroker.it  This test is not yet approved or cleared by the [place] and  has been authorized for detection and/or diagnosis of SARS-CoV-2 by FDA under an Emergency Use Authorization (EUA). This EUA will remain in effect (meaning this test can be used) for the duration of the COVID-19 declaration under Section 564(b)(1) of the Act, 21 U.S.C. section 360bbb-3(b)(1), unless the authorization is terminated or revoked.  Performed at [facility], [address] [place], [place] [address]          Radiology Studies: CT Head Wo Contrast  Result Date: 12/03/2020 CLINICAL DATA:  Altered mental status. EXAM: CT HEAD WITHOUT CONTRAST TECHNIQUE: Contiguous axial images were obtained from the base of the skull through the vertex without intravenous contrast. COMPARISON:  January 03, 2013. FINDINGS: Brain: Mild chronic ischemic white matter disease is noted. No mass effect or midline  shift is noted. Ventricular size is within normal limits. There is no evidence of mass lesion, hemorrhage or acute infarction. Vascular: No hyperdense vessel or unexpected calcification. Skull: Normal. Negative for fracture or focal lesion. Sinuses/Orbits: No acute finding. Other: None. IMPRESSION: No acute intracranial abnormality seen. Electronically Signed   By: Lupita Raider M.D.   On: 12/03/2020 14:20   MR BRAIN WO CONTRAST  Result Date: 12/04/2020 CLINICAL DATA:  Mental status change. EXAM: MRI HEAD WITHOUT CONTRAST TECHNIQUE: Multiplanar, multiecho pulse sequences of the brain and surrounding structures were obtained without intravenous contrast. COMPARISON:  CT head Dec 03, 2020. FINDINGS: Motion limited exam.  Within this limitation: Brain: No acute infarct. 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 Advanced, confluent and patchy T2/FLAIR hyperintensity within the white matter, nonspecific but most likely related to chronic microvascular ischemic disease. No acute hemorrhage. No hydrocephalus. No extra-axial fluid collection. No mass lesion or abnormal mass effect. Vascular: Major arterial flow voids are maintained at the skull base. Skull and upper cervical spine: Normal marrow signal. Sinuses/Orbits: Clear sinuses.  Unremarkable orbits. Other: No mastoid effusions. IMPRESSION: 1. No evidence of acute intracranial abnormality on this motion limited exam. 2. 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 Electronically Signed   By: Feliberto Harts MD   On: 12/04/2020 12:37        Scheduled Meds: . citalopram  40 mg Oral Daily  . cyanocobalamin  1,000 mcg Intramuscular Daily  . enoxaparin (LOVENOX) injection  40 mg Subcutaneous Q24H  . fluPHENAZine  5 mg Oral Daily  . risperiDONE  1 mg Oral Daily  . simvastatin  20 mg Oral Daily  . tamsulosin  0.4 mg Oral QPC supper  . traZODone  50 mg Oral QHS   Continuous Infusions: .  sodium chloride 100 mL/hr at 12/04/20 1239     LOS: 0 days    Time spent: 35 minutes.     Alba Cory, MD [facility]   If 7PM-7AM, please contact night-coverage [email or web address]  12/04/2020, 1:02 PM

## 2020-12-04 NOTE — Evaluation (Signed)
Physical Therapy Evaluation Patient Details Name: Maksymilian Mabey MRN: [id number] DOB: 1949-10-12 Today's Date: 12/04/2020   History of Present Illness  70 yo Male presents to hospital with near syncope episodes (x2). Patient denies any falls but does report feeling slightly unsteady. Upon admission, all labs were WNL with exception of slightly elevated glucose. Unsure of etiology of dizziness/near syncope. Patient denies any current symptoms. PMH significant: HTN, Hyperglycemia, Bipolar disorder, depression, schizophrenia.  Clinical Impression  70 yo Male reports feeling good this morning and agreeable to physical therapy. He is independent in bed mobility and sit<>stand transfers. He does require min guard/CGA when walking without assistive device. Patient is a poor historian but was aware of place, situation and birthday. He reports that he does not use an assistive device at his group home and denies any recent falls. He does ambulate currently with slower gait speed and occasional veering side to side. He reports his walking is different from baseline. Patient's HR did increase to high 90's low 100's during ambulation with elevated ST changes. He denies any dizziness during ambulation. He would benefit from skilled PT intervention to improve balance and gait safety; Consider home health PT upon discharge to ensure safety in group home.     Follow Up Recommendations Home health PT    Equipment Recommendations  None recommended by PT    Recommendations for Other Services Rehab consult     Precautions / Restrictions Precautions Precautions: Fall Restrictions Weight Bearing Restrictions: No      Mobility  Bed Mobility Overal bed mobility: Independent                  Transfers Overall transfer level: Independent                  Ambulation/Gait Ambulation/Gait assistance: Min guard Gait Distance (Feet): 175 Feet Assistive device: None Gait Pattern/deviations:  Step-through pattern Gait velocity: decreased   General Gait Details: exhibits slower gait speed with slight veering side/side requiring CGA for safety  Stairs            Wheelchair Mobility    Modified Rankin (Stroke Patients Only)       Balance Overall balance assessment: Mild deficits observed, not formally tested                                           Pertinent Vitals/Pain Pain Assessment: No/denies pain    Home Living Family/patient expects to be discharged to:: Group home                 Additional Comments: 2 story home,but patient resides on mail floor; Has 2 steps to enter with B rails;    Prior Function Level of Independence: Needs assistance   Gait / Transfers Assistance Needed: doesn't use any assistive device  ADL's / Homemaking Assistance Needed: was independent in bathing/dressing, but group home made all meals and assists with medication        Hand Dominance   Dominant Hand: Right    Extremity/Trunk Assessment   Upper Extremity Assessment Upper Extremity Assessment: Overall WFL for tasks assessed    Lower Extremity Assessment Lower Extremity Assessment: Overall WFL for tasks assessed    Cervical / Trunk Assessment Cervical / Trunk Assessment: Normal  Communication   Communication: No difficulties  Cognition Arousal/Alertness: Awake/alert Behavior During Therapy: WFL for tasks assessed/performed Overall Cognitive Status: Difficult  to assess                                 General Comments: oriented to place, situation and birthdate; unsure of baseline;      General Comments General comments (skin integrity, edema, etc.): intact skin integrity; no swelling noted;    Exercises     Assessment/Plan    PT Assessment Patient needs continued PT services  PT Problem List Decreased balance;Decreased mobility       PT Treatment Interventions Gait training;Functional mobility  training;Therapeutic activities;Therapeutic exercise;Balance training;Neuromuscular re-education;Patient/family education    PT Goals (Current goals can be found in the Care Plan section)  Acute Rehab PT Goals Patient Stated Goal: to go home. PT Goal Formulation: With patient Time For Goal Achievement: 12/18/20 Potential to Achieve Goals: Good    Frequency Min 2X/week   Barriers to discharge   none    Co-evaluation               AM-PAC PT "6 Clicks" Mobility  Outcome Measure Help needed turning from your back to your side while in a flat bed without using bedrails?: None Help needed moving from lying on your back to sitting on the side of a flat bed without using bedrails?: None Help needed moving to and from a bed to a chair (including a wheelchair)?: A Little Help needed standing up from a chair using your arms (e.g., wheelchair or bedside chair)?: None Help needed to walk in hospital room?: A Little Help needed climbing 3-5 steps with a railing? : A Little 6 Click Score: 21    End of Session Equipment Utilized During Treatment: Gait belt Activity Tolerance: Patient tolerated treatment well Patient left: in bed;with bed alarm set;with call bell/phone within reach Nurse Communication: Mobility status PT Visit Diagnosis: Unsteadiness on feet (R26.81)    Time: 7867-6720 PT Time Calculation (min) (ACUTE ONLY): 32 min   Charges:   PT Evaluation $PT Eval Low Complexity: 1 Low          Donna Snooks PT, DPT 12/04/2020, 12:14 PM

## 2020-12-04 NOTE — Evaluation (Signed)
Occupational Therapy Evaluation Patient Details Name: [name] MRN: [id number] DOB: 1950/04/15 Today's Date: 12/04/2020    History of Present Illness 70 yo Male presents to hospital with near syncope episodes (x2). Patient denies any falls but does report feeling slightly unsteady. Upon admission, all labs were WNL with exception of slightly elevated glucose. Unsure of etiology of dizziness/near syncope. Patient denies any current symptoms. PMH significant: HTN, Hyperglycemia, Bipolar disorder, depression, schizophrenia.   Clinical Impression   Mr. Schrieber was seen for OT evaluation this date. Prior to hospital admission, pt was independent for BADL management and functional mobility. Pt lives at a group home where staff assist him with medication management and IADL tasks. Pt presents at or near baseline level of independence to perform ADL tasks with no strength or sensory deficits appreciated with assessment. He is able to complete standing grooming tasks at sink and functional mobility without physical assist or additional prompting. Pt states he feels back to baseline level of functional independence. No skilled OT needs identified. Will sign off. Please re-consult if additional OT needs arise during this admission.      Follow Up Recommendations  No OT follow up    Equipment Recommendations  None recommended by OT    Recommendations for Other Services       Precautions / Restrictions Precautions Precautions: Fall Restrictions Weight Bearing Restrictions: No      Mobility Bed Mobility Overal bed mobility: Independent             General bed mobility comments: Increased time/effort, but no physical assist.    Transfers Overall transfer level: Independent                    Balance Overall balance assessment: No apparent balance deficits (not formally assessed)                                         ADL either performed or assessed  with clinical judgement   ADL Overall ADL's : At baseline                                       General ADL Comments: Pt performs functional mobility witout an AD. He is able to complete standing grooming tasks at sink without assist. He appears at or near baseline level of functional independence for ADL management.     Vision Patient Visual Report: No change from baseline       Perception     Praxis      Pertinent Vitals/Pain Pain Assessment: No/denies pain     Hand Dominance Right   Extremity/Trunk Assessment Upper Extremity Assessment Upper Extremity Assessment: Overall WFL for tasks assessed   Lower Extremity Assessment Lower Extremity Assessment: Overall WFL for tasks assessed   Cervical / Trunk Assessment Cervical / Trunk Assessment: Normal   Communication Communication Communication: No difficulties   Cognition Arousal/Alertness: Awake/alert Behavior During Therapy: WFL for tasks assessed/performed Overall Cognitive Status: Within Functional Limits for tasks assessed                                 General Comments: Pt is oriented to self, place, and situation. Follows VCs consistently without additional cueing.   General Comments  intact  skin integrity; no swelling noted;    Exercises Other Exercises Other Exercises: Pt educated on role of OT in acute setting.   Shoulder Instructions      Home Living Family/patient expects to be discharged to:: Group home                                 Additional Comments: 2 story home,but patient resides on mail floor; Has 2 steps to enter with B rails;      Prior Functioning/Environment Level of Independence: Needs assistance  Gait / Transfers Assistance Needed: doesn't use any assistive device ADL's / Homemaking Assistance Needed: Independent with ADL management including bathing and dressing. Group home staff prepare meals, assist with med mgt, etc. Communication /  Swallowing Assistance Needed: NA          OT Problem List: Decreased coordination;Decreased safety awareness;Impaired balance (sitting and/or standing);Cardiopulmonary status limiting activity      OT Treatment/Interventions:      OT Goals(Current goals can be found in the care plan section) Acute Rehab OT Goals Patient Stated Goal: to go home. OT Goal Formulation: All assessment and education complete, DC therapy Time For Goal Achievement: 12/04/20  OT Frequency:     Barriers to D/C:            Co-evaluation              AM-PAC OT "6 Clicks" Daily Activity     Outcome Measure Help from another person eating meals?: None Help from another person taking care of personal grooming?: None Help from another person toileting, which includes using toliet, bedpan, or urinal?: None Help from another person bathing (including washing, rinsing, drying)?: A Little Help from another person to put on and taking off regular upper body clothing?: None Help from another person to put on and taking off regular lower body clothing?: A Little 6 Click Score: 22   End of Session Equipment Utilized During Treatment: Gait belt Nurse Communication: Mobility status  Activity Tolerance: Patient tolerated treatment well Patient left: in bed;with call bell/phone within reach;with bed alarm set  OT Visit Diagnosis: Other abnormalities of gait and mobility (R26.89)                Time: 6433-2951 OT Time Calculation (min): 13 min Charges:  OT General Charges $OT Visit: 1 Visit OT Evaluation $OT Eval Low Complexity: 1 Low  Rockney Ghee, M.S., OTR/L Ascom: [phone] 12/04/20, 3:10 PM

## 2020-12-04 NOTE — Care Management Obs Status (Signed)
MEDICARE OBSERVATION STATUS NOTIFICATION   Patient Details  Name: Kveon Casanas MRN: [id number] Date of Birth: Jul 12, 1950   Medicare Observation Status Notification Given:  Yes    Allayne Butcher, RN 12/04/2020, 10:31 AM

## 2020-12-04 NOTE — Progress Notes (Signed)
*  PRELIMINARY RESULTS* Echocardiogram 2D Echocardiogram has been performed.  Cristela Blue 12/04/2020, 1:36 PM

## 2020-12-04 NOTE — TOC Initial Note (Signed)
Transition of Care [facility]) - Initial/Assessment Note    Patient Details  Name: [name] MRN: [id number] Date of Birth: [birth year]  Transition of [facility]) CM/SW Contact:    Shelbie Hutching, RN Phone Number: 12/04/2020, 10:26 AM  Clinical Narrative:                 Patient placed under observation for syncopal episode at his group home, [facility].  RNCM met with patient at the bedside but patient could not tell me where he was or that he lived in [place].  He reports he lives in [place] and did not know his current location.  Reoriented patient to hospital.  Patient lives at [facility], the group home owner is the only contact person the patient has, he does not have family.   Timmy reports that patient is normally oriented and can answer questions.  MOON reviewed with Timmy and updated on plan of care at this time.   TOC will cont to follow and keep Timmy updated.    Expected Discharge Plan: Group Home Barriers to Discharge: Continued Medical Work up   Patient Goals and CMS Choice Patient states their goals for this hospitalization and ongoing recovery are:: Patient is unable to state goals      Expected Discharge Plan and Services Expected Discharge Plan: Group Home   Discharge Planning Services: CM Consult   Living arrangements for the past 2 months: Group Home                 DME Arranged: N/A DME Agency: NA                  Prior Living Arrangements/Services Living arrangements for the past 2 months: Group Home Lives with:: Facility Resident Patient language and need for interpreter reviewed:: Yes Do you feel safe going back to the place where you live?: Yes      Need for Family Participation in Patient Care: Yes (Comment) (group home resident) Care giver support system in place?: Yes (comment) (group home)   Criminal Activity/Legal Involvement Pertinent to Current Situation/Hospitalization: No - Comment as needed  Activities of  Daily Living Home Assistive Devices/Equipment: Walker (specify type) (front wheel walker) ADL Screening (condition at time of admission) Patient's cognitive ability adequate to safely complete daily activities?: Yes Is the patient deaf or have difficulty hearing?: No Does the patient have difficulty seeing, even when wearing glasses/contacts?: No Does the patient have difficulty concentrating, remembering, or making decisions?: No Patient able to express need for assistance with ADLs?: Yes Does the patient have difficulty dressing or bathing?: Yes Independently performs ADLs?: No Communication: Needs assistance Is this a change from baseline?: Pre-admission baseline Dressing (OT): Needs assistance Is this a change from baseline?: Pre-admission baseline Grooming: Needs assistance Is this a change from baseline?: Pre-admission baseline Feeding: Independent Bathing: Needs assistance Is this a change from baseline?: Pre-admission baseline Toileting: Needs assistance Is this a change from baseline?: Pre-admission baseline In/Out Bed: Needs assistance Is this a change from baseline?: Pre-admission baseline Walks in Home: Needs assistance Is this a change from baseline?: Pre-admission baseline Does the patient have difficulty walking or climbing stairs?: No Weakness of Legs: None Weakness of Arms/Hands: None  Permission Sought/Granted Permission sought to share information with : Case Manager,Facility Sport and exercise psychologist Permission granted to share information with : Yes, Verbal Permission Granted  Share Information with NAME: [name]  Permission granted to share info w AGENCY: [facility]  granted to share info w Relationship: Group home owner     Emotional Assessment Appearance:: Appears stated age Attitude/Demeanor/Rapport: Other (comment) Affect (typically observed): Quiet Orientation: : Oriented to Self Alcohol / Substance Use: Not Applicable Psych  Involvement: No (comment)  Admission diagnosis:  Syncope [R55] Near syncope [R55] Altered mental status, unspecified altered mental status type [R41.82] Patient Active Problem List   Diagnosis Date Noted  . Syncope 12/03/2020  . Vitamin D deficiency 08/29/2015  . Prediabetes 08/29/2015  . Hypertension 08/28/2015  . GERD (gastroesophageal reflux disease) 08/28/2015  . Hyperlipidemia 08/28/2015  . BPH (benign prostatic hyperplasia) 08/28/2015  . Hx of sleep apnea 08/28/2015  . COPD (chronic obstructive pulmonary disease) ([place]) 08/28/2015  . Smoker 08/28/2015  . Hx of lower gastrointestinal bleeding 08/28/2015  . Hx of gastritis 08/28/2015  . Bipolar 1 disorder ([place]) 08/28/2015  . Schizophrenia, paranoid ([place]) 08/28/2015   PCP:  Merryl Hacker No Pharmacy:   [place], [place] - [address] [place] [address] Phone: [phone] Fax: [phone]     Social Determinants of Health (SDOH) Interventions    Readmission Risk Interventions No flowsheet data found.

## 2020-12-05 DIAGNOSIS — R55 Syncope and collapse: Secondary | ICD-10-CM | POA: Diagnosis not present

## 2020-12-05 LAB — CBC
HCT: 42 % (ref 39.0–52.0)
Hemoglobin: 14 g/dL (ref 13.0–17.0)
MCH: 30.3 pg (ref 26.0–34.0)
MCHC: 33.3 g/dL (ref 30.0–36.0)
MCV: 90.9 fL (ref 80.0–100.0)
Platelets: 124 10*3/uL — ABNORMAL LOW (ref 150–400)
RBC: 4.62 MIL/uL (ref 4.22–5.81)
RDW: 14 % (ref 11.5–15.5)
WBC: 7.3 10*3/uL (ref 4.0–10.5)
nRBC: 0 % (ref 0.0–0.2)

## 2020-12-05 LAB — BASIC METABOLIC PANEL
Anion gap: 6 (ref 5–15)
BUN: 11 mg/dL (ref 8–23)
CO2: 24 mmol/L (ref 22–32)
Calcium: 9 mg/dL (ref 8.9–10.3)
Chloride: 108 mmol/L (ref 98–111)
Creatinine, Ser: 0.87 mg/dL (ref 0.61–1.24)
GFR, Estimated: >60 mL/min (ref >60)
Glucose, Bld: 79 mg/dL (ref 70–99)
Potassium: 4.1 mmol/L (ref 3.5–5.1)
Sodium: 138 mmol/L (ref 135–145)

## 2020-12-05 LAB — MAGNESIUM: Magnesium: 1.8 mg/dL (ref 1.7–2.4)

## 2020-12-05 MED ORDER — VITAMIN B-12 100 MCG PO TABS: 100.0000 ug | ORAL_TABLET | Freq: Every day | ORAL | 3 refills | Status: AC

## 2020-12-05 NOTE — Discharge Summary (Signed)
Physician Discharge Summary  Berdine Dancerdell [place] WUJ:[id number] DOB: Aug 15, 1949 DOA: 12/03/2020  PCP: Pcp, No  Admit date: 12/03/2020 Discharge date: 12/05/2020  Admitted From: Home  Disposition:  Home   Recommendations for Outpatient Follow-up:  1. Follow up with PCP in 1-2 weeks 2. Please obtain BMP/CBC in one week    Discharge Condition: Stable.  CODE STATUS: Full Code Diet recommendation: Heart Healthy   Brief/Interim Summary: 70 year old with past medical history significant for bipolar, schizophrenia, hypertension who presented from group home with near syncope.  Per facility staff, patient had a near syncopal event while taking a shower.  Later that day he had another episode and EMS was called.  Patient reported feeling dizzy.  He denies chest pain dyspnea nausea vomiting.  Patient is noted to be disoriented today, not at his baseline.   1-Near syncope/Dizziness:  Orthostatic vitals positive on 5/18. Received IV fluids. Orthostatic vitals negative today.  B12 normal low range.  started on B 12 supplement.  MRI negative for acute stroke Monitor on telemetry  ECHO; normal EF.   2-Schizophrenia:  Continue with Celexa, Risperidone,Fluphenazine and trazodone.   3-HTN;  Continue to hold lisinopril. Resume Norvasc.  Monitor orthostatic Vitals. Orthostatic vital normal.   4-Hyperglycemia:  HB-A1c: 5.7, pre diabetes. Diet modifications.   Mild thrombocytopenia; Monitor.  Acute encephalopathy, metabolic, delirium. Confusion: MRI negative for Stroke, treat for dehydration.  Ammonia normal. Probably component of dementia.      Discharge Diagnoses:  Active Problems:   Syncope    Discharge Instructions  Discharge Instructions    Diet - low sodium heart healthy   Complete by: As directed    Increase activity slowly   Complete by: As directed      Allergies as of 12/05/2020      Reactions   Asa [aspirin]    Hx GI bleed      Medication List    STOP  taking these medications   lisinopril 20 MG tablet Commonly known as: ZESTRIL     TAKE these medications   amLODipine 10 MG tablet Commonly known as: NORVASC TAKE 1 TABLET BY MOUTH DAILY   citalopram 40 MG tablet Commonly known as: CELEXA Take 1 tablet (40 mg total) by mouth daily.   fluPHENAZine 5 MG tablet Commonly known as: PROLIXIN Take 1 tablet (5 mg total) by mouth daily.   risperiDONE 1 MG tablet Commonly known as: RISPERDAL Take 1 tablet (1 mg total) by mouth daily.   simvastatin 20 MG tablet Commonly known as: ZOCOR TAKE 1 TABLET BY MOUTH DAILY   tamsulosin 0.4 MG Caps capsule Commonly known as: FLOMAX TAKE 1 CAPSULE BY MOUTH DAILY AFTER SUPPER   traZODone 50 MG tablet Commonly known as: DESYREL Take 1 tablet (50 mg total) by mouth at bedtime.   vitamin B-12 100 MCG tablet Commonly known as: CYANOCOBALAMIN Take 1 tablet (100 mcg total) by mouth daily.   Vitamin D3 25 MCG (1000 UT) Caps Take 1 capsule (1,000 Units total) by mouth daily.       Allergies  Allergen Reactions  . Asa [Aspirin]     Hx GI bleed    Consultations:  None   Procedures/Studies: CT Head Wo Contrast  Result Date: 12/03/2020 CLINICAL DATA:  Altered mental status. EXAM: CT HEAD WITHOUT CONTRAST TECHNIQUE: Contiguous axial images were obtained from the base of the skull through the vertex without intravenous contrast. COMPARISON:  January 03, 2013. FINDINGS: Brain: Mild chronic ischemic white matter disease is noted. No mass effect or  midline shift is noted. Ventricular size is within normal limits. There is no evidence of mass lesion, hemorrhage or acute infarction. Vascular: No hyperdense vessel or unexpected calcification. Skull: Normal. Negative for fracture or focal lesion. Sinuses/Orbits: No acute finding. Other: None. IMPRESSION: No acute intracranial abnormality seen. Electronically Signed   By: Lupita Raider M.D.   On: 12/03/2020 14:20   MR BRAIN WO CONTRAST  Result Date:  12/04/2020 CLINICAL DATA:  Mental status change. EXAM: MRI HEAD WITHOUT CONTRAST TECHNIQUE: Multiplanar, multiecho pulse sequences of the brain and surrounding structures were obtained without intravenous contrast. COMPARISON:  CT head Dec 03, 2020. FINDINGS: Motion limited exam.  Within this limitation: Brain: No acute infarct. 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 Advanced, confluent and patchy T2/FLAIR hyperintensity within the white matter, nonspecific but most likely related to chronic microvascular ischemic disease. No acute hemorrhage. No hydrocephalus. No extra-axial fluid collection. No mass lesion or abnormal mass effect. Vascular: Major arterial flow voids are maintained at the skull base. Skull and upper cervical spine: Normal marrow signal. Sinuses/Orbits: Clear sinuses.  Unremarkable orbits. Other: No mastoid effusions. IMPRESSION: 1. No evidence of acute intracranial abnormality on this motion limited exam. 2. 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 Electronically Signed   By: Feliberto Harts MD   On: 12/04/2020 12:37   ECHOCARDIOGRAM COMPLETE  Result Date: 12/04/2020    ECHOCARDIOGRAM REPORT   Patient Name:   Indy Passe Date of Exam: 12/04/2020 Medical Rec #:  [id number]     Height:       69.0 in Accession #:    [id number]    Weight:       198.0 lb Date of Birth:  16-May-1950     BSA:          2.057 m Patient Age:    70 years      BP:           123/73 mmHg Patient Gender: M             HR:           68 bpm. Exam Location:  [facility] Procedure: 2D Echo, Cardiac Doppler and Color Doppler Indications:     Syncope R55  History:         Patient has prior history of Echocardiogram examinations, most                  recent 07/19/2019. Risk Factors:Hypertension. Bipolar disorder.  Sonographer:     Cristela Blue RDCS (AE) Referring Phys:  [id number] Inetta Fermo LAI Diagnosing Phys: Debbe Odea MD  Sonographer Comments: Suboptimal apical window  and Technically challenging study due to limited acoustic windows. IMPRESSIONS  1. Left ventricular ejection fraction, by estimation, is 60 to 65%. The left ventricle has normal function. The left ventricle has no regional wall motion abnormalities. There is mild left ventricular hypertrophy. Left ventricular diastolic parameters were normal.  2. Right ventricular systolic function is normal. The right ventricular size is normal.  3. The mitral valve is normal in structure. Trivial mitral valve regurgitation.  4. The aortic valve is calcified. Aortic valve regurgitation is not visualized. Mild aortic valve stenosis. FINDINGS  Left Ventricle: Left ventricular ejection fraction, by estimation, is 60 to 65%. The left ventricle has normal function. The left ventricle has no regional wall motion abnormalities. The left ventricular internal cavity size was normal in size. There is  mild left ventricular hypertrophy. Left ventricular diastolic parameters  were normal. Right Ventricle: The right ventricular size is normal. No increase in right ventricular wall thickness. Right ventricular systolic function is normal. Left Atrium: Left atrial size was normal in size. Right Atrium: Right atrial size was normal in size. Pericardium: There is no evidence of pericardial effusion. Mitral Valve: The mitral valve is normal in structure. Trivial mitral valve regurgitation. Tricuspid Valve: The tricuspid valve is normal in structure. Tricuspid valve regurgitation is mild. Aortic Valve: The aortic valve is calcified. Aortic valve regurgitation is not visualized. Mild aortic stenosis is present. Aortic valve mean gradient measures 12.7 mmHg. Aortic valve peak gradient measures 21.0 mmHg. Aortic valve area, by VTI measures 0.95 cm. Pulmonic Valve: The pulmonic valve was not well visualized. Pulmonic valve regurgitation is not visualized. Aorta: The aortic root is normal in size and structure. Venous: The inferior vena cava was not well  visualized. IAS/Shunts: No atrial level shunt detected by color flow Doppler.  LEFT VENTRICLE PLAX 2D LVIDd:         4.44 cm  Diastology LVIDs:         2.51 cm  LV e' medial:    9.68 cm/s LV PW:         1.41 cm  LV E/e' medial:  7.4 LV IVS:        0.91 cm  LV e' lateral:   7.40 cm/s LVOT diam:     2.20 cm  LV E/e' lateral: 9.7 LV SV:         45 LV SV Index:   22 LVOT Area:     3.80 cm  RIGHT VENTRICLE RV Basal diam:  3.64 cm RV S prime:     12.10 cm/s TAPSE (M-mode): 3.4 cm LEFT ATRIUM         Index LA diam:    3.10 cm 1.51 cm/m  AORTIC VALVE                    PULMONIC VALVE AV Area (Vmax):    1.04 cm     RVOT Peak grad: 4 mmHg AV Area (Vmean):   0.90 cm AV Area (VTI):     0.95 cm AV Vmax:           229.00 cm/s AV Vmean:          163.000 cm/s AV VTI:            0.477 m AV Peak Grad:      21.0 mmHg AV Mean Grad:      12.7 mmHg LVOT Vmax:         62.70 cm/s LVOT Vmean:        38.800 cm/s LVOT VTI:          0.119 m LVOT/AV VTI ratio: 0.25  AORTA Ao Root diam: 3.10 cm MITRAL VALVE               TRICUSPID VALVE MV Area (PHT): 3.17 cm    TR Peak grad:   21.5 mmHg MV Decel Time: 239 msec    TR Vmax:        232.00 cm/s MV E velocity: 71.60 cm/s MV A velocity: 65.60 cm/s  SHUNTS MV E/A ratio:  1.09        Systemic VTI:  0.12 m                            Systemic Diam: 2.20 cm Debbe Odea MD Electronically signed by  Debbe Odea MD Signature Date/Time: 12/04/2020/5:28:27 PM    Final      Subjective: He is alert, denies dizziness. Oriented to person.   Discharge Exam: Vitals:   12/05/20 0327 12/05/20 1106  BP: 133/73 (!) 152/65  Pulse: (!) 55 60  Resp: 14 16  Temp: 97.8 F (36.6 C) 97.9 F (36.6 C)  SpO2: 95% 99%     General: Pt is alert, awake, not in acute distress Cardiovascular: RRR, S1/S2 +, no rubs, no gallops Respiratory: CTA bilaterally, no wheezing, no rhonchi Abdominal: Soft, NT, ND, bowel sounds + Extremities: no edema, no cyanosis    The results of significant  diagnostics from this hospitalization (including imaging, microbiology, ancillary and laboratory) are listed below for reference.     Microbiology: Recent Results (from the past 240 hour(s))  Resp Panel by RT-PCR (Flu A&B, Covid) Urine, Clean Catch     Status: None   Collection Time: 12/03/20  3:56 PM   Specimen: Urine, Clean Catch; Nasopharyngeal(NP) swabs in vial transport medium  Result Value Ref Range Status   SARS Coronavirus 2 by RT PCR NEGATIVE NEGATIVE Final    Comment: (NOTE) SARS-CoV-2 target nucleic acids are NOT DETECTED.  The SARS-CoV-2 RNA is generally detectable in upper respiratory specimens during the acute phase of infection. The lowest concentration of SARS-CoV-2 viral copies this assay can detect is 138 copies/mL. A negative result does not preclude SARS-Cov-2 infection and should not be used as the sole basis for treatment or other patient management decisions. A negative result may occur with  improper specimen collection/handling, submission of specimen other than nasopharyngeal swab, presence of viral mutation(s) within the areas targeted by this assay, and inadequate number of viral copies(<138 copies/mL). A negative result must be combined with clinical observations, patient history, and epidemiological information. The expected result is Negative.  Fact Sheet for Patients:  BloggerCourse.com  Fact Sheet for Healthcare Providers:  SeriousBroker.it  This test is no t yet approved or cleared by the [place] FDA and  has been authorized for detection and/or diagnosis of SARS-CoV-2 by FDA under an Emergency Use Authorization (EUA). This EUA will remain  in effect (meaning this test can be used) for the duration of the COVID-19 declaration under Section 564(b)(1) of the Act, 21 U.S.C.section 360bbb-3(b)(1), unless the authorization is terminated  or revoked sooner.       Influenza A by PCR NEGATIVE  NEGATIVE Final   Influenza B by PCR NEGATIVE NEGATIVE Final    Comment: (NOTE) The Xpert Xpress SARS-CoV-2/FLU/RSV plus assay is intended as an aid in the diagnosis of influenza from Nasopharyngeal swab specimens and should not be used as a sole basis for treatment. Nasal washings and aspirates are unacceptable for Xpert Xpress SARS-CoV-2/FLU/RSV testing.  Fact Sheet for Patients: BloggerCourse.com  Fact Sheet for Healthcare Providers: SeriousBroker.it  This test is not yet approved or cleared by the [place] FDA and has been authorized for detection and/or diagnosis of SARS-CoV-2 by FDA under an Emergency Use Authorization (EUA). This EUA will remain in effect (meaning this test can be used) for the duration of the COVID-19 declaration under Section 564(b)(1) of the Act, 21 U.S.C. section 360bbb-3(b)(1), unless the authorization is terminated or revoked.  Performed at [facility], [address] [place], [place] [address]      Labs: BNP (last 3 results) No results for input(s): BNP in the last 8760 hours. Basic Metabolic Panel: Recent Labs  Lab 12/03/20 1240 12/04/20 0347 12/05/20 0351  NA  138 137 138  K 3.9 3.7 4.1  CL 107 106 108  CO2 21* 25 24  GLUCOSE 156* 128* 79  BUN 13 11 11   CREATININE 1.10 0.84 0.87  CALCIUM 8.9 9.3 9.0  MG  --  2.0 1.8   Liver Function Tests: No results for input(s): AST, ALT, ALKPHOS, BILITOT, PROT, ALBUMIN in the last 168 hours. No results for input(s): LIPASE, AMYLASE in the last 168 hours. Recent Labs  Lab 12/04/20 1348  AMMONIA 16   CBC: Recent Labs  Lab 12/03/20 1240 12/04/20 0347 12/05/20 0351  WBC 8.5 7.6 7.3  HGB 14.4 13.9 14.0  HCT 43.5 41.8 42.0  MCV 91.6 91.5 90.9  PLT 126* 122* 124*   Cardiac Enzymes: No results for input(s): CKTOTAL, CKMB, CKMBINDEX, TROPONINI in the last 168 hours. BNP: Invalid input(s): POCBNP CBG: Recent Labs  Lab  12/03/20 1314  GLUCAP 172*   D-Dimer No results for input(s): DDIMER in the last 72 hours. Hgb A1c Recent Labs    12/04/20 0347  HGBA1C 5.7*   Lipid Profile No results for input(s): CHOL, HDL, LDLCALC, TRIG, CHOLHDL, LDLDIRECT in the last 72 hours. Thyroid function studies Recent Labs    12/03/20 1556  TSH 0.480   Anemia work up Recent Labs    12/04/20 0347  VITAMINB12 289   Urinalysis    Component Value Date/Time   COLORURINE YELLOW (A) 12/03/2020 1556   APPEARANCEUR CLEAR (A) 12/03/2020 1556   APPEARANCEUR Clear 01/03/2013 0232   LABSPEC 1.017 12/03/2020 1556   LABSPEC 1.006 01/03/2013 0232   PHURINE 6.0 12/03/2020 1556   GLUCOSEU NEGATIVE 12/03/2020 1556   GLUCOSEU Negative 01/03/2013 0232   HGBUR NEGATIVE 12/03/2020 1556   BILIRUBINUR NEGATIVE 12/03/2020 1556   BILIRUBINUR Negative 01/03/2013 0232   KETONESUR NEGATIVE 12/03/2020 1556   PROTEINUR NEGATIVE 12/03/2020 1556   NITRITE NEGATIVE 12/03/2020 1556   LEUKOCYTESUR NEGATIVE 12/03/2020 1556   LEUKOCYTESUR Negative 01/03/2013 0232   Sepsis Labs Invalid input(s): PROCALCITONIN,  WBC,  LACTICIDVEN Microbiology Recent Results (from the past 240 hour(s))  Resp Panel by RT-PCR (Flu A&B, Covid) Urine, Clean Catch     Status: None   Collection Time: 12/03/20  3:56 PM   Specimen: Urine, Clean Catch; Nasopharyngeal(NP) swabs in vial transport medium  Result Value Ref Range Status   SARS Coronavirus 2 by RT PCR NEGATIVE NEGATIVE Final    Comment: (NOTE) SARS-CoV-2 target nucleic acids are NOT DETECTED.  The SARS-CoV-2 RNA is generally detectable in upper respiratory specimens during the acute phase of infection. The lowest concentration of SARS-CoV-2 viral copies this assay can detect is 138 copies/mL. A negative result does not preclude SARS-Cov-2 infection and should not be used as the sole basis for treatment or other patient management decisions. A negative result may occur with  improper specimen  collection/handling, submission of specimen other than nasopharyngeal swab, presence of viral mutation(s) within the areas targeted by this assay, and inadequate number of viral copies(<138 copies/mL). A negative result must be combined with clinical observations, patient history, and epidemiological information. The expected result is Negative.  Fact Sheet for Patients:  12/05/20  Fact Sheet for Healthcare Providers:  BloggerCourse.com  This test is no t yet approved or cleared by the [place] FDA and  has been authorized for detection and/or diagnosis of SARS-CoV-2 by FDA under an Emergency Use Authorization (EUA). This EUA will remain  in effect (meaning this test can be used) for the duration of the COVID-19 declaration under Section  564(b)(1) of the Act, 21 U.S.C.section 360bbb-3(b)(1), unless the authorization is terminated  or revoked sooner.       Influenza A by PCR NEGATIVE NEGATIVE Final   Influenza B by PCR NEGATIVE NEGATIVE Final    Comment: (NOTE) The Xpert Xpress SARS-CoV-2/FLU/RSV plus assay is intended as an aid in the diagnosis of influenza from Nasopharyngeal swab specimens and should not be used as a sole basis for treatment. Nasal washings and aspirates are unacceptable for Xpert Xpress SARS-CoV-2/FLU/RSV testing.  Fact Sheet for Patients: BloggerCourse.com  Fact Sheet for Healthcare Providers: SeriousBroker.it  This test is not yet approved or cleared by the [place] FDA and has been authorized for detection and/or diagnosis of SARS-CoV-2 by FDA under an Emergency Use Authorization (EUA). This EUA will remain in effect (meaning this test can be used) for the duration of the COVID-19 declaration under Section 564(b)(1) of the Act, 21 U.S.C. section 360bbb-3(b)(1), unless the authorization is terminated or revoked.  Performed at [facility], [address] [place], [place] [address]      Time coordinating discharge: 40 minutes  SIGNED:   Alba Cory, MD  [facility]

## 2020-12-05 NOTE — Progress Notes (Signed)
Patient left without dc paperwork today; okay to fax per Timmy (contact person) and requested Dr. Sunnie Nielsen send script to [facility] in [place]. Dr. Sunnie Nielsen requests we fax the script.

## 2020-12-05 NOTE — NC FL2 (Signed)
[place] MEDICAID FL2 LEVEL OF CARE SCREENING TOOL     IDENTIFICATION  Patient Name: [name] Birthdate: 1949-10-12 Sex: male Admission Date (Current Location): 12/03/2020  [place] Number:  Chiropodist and Address:  [facility], [address] [place], [place] [address]      Provider Number: [id number]  Attending Physician Name and Address:  Alba Cory, MD  Relative Name and Phone Number:       Current Level of Care: Hospital Recommended Level of Care: Other (Comment) (Group Home) Prior Approval Number:    Date Approved/Denied:   PASRR Number:    Discharge Plan: Other (Comment) (Group Home)    Current Diagnoses: Patient Active Problem List   Diagnosis Date Noted  . Syncope 12/03/2020  . Vitamin D deficiency 08/29/2015  . Prediabetes 08/29/2015  . Hypertension 08/28/2015  . GERD (gastroesophageal reflux disease) 08/28/2015  . Hyperlipidemia 08/28/2015  . BPH (benign prostatic hyperplasia) 08/28/2015  . Hx of sleep apnea 08/28/2015  . COPD (chronic obstructive pulmonary disease) (HCC) 08/28/2015  . Smoker 08/28/2015  . Hx of lower gastrointestinal bleeding 08/28/2015  . Hx of gastritis 08/28/2015  . Bipolar 1 disorder (HCC) 08/28/2015  . Schizophrenia, paranoid (HCC) 08/28/2015    Orientation RESPIRATION BLADDER Height & Weight     Self,Place  Normal Continent Weight: 89.8 kg Height:  5\' 9"  (175.3 cm)  BEHAVIORAL SYMPTOMS/MOOD NEUROLOGICAL BOWEL NUTRITION STATUS      Continent Diet (regular diet)  AMBULATORY STATUS COMMUNICATION OF NEEDS Skin   Supervision Verbally Normal                       Personal Care Assistance Level of Assistance  Bathing,Feeding,Dressing Bathing Assistance: Limited assistance Feeding assistance: Independent Dressing Assistance: Limited assistance     Functional Limitations Info             SPECIAL CARE FACTORS FREQUENCY  PT (By licensed PT)     PT  Frequency: Home Health 2 times per week              Contractures Contractures Info: Not present    Additional Factors Info  Code Status,Allergies Code Status Info: Full Allergies Info: ASA           Current Medications (12/05/2020):  This is the current hospital active medication list Current Facility-Administered Medications  Medication Dose Route Frequency Provider Last Rate Last Admin  . 0.9 %  sodium chloride infusion   Intravenous Continuous Regalado, Belkys A, MD 100 mL/hr at 12/05/20 0824 Infusion Verify at 12/05/20 0824  . acetaminophen (TYLENOL) tablet 1,000 mg  1,000 mg Oral TID PRN 12/07/20, MD      . alum & mag hydroxide-simeth (MAALOX/MYLANTA) 200-200-20 MG/5ML suspension 15 mL  15 mL Oral Q6H PRN 01-26-2001, MD      . calcium carbonate (TUMS - dosed in mg elemental calcium) chewable tablet 200 mg of elemental calcium  1 tablet Oral TID PRN Darlin Priestly, MD   200 mg of elemental calcium at 12/04/20 1051  . citalopram (CELEXA) tablet 40 mg  40 mg Oral Daily 12/06/20, MD   40 mg at 12/05/20 0916  . cyanocobalamin ((VITAMIN B-12)) injection 1,000 mcg  1,000 mcg Intramuscular Daily Regalado, Belkys A, MD   1,000 mcg at 12/05/20 0916  . docusate sodium (COLACE) capsule 100 mg  100 mg Oral BID PRN 12/07/20, MD      . enoxaparin (LOVENOX) injection 40  mg  40 mg Subcutaneous Q24H Darlin Priestly, MD   40 mg at 12/05/20 0916  . fluPHENAZine (PROLIXIN) tablet 5 mg  5 mg Oral Daily Darlin Priestly, MD   5 mg at 12/05/20 0916  . nicotine (NICODERM CQ - dosed in mg/24 hours) patch 14 mg  14 mg Transdermal Daily Regalado, Belkys A, MD   14 mg at 12/05/20 0917  . ondansetron (ZOFRAN) injection 4 mg  4 mg Intravenous Q6H PRN Darlin Priestly, MD      . ondansetron (ZOFRAN-ODT) disintegrating tablet 4 mg  4 mg Oral Q8H PRN Darlin Priestly, MD      . polyethylene glycol (MIRALAX / GLYCOLAX) packet 17 g  17 g Oral BID PRN Darlin Priestly, MD      . risperiDONE (RISPERDAL) tablet 1 mg  1 mg Oral Daily Darlin Priestly, MD   1  mg at 12/05/20 0916  . simvastatin (ZOCOR) tablet 20 mg  20 mg Oral Daily Darlin Priestly, MD   20 mg at 12/05/20 0916  . tamsulosin (FLOMAX) capsule 0.4 mg  0.4 mg Oral QPC supper Darlin Priestly, MD   0.4 mg at 12/04/20 1802  . traZODone (DESYREL) tablet 50 mg  50 mg Oral QHS Darlin Priestly, MD   50 mg at 12/04/20 2054     Discharge Medications: Medication List    STOP taking these medications   lisinopril 20 MG tablet Commonly known as: ZESTRIL     TAKE these medications   amLODipine 10 MG tablet Commonly known as: NORVASC TAKE 1 TABLET BY MOUTH DAILY   citalopram 40 MG tablet Commonly known as: CELEXA Take 1 tablet (40 mg total) by mouth daily.   fluPHENAZine 5 MG tablet Commonly known as: PROLIXIN Take 1 tablet (5 mg total) by mouth daily.   risperiDONE 1 MG tablet Commonly known as: RISPERDAL Take 1 tablet (1 mg total) by mouth daily.   simvastatin 20 MG tablet Commonly known as: ZOCOR TAKE 1 TABLET BY MOUTH DAILY   tamsulosin 0.4 MG Caps capsule Commonly known as: FLOMAX TAKE 1 CAPSULE BY MOUTH DAILY AFTER SUPPER   traZODone 50 MG tablet Commonly known as: DESYREL Take 1 tablet (50 mg total) by mouth at bedtime.   vitamin B-12 100 MCG tablet Commonly known as: CYANOCOBALAMIN Take 1 tablet (100 mcg total) by mouth daily.   Vitamin D3 25 MCG (1000 UT) Caps Take 1 capsule (1,000 Units total) by mouth daily.      Relevant Imaging Results:  Relevant Lab Results:   Additional Information Home Health arranged for PT  Allayne Butcher, RN

## 2020-12-05 NOTE — TOC Transition Note (Signed)
Transition of Care [facility]) - CM/SW Discharge Note   Patient Details  Name: [name] MRN: [id number] Date of Birth: August 25, 1949  Transition [facility]) CM/SW Contact:  Allayne Butcher, RN Phone Number: 12/05/2020, 1:47 PM   Clinical Narrative:    Patient is medically cleared for discharge back to his group home with home health PT.  [facility] owner Timmy will be picking patient up today.  Home Health arranged with [facility] for PT, [place] with [facility] accepted referral.     Final next level of care: Home w Home Health Services Barriers to Discharge: Barriers Resolved   Patient Goals and CMS Choice Patient states their goals for this hospitalization and ongoing recovery are:: Patient is unable to state goals      Discharge Placement                Patient to be transferred to facility by: Group home owner Name of family member notified: Timmy from [facility] Patient and family notified of of transfer: 12/05/20  Discharge Plan and Services   Discharge Planning Services: CM Consult            DME Arranged: N/A DME Agency: NA       HH Arranged: PT HH Agency: [facility] Date HH Agency Contacted: 12/05/20 Time HH Agency Contacted: 1200 Representative spoke with at [facility] Agency: [place]  Social Determinants of Health (SDOH) Interventions     Readmission Risk Interventions No flowsheet data found.

## 2020-12-09 NOTE — Progress Notes (Signed)
Virtual Visit via Telephone Note  I connected with [name] on 12/12/20 at  3:00 PM EDT by telephone and verified that I am speaking with the correct person using two identifiers.  Location: Patient: group home Provider: office Persons participated in the visit- patient, provider, caregiver at [facility] home/group home   I discussed the limitations, risks, security and privacy concerns of performing an evaluation and management service by telephone and the availability of in person appointments. I also discussed with the patient that there may be a patient responsible charge related to this service. The patient expressed understanding and agreed to proceed.     I discussed the assessment and treatment plan with the patient. The patient was provided an opportunity to ask questions and all were answered. The patient agreed with the plan and demonstrated an understanding of the instructions.   The patient was advised to call back or seek an in-person evaluation if the symptoms worsen or if the condition fails to improve as anticipated.  I provided 22 minutes of non-face-to-face time during this encounter.   Neysa Hotter, MD    [facility] MD/PA/NP OP Progress Note  12/12/2020 3:24 PM [name]  MRN:  [id number]  Chief Complaint:  Chief Complaint    Follow-up; Schizophrenia     HPI:  [name] is a 70 y.o. year old male with a history of schizophrenia, hypertension, hyperlipidemia , who is transferred care from Dr. Evelene Croon.   He is relatively a poor historian, and he mostly answers with 1 word.  He states that he has been doing good.  He does not have any concerns at the group home.  When he is asked if he has any children, he answers he has "17."  Although he states that he meets with some of his children, nobody visits him lately according to the caregiver at the group home.  He has good relationships with other peers in the unit.  He denies feeling depressed.  He denies anxiety.   He denies SI, HI.  He denies paranoia.  He denies AH, VH.  He denies ideas of reference.   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  He states that [name] has been doing good.  He has been calm, and has never been violent or has any issues.  He usually sits around, watches TV, and takes a nap at times.  He has occasional insomnia.  He eats well.  He takes medication regularly.   Daily routine: sits, watches TV, takes a nap at times Exercise: Employment: unemployed, used to work in [employer], until 2013 Support: Household:  Group home Marital status: never married Number of children: "17 children"  He grew up in [place]  Visit Diagnosis: No diagnosis found.  Past Psychiatric History:  Outpatient:  Psychiatry admission: unknown Previous suicide attempt: denies  Past trials of medication:  History of violence: denies  Past Medical History:  Past Medical History:  Diagnosis Date  . Bipolar disorder (HCC)   . Depression   . Hyperlipidemia   . Hypertension   . Schizophrenia (HCC)    No past surgical history on file.  Family Psychiatric History: Please see initial evaluation for full details. I have reviewed the history. No updates at this time.     Family History:  Family History  Family history unknown: Yes    Social History:  Social History   Socioeconomic History  . Marital status: Single    Spouse name: Not on file  .  Number of children: Not on file  . Years of education: Not on file  . Highest education level: Not on file  Occupational History  . Not on file  Tobacco Use  . Smoking status: Current Every Day Smoker    Packs/day: 1.00    Types: Cigarettes  . Smokeless tobacco: Never Used  Vaping Use  . Vaping Use: Never used  Substance and Sexual Activity  . Alcohol use: No    Alcohol/week: 0.0 standard drinks  . Drug use: No  . Sexual activity: Never  Other Topics Concern  . Not on file  Social History Narrative  . Not on file   Social  Determinants of Health   Financial Resource Strain: Not on file  Food Insecurity: Not on file  Transportation Needs: Not on file  Physical Activity: Not on file  Stress: Not on file  Social Connections: Not on file    Allergies:  Allergies  Allergen Reactions  . Asa [Aspirin]     Hx GI bleed    Metabolic Disorder Labs: Lab Results  Component Value Date   HGBA1C 5.7 (H) 12/04/2020   MPG 116.89 12/04/2020   Lab Results  Component Value Date   PROLACTIN 27.8 (H) 01/14/2016   Lab Results  Component Value Date   CHOL 108 01/14/2016   TRIG 36 01/14/2016   HDL 40 01/14/2016   CHOLHDL 3.1 08/28/2015   VLDL 20 01/04/2013   LDLCALC 61 01/14/2016   LDLCALC 71 08/28/2015   Lab Results  Component Value Date   TSH 0.480 12/03/2020   TSH 0.616 01/14/2016    Therapeutic Level Labs: No results found for: LITHIUM No results found for: VALPROATE No components found for:  CBMZ  Current Medications: Current Outpatient Medications  Medication Sig Dispense Refill  . lisinopril (ZESTRIL) 20 MG tablet Take 20 mg by mouth daily.    Marland Kitchen amLODipine (NORVASC) 10 MG tablet TAKE 1 TABLET BY MOUTH DAILY 30 tablet 5  . Cholecalciferol (VITAMIN D3) 1000 units CAPS Take 1 capsule (1,000 Units total) by mouth daily. 90 capsule 3  . citalopram (CELEXA) 40 MG tablet Take 1 tablet (40 mg total) by mouth daily. 30 tablet 3  . fluPHENAZine (PROLIXIN) 5 MG tablet Take 1 tablet (5 mg total) by mouth daily. 60 tablet 3  . risperiDONE (RISPERDAL) 1 MG tablet Take 1 tablet (1 mg total) by mouth daily. 30 tablet 3  . simvastatin (ZOCOR) 20 MG tablet TAKE 1 TABLET BY MOUTH DAILY 30 tablet 5  . tamsulosin (FLOMAX) 0.4 MG CAPS capsule TAKE 1 CAPSULE BY MOUTH DAILY AFTER SUPPER 90 capsule 3  . traZODone (DESYREL) 50 MG tablet Take 1 tablet (50 mg total) by mouth at bedtime. 30 tablet 3  . vitamin B-12 (CYANOCOBALAMIN) 100 MCG tablet Take 1 tablet (100 mcg total) by mouth daily. 100 tablet 3   No current  facility-administered medications for this visit.     Musculoskeletal: Strength & Muscle Tone: N/A Gait & Station: N/A Patient leans: N/A  Psychiatric Specialty Exam: Review of Systems  Psychiatric/Behavioral: Negative for agitation, behavioral problems, confusion, decreased concentration, dysphoric mood, hallucinations, self-injury, sleep disturbance and suicidal ideas. The patient is not nervous/anxious and is not hyperactive.   All other systems reviewed and are negative.   There were no vitals taken for this visit.There is no height or weight on file to calculate BMI.  General Appearance: NA  Eye Contact:  NA  Speech:  Garbled  Volume:  Normal  Mood:  good  Affect:  NA  Thought Process:  Coherent. May have poverty of thought  Orientation:  Full (Time, Place, and Person)  Thought Content: concrete , linear  Suicidal Thoughts:  No  Homicidal Thoughts:  No  Memory:  Immediate;   Good  Judgement:  Good  Insight:  Shallow  Psychomotor Activity:  Normal  Concentration:  Concentration: Good and Attention Span: Good  Recall:  Good  Fund of Knowledge: Good  Language: Good  Akathisia:  No  Handed:  Right  AIMS (if indicated): not done  Assets:  Communication Skills Desire for Improvement  ADL's:  Intact  Cognition: WNL  Sleep:  Good   Screenings: AIMS   Flowsheet Row Office Visit from 08/19/2016 in [facility] Office Visit from 11/19/2015 in [facility]  AIMS Total Score 0 0    PHQ2-9   Flowsheet Row Office Visit from 08/28/2015 in [facility]  PHQ-2 Total Score 0    Flowsheet Row ED to Hosp-Admission (Discharged) from 12/03/2020 in [facility] ([facility])  C-SSRS RISK CATEGORY No Risk       Assessment and Plan:  Luca Dyar is a 70 y.o. year old male with a history of schizophrenia, hypertension, hyperlipidemia , who is transferred care from Dr. Evelene Croon.   1. Schizophrenia,  paranoid (HCC) Exam is notable for concrete thought process/he may have poverty of thought, although it is unclear whether it is baseline.  According to the caregiver, he has been calm, and denies any behavior issues.  Will continue current medication at this time.  Will continue fluphenazine and Risperdal to target schizophrenia.  Although it is preferable to do monotherapy, will continue both as he has been stable on this medication regimen , and this is the new encounter with this clinician .  Will monitor metabolic side effect .Will do in person visit next time to rule out any potential side effect including EPS.  Will continue citalopram to target depression/anxiety.  Will continue trazodone as needed for insomnia.   Plan 1. Continue fluphenazine 5 mg daily 2. Continue risperidone 1 mg daily 3. Continue citalopram 40 mg daily 4. Continue trazodone 50 mg at night 5. Next appointment: 8/22 at 11 AM for 30 mins, in person  (text link to Issac, who works at group home: [phone])  The patient demonstrates the following risk factors for suicide: Chronic risk factors for suicide include: psychiatric disorder of schizophrenia. Acute risk factors for suicide include: unemployment. Protective factors for this patient include: positive social support. Considering these factors, the overall suicide risk at this point appears to be low. Patient is appropriate for outpatient follow up.   Neysa Hotter, MD 12/12/2020, 3:24 PM

## 2020-12-10 ENCOUNTER — Ambulatory Visit: Payer: Medicare HMO | Admitting: Cardiology

## 2020-12-12 ENCOUNTER — Telehealth (HOSPITAL_COMMUNITY): Payer: Medicare HMO | Admitting: Psychiatry

## 2020-12-12 ENCOUNTER — Encounter: Payer: Self-pay | Admitting: Psychiatry

## 2020-12-12 ENCOUNTER — Other Ambulatory Visit: Payer: Self-pay

## 2020-12-12 ENCOUNTER — Telehealth (INDEPENDENT_AMBULATORY_CARE_PROVIDER_SITE_OTHER): Payer: Medicare HMO | Admitting: Psychiatry

## 2020-12-12 DIAGNOSIS — F200 Paranoid schizophrenia: Secondary | ICD-10-CM

## 2021-03-06 NOTE — Progress Notes (Deleted)
BH MD/PA/NP OP Progress Note  03/06/2021 5:18 PM [name]  MRN:  [id number]  Chief Complaint:  HPI: *** Visit Diagnosis: No diagnosis found.  Past Psychiatric History: Please see initial evaluation for full details. I have reviewed the history. No updates at this time.     Past Medical History:  Past Medical History:  Diagnosis Date   Bipolar disorder (HCC)    Depression    Hyperlipidemia    Hypertension    Schizophrenia (HCC)    No past surgical history on file.  Family Psychiatric History: Please see initial evaluation for full details. I have reviewed the history. No updates at this time.     Family History:  Family History  Family history unknown: Yes    Social History:  Social History   Socioeconomic History   Marital status: Single    Spouse name: Not on file   Number of children: Not on file   Years of education: Not on file   Highest education level: Not on file  Occupational History   Not on file  Tobacco Use   Smoking status: Every Day    Packs/day: 1.00    Types: Cigarettes   Smokeless tobacco: Never  Vaping Use   Vaping Use: Never used  Substance and Sexual Activity   Alcohol use: No    Alcohol/week: 0.0 standard drinks   Drug use: No   Sexual activity: Never  Other Topics Concern   Not on file  Social History Narrative   Not on file   Social Determinants of Health   Financial Resource Strain: Not on file  Food Insecurity: Not on file  Transportation Needs: Not on file  Physical Activity: Not on file  Stress: Not on file  Social Connections: Not on file    Allergies:  Allergies  Allergen Reactions   Asa [Aspirin]     Hx GI bleed    Metabolic Disorder Labs: Lab Results  Component Value Date   HGBA1C 5.7 (H) 12/04/2020   MPG 116.89 12/04/2020   Lab Results  Component Value Date   PROLACTIN 27.8 (H) 01/14/2016   Lab Results  Component Value Date   CHOL 108 01/14/2016   TRIG 36 01/14/2016   HDL 40 01/14/2016    CHOLHDL 3.1 08/28/2015   VLDL 20 01/04/2013   LDLCALC 61 01/14/2016   LDLCALC 71 08/28/2015   Lab Results  Component Value Date   TSH 0.480 12/03/2020   TSH 0.616 01/14/2016    Therapeutic Level Labs: No results found for: LITHIUM No results found for: VALPROATE No components found for:  CBMZ  Current Medications: Current Outpatient Medications  Medication Sig Dispense Refill   amLODipine (NORVASC) 10 MG tablet TAKE 1 TABLET BY MOUTH DAILY 30 tablet 5   Cholecalciferol (VITAMIN D3) 1000 units CAPS Take 1 capsule (1,000 Units total) by mouth daily. 90 capsule 3   citalopram (CELEXA) 40 MG tablet Take 1 tablet (40 mg total) by mouth daily. 30 tablet 3   fluPHENAZine (PROLIXIN) 5 MG tablet Take 1 tablet (5 mg total) by mouth daily. 60 tablet 3   lisinopril (ZESTRIL) 20 MG tablet Take 20 mg by mouth daily.     risperiDONE (RISPERDAL) 1 MG tablet Take 1 tablet (1 mg total) by mouth daily. 30 tablet 3   simvastatin (ZOCOR) 20 MG tablet TAKE 1 TABLET BY MOUTH DAILY 30 tablet 5   tamsulosin (FLOMAX) 0.4 MG CAPS capsule TAKE 1 CAPSULE BY MOUTH DAILY AFTER SUPPER 90 capsule 3  traZODone (DESYREL) 50 MG tablet Take 1 tablet (50 mg total) by mouth at bedtime. 30 tablet 3   vitamin B-12 (CYANOCOBALAMIN) 100 MCG tablet Take 1 tablet (100 mcg total) by mouth daily. 100 tablet 3   No current facility-administered medications for this visit.     Musculoskeletal: Strength & Muscle Tone:  N/A Gait & Station:  N/A Patient leans: N/A  Psychiatric Specialty Exam: Review of Systems  There were no vitals taken for this visit.There is no height or weight on file to calculate BMI.  General Appearance: {Appearance:22683}  Eye Contact:  {BHH EYE CONTACT:22684}  Speech:  Clear and Coherent  Volume:  Normal  Mood:  {BHH MOOD:22306}  Affect:  {Affect (PAA):22687}  Thought Process:  Coherent  Orientation:  Full (Time, Place, and Person)  Thought Content: Logical   Suicidal Thoughts:  {ST/HT  (PAA):22692}  Homicidal Thoughts:  {ST/HT (PAA):22692}  Memory:  Immediate;   Good  Judgement:  {Judgement (PAA):22694}  Insight:  {Insight (PAA):22695}  Psychomotor Activity:  Normal  Concentration:  Concentration: Good and Attention Span: Good  Recall:  Good  Fund of Knowledge: Good  Language: Good  Akathisia:  No  Handed:  Right  AIMS (if indicated): not done  Assets:  Communication Skills Desire for Improvement  ADL's:  Intact  Cognition: WNL  Sleep:  {BHH GOOD/FAIR/POOR:22877}   Screenings: AIMS    Flowsheet Row [facility] in [facility] Office Visit from 11/19/2015 in [facility]  AIMS Total Score 0 0      PHQ2-9    Flowsheet Row Video Visit from 12/12/2020 in [facility] Office Visit from 08/28/2015 in [facility]  PHQ-2 Total Score 0 0      Flowsheet Row Video Visit from 12/12/2020 in [facility] ED to Hosp-Admission (Discharged) from 12/03/2020 in [facility] ([facility]C)  C-SSRS RISK CATEGORY No Risk No Risk        Assessment and Plan:  [name] is a 70 y.o. year old male with a history of schizophrenia, hypertension, hyperlipidemia , who presents for follow up appointment for below.   1. Schizophrenia, paranoid (HCC) Exam is notable for concrete thought process/he may have poverty of thought, although it is unclear whether it is baseline.  According to the caregiver, he has been calm, and denies any behavior issues.  Will continue current medication at this time.  Will continue fluphenazine and Risperdal to target schizophrenia.  Although it is preferable to do monotherapy, will continue both as he has been stable on this medication regimen , and this is the new encounter with this clinician .  Will monitor metabolic side effect .Will do in person visit next time to rule out any potential side effect  including EPS.  Will continue citalopram to target depression/anxiety.  Will continue trazodone as needed for insomnia.    Plan 1. Continue fluphenazine 5 mg daily 2. Continue risperidone 1 mg daily 3. Continue citalopram 40 mg daily 4. Continue trazodone 50 mg at night 5. Next appointment: 8/22 at 11 AM for 30 mins, in person  (text link to Issac, who works at group home: [phone])   The patient demonstrates the following risk factors for suicide: Chronic risk factors for suicide include: psychiatric disorder of schizophrenia. Acute risk factors for suicide include: unemployment. Protective factors for this patient include: positive social support. Considering these factors, the overall suicide risk at this point appears to be low.  Patient is appropriate for outpatient follow up.        Neysa Hotter, MD 03/06/2021, 5:18 PM

## 2021-03-10 ENCOUNTER — Ambulatory Visit: Payer: Medicare HMO | Admitting: Psychiatry

## 2021-04-02 ENCOUNTER — Inpatient Hospital Stay
Admission: EM | Admit: 2021-04-02 | Discharge: 2021-04-14 | DRG: 178 | Disposition: A | Discharge status: Home Health | Payer: Medicare HMO | Attending: Internal Medicine | Admitting: Internal Medicine

## 2021-04-02 ENCOUNTER — Other Ambulatory Visit: Payer: Self-pay

## 2021-04-02 ENCOUNTER — Emergency Department: Payer: Medicare HMO

## 2021-04-02 DIAGNOSIS — Z79899 Other long term (current) drug therapy: Secondary | ICD-10-CM

## 2021-04-02 DIAGNOSIS — R531 Weakness: Secondary | ICD-10-CM | POA: Diagnosis not present

## 2021-04-02 DIAGNOSIS — U071 COVID-19: Secondary | ICD-10-CM | POA: Diagnosis not present

## 2021-04-02 DIAGNOSIS — Z8673 Personal history of transient ischemic attack (TIA), and cerebral infarction without residual deficits: Secondary | ICD-10-CM

## 2021-04-02 DIAGNOSIS — J208 Acute bronchitis due to other specified organisms: Secondary | ICD-10-CM | POA: Diagnosis present

## 2021-04-02 DIAGNOSIS — I10 Essential (primary) hypertension: Secondary | ICD-10-CM | POA: Diagnosis present

## 2021-04-02 DIAGNOSIS — E871 Hypo-osmolality and hyponatremia: Secondary | ICD-10-CM

## 2021-04-02 DIAGNOSIS — N400 Enlarged prostate without lower urinary tract symptoms: Secondary | ICD-10-CM | POA: Diagnosis present

## 2021-04-02 DIAGNOSIS — R2681 Unsteadiness on feet: Secondary | ICD-10-CM

## 2021-04-02 DIAGNOSIS — K219 Gastro-esophageal reflux disease without esophagitis: Secondary | ICD-10-CM | POA: Diagnosis present

## 2021-04-02 DIAGNOSIS — D696 Thrombocytopenia, unspecified: Secondary | ICD-10-CM | POA: Diagnosis not present

## 2021-04-02 DIAGNOSIS — R7989 Other specified abnormal findings of blood chemistry: Secondary | ICD-10-CM

## 2021-04-02 DIAGNOSIS — E559 Vitamin D deficiency, unspecified: Secondary | ICD-10-CM | POA: Diagnosis present

## 2021-04-02 DIAGNOSIS — F209 Schizophrenia, unspecified: Secondary | ICD-10-CM

## 2021-04-02 DIAGNOSIS — E785 Hyperlipidemia, unspecified: Secondary | ICD-10-CM | POA: Diagnosis present

## 2021-04-02 DIAGNOSIS — E875 Hyperkalemia: Secondary | ICD-10-CM | POA: Diagnosis present

## 2021-04-02 DIAGNOSIS — Z886 Allergy status to analgesic agent status: Secondary | ICD-10-CM

## 2021-04-02 DIAGNOSIS — F32A Depression, unspecified: Secondary | ICD-10-CM | POA: Diagnosis not present

## 2021-04-02 DIAGNOSIS — F200 Paranoid schizophrenia: Secondary | ICD-10-CM | POA: Diagnosis present

## 2021-04-02 DIAGNOSIS — F17210 Nicotine dependence, cigarettes, uncomplicated: Secondary | ICD-10-CM | POA: Diagnosis present

## 2021-04-02 DIAGNOSIS — D6959 Other secondary thrombocytopenia: Secondary | ICD-10-CM | POA: Diagnosis present

## 2021-04-02 DIAGNOSIS — R062 Wheezing: Secondary | ICD-10-CM

## 2021-04-02 DIAGNOSIS — R410 Disorientation, unspecified: Secondary | ICD-10-CM | POA: Diagnosis not present

## 2021-04-02 LAB — CBC WITH DIFFERENTIAL/PLATELET
Abs Immature Granulocytes: 0.03 10*3/uL (ref 0.00–0.07)
Basophils Absolute: 0 10*3/uL (ref 0.0–0.1)
Basophils Relative: 0 %
Eosinophils Absolute: 0 10*3/uL (ref 0.0–0.5)
Eosinophils Relative: 0 %
HCT: 42.9 % (ref 39.0–52.0)
Hemoglobin: 14.7 g/dL (ref 13.0–17.0)
Immature Granulocytes: 0 %
Lymphocytes Relative: 3 %
Lymphs Abs: 0.3 10*3/uL — ABNORMAL LOW (ref 0.7–4.0)
MCH: 31.1 pg (ref 26.0–34.0)
MCHC: 34.3 g/dL (ref 30.0–36.0)
MCV: 90.9 fL (ref 80.0–100.0)
Monocytes Absolute: 0.8 10*3/uL (ref 0.1–1.0)
Monocytes Relative: 8 %
Neutro Abs: 8.6 10*3/uL — ABNORMAL HIGH (ref 1.7–7.7)
Neutrophils Relative %: 89 %
Platelets: 113 10*3/uL — ABNORMAL LOW (ref 150–400)
RBC: 4.72 MIL/uL (ref 4.22–5.81)
RDW: 14 % (ref 11.5–15.5)
WBC: 9.8 10*3/uL (ref 4.0–10.5)
nRBC: 0 % (ref 0.0–0.2)

## 2021-04-02 LAB — COMPREHENSIVE METABOLIC PANEL
ALT: 19 U/L (ref 0–44)
AST: 74 U/L — ABNORMAL HIGH (ref 15–41)
Albumin: 3.8 g/dL (ref 3.5–5.0)
Alkaline Phosphatase: 70 U/L (ref 38–126)
Anion gap: 9 (ref 5–15)
BUN: 14 mg/dL (ref 8–23)
CO2: 25 mmol/L (ref 22–32)
Calcium: 9.3 mg/dL (ref 8.9–10.3)
Chloride: 102 mmol/L (ref 98–111)
Creatinine, Ser: 1.09 mg/dL (ref 0.61–1.24)
GFR, Estimated: >60 mL/min (ref >60)
Glucose, Bld: 156 mg/dL — ABNORMAL HIGH (ref 70–99)
Potassium: 3.9 mmol/L (ref 3.5–5.1)
Sodium: 136 mmol/L (ref 135–145)
Total Bilirubin: 0.8 mg/dL (ref 0.3–1.2)
Total Protein: 7.6 g/dL (ref 6.5–8.1)

## 2021-04-02 LAB — CBC
HCT: 41.6 % (ref 39.0–52.0)
Hemoglobin: 14.3 g/dL (ref 13.0–17.0)
MCH: 31.4 pg (ref 26.0–34.0)
MCHC: 34.4 g/dL (ref 30.0–36.0)
MCV: 91.4 fL (ref 80.0–100.0)
Platelets: 106 10*3/uL — ABNORMAL LOW (ref 150–400)
RBC: 4.55 MIL/uL (ref 4.22–5.81)
RDW: 14.1 % (ref 11.5–15.5)
WBC: 9.2 10*3/uL (ref 4.0–10.5)
nRBC: 0 % (ref 0.0–0.2)

## 2021-04-02 LAB — URINALYSIS, COMPLETE (UACMP) WITH MICROSCOPIC
Bacteria, UA: NONE SEEN
Bilirubin Urine: NEGATIVE
Glucose, UA: NEGATIVE mg/dL
Ketones, ur: NEGATIVE mg/dL
Leukocytes,Ua: NEGATIVE
Nitrite: NEGATIVE
Protein, ur: 100 mg/dL — AB
Specific Gravity, Urine: 1.018 (ref 1.005–1.030)
pH: 6 (ref 5.0–8.0)

## 2021-04-02 LAB — LACTIC ACID, PLASMA: Lactic Acid, Venous: 1.5 mmol/L (ref 0.5–1.9)

## 2021-04-02 LAB — CREATININE, SERUM
Creatinine, Ser: 1.02 mg/dL (ref 0.61–1.24)
GFR, Estimated: >60 mL/min (ref >60)

## 2021-04-02 LAB — RESP PANEL BY RT-PCR (FLU A&B, COVID) ARPGX2
Influenza A by PCR: NEGATIVE
Influenza B by PCR: NEGATIVE
SARS Coronavirus 2 by RT PCR: POSITIVE — AB

## 2021-04-02 LAB — PROTIME-INR
INR: 1.1 (ref 0.8–1.2)
Prothrombin Time: 14.3 seconds (ref 11.4–15.2)

## 2021-04-02 IMAGING — CR DG CHEST 2V
1 series · 3 of 3 positions shown · non-contrast
Comparison: Chest x-ray dated [DATE].

CLINICAL DATA: Sepsis.

EXAM:
CHEST - 2 VIEW

[Series 1: w chest lat · 0.14mm/px · 3 of 3 slices shown]
[im 1/3]
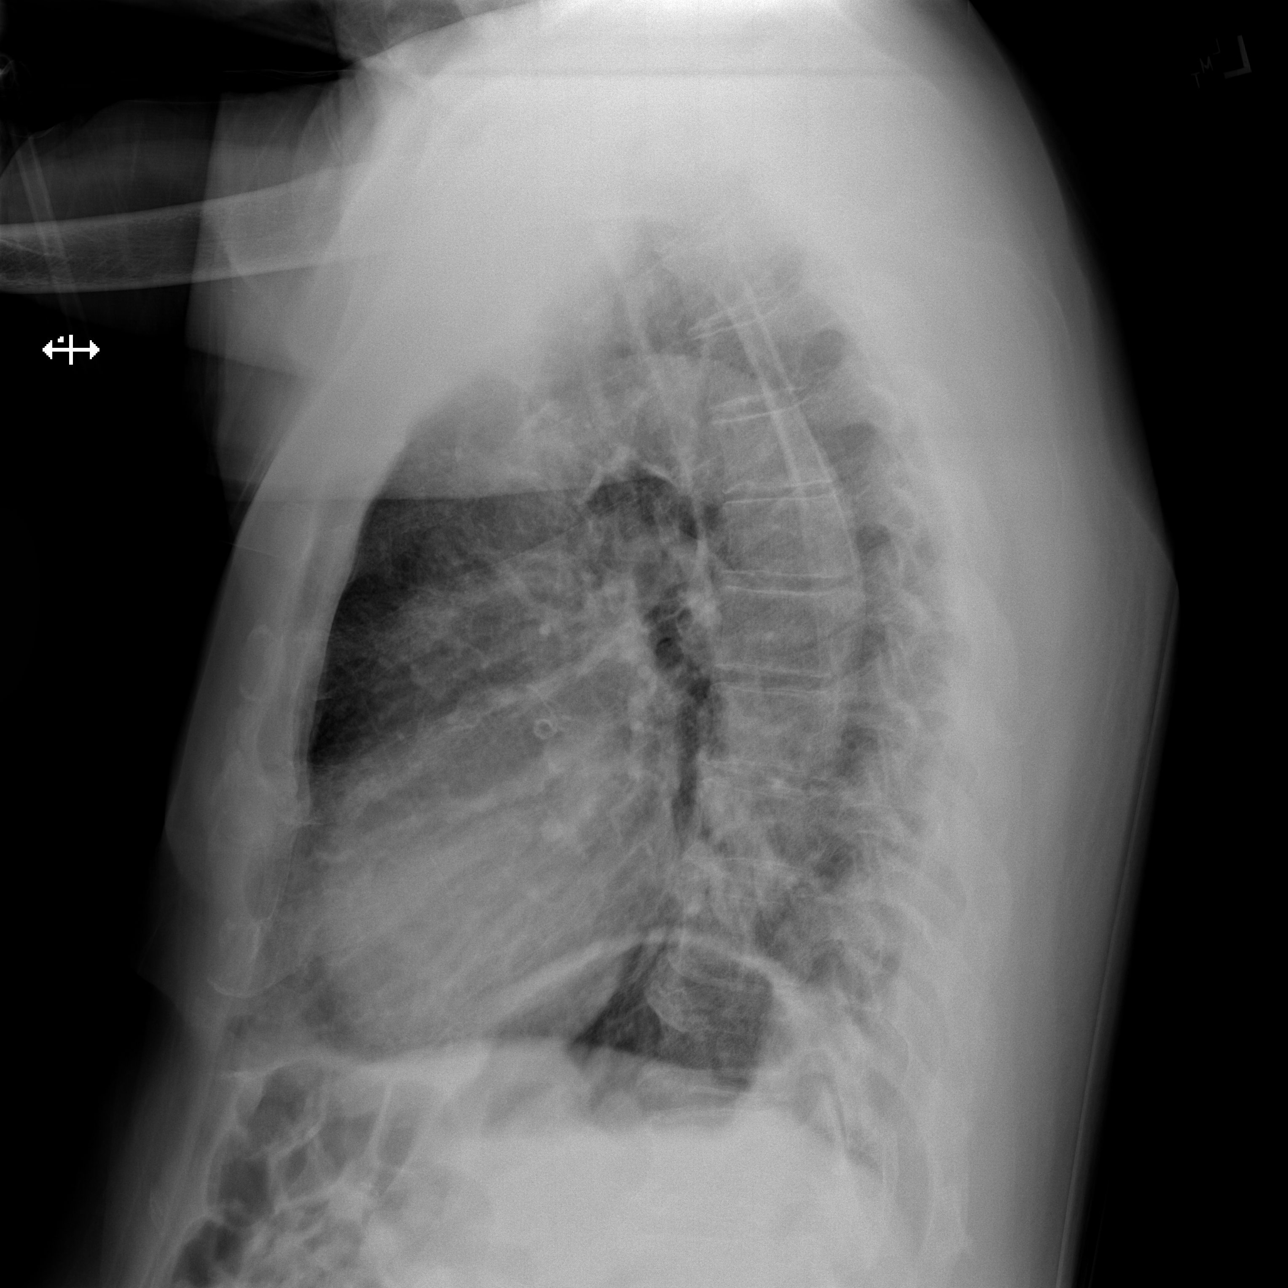
[im 2/3]
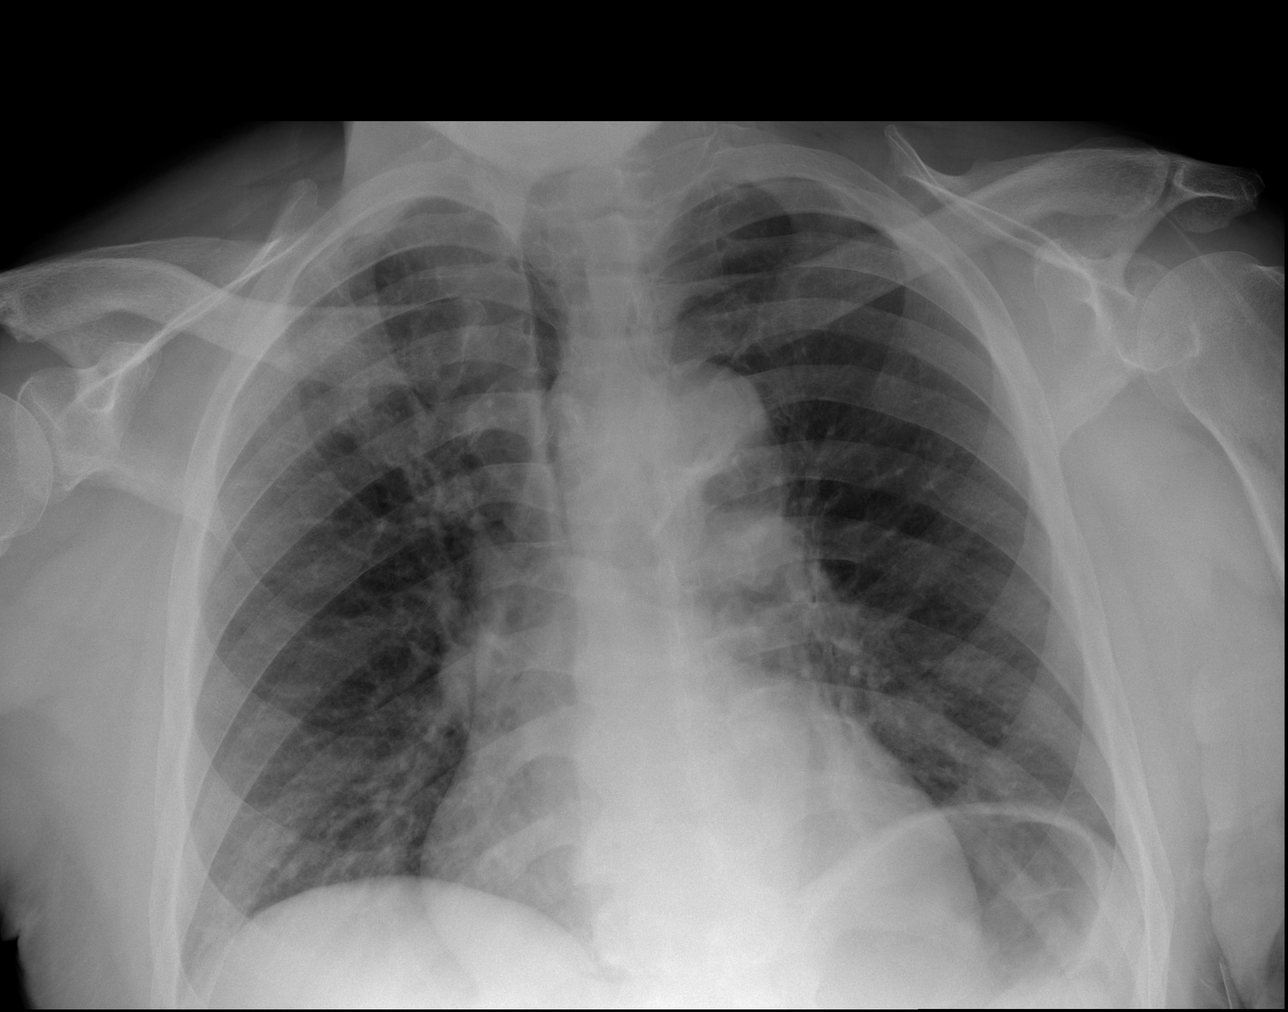
[im 3/3]
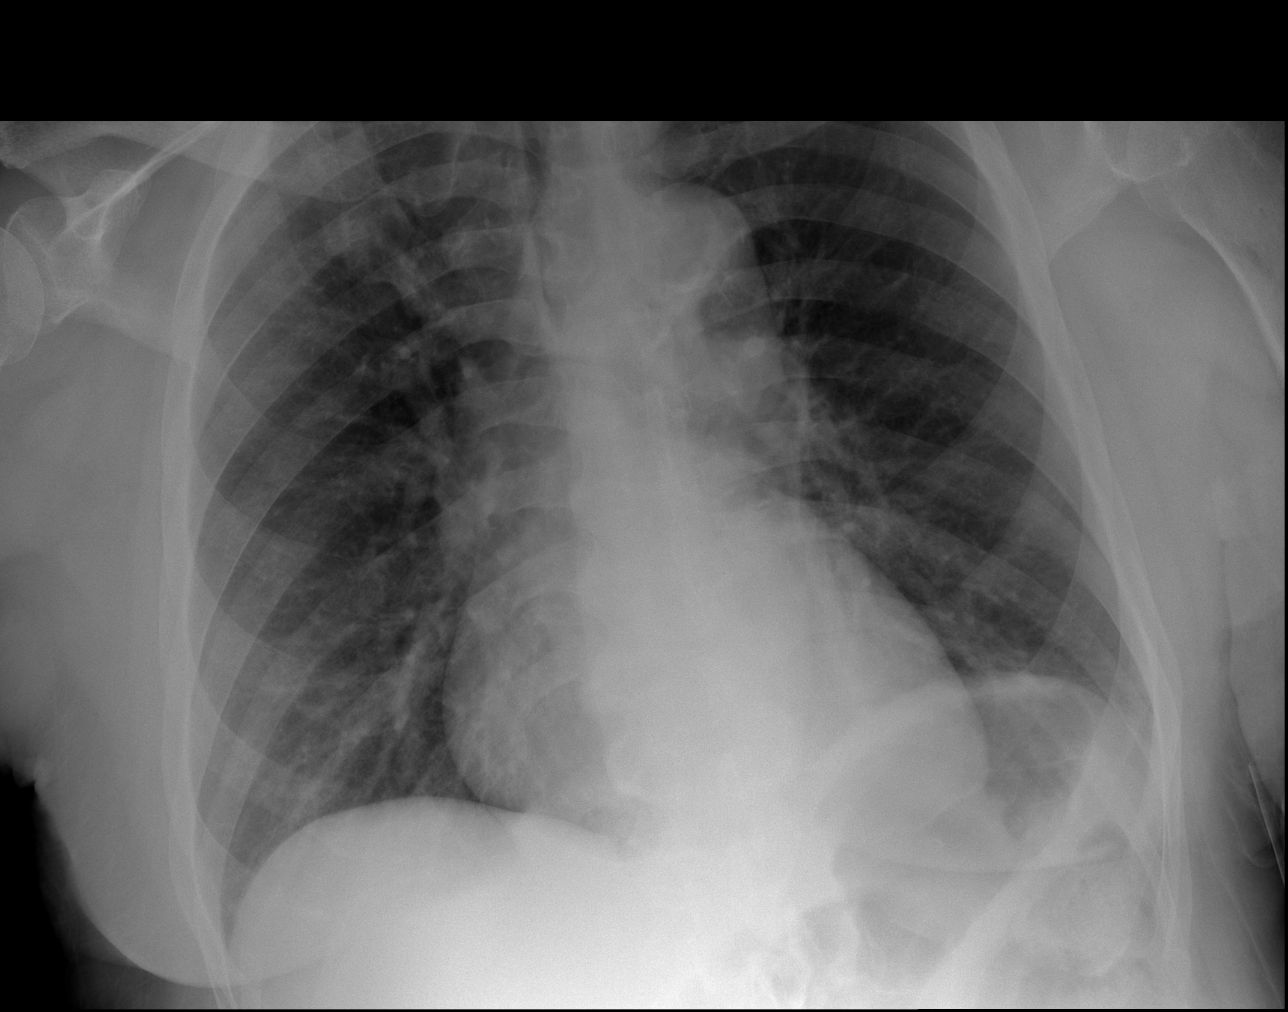

[3 of 3 positions shown; findings below may reference images not displayed]

FINDINGS: The heart size and mediastinal contours are within normal limits.
Normal pulmonary vascularity. Mild bibasilar atelectasis. No focal
consolidation, pleural effusion, or pneumothorax. No acute osseous
abnormality.
IMPRESSION: 1. Mild bibasilar atelectasis.

## 2021-04-02 MED ORDER — ZINC SULFATE 220 (50 ZN) MG PO CAPS
220.0000 mg | ORAL_CAPSULE | Freq: Every day | ORAL | Status: DC
  Administered 2021-04-02 – 2021-04-14 (×13): 220 mg via ORAL
  Filled 2021-04-02 (×13): qty 1

## 2021-04-02 MED ORDER — TRAZODONE HCL 50 MG PO TABS: 50.0000 mg | ORAL_TABLET | Freq: Every day | ORAL | Status: DC

## 2021-04-02 MED ORDER — RISPERIDONE 1 MG PO TABS
1.0000 mg | ORAL_TABLET | Freq: Every day | ORAL | Status: DC
  Administered 2021-04-03: 1 mg via ORAL
  Filled 2021-04-02: qty 1

## 2021-04-02 MED ORDER — ASCORBIC ACID 500 MG PO TABS
500.0000 mg | ORAL_TABLET | Freq: Every day | ORAL | Status: DC
  Administered 2021-04-02 – 2021-04-14 (×13): 500 mg via ORAL
  Filled 2021-04-02 (×13): qty 1

## 2021-04-02 MED ORDER — TAMSULOSIN HCL 0.4 MG PO CAPS
0.4000 mg | ORAL_CAPSULE | Freq: Every day | ORAL | Status: DC
  Administered 2021-04-03 – 2021-04-14 (×12): 0.4 mg via ORAL
  Filled 2021-04-02 (×12): qty 1

## 2021-04-02 MED ORDER — CITALOPRAM HYDROBROMIDE 20 MG PO TABS
40.0000 mg | ORAL_TABLET | Freq: Every day | ORAL | Status: DC
  Administered 2021-04-03 – 2021-04-14 (×12): 40 mg via ORAL
  Filled 2021-04-02 (×12): qty 2

## 2021-04-02 MED ORDER — ACETAMINOPHEN 325 MG PO TABS: 650.0000 mg | ORAL_TABLET | Freq: Four times a day (QID) | ORAL | Status: DC | PRN

## 2021-04-02 MED ORDER — VITAMIN D 25 MCG (1000 UNIT) PO TABS
1000.0000 [IU] | ORAL_TABLET | Freq: Every day | ORAL | Status: DC
  Administered 2021-04-03 – 2021-04-14 (×12): 1000 [IU] via ORAL
  Filled 2021-04-02 (×14): qty 1

## 2021-04-02 MED ORDER — ADULT MULTIVITAMIN W/MINERALS CH
1.0000 | ORAL_TABLET | Freq: Every day | ORAL | Status: DC
  Administered 2021-04-02 – 2021-04-14 (×13): 1 via ORAL
  Filled 2021-04-02 (×13): qty 1

## 2021-04-02 MED ORDER — ENOXAPARIN SODIUM 40 MG/0.4ML IJ SOSY
40.0000 mg | PREFILLED_SYRINGE | INTRAMUSCULAR | Status: DC
  Administered 2021-04-02 – 2021-04-13 (×12): 40 mg via SUBCUTANEOUS
  Filled 2021-04-02 (×12): qty 0.4

## 2021-04-02 MED ORDER — POLYETHYLENE GLYCOL 3350 17 G PO PACK
17.0000 g | PACK | Freq: Every day | ORAL | Status: DC | PRN
  Administered 2021-04-12: 17 g via ORAL
  Filled 2021-04-02: qty 1

## 2021-04-02 MED ORDER — AMLODIPINE BESYLATE 5 MG PO TABS
10.0000 mg | ORAL_TABLET | Freq: Every day | ORAL | Status: DC
  Administered 2021-04-03: 10 mg via ORAL
  Filled 2021-04-02: qty 2

## 2021-04-02 MED ORDER — SODIUM CHLORIDE 0.9 % IV BOLUS
1000.0000 mL | Freq: Once | INTRAVENOUS | Status: AC
  Administered 2021-04-02: 1000 mL via INTRAVENOUS

## 2021-04-02 MED ORDER — ONDANSETRON HCL 4 MG/2ML IJ SOLN
4.0000 mg | Freq: Four times a day (QID) | INTRAMUSCULAR | Status: DC | PRN
  Filled 2021-04-02: qty 2

## 2021-04-02 MED ORDER — MELATONIN 5 MG PO TABS
5.0000 mg | ORAL_TABLET | Freq: Every evening | ORAL | Status: DC | PRN
  Filled 2021-04-02: qty 1

## 2021-04-02 MED ORDER — FLUPHENAZINE HCL 5 MG PO TABS
5.0000 mg | ORAL_TABLET | Freq: Every day | ORAL | Status: DC
  Administered 2021-04-03 – 2021-04-14 (×12): 5 mg via ORAL
  Filled 2021-04-02 (×12): qty 1

## 2021-04-02 NOTE — ED Triage Notes (Addendum)
First nurse note: Pt to ED from [facility], c/o back pain. Caregiver called for decreased mobility.  Reports exposure to COVID    DISCHARGE PHONE NUMBER [phone]

## 2021-04-02 NOTE — ED Provider Notes (Signed)
[facility] Emergency Department Provider Note  Time seen: 4:21 PM  I have reviewed the triage vital signs and the nursing notes.   HISTORY  Chief Complaint Back Pain and covid sx   HPI [name] is a 70 y.o. male with a past medical history of bipolar, depression, hypertension, hyperlipidemia, schizophrenia, presents from his group facility for lower back pain, generalized weakness and has recently been exposed to a COVID-positive patient.  Patient found to be febrile to 100.2 in the emergency department.  Patient is not able to provide any history or review of systems questioning.  Will answer questions but is slow to respond and answers appear to be inaccurate at times, for instance states he lives alone.   Past Medical History:  Diagnosis Date   Bipolar disorder (HCC)    Depression    Hyperlipidemia    Hypertension    Schizophrenia (HCC)     Patient Active Problem List   Diagnosis Date Noted   Syncope 12/03/2020   Vitamin D deficiency 08/29/2015   Prediabetes 08/29/2015   Hypertension 08/28/2015   GERD (gastroesophageal reflux disease) 08/28/2015   Hyperlipidemia 08/28/2015   BPH (benign prostatic hyperplasia) 08/28/2015   Hx of sleep apnea 08/28/2015   COPD (chronic obstructive pulmonary disease) (HCC) 08/28/2015   Smoker 08/28/2015   Hx of lower gastrointestinal bleeding 08/28/2015   Hx of gastritis 08/28/2015   Bipolar 1 disorder (HCC) 08/28/2015   Schizophrenia, paranoid (HCC) 08/28/2015    No past surgical history on file.  Prior to Admission medications   Medication Sig Start Date End Date Taking? Authorizing Provider  amLODipine (NORVASC) 10 MG tablet TAKE 1 TABLET BY MOUTH DAILY 05/31/17   Plonk, Chrissie Noa, MD  Cholecalciferol (VITAMIN D3) 1000 units CAPS Take 1 capsule (1,000 Units total) by mouth daily. 08/05/17   Plonk, Chrissie Noa, MD  citalopram (CELEXA) 40 MG tablet Take 1 tablet (40 mg total) by mouth daily. 09/18/20   Zena Amos, MD  fluPHENAZine (PROLIXIN) 5 MG tablet Take 1 tablet (5 mg total) by mouth daily. 09/18/20   Zena Amos, MD  lisinopril (ZESTRIL) 20 MG tablet Take 20 mg by mouth daily.    [provider]  risperiDONE (RISPERDAL) 1 MG tablet Take 1 tablet (1 mg total) by mouth daily. 09/18/20   Zena Amos, MD  simvastatin (ZOCOR) 20 MG tablet TAKE 1 TABLET BY MOUTH DAILY 05/31/17   Plonk, Chrissie Noa, MD  tamsulosin (FLOMAX) 0.4 MG CAPS capsule TAKE 1 CAPSULE BY MOUTH DAILY AFTER SUPPER 12/11/16   Plonk, Chrissie Noa, MD  traZODone (DESYREL) 50 MG tablet Take 1 tablet (50 mg total) by mouth at bedtime. 09/18/20   Zena Amos, MD  vitamin B-12 (CYANOCOBALAMIN) 100 MCG tablet Take 1 tablet (100 mcg total) by mouth daily. 12/05/20 12/05/21  Regalado, Jon Billings A, MD    Allergies  Allergen Reactions   Asa [Aspirin]     Hx GI bleed    Family History  Family history unknown: Yes    Social History Social History   Tobacco Use   Smoking status: Every Day    Packs/day: 1.00    Types: Cigarettes   Smokeless tobacco: Never  Vaping Use   Vaping Use: Never used  Substance Use Topics   Alcohol use: No    Alcohol/week: 0.0 standard drinks   Drug use: No    Review of Systems Constitutional: Patient denies fever although found to be febrile to 100.2 in the emergency department. Cardiovascular: Negative for chest pain. Respiratory:  Negative for shortness of breath. Gastrointestinal: Negative for abdominal pain Genitourinary: Negative for urinary compaints Musculoskeletal: Lower back pain, unable to quantify or qualify. All other ROS negative, likely significantly limited due to baseline mental status first confusion.  ____________________________________________   PHYSICAL EXAM:  VITAL SIGNS: ED Triage Vitals  Enc Vitals Group     BP 04/02/21 1410 129/77     Pulse Rate 04/02/21 1410 (!) 121     Resp 04/02/21 1410 20     Temp 04/02/21 1410 100.2 F (37.9 C)     Temp Source 04/02/21 1410  Oral     SpO2 04/02/21 1410 93 %     Weight 04/02/21 1411 198 lb 6.6 oz (90 kg)     Height 04/02/21 1411 5\' 9"  (1.753 m)     Head Circumference --      Peak Flow --      Pain Score --      Pain Loc --      Pain Edu? --      Excl. in GC? --    Constitutional: Patient is awake, alert.  No acute distress.  Patient answers questions but is very slow to respond and answers appear to be inaccurate at times. Eyes: Normal exam ENT      Head: Normocephalic and atraumatic.      Mouth/Throat: Mucous membranes are moist. Cardiovascular: Normal rate, regular rhythm.  Respiratory: Normal respiratory effort without tachypnea nor retractions. Breath sounds are clear Gastrointestinal: Soft and nontender. No distention.   Musculoskeletal: Nontender with normal range of motion in all extremities. Neurologic: Slow responses but appears abnormal language.  Moves all extremities. Skin:  Skin is warm, dry  Psychiatric: Very flat affect.  ____________________________________________    EKG  EKG viewed and interpreted by myself shows what appears to be a sinus rhythm at 95 bpm with a widened QRS, left axis deviation.  Nonspecific ST changes.  ____________________________________________    RADIOLOGY  Chest x-ray is negative  ____________________________________________   INITIAL IMPRESSION / ASSESSMENT AND PLAN / ED COURSE  Pertinent labs & imaging results that were available during my care of the patient were reviewed by me and considered in my medical decision making (see chart for details).   Patient presents emergency department for generalized weakness, back pain, recent COVID exposure not febrile to 100.2.  Patient's basic work-up is largely nonrevealing.  Lab work is reassuring.  COVID is negative.  We will check a COVID swab.  We will continue to closely monitor.  Blood cultures have been sent.  Lactate is 1.5.  Patient has tested positive for COVID likely explain the patient's  significant weakness today as well as possible confusion.  Given the patient's weakness and that he lives at a group facility would not be able to care for him in his current state we will admit to the hospital service for further work-up and treatment.  [name] was evaluated in Emergency Department on 04/02/2021 for the symptoms described in the history of present illness. He was evaluated in the context of the global COVID-19 pandemic, which necessitated consideration that the patient might be at risk for infection with the SARS-CoV-2 virus that causes COVID-19. Institutional protocols and algorithms that pertain to the evaluation of patients at risk for COVID-19 are in a state of rapid change based on information released by regulatory bodies including the CDC and federal and state organizations. These policies and algorithms were followed during the patient's care in the ED.  ____________________________________________  FINAL CLINICAL IMPRESSION(S) / ED DIAGNOSES  Generalized weakness   Minna Antis, MD 04/02/21 1745

## 2021-04-02 NOTE — H&P (Signed)
History and Physical  [name] LOV:[id number] DOB: 23-May-1950 DOA: 04/02/2021  Referring physician: Dr. Lenard Lance, EDP  PCP: Koren Bound, NP  Outpatient Specialists: Cardiology, psychiatry. Patient coming from: Group home  Chief Complaint:  Generalized weakness   HPI: [name] is a 70 y.o. male with medical history significant for bipolar disorder, paranoid schizophrenia, hypertension, hyperlipidemia, who presented to [facility] ED from group home due to generalized weakness noted on the day of presentation associated with inability to walk and diffuse body aches mostly in his lower back.  Someone tested positive at the group home for COVID-19, 2 days ago.  Screening test for COVID-19 positive on 04/02/21 in the ED.  Chest x-ray showing mild bibasilar atelectasis.  Not hypoxic O2 saturation 95% on room air.  Group home will not take him back if he is too weak or unable to ambulate.  EDP requested admission.  ED Course: Temperature 100.2.  BP 167/94, pulse 101, respiration rate 18, O2 saturation 95% on room air.  Platelet count 113.  WBC 9.8.  Creatinine 1.09 with GFR greater than 60.  AST 74.  Review of Systems: Review of systems as noted in the HPI. All other systems reviewed and are negative.   Past Medical History:  Diagnosis Date   Bipolar disorder (HCC)    Depression    Hyperlipidemia    Hypertension    Schizophrenia (HCC)    No past surgical history on file.  Social History:  reports that he has been smoking cigarettes. He has been smoking an average of 1 pack per day. He has never used smokeless tobacco. He reports that he does not drink alcohol and does not use drugs.   Allergies  Allergen Reactions   Asa [Aspirin]     Hx GI bleed    Family History  Family history unknown: Yes    Unable to obtain a family history due to altered mental status.  Prior to Admission medications   Medication Sig Start Date End Date Taking? Authorizing Provider  amLODipine  (NORVASC) 10 MG tablet TAKE 1 TABLET BY MOUTH DAILY 05/31/17   Plonk, Chrissie Noa, MD  Cholecalciferol (VITAMIN D3) 1000 units CAPS Take 1 capsule (1,000 Units total) by mouth daily. 08/05/17   Plonk, Chrissie Noa, MD  citalopram (CELEXA) 40 MG tablet Take 1 tablet (40 mg total) by mouth daily. 09/18/20   Zena Amos, MD  fluPHENAZine (PROLIXIN) 5 MG tablet Take 1 tablet (5 mg total) by mouth daily. 09/18/20   Zena Amos, MD  lisinopril (ZESTRIL) 20 MG tablet Take 20 mg by mouth daily.    [provider]  risperiDONE (RISPERDAL) 1 MG tablet Take 1 tablet (1 mg total) by mouth daily. 09/18/20   Zena Amos, MD  simvastatin (ZOCOR) 20 MG tablet TAKE 1 TABLET BY MOUTH DAILY 05/31/17   Plonk, Chrissie Noa, MD  tamsulosin (FLOMAX) 0.4 MG CAPS capsule TAKE 1 CAPSULE BY MOUTH DAILY AFTER SUPPER 12/11/16   Plonk, Chrissie Noa, MD  traZODone (DESYREL) 50 MG tablet Take 1 tablet (50 mg total) by mouth at bedtime. 09/18/20   Zena Amos, MD  vitamin B-12 (CYANOCOBALAMIN) 100 MCG tablet Take 1 tablet (100 mcg total) by mouth daily. 12/05/20 12/05/21  Alba Cory, MD    Physical Exam: BP (!) 167/94   Pulse (!) 101   Temp 100.2 F (37.9 C) (Oral)   Resp 13   Ht 5\' 9"  (1.753 m)   Wt 90 kg   SpO2 95%   BMI 29.30 kg/m   General:  70 y.o. year-old male well developed well nourished in no acute distress.  Alert and oriented x 1. Cardiovascular: Regular rate and rhythm with no rubs or gallops.  No thyromegaly or JVD noted.  Trace lower extremity edema.  Respiratory: Clear to auscultation with no wheezes or rales. Poor  inspiratory effort. Abdomen: Soft nontender nondistended with normal bowel sounds x4 quadrants. Muskuloskeletal: No cyanosis or clubbing.  Trace lower extremity edema noted bilaterally. Neuro: CN II-XII intact, strength, sensation, reflexes Skin: No ulcerative lesions noted or rashes Psychiatry: Judgement and insight appear altered. Mood is appropriate for condition and setting.          Labs  on Admission:  Basic Metabolic Panel: Recent Labs  Lab 04/02/21 1415  NA 136  K 3.9  CL 102  CO2 25  GLUCOSE 156*  BUN 14  CREATININE 1.09  CALCIUM 9.3   Liver Function Tests: Recent Labs  Lab 04/02/21 1415  AST 74*  ALT 19  ALKPHOS 70  BILITOT 0.8  PROT 7.6  ALBUMIN 3.8   No results for input(s): LIPASE, AMYLASE in the last 168 hours. No results for input(s): AMMONIA in the last 168 hours. CBC: Recent Labs  Lab 04/02/21 1415  WBC 9.8  NEUTROABS 8.6*  HGB 14.7  HCT 42.9  MCV 90.9  PLT 113*   Cardiac Enzymes: No results for input(s): CKTOTAL, CKMB, CKMBINDEX, TROPONINI in the last 168 hours.  BNP (last 3 results) No results for input(s): BNP in the last 8760 hours.  ProBNP (last 3 results) No results for input(s): PROBNP in the last 8760 hours.  CBG: No results for input(s): GLUCAP in the last 168 hours.  Radiological Exams on Admission: DG Chest 2 View  Result Date: 04/02/2021 CLINICAL DATA:  Sepsis. EXAM: CHEST - 2 VIEW COMPARISON:  Chest x-ray dated January 03, 2013. FINDINGS: The heart size and mediastinal contours are within normal limits. Normal pulmonary vascularity. Mild bibasilar atelectasis. No focal consolidation, pleural effusion, or pneumothorax. No acute osseous abnormality. IMPRESSION: 1. Mild bibasilar atelectasis. Electronically Signed   By: Obie Dredge M.D.   On: 04/02/2021 15:16    EKG: I independently viewed the EKG done and my findings are as followed: A flutter rate 95.  With nonspecific ST-T changes.  QTc 458.  Assessment/Plan Present on Admission: **None**  Active Problems:   Weakness  Generalized weakness in the setting of COVID-19 viral infection. COVID-19 screening test positive on 04/02/2021. Obtain inflammatory markers Encourage oral intake as tolerated Out of bed to chair, mobilize as tolerated. PT OT to assess Fall precautions  COVID-19 viral infection No evidence of pneumonia on chest x-ray, personally  reviewed Not hypoxic with O2 saturation 95% on room air. Holding off remdesivir due to elevated AST, repeat CMP in the AM. Multivitamins, C and zinc. Follow inflammatory markers. Follow fever curve and WBC Judicious use of Tylenol for fever to avoid worsening of elevation of liver chemistries.  Elevated liver enzyme AST 74 Monitor for now Use acetaminophen judiciously for fever Repeat CMP in the morning  Bipolar disorder/paranoid schizophrenia Resume home antipsychotic medications.    DVT prophylaxis: Subcu Lovenox daily  Code Status: Full code  Family Communication: None at bedside  Disposition Plan: Admitted to MedSurg unit with remote telemetry  Consults called: None  Admission status: Inpatient status.  Patient will require at least 2 midnights for further evaluation and treatment of present condition.   Status is: Inpatient    Dispo:  Patient From:  Home  Planned Disposition:  Group Home  Medically stable for discharge:  No          Darlin Drop MD [facility] Pager [phone]  If 7PM-7AM, please contact night-coverage [email or web address] Password [facility]  04/02/2021, 5:52 PM

## 2021-04-02 NOTE — ED Notes (Signed)
Patient completely saturated from upper back to feet with urine; clothing from home soaked in urine; This RN conducted complete linen change/ new incontinence pads, new gown, new socks; patient clothing (x 2 shirts/ pants, socks, footwear) but into belongings bag; urinal provided

## 2021-04-02 NOTE — ED Triage Notes (Signed)
Pt to ED [facility] from [facility]. Ems reports c/o lower back pain, has been exposed to covid.  Pt not answering all questions appropriately, unknown if baseline.  Pt in NAD

## 2021-04-03 ENCOUNTER — Encounter: Payer: Self-pay | Admitting: Internal Medicine

## 2021-04-03 ENCOUNTER — Inpatient Hospital Stay: Payer: Medicare HMO

## 2021-04-03 DIAGNOSIS — U071 COVID-19: Principal | ICD-10-CM

## 2021-04-03 DIAGNOSIS — E785 Hyperlipidemia, unspecified: Secondary | ICD-10-CM

## 2021-04-03 DIAGNOSIS — N400 Enlarged prostate without lower urinary tract symptoms: Secondary | ICD-10-CM

## 2021-04-03 DIAGNOSIS — F32A Depression, unspecified: Secondary | ICD-10-CM | POA: Insufficient documentation

## 2021-04-03 DIAGNOSIS — R7989 Other specified abnormal findings of blood chemistry: Secondary | ICD-10-CM | POA: Insufficient documentation

## 2021-04-03 DIAGNOSIS — F209 Schizophrenia, unspecified: Secondary | ICD-10-CM | POA: Diagnosis not present

## 2021-04-03 DIAGNOSIS — R531 Weakness: Secondary | ICD-10-CM | POA: Diagnosis not present

## 2021-04-03 DIAGNOSIS — I10 Essential (primary) hypertension: Secondary | ICD-10-CM

## 2021-04-03 LAB — CBC WITH DIFFERENTIAL/PLATELET
Abs Immature Granulocytes: 0.02 10*3/uL (ref 0.00–0.07)
Basophils Absolute: 0 10*3/uL (ref 0.0–0.1)
Basophils Relative: 0 %
Eosinophils Absolute: 0 10*3/uL (ref 0.0–0.5)
Eosinophils Relative: 0 %
HCT: 43.4 % (ref 39.0–52.0)
Hemoglobin: 14.7 g/dL (ref 13.0–17.0)
Immature Granulocytes: 0 %
Lymphocytes Relative: 8 %
Lymphs Abs: 0.6 10*3/uL — ABNORMAL LOW (ref 0.7–4.0)
MCH: 30.9 pg (ref 26.0–34.0)
MCHC: 33.9 g/dL (ref 30.0–36.0)
MCV: 91.4 fL (ref 80.0–100.0)
Monocytes Absolute: 1.1 10*3/uL — ABNORMAL HIGH (ref 0.1–1.0)
Monocytes Relative: 15 %
Neutro Abs: 5.7 10*3/uL (ref 1.7–7.7)
Neutrophils Relative %: 77 %
Platelets: 104 10*3/uL — ABNORMAL LOW (ref 150–400)
RBC: 4.75 MIL/uL (ref 4.22–5.81)
RDW: 14 % (ref 11.5–15.5)
WBC: 7.5 10*3/uL (ref 4.0–10.5)
nRBC: 0 % (ref 0.0–0.2)

## 2021-04-03 LAB — COMPREHENSIVE METABOLIC PANEL
ALT: 48 U/L — ABNORMAL HIGH (ref 0–44)
AST: 269 U/L — ABNORMAL HIGH (ref 15–41)
Albumin: 3.5 g/dL (ref 3.5–5.0)
Alkaline Phosphatase: 66 U/L (ref 38–126)
Anion gap: 7 (ref 5–15)
BUN: 14 mg/dL (ref 8–23)
CO2: 25 mmol/L (ref 22–32)
Calcium: 8.7 mg/dL — ABNORMAL LOW (ref 8.9–10.3)
Chloride: 104 mmol/L (ref 98–111)
Creatinine, Ser: 0.9 mg/dL (ref 0.61–1.24)
GFR, Estimated: >60 mL/min (ref >60)
Glucose, Bld: 77 mg/dL (ref 70–99)
Potassium: 3.6 mmol/L (ref 3.5–5.1)
Sodium: 136 mmol/L (ref 135–145)
Total Bilirubin: 0.8 mg/dL (ref 0.3–1.2)
Total Protein: 6.9 g/dL (ref 6.5–8.1)

## 2021-04-03 LAB — FERRITIN: Ferritin: 204 ng/mL (ref 24–336)

## 2021-04-03 LAB — C-REACTIVE PROTEIN: CRP: 1.3 mg/dL — ABNORMAL HIGH (ref <1.0)

## 2021-04-03 LAB — MAGNESIUM: Magnesium: 1.8 mg/dL (ref 1.7–2.4)

## 2021-04-03 LAB — PHOSPHORUS: Phosphorus: 3.4 mg/dL (ref 2.5–4.6)

## 2021-04-03 LAB — D-DIMER, QUANTITATIVE: D-Dimer, Quant: 3.37 ug/mL-FEU — ABNORMAL HIGH (ref 0.00–0.50)

## 2021-04-03 IMAGING — CT CT ANGIO CHEST
2 of 6 series · 19 of 46 positions shown · IV contrast (APPLIED)
Comparison: None.

CLINICAL DATA: PE suspected, low/intermediate prob, positive
D-dimer

EXAM:
CT ANGIOGRAPHY CHEST WITH CONTRAST
TECHNIQUE: Multidetector CT imaging of the chest was performed using the
standard protocol during bolus administration of intravenous
contrast. Multiplanar CT image reconstructions and MIPs were
obtained to evaluate the vascular anatomy.
CONTRAST:  75mL OMNIPAQUE IOHEXOL 350 MG/ML SOLN

[Series 5: thins · axial · 0.72mm/px · z∈[-311,-26]mm · 17 of 313 slices shown]
[im 14/313  lung]
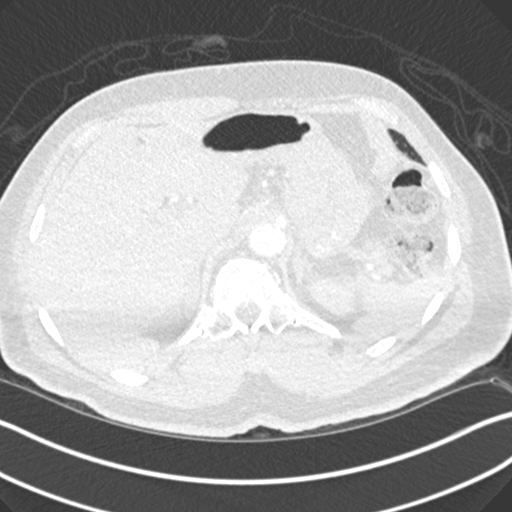
[im 28/313  soft-tissue]
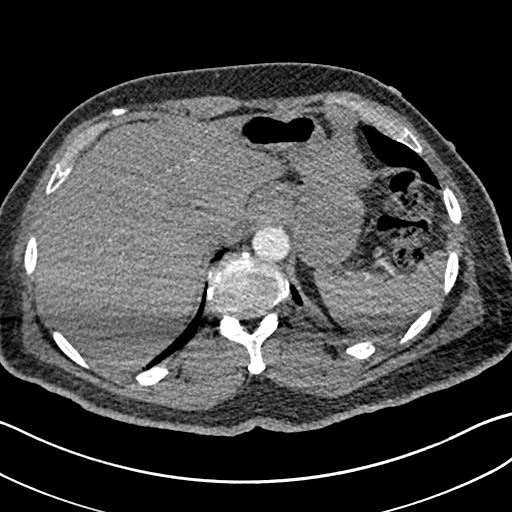
[im 55/313  lung]
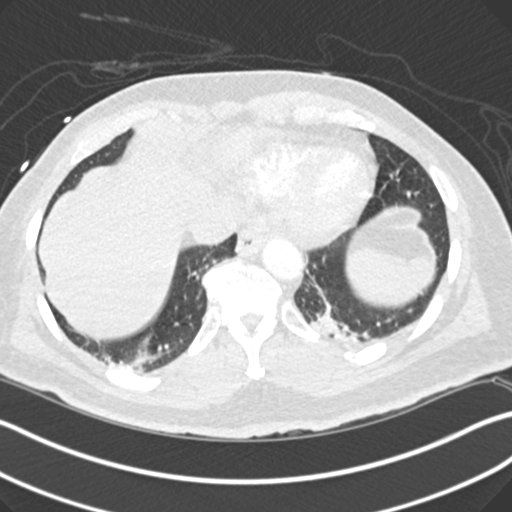
[im 68/313  soft-tissue]
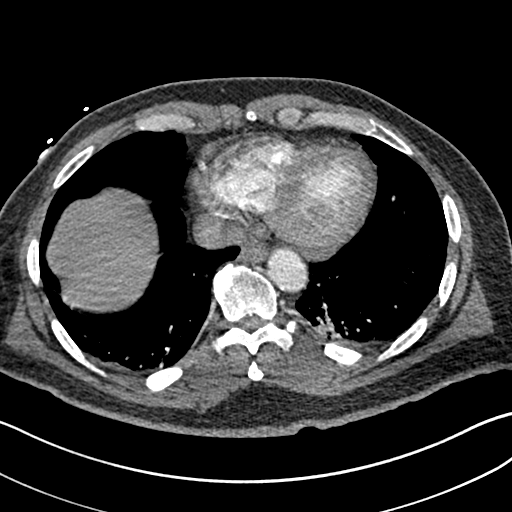
[im 82/313  lung]
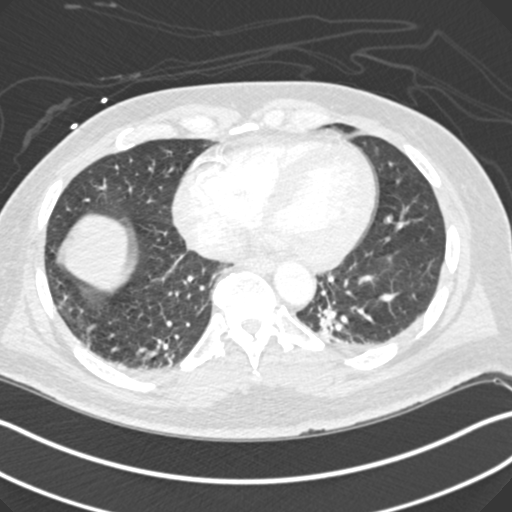
[im 109/313  soft-tissue]
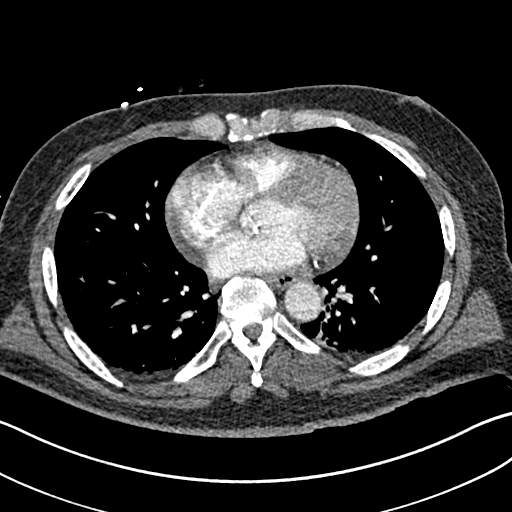
[im 123/313  lung]
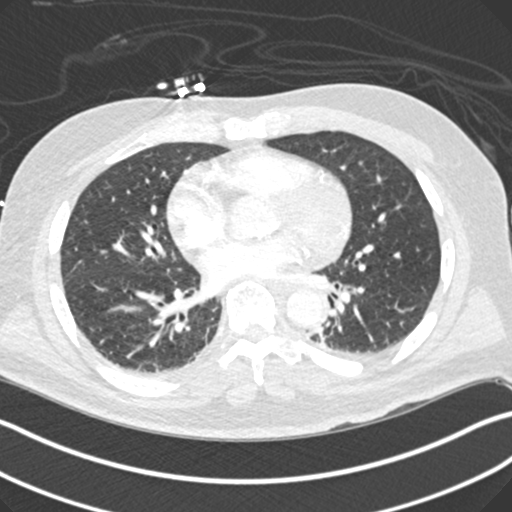
[im 136/313  soft-tissue]
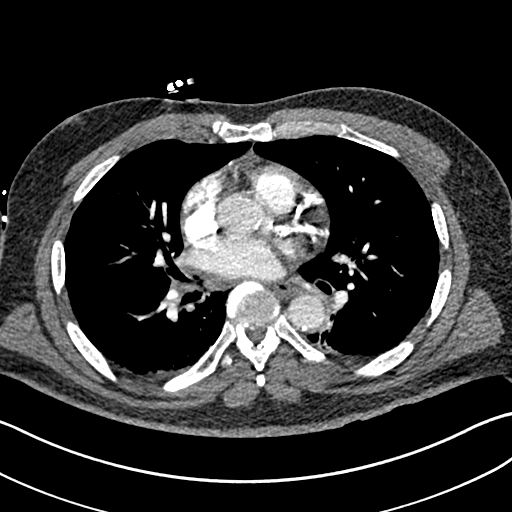
[im 163/313  lung]
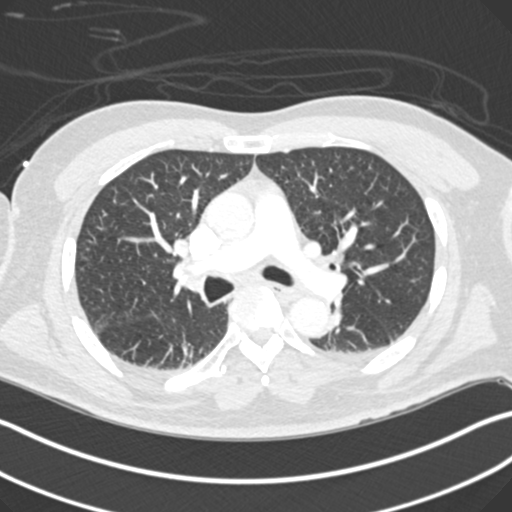
[im 177/313  soft-tissue]
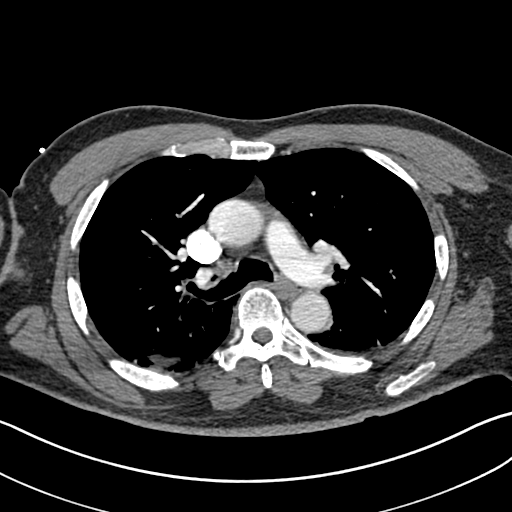
[im 190/313  lung]
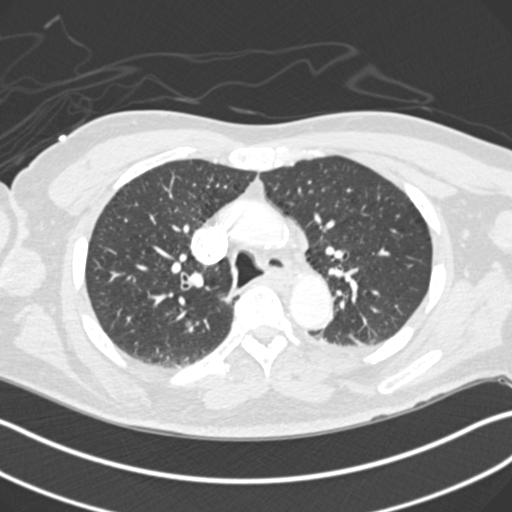
[im 204/313  soft-tissue]
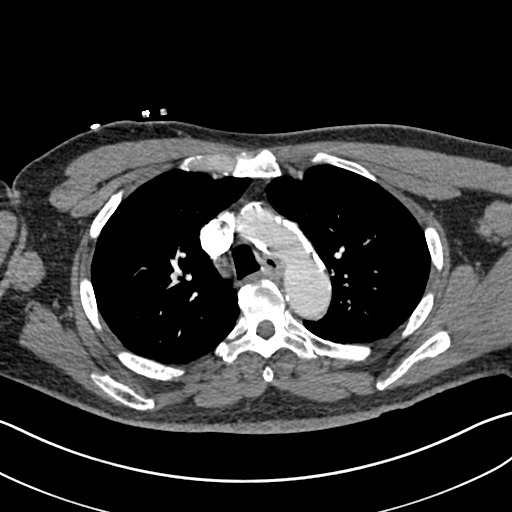
[im 231/313  lung]
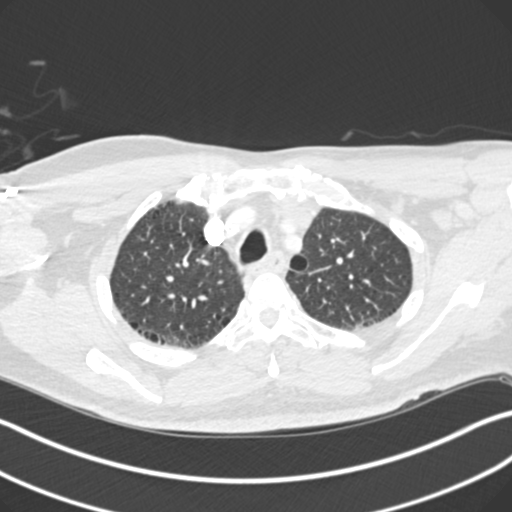
[im 245/313  soft-tissue]
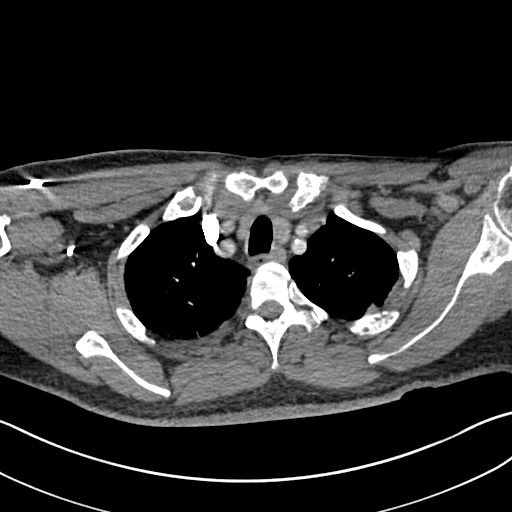
[im 258/313  lung]
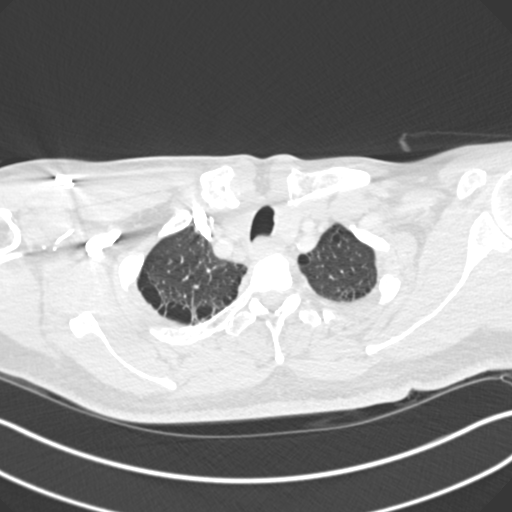
[im 285/313  soft-tissue]
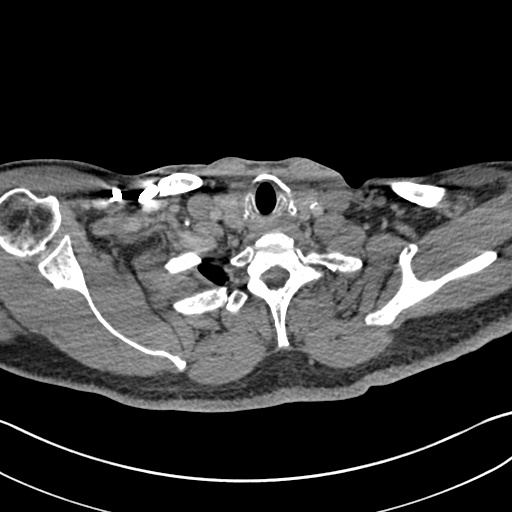
[im 299/313  lung]
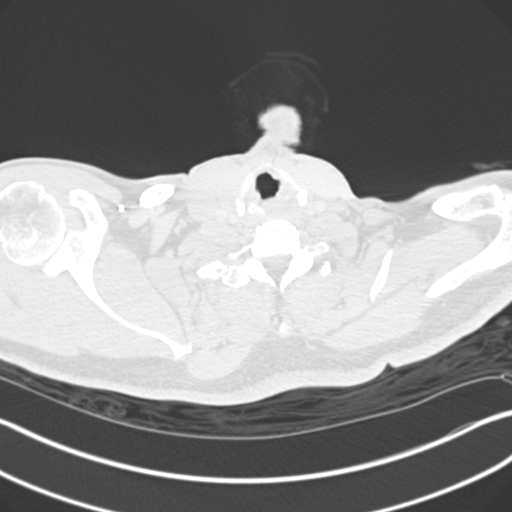

[Series 7: coronal mpr · coronal · 0.61mm/px · 2 of 103 slices shown]
[im 35/103  soft-tissue]
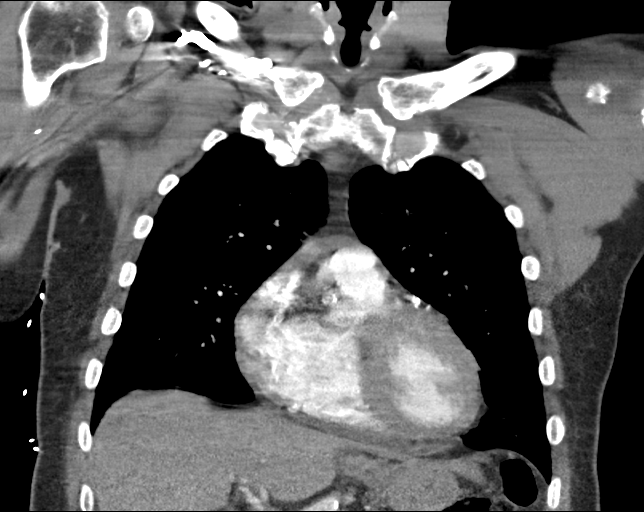
[im 69/103  soft-tissue]
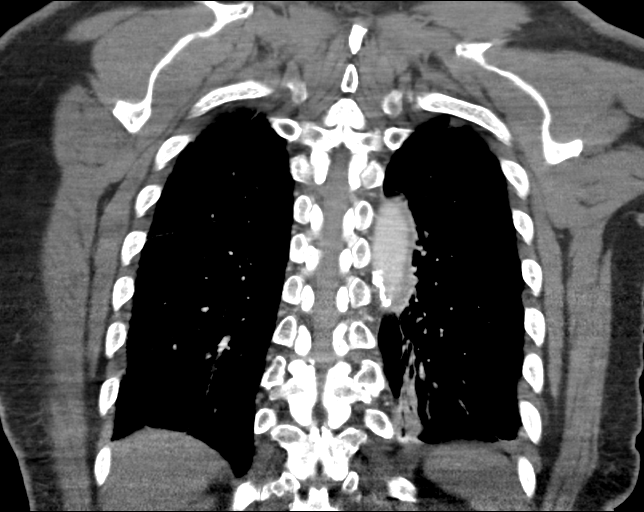

[19 of 46 positions shown; findings below may reference images not displayed]

FINDINGS: Cardiovascular: No filling defects in the pulmonary arteries to
suggest pulmonary emboli. Diffuse coronary artery and aortic
calcifications. Heart is normal size. Aorta is normal caliber.

Mediastinum/Nodes: No mediastinal, hilar, or axillary adenopathy.
Trachea and esophagus are unremarkable. Thyroid unremarkable.

Lungs/Pleura: Moderate paraseptal emphysema. Dependent bibasilar
opacities, likely atelectasis. No effusions.

Upper Abdomen: Imaging into the upper abdomen demonstrates no acute
findings.

Musculoskeletal: Chest wall soft tissues are unremarkable. No acute
bony abnormality.

Review of the MIP images confirms the above findings.
IMPRESSION: No evidence of pulmonary embolus.

Diffuse coronary artery disease.

Bibasilar atelectasis dependently.

Aortic Atherosclerosis ([XM]-[XM]) and Emphysema ([XM]-[XM]).

## 2021-04-03 IMAGING — US US EXTREM LOW VENOUS
1 series · 13 of 24 positions shown · non-contrast
Comparison: None.

CLINICAL DATA: Elevated D-dimer and positive for [CC].



[Series 1: us venous img lower bilat (dvt) · portal-venous · 13 of 58 slices shown]
[im 1/58]
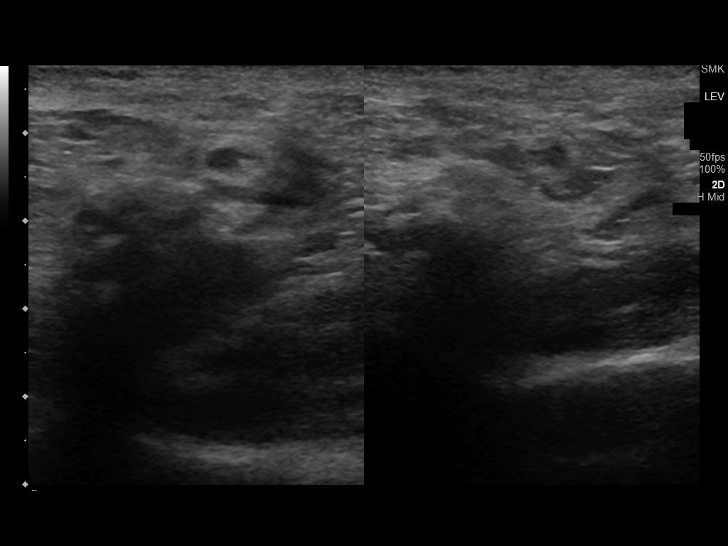
[im 5/58]
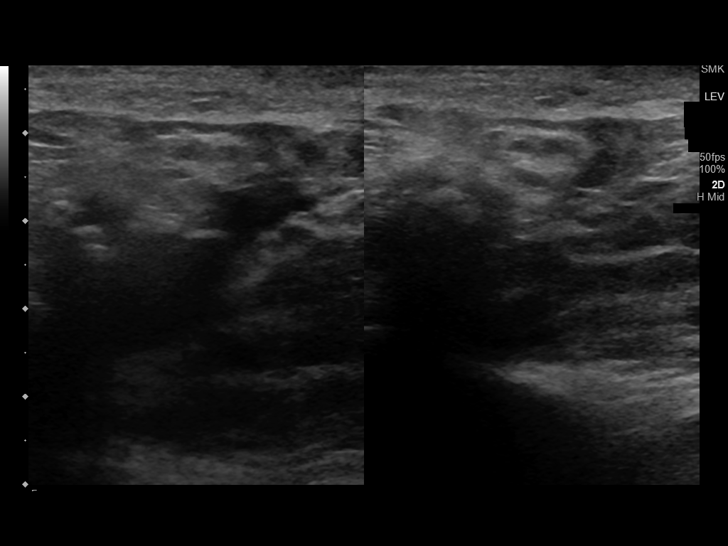
[im 10/58]
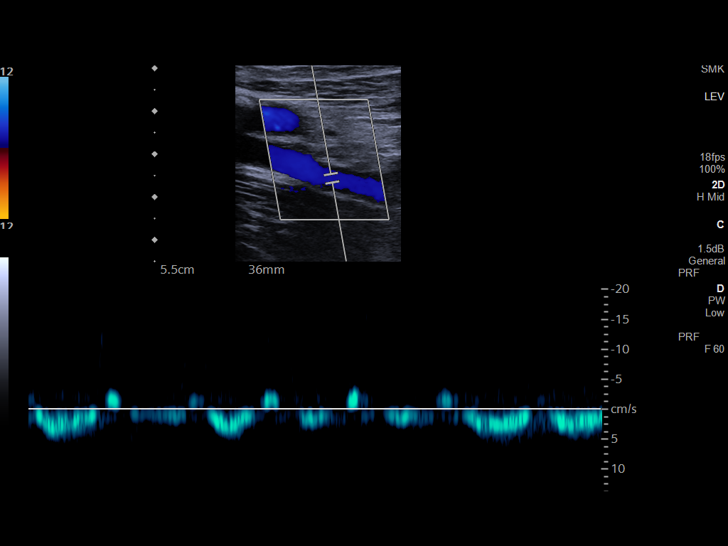
[im 15/58]
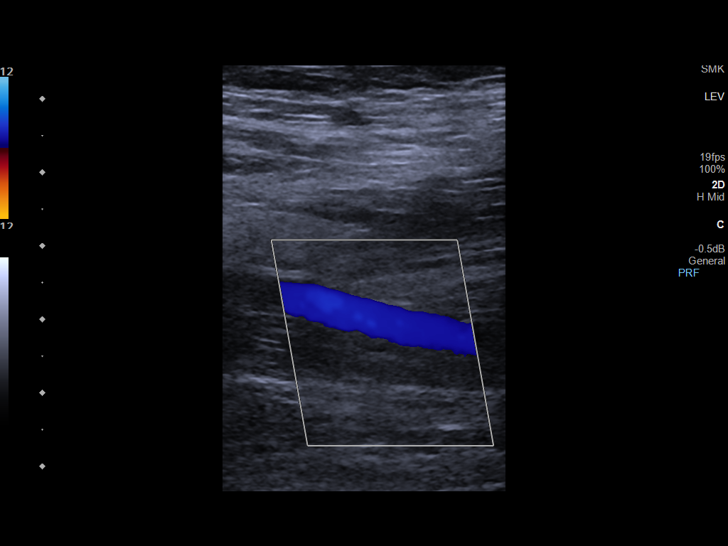
[im 20/58]
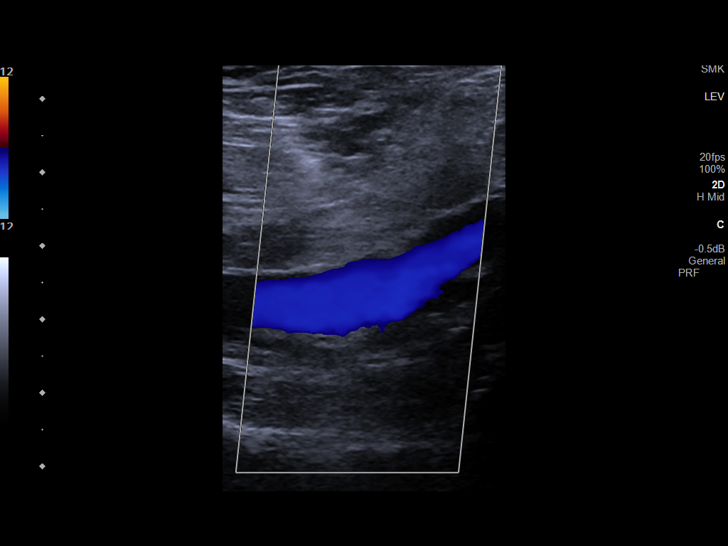
[im 25/58]
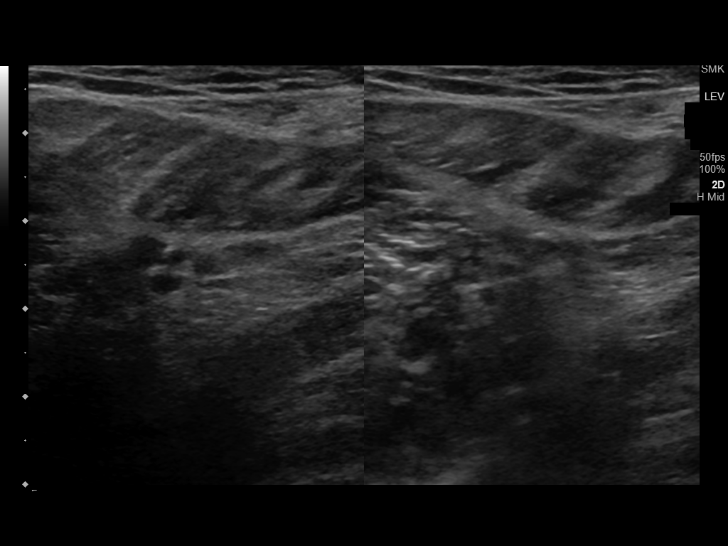
[im 30/58]
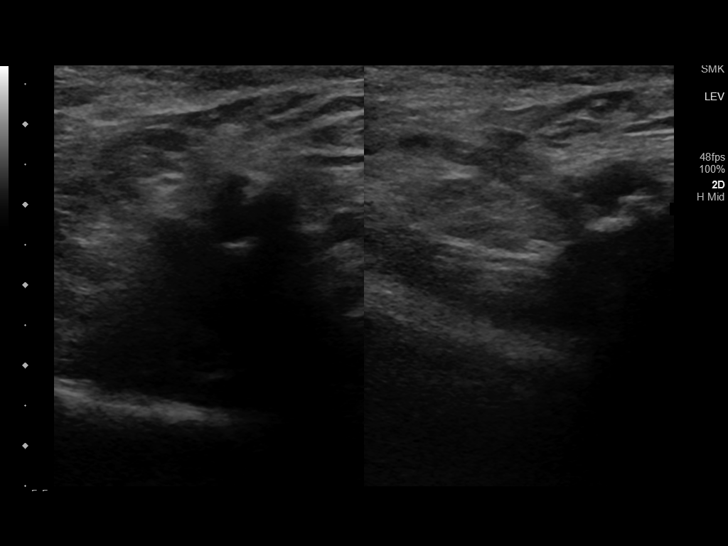
[im 33/58]
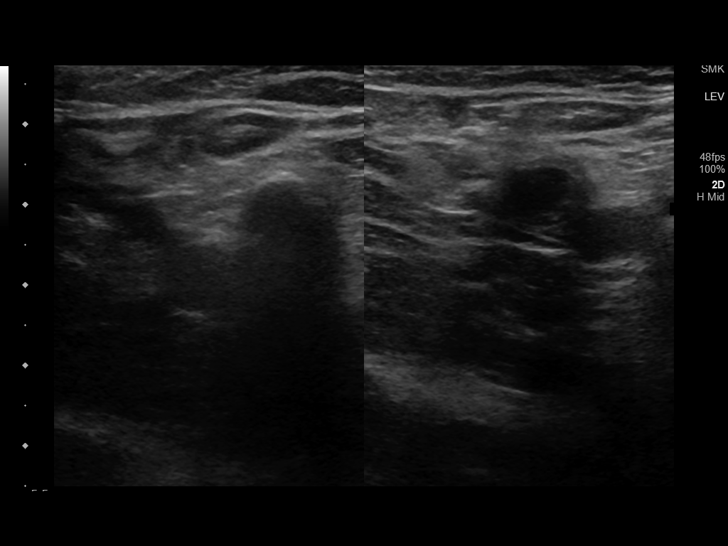
[im 38/58]
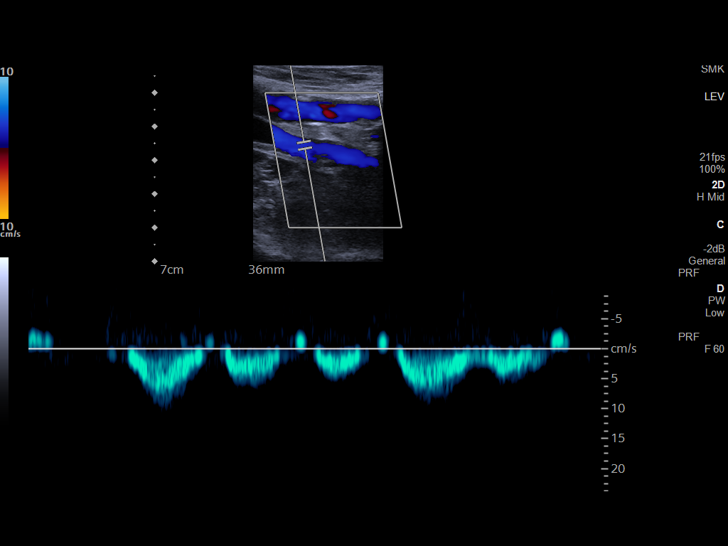
[im 43/58]
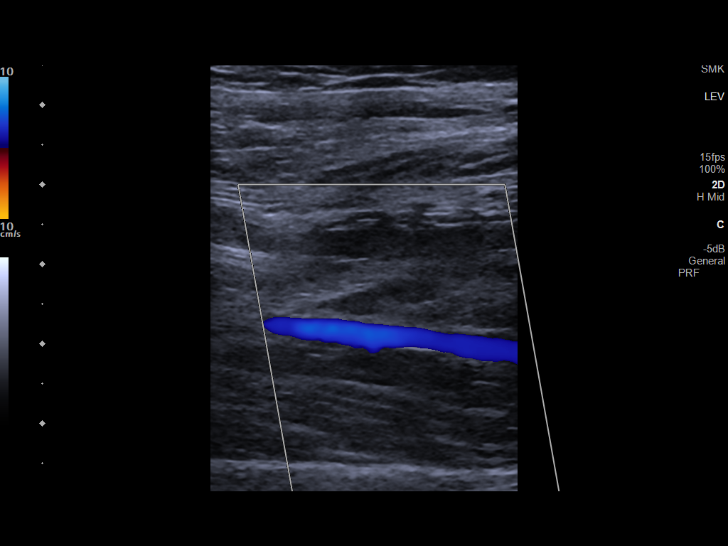
[im 48/58]
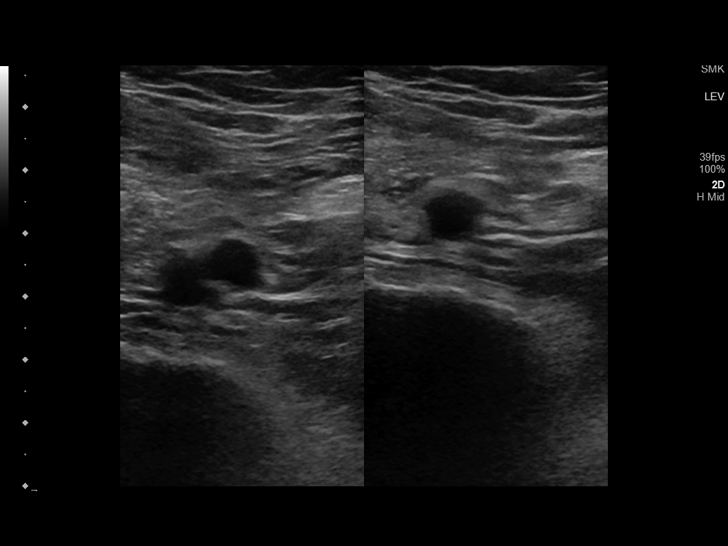
[im 53/58]
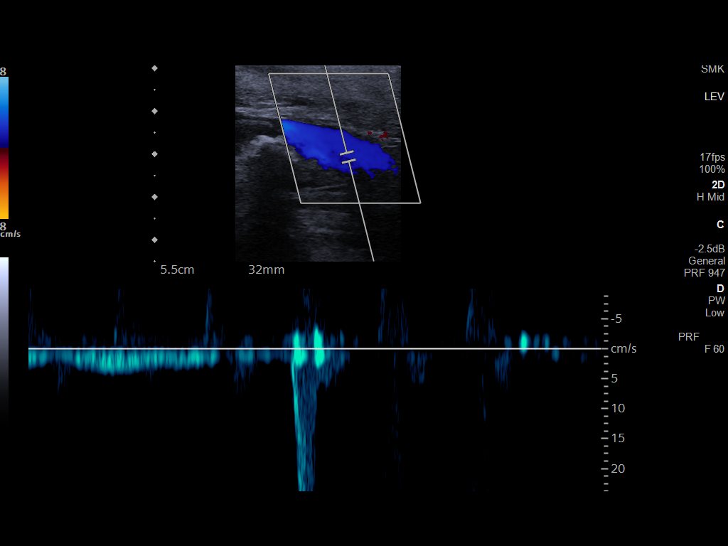
[im 58/58]
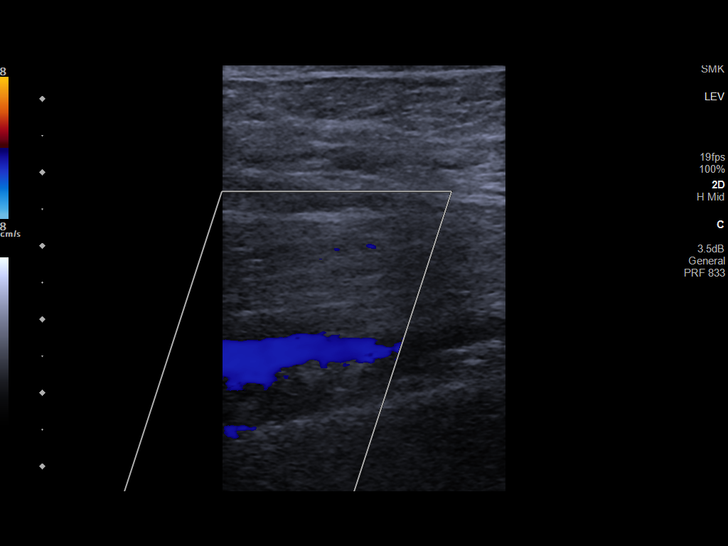

[13 of 24 positions shown; findings below may reference images not displayed]

FINDINGS: RIGHT LOWER EXTREMITY

Common Femoral Vein: No evidence of thrombus. Normal
compressibility, respiratory phasicity and response to augmentation.

Saphenofemoral Junction: No evidence of thrombus. Normal
compressibility and flow on color Doppler imaging.

Profunda Femoral Vein: No evidence of thrombus. Normal
compressibility and flow on color Doppler imaging.

Femoral Vein: No evidence of thrombus. Normal compressibility,
respiratory phasicity and response to augmentation.

Popliteal Vein: No evidence of thrombus. Normal compressibility,
respiratory phasicity and response to augmentation.

Calf Veins: No evidence of thrombus. Normal compressibility and flow
on color Doppler imaging.

Superficial Great Saphenous Vein: No evidence of thrombus. Normal
compressibility.

Venous Reflux:  None.

Other Findings: No evidence of superficial thrombophlebitis or
abnormal fluid collection.

LEFT LOWER EXTREMITY

Common Femoral Vein: No evidence of thrombus. Normal
compressibility, respiratory phasicity and response to augmentation.

Saphenofemoral Junction: No evidence of thrombus. Normal
compressibility and flow on color Doppler imaging.

Profunda Femoral Vein: No evidence of thrombus. Normal
compressibility and flow on color Doppler imaging.

Femoral Vein: No evidence of thrombus. Normal compressibility,
respiratory phasicity and response to augmentation.

Popliteal Vein: No evidence of thrombus. Normal compressibility,
respiratory phasicity and response to augmentation.

Calf Veins: No evidence of thrombus. Normal compressibility and flow
on color Doppler imaging.

Superficial Great Saphenous Vein: No evidence of thrombus. Normal
compressibility.

Venous Reflux:  None.

Other Findings: No evidence of superficial thrombophlebitis or
abnormal fluid collection.
IMPRESSION: No evidence of deep venous thrombosis in either lower extremity.

## 2021-04-03 MED ORDER — IOHEXOL 350 MG/ML SOLN
75.0000 mL | Freq: Once | INTRAVENOUS | Status: AC | PRN
  Administered 2021-04-03: 75 mL via INTRAVENOUS

## 2021-04-03 MED ORDER — IPRATROPIUM-ALBUTEROL 20-100 MCG/ACT IN AERS
1.0000 | INHALATION_SPRAY | Freq: Four times a day (QID) | RESPIRATORY_TRACT | Status: DC
  Administered 2021-04-03 – 2021-04-14 (×43): 1 via RESPIRATORY_TRACT
  Filled 2021-04-03: qty 4

## 2021-04-03 MED ORDER — NIRMATRELVIR/RITONAVIR (PAXLOVID)TABLET
3.0000 | ORAL_TABLET | Freq: Two times a day (BID) | ORAL | Status: AC
  Administered 2021-04-03 – 2021-04-07 (×9): 3 via ORAL
  Filled 2021-04-03 (×2): qty 30

## 2021-04-03 MED ORDER — AMLODIPINE BESYLATE 5 MG PO TABS
5.0000 mg | ORAL_TABLET | Freq: Every day | ORAL | Status: DC
  Administered 2021-04-04 – 2021-04-14 (×11): 5 mg via ORAL
  Filled 2021-04-03 (×11): qty 1

## 2021-04-03 MED ORDER — RISPERIDONE 0.5 MG PO TABS
0.5000 mg | ORAL_TABLET | Freq: Every day | ORAL | Status: DC
  Administered 2021-04-04 – 2021-04-14 (×11): 0.5 mg via ORAL
  Filled 2021-04-03 (×11): qty 1

## 2021-04-03 MED ORDER — TRAZODONE HCL 50 MG PO TABS
25.0000 mg | ORAL_TABLET | Freq: Every day | ORAL | Status: DC
  Administered 2021-04-03 – 2021-04-13 (×11): 25 mg via ORAL
  Filled 2021-04-03 (×12): qty 1

## 2021-04-03 MED ORDER — METHYLPREDNISOLONE SODIUM SUCC 40 MG IJ SOLR
40.0000 mg | Freq: Every day | INTRAMUSCULAR | Status: DC
  Administered 2021-04-03 – 2021-04-07 (×5): 40 mg via INTRAVENOUS
  Filled 2021-04-03 (×5): qty 1

## 2021-04-03 NOTE — ED Notes (Signed)
Patient transported to CT 

## 2021-04-03 NOTE — Progress Notes (Signed)
PT Cancellation Note  Patient Details Name: [name] MRN: [id number] DOB: 10-13-1949   Cancelled Treatment:    Reason Eval/Treat Not Completed: Medical issues which prohibited therapy (Consult received and chart reviewed.  Patient noted with orders for pending CT chest and LE doppler to rule out PE/DVT.  Will hold evaluation at this time, but continue to follow and initiate as medically appropriate.)   Geeta Dworkin H. Manson Passey, PT, DPT, NCS 04/03/21, 11:21 AM [phone]

## 2021-04-03 NOTE — ED Notes (Signed)
Pt given dinner. Pt states all other needs being met. VSS

## 2021-04-03 NOTE — Progress Notes (Signed)
Patient ID: [name], male   DOB: 03-19-1950, 70 y.o.   MRN: [id number] [facility] PROGRESS NOTE  [name] NUU:[id number] DOB: [birth year] DOA: 04/02/2021 PCP: Ronald Bound, NP  HPI/Subjective: Patient sent in because he could not walk.  Found to have a COVID infection.  Some shortness of breath and cough.  Found to have an elevated D-dimer.  Objective: Vitals:   04/03/21 1030 04/03/21 1100  BP: (!) 141/75 131/63  Pulse: 81 98  Resp: 13 18  Temp:    SpO2: 95% 93%    Intake/Output Summary (Last 24 hours) at 04/03/2021 1141 Last data filed at 04/03/2021 0203 Gross per 24 hour  Intake 1000 ml  Output 400 ml  Net 600 ml   Filed Weights   04/02/21 1411  Weight: 90 kg    ROS: Review of Systems  Constitutional:  Positive for malaise/fatigue.  Respiratory:  Positive for cough and shortness of breath.   Cardiovascular:  Negative for chest pain.  Gastrointestinal:  Positive for diarrhea. Negative for abdominal pain, nausea and vomiting.  Exam: Physical Exam HENT:     Head: Normocephalic.     Mouth/Throat:     Pharynx: No oropharyngeal exudate.  Eyes:     General: Lids are normal.     Conjunctiva/sclera: Conjunctivae normal.  Cardiovascular:     Rate and Rhythm: Normal rate and regular rhythm.     Heart sounds: Normal heart sounds, S1 normal and S2 normal.  Pulmonary:     Breath sounds: Examination of the right-middle field reveals wheezing. Examination of the left-middle field reveals wheezing. Examination of the right-lower field reveals decreased breath sounds and wheezing. Examination of the left-lower field reveals decreased breath sounds and wheezing. Decreased breath sounds and wheezing present. No rhonchi or rales.  Abdominal:     Palpations: Abdomen is soft.     Tenderness: There is no abdominal tenderness.  Musculoskeletal:     Right lower leg: No swelling.     Left lower leg: No swelling.  Skin:    General: Skin is warm.     Findings: No rash.   Neurological:     Mental Status: He is alert.     Comments: Answer some simple yes or no questions.  Able to straight leg raise.      Scheduled Meds:  [START ON 04/04/2021] amLODipine  5 mg Oral Daily   vitamin C  500 mg Oral Daily   cholecalciferol  1,000 Units Oral Daily   citalopram  40 mg Oral Daily   enoxaparin (LOVENOX) injection  40 mg Subcutaneous Q24H   fluPHENAZine  5 mg Oral Daily   Ipratropium-Albuterol  1 puff Inhalation QID   methylPREDNISolone (SOLU-MEDROL) injection  40 mg Intravenous Daily   multivitamin with minerals  1 tablet Oral Daily   nirmatrelvir/ritonavir EUA  3 tablet Oral BID   [START ON 04/04/2021] risperiDONE  0.5 mg Oral Daily   tamsulosin  0.4 mg Oral QPC breakfast   traZODone  25 mg Oral QHS   zinc sulfate  220 mg Oral Daily     Assessment/Plan:  COVID-19 infection with generalized weakness.  The patient lives in a group home and has a roommate so will be difficult to isolate.  The patient will also need to be able to walk prior to getting back to the group home.  Spoke with pharmacist and because LFTs are more elevated today will try Paxlovid instead.  Add steroids with wheezing.  Transitional care team evaluation. Elevated D-dimer.  Get CT scan of the chest to rule out PE and ultrasound of the lower extremities to rule out DVT. Schizophrenia and bipolar disorder and depression.  Continue psychiatric medications Elevated liver function test hold simvastatin Hyperlipidemia unspecified hold simvastatin while on Paxil GERD BPH on Flomax Essential hypertension on Norvasc we will cut down the dose to 5 mg daily        Code Status:     Code Status Orders  (From admission, onward)           Start     Ordered   04/02/21 1747  Full code  Continuous        04/02/21 1747           Code Status History     Date Active Date Inactive Code Status Order ID Comments User Context   12/03/2020 2345 12/05/2020 1919 Full Code [id number]  Darlin Priestly,  MD Inpatient   12/03/2020 2118 12/03/2020 2345 Full Code [id number]  Andris Baumann, MD Inpatient      Family Communication: Spoke with [name] the group home manager.  The patient has a roommate and will be difficult for him to isolate.  No one else at the group home has COVID symptoms at this point. Disposition Plan: Status is: Inpatient  Dispo:  Patient From: Group home  Planned Disposition: Group Home.  We will get transitional care team involved because may have to go through his isolation.  Here since he has a roommate at the group home.  Medically stable for discharge: No   Antibiotics: Start Paxlovid  Time spent: 27 minutes  [name] [employer]

## 2021-04-03 NOTE — Evaluation (Signed)
Occupational Therapy Evaluation Patient Details Name: [name] MRN: [id number] DOB: Jul 17, 1950 Today's Date: 04/03/2021   History of Present Illness [name] is a 70 y.o. male with medical history significant for bipolar disorder, paranoid schizophrenia, hypertension, hyperlipidemia, who presented to [facility] ED from group home due to generalized weakness noted on the day of presentation associated with inability to walk and diffuse body aches mostly in his lower back. Screening test for COVID-19 positive on 04/02/21 in the ED. Chest x-ray showing mild bibasilar atelectasis. Not hypoxic: O2 saturation 95% on room air.   Clinical Impression   [name] presents today with generalized weakness, limited endurance, and greatly reduced ability to engage in fxl mobility tasks. Prior to admission, he lived in group home and was IND in ADL and fxl mobility; he ambulated w/o AD. Staff at the group home assisted with medication mgmt, cooking, cleaning, shopping, and other IADL. Today pt is lethargic, largely non-communicative, and struggles to perform basic bed mobility, requiring Mod A for sit<>stand and displays poor sitting balance. He maintains SpO2 at 96% or greater throughout session, on room air. HR 97 in supine, increasing to 127 with bed mobility. He is oriented to self and place, but not to time or situation. Given pt's fatigue and fact that he is far from his baseline level of ADL performance, recommend ongoing OT while hospitalized, with DC to group home as able, with HHOT if needed to restore pt to PLOF.     Recommendations for follow up therapy are one component of a multi-disciplinary discharge planning process, led by the attending physician.  Recommendations may be updated based on patient status, additional functional criteria and insurance authorization.   Follow Up Recommendations  Home health OT    Equipment Recommendations  None recommended by OT    Recommendations for Other  Services       Precautions / Restrictions Precautions Precautions: Fall Restrictions Weight Bearing Restrictions: No      Mobility Bed Mobility Overal bed mobility: Needs Assistance Bed Mobility: Rolling;Sidelying to Sit;Sit to Sidelying Rolling: Mod assist Sidelying to sit: Mod assist     Sit to sidelying: Mod assist General bed mobility comments: Pt requires increased time, effort, Mod A for supine<>sit. Reports that PTA, he could perform bed mobility INDly    Transfers                 General transfer comment: deferred    Balance Overall balance assessment: Needs assistance Sitting-balance support: Bilateral upper extremity supported;Feet unsupported Sitting balance-Leahy Scale: Poor         Standing balance comment: deferred, 2/2 fatigue, unsteadiness in sitting                           ADL either performed or assessed with clinical judgement   ADL Overall ADL's : Needs assistance/impaired                                             Vision         Perception     Praxis      Pertinent Vitals/Pain Pain Assessment: No/denies pain     Hand Dominance Right   Extremity/Trunk Assessment Upper Extremity Assessment Upper Extremity Assessment: Overall WFL for tasks assessed   Lower Extremity Assessment Lower Extremity Assessment: Overall WFL for tasks assessed  Communication Communication Communication: No difficulties   Cognition Arousal/Alertness: Lethargic Behavior During Therapy: WFL for tasks assessed/performed;Flat affect Overall Cognitive Status: History of cognitive impairments - at baseline                                 General Comments: Able to provide his name and birthdate. Aware he is in the hospital, but cannot state why. Reports that he has never heard of COVID. Unable to provide month or year.   General Comments       Exercises Other Exercises Other Exercises: Educ re:  POC, role of OT   Shoulder Instructions      Home Living Family/patient expects to be discharged to:: Group home                                 Additional Comments: 2 story home,but patient resides on main floor; Has 2 steps to enter with B rails.      Prior Functioning/Environment Level of Independence: Needs assistance  Gait / Transfers Assistance Needed: doesn't use any assistive device ADL's / Homemaking Assistance Needed: Independent with ADL management including bathing and dressing. Group home staff prepare meals, assist with med mgt, etc.            OT Problem List: Decreased strength;Impaired balance (sitting and/or standing);Decreased activity tolerance      OT Treatment/Interventions: Self-care/ADL training;Balance training;Therapeutic activities;DME and/or AE instruction;Therapeutic exercise;Energy conservation;Patient/family education    OT Goals(Current goals can be found in the care plan section) Acute Rehab OT Goals Patient Stated Goal: to get back home OT Goal Formulation: With patient Time For Goal Achievement: 04/17/21 Potential to Achieve Goals: Good ADL Goals Pt Will Perform Grooming: with supervision;standing;sitting (in sitting or standing, as able) Pt Will Perform Upper Body Dressing: with supervision;standing;sitting (in sitting or standing, as able) Pt Will Perform Lower Body Dressing: with supervision;sit to/from stand Pt Will Transfer to Toilet: with supervision;stand pivot transfer (using LRAD)  OT Frequency: Min 1X/week   Barriers to D/C: Other (comment) (Pt unable to return to group home until beyond contagion stage)          Co-evaluation              AM-PAC OT "6 Clicks" Daily Activity     Outcome Measure Help from another person eating meals?: None Help from another person taking care of personal grooming?: A Lot Help from another person toileting, which includes using toliet, bedpan, or urinal?: A Lot Help from  another person bathing (including washing, rinsing, drying)?: A Lot Help from another person to put on and taking off regular upper body clothing?: A Lot Help from another person to put on and taking off regular lower body clothing?: A Lot 6 Click Score: 14   End of Session    Activity Tolerance: Patient limited by fatigue Patient left: in bed;with call bell/phone within reach  OT Visit Diagnosis: Unsteadiness on feet (R26.81);Other symptoms and signs involving cognitive function                Time: 6761-9509 OT Time Calculation (min): 12 min Charges:  OT General Charges $OT Visit: 1 Visit OT Evaluation $OT Eval Moderate Complexity: 1 Mod OT Treatments $Self Care/Home Management : 8-22 mins Latina Craver, PhD, MS, OTR/L 04/03/21, 5:11 PM

## 2021-04-03 NOTE — ED Notes (Signed)
Patient had episode of stool incontinence; patient cleaned/ new incontinence pads applied

## 2021-04-04 DIAGNOSIS — F200 Paranoid schizophrenia: Secondary | ICD-10-CM | POA: Diagnosis not present

## 2021-04-04 DIAGNOSIS — R7989 Other specified abnormal findings of blood chemistry: Secondary | ICD-10-CM | POA: Diagnosis not present

## 2021-04-04 DIAGNOSIS — R531 Weakness: Secondary | ICD-10-CM | POA: Diagnosis not present

## 2021-04-04 DIAGNOSIS — U071 COVID-19: Secondary | ICD-10-CM | POA: Diagnosis not present

## 2021-04-04 LAB — COMPREHENSIVE METABOLIC PANEL
ALT: 52 U/L — ABNORMAL HIGH (ref 0–44)
AST: 207 U/L — ABNORMAL HIGH (ref 15–41)
Albumin: 3.8 g/dL (ref 3.5–5.0)
Alkaline Phosphatase: 66 U/L (ref 38–126)
Anion gap: 7 (ref 5–15)
BUN: 20 mg/dL (ref 8–23)
CO2: 25 mmol/L (ref 22–32)
Calcium: 9.2 mg/dL (ref 8.9–10.3)
Chloride: 101 mmol/L (ref 98–111)
Creatinine, Ser: 0.94 mg/dL (ref 0.61–1.24)
GFR, Estimated: >60 mL/min (ref >60)
Glucose, Bld: 106 mg/dL — ABNORMAL HIGH (ref 70–99)
Potassium: 4.4 mmol/L (ref 3.5–5.1)
Sodium: 133 mmol/L — ABNORMAL LOW (ref 135–145)
Total Bilirubin: 0.7 mg/dL (ref 0.3–1.2)
Total Protein: 7.5 g/dL (ref 6.5–8.1)

## 2021-04-04 NOTE — Evaluation (Signed)
Physical Therapy Evaluation Patient Details Name: [name] MRN: [id number] DOB: 03/24/1950 Today's Date: 04/04/2021  History of Present Illness  [name] is a 70 y.o. male with medical history significant for bipolar disorder, paranoid schizophrenia, hypertension, hyperlipidemia, who presented to [facility] ED from group home due to generalized weakness noted on the day of presentation associated with inability to walk and diffuse body aches mostly in his lower back. Screening test for COVID-19 positive on 04/02/21 in the ED. Chest x-ray showing mild bibasilar atelectasis. Not hypoxic: O2 saturation 95% on room air.  Clinical Impression  Patient resting in bed upon arrival to room; alert and oriented to self, location as hospital.  Follows simple commands, but noted deficits in insight, safety awareness and integration/recall of new information.  Bilat UE/LE strength and ROM grossly symmetrical and WFL; no focal weakness appreciated.  Able to complete bed mobility with supervision; sit/stand, basic transfers and gait (40') with RW, cga/min assist.  Demonstrates choppy gait pattern with limited heel strike/toe off; increased sway in A/P plane (cga/min assist to present excessive posterior weight shift at times). Mod reliance on RW; do recommend continued use of assist device at this time. Would benefit from skilled PT to address above deficits and promote optimal return to PLOF.; Recommend transition to HHPT upon discharge from acute hospitalization.        Recommendations for follow up therapy are one component of a multi-disciplinary discharge planning process, led by the attending physician.  Recommendations may be updated based on patient status, additional functional criteria and insurance authorization.  Follow Up Recommendations Home health PT    Equipment Recommendations  Rolling walker with 5" wheels    Recommendations for Other Services       Precautions / Restrictions  Precautions Precautions: Fall Restrictions Weight Bearing Restrictions: No      Mobility  Bed Mobility Overal bed mobility: Needs Assistance Bed Mobility: Supine to Sit   Sidelying to sit: Supervision            Transfers Overall transfer level: Needs assistance Equipment used: Rolling walker (2 wheeled) Transfers: Sit to/from Stand Sit to Stand: Min guard;Min assist         General transfer comment: does require UE support for lift off and stabilization  Ambulation/Gait Ambulation/Gait assistance: Min guard Gait Distance (Feet): 40 Feet Assistive device: Rolling walker (2 wheeled)       General Gait Details: choppy gait pattern with limited heel strike/toe off; increased sway in A/P plane (cga/min assist to present excessive posterior weight shift at times). Mod reliance on RW; do recommend continued use of assist device at this time.  Stairs            Wheelchair Mobility    Modified Rankin (Stroke Patients Only)       Balance Overall balance assessment: Needs assistance Sitting-balance support: No upper extremity supported;Feet supported Sitting balance-Leahy Scale: Good     Standing balance support: Bilateral upper extremity supported Standing balance-Leahy Scale: Fair                               Pertinent Vitals/Pain Pain Assessment: No/denies pain    Home Living Family/patient expects to be discharged to:: Group home                      Prior Function Level of Independence: Needs assistance         Comments: Limited historian,  but indicates that he was ambulatory without assist device at baseline.  Denies fall history.     Hand Dominance   Dominant Hand: Right    Extremity/Trunk Assessment   Upper Extremity Assessment Upper Extremity Assessment: Overall WFL for tasks assessed    Lower Extremity Assessment Lower Extremity Assessment: Overall WFL for tasks assessed (grossly at least 4/5 throughout;  no focal weakness appreciated)       Communication   Communication: No difficulties (minimal attempts at spontaneous speech/interaction with therapist)  Cognition Arousal/Alertness: Awake/alert Behavior During Therapy: WFL for tasks assessed/performed Overall Cognitive Status: No family/caregiver present to determine baseline cognitive functioning                                 General Comments: Alert and oriented to self, location as hospital; follows simple commands.  Limited insight, safety awareness; limited recall of new information.      General Comments      Exercises Other Exercises Other Exercises: Sit/stand from various seating surfaces (edge of bed, recliner), cga/min assist for safety and balance Other Exercises: Standing balance during ADL/peri-care (completed by hygiene tech), cga/min assist with RW Other Exercises: Bed/chair transfer with RW, cga/min assist   Assessment/Plan    PT Assessment Patient needs continued PT services  PT Problem List Decreased strength;Decreased activity tolerance;Decreased balance;Decreased mobility;Decreased coordination;Decreased cognition;Decreased knowledge of use of DME;Decreased safety awareness;Cardiopulmonary status limiting activity;Decreased knowledge of precautions       PT Treatment Interventions DME instruction;Gait training;Stair training;Functional mobility training;Therapeutic activities;Therapeutic exercise;Balance training;Patient/family education;Cognitive remediation    PT Goals (Current goals can be found in the Care Plan section)  Acute Rehab PT Goals Patient Stated Goal: to get back home PT Goal Formulation: With patient Time For Goal Achievement: 04/18/21 Potential to Achieve Goals: Good    Frequency Min 2X/week   Barriers to discharge        Co-evaluation               AM-PAC PT "6 Clicks" Mobility  Outcome Measure Help needed turning from your back to your side while in a flat  bed without using bedrails?: None Help needed moving from lying on your back to sitting on the side of a flat bed without using bedrails?: None Help needed moving to and from a bed to a chair (including a wheelchair)?: A Little Help needed standing up from a chair using your arms (e.g., wheelchair or bedside chair)?: A Little Help needed to walk in hospital room?: A Little Help needed climbing 3-5 steps with a railing? : A Little 6 Click Score: 20    End of Session Equipment Utilized During Treatment: Gait belt Activity Tolerance: Patient tolerated treatment well Patient left: in chair;with call bell/phone within reach;with chair alarm set Nurse Communication: Mobility status PT Visit Diagnosis: Muscle weakness (generalized) (M62.81);Difficulty in walking, not elsewhere classified (R26.2)    Time: 1137-1207 PT Time Calculation (min) (ACUTE ONLY): 30 min   Charges:   PT Evaluation $PT Eval Moderate Complexity: 1 Mod PT Treatments $Therapeutic Activity: 8-22 mins        Adonte Vanriper H. Manson Passey, PT, DPT, NCS 04/04/21, 2:20 PM [phone]

## 2021-04-04 NOTE — Care Management Important Message (Signed)
Important Message  Patient Details  Name: [name] MRN: [id number] Date of Birth: [birth year]   Medicare Important Message Given:  N/A - LOS <3 / Initial given by admissions  Initial Medicare IM reviewed with Loma Sender, group home director, at [phone] by Elita Boone, Patient Access Associate on 04/03/2021 at 12:00pm.   Johnell Comings 04/04/2021, 8:26 AM

## 2021-04-04 NOTE — TOC Progression Note (Signed)
Transition of Care [facility]) - Progression Note    Patient Details  Name: [name] MRN: [id number] Date of Birth: 1950-01-12  [facility] CM/SW Contact  Caryn Section, RN Phone Number: 04/04/2021, 1:50 PM  Clinical Narrative:   Patient tested positive for COVID on 02 Apr 2021.  As per Loma Sender from group home: patient will not be able to return until 13 Apr 2021, as he has a roommate and they do not have the ability isolate him.    Marcial Pacas is also quite concerned about patient ambulatory status, as he will need to be fully ambulatory at the group home.  Explained that this will be communicated to our staff to ambulate patient as is appropriate.  tOC contact information given, tOC to follow to discharge.         Expected Discharge Plan and Services                                                 Social Determinants of Health (SDOH) Interventions    Readmission Risk Interventions No flowsheet data found.

## 2021-04-04 NOTE — Progress Notes (Signed)
Patient ID: [name], male   DOB: 21-Aug-1949, 70 y.o.   MRN: [id number] [facility] PROGRESS NOTE  Ronald [name]:[id number] DOB: 1950/05/27 DOA: 04/02/2021 PCP: Ronald Bound, NP  HPI/Subjective: Patient sent in from the group home secondary to weakness and the inability to walk and found to be COVID-positive.  Patient feels okay and offers no complaints.  Patient was able to walk 40 feet with physical therapy today.  Objective: Vitals:   04/04/21 0804 04/04/21 1144  BP: 135/67 (!) 151/79  Pulse: 63 93  Resp: 18 18  Temp: 98.1 F (36.7 C) 98.4 F (36.9 C)  SpO2: 98% 96%    Intake/Output Summary (Last 24 hours) at 04/04/2021 1443 Last data filed at 04/04/2021 1159 Gross per 24 hour  Intake --  Output 1100 ml  Net -1100 ml   Filed Weights   04/02/21 1411  Weight: 90 kg    ROS: Review of Systems  Respiratory:  Positive for cough. Negative for shortness of breath.   Cardiovascular:  Negative for chest pain.  Gastrointestinal:  Negative for abdominal pain.  Exam: Physical Exam HENT:     Head: Normocephalic.     Mouth/Throat:     Pharynx: No oropharyngeal exudate.  Eyes:     General: Lids are normal.     Conjunctiva/sclera: Conjunctivae normal.  Cardiovascular:     Rate and Rhythm: Normal rate and regular rhythm.     Heart sounds: Normal heart sounds, S1 normal and S2 normal.  Pulmonary:     Breath sounds: Examination of the right-middle field reveals wheezing. Examination of the left-middle field reveals wheezing. Examination of the right-lower field reveals decreased breath sounds and rhonchi. Examination of the left-lower field reveals decreased breath sounds and rhonchi. Decreased breath sounds, wheezing and rhonchi present. No rales.  Abdominal:     Palpations: Abdomen is soft.     Tenderness: There is no abdominal tenderness.  Musculoskeletal:     Right lower leg: Swelling present.     Left lower leg: Swelling present.  Skin:    General: Skin is  warm.     Findings: No rash.  Neurological:     Mental Status: He is alert.     Comments: Answers yes or no questions appropriately.  Able to straight leg raise.      Scheduled Meds:  amLODipine  5 mg Oral Daily   vitamin C  500 mg Oral Daily   cholecalciferol  1,000 Units Oral Daily   citalopram  40 mg Oral Daily   enoxaparin (LOVENOX) injection  40 mg Subcutaneous Q24H   fluPHENAZine  5 mg Oral Daily   Ipratropium-Albuterol  1 puff Inhalation QID   methylPREDNISolone (SOLU-MEDROL) injection  40 mg Intravenous Daily   multivitamin with minerals  1 tablet Oral Daily   nirmatrelvir/ritonavir EUA  3 tablet Oral BID   risperiDONE  0.5 mg Oral Daily   tamsulosin  0.4 mg Oral QPC breakfast   traZODone  25 mg Oral QHS   zinc sulfate  220 mg Oral Daily     Assessment/Plan:  COVID-19 infection with generalized weakness.  The patient lives in a group home and has a roommate, so will be difficult to isolate.  Will need to 10 days of isolation here in the hospital.  Patient on Paxlovid.  Started steroids with wheezing. Elevated D-dimer.  CT scan of the chest negative for PE and ultrasound negative for DVT. Schizophrenia, bipolar disorder and depression on psychiatric medications. Elevated liver function test likely  secondary to virus but hold simvastatin also. Hyperlipidemia unspecified.  Hold simvastatin while on Paxlovid BPH on Flomax Essential hypertension on Norvasc    Code Status:     Code Status Orders  (From admission, onward)           Start     Ordered   04/02/21 1747  Full code  Continuous        04/02/21 1747           Code Status History     Date Active Date Inactive Code Status Order ID Comments User Context   12/03/2020 2345 12/05/2020 1919 Full Code [id number]  Darlin Priestly, Ronald Inpatient   12/03/2020 2118 12/03/2020 2345 Full Code [id number]  Andris Baumann, Ronald Inpatient      Family Communication: Spoke with [name] Disposition Plan: Status is:  Inpatient  Dispo:  Patient From: Group Home  Planned Disposition: Group Home on 04/13/2021  Medically stable for discharge: No, still in COVID isolation   Antibiotics: Paxlovid  Time spent: 26 minutes  [name] [employer]

## 2021-04-05 DIAGNOSIS — F200 Paranoid schizophrenia: Secondary | ICD-10-CM | POA: Diagnosis not present

## 2021-04-05 DIAGNOSIS — R531 Weakness: Secondary | ICD-10-CM | POA: Diagnosis not present

## 2021-04-05 DIAGNOSIS — U071 COVID-19: Secondary | ICD-10-CM | POA: Diagnosis not present

## 2021-04-05 DIAGNOSIS — R7989 Other specified abnormal findings of blood chemistry: Secondary | ICD-10-CM | POA: Diagnosis not present

## 2021-04-05 LAB — COMPREHENSIVE METABOLIC PANEL
ALT: 44 U/L (ref 0–44)
AST: 121 U/L — ABNORMAL HIGH (ref 15–41)
Albumin: 3.5 g/dL (ref 3.5–5.0)
Alkaline Phosphatase: 65 U/L (ref 38–126)
Anion gap: 7 (ref 5–15)
BUN: 28 mg/dL — ABNORMAL HIGH (ref 8–23)
CO2: 25 mmol/L (ref 22–32)
Calcium: 9.2 mg/dL (ref 8.9–10.3)
Chloride: 100 mmol/L (ref 98–111)
Creatinine, Ser: 0.93 mg/dL (ref 0.61–1.24)
GFR, Estimated: >60 mL/min (ref >60)
Glucose, Bld: 115 mg/dL — ABNORMAL HIGH (ref 70–99)
Potassium: 4.6 mmol/L (ref 3.5–5.1)
Sodium: 132 mmol/L — ABNORMAL LOW (ref 135–145)
Total Bilirubin: 0.8 mg/dL (ref 0.3–1.2)
Total Protein: 7 g/dL (ref 6.5–8.1)

## 2021-04-05 NOTE — Progress Notes (Signed)
Patient ID: [name], male   DOB: [birth year], 71 y.o.   MRN: [id number] [facility] PROGRESS NOTE  [name] MBW:[id number] DOB: [birth year] DOA: 04/02/2021 PCP: Ronald Bound, NP  HPI/Subjective: Patient sent in from group home secondary to weakness.  Found to be COVID-positive.  Patient has some cough.  No shortness of breath.  Objective: Vitals:   04/05/21 0815 04/05/21 1154  BP: 122/76 112/72  Pulse: 88 71  Resp: 18 16  Temp: 98 F (36.7 C) 98.6 F (37 C)  SpO2: 98% 100%    Intake/Output Summary (Last 24 hours) at 04/05/2021 1440 Last data filed at 04/05/2021 1300 Gross per 24 hour  Intake --  Output 1900 ml  Net -1900 ml   Filed Weights   04/02/21 1411  Weight: 90 kg    ROS: Review of Systems  Respiratory:  Positive for cough. Negative for shortness of breath.   Cardiovascular:  Negative for chest pain.  Gastrointestinal:  Negative for abdominal pain, nausea and vomiting.  Exam: Physical Exam HENT:     Head: Normocephalic.     Mouth/Throat:     Pharynx: No oropharyngeal exudate.  Eyes:     General: Lids are normal.     Conjunctiva/sclera: Conjunctivae normal.  Cardiovascular:     Rate and Rhythm: Normal rate and regular rhythm.     Heart sounds: Normal heart sounds, S1 normal and S2 normal.  Pulmonary:     Breath sounds: Examination of the right-lower field reveals decreased breath sounds and rhonchi. Examination of the left-lower field reveals decreased breath sounds and rhonchi. Decreased breath sounds and rhonchi present. No wheezing or rales.  Abdominal:     Palpations: Abdomen is soft.     Tenderness: There is no abdominal tenderness.  Musculoskeletal:     Right lower leg: No swelling.     Left lower leg: No swelling.  Skin:    General: Skin is warm.     Findings: No rash.  Neurological:     Mental Status: He is alert and oriented to person, place, and time.      Scheduled Meds:  amLODipine  5 mg Oral Daily   vitamin C  500 mg  Oral Daily   cholecalciferol  1,000 Units Oral Daily   citalopram  40 mg Oral Daily   enoxaparin (LOVENOX) injection  40 mg Subcutaneous Q24H   fluPHENAZine  5 mg Oral Daily   Ipratropium-Albuterol  1 puff Inhalation QID   methylPREDNISolone (SOLU-MEDROL) injection  40 mg Intravenous Daily   multivitamin with minerals  1 tablet Oral Daily   nirmatrelvir/ritonavir EUA  3 tablet Oral BID   risperiDONE  0.5 mg Oral Daily   tamsulosin  0.4 mg Oral QPC breakfast   traZODone  25 mg Oral QHS   zinc sulfate  220 mg Oral Daily     Assessment/Plan:  COVID-19 infection with generalized weakness.  Continue Paxlovid treatment.  The patient has a roommate so will be difficult to isolate.  We will have to be here for 10 days.  Continue Solu-Medrol with wheezing.  Physical therapy recommending home with home health Elevated D-dimer.  CT scan of the chest negative for PE and ultrasound negative for DVT Schizophrenia, bipolar disorder and depression on psychiatric medications Elevated liver function test likely secondary to COVID-19 virus infection.  Holding simvastatin.  AST down from 269 down to 121.  ALT in the normal range. Hyperlipidemia unspecified.  Holding simvastatin while on Paxlovid BPH on Flomax Essential hypertension on Norvasc  Code Status:     Code Status Orders  (From admission, onward)           Start     Ordered   04/02/21 1747  Full code  Continuous        04/02/21 1747           Code Status History     Date Active Date Inactive Code Status Order ID Comments User Context   12/03/2020 2345 12/05/2020 1919 Full Code [id number]  Darlin Priestly, MD Inpatient   12/03/2020 2118 12/03/2020 2345 Full Code [id number]  Andris Baumann, MD Inpatient      Family Communication: Spoke with group home owner yesterday Disposition Plan: Status is: Inpatient  Dispo:  Patient From: Group Home  Planned Disposition: Group Home  Medically stable for discharge: No, currently in  COVID isolation   Time spent: 26 minutes  [name] [employer]

## 2021-04-06 DIAGNOSIS — D696 Thrombocytopenia, unspecified: Secondary | ICD-10-CM

## 2021-04-06 DIAGNOSIS — U071 COVID-19: Secondary | ICD-10-CM | POA: Diagnosis not present

## 2021-04-06 DIAGNOSIS — F200 Paranoid schizophrenia: Secondary | ICD-10-CM | POA: Diagnosis not present

## 2021-04-06 DIAGNOSIS — R7989 Other specified abnormal findings of blood chemistry: Secondary | ICD-10-CM | POA: Diagnosis not present

## 2021-04-06 DIAGNOSIS — R531 Weakness: Secondary | ICD-10-CM | POA: Diagnosis not present

## 2021-04-06 NOTE — Progress Notes (Signed)
Patient ID: [name], male   DOB: 11/29/1949, 70 y.o.   MRN: [id number] [facility] PROGRESS NOTE  Darran Gabay [id number]:[id number] DOB: 1949/11/30 DOA: 04/02/2021 PCP: Koren Bound, NP  HPI/Subjective: Patient feels okay.  Some cough.  No shortness of breath.  Admitted with weakness and COVID-19 infection.  Objective: Vitals:   04/06/21 0829 04/06/21 1233  BP: 126/68 134/75  Pulse: 75 72  Resp: 16 16  Temp: 98.2 F (36.8 C) 98.4 F (36.9 C)  SpO2: 97% 96%    Intake/Output Summary (Last 24 hours) at 04/06/2021 1314 Last data filed at 04/06/2021 0730 Gross per 24 hour  Intake --  Output 3600 ml  Net -3600 ml   Filed Weights   04/02/21 1411  Weight: 90 kg    ROS: Review of Systems  Respiratory:  Positive for cough.   Cardiovascular:  Negative for chest pain.  Gastrointestinal:  Negative for abdominal pain, nausea and vomiting.  Exam: Physical Exam HENT:     Head: Normocephalic.     Mouth/Throat:     Pharynx: No oropharyngeal exudate.  Eyes:     General: Lids are normal.     Conjunctiva/sclera: Conjunctivae normal.  Cardiovascular:     Rate and Rhythm: Normal rate and regular rhythm.     Heart sounds: Normal heart sounds, S1 normal and S2 normal.  Pulmonary:     Breath sounds: Examination of the right-lower field reveals decreased breath sounds and wheezing. Examination of the left-lower field reveals decreased breath sounds and wheezing. Decreased breath sounds and wheezing present. No rhonchi or rales.  Abdominal:     Palpations: Abdomen is soft.     Tenderness: There is no abdominal tenderness.  Musculoskeletal:     Right lower leg: No swelling.     Left lower leg: No swelling.  Skin:    General: Skin is warm.     Findings: No rash.  Neurological:     Mental Status: He is alert.     Comments: Answers questions appropriately      Scheduled Meds:  amLODipine  5 mg Oral Daily   vitamin C  500 mg Oral Daily   cholecalciferol  1,000 Units Oral  Daily   citalopram  40 mg Oral Daily   enoxaparin (LOVENOX) injection  40 mg Subcutaneous Q24H   fluPHENAZine  5 mg Oral Daily   Ipratropium-Albuterol  1 puff Inhalation QID   methylPREDNISolone (SOLU-MEDROL) injection  40 mg Intravenous Daily   multivitamin with minerals  1 tablet Oral Daily   nirmatrelvir/ritonavir EUA  3 tablet Oral BID   risperiDONE  0.5 mg Oral Daily   tamsulosin  0.4 mg Oral QPC breakfast   traZODone  25 mg Oral QHS   zinc sulfate  220 mg Oral Daily     Assessment/Plan:  COVID-19 infection with generalized weakness.  Continue Paxlovid treatment.  Patient will complete 10 days of isolation here in the hospital.  He has a roommate back at the group home so will be difficult to isolate at his place of residence.  With the patient's wheeze, I will continue Solu-Medrol.  Physical therapy recommending home health. Elevated D-dimer.  CT scan of the chest negative for PE.  Ultrasound lower extremities negative for DVT. Schizophrenia, depression and bipolar disorder.  Continue psychiatric medications Elevated liver function test likely secondary to COVID-19 virus infection.  Holding simvastatin at this time.  AST down from 269 to 121.  Recheck CMP tomorrow. Hyperlipidemia unspecified.  Holding simvastatin while on the Paxlovid  BPH on Flomax Essential hypertension on Norvasc Chronic thrombocytopenia.  Check hepatitis C.        Code Status:     Code Status Orders  (From admission, onward)           Start     Ordered   04/02/21 1747  Full code  Continuous        04/02/21 1747           Code Status History     Date Active Date Inactive Code Status Order ID Comments User Context   12/03/2020 2345 12/05/2020 1919 Full Code [id number]  Darlin Priestly, MD Inpatient   12/03/2020 2118 12/03/2020 2345 Full Code [id number]  Andris Baumann, MD Inpatient      Family Communication: Spoke with group home owner Mr. Rouch. Disposition Plan: Status is:  Inpatient  Dispo:  Patient From: Group Home  Planned Disposition: Group Home  Medically stable for discharge: No, currently in COVID isolation  Antibiotics: Paxlovid  Time spent: 25 minutes  [employer]

## 2021-04-07 DIAGNOSIS — U071 COVID-19: Secondary | ICD-10-CM | POA: Diagnosis not present

## 2021-04-07 DIAGNOSIS — R062 Wheezing: Secondary | ICD-10-CM

## 2021-04-07 DIAGNOSIS — R2681 Unsteadiness on feet: Secondary | ICD-10-CM

## 2021-04-07 DIAGNOSIS — R531 Weakness: Secondary | ICD-10-CM | POA: Diagnosis not present

## 2021-04-07 LAB — COMPREHENSIVE METABOLIC PANEL
ALT: 27 U/L (ref 0–44)
AST: 47 U/L — ABNORMAL HIGH (ref 15–41)
Albumin: 3.3 g/dL — ABNORMAL LOW (ref 3.5–5.0)
Alkaline Phosphatase: 70 U/L (ref 38–126)
Anion gap: 7 (ref 5–15)
BUN: 31 mg/dL — ABNORMAL HIGH (ref 8–23)
CO2: 25 mmol/L (ref 22–32)
Calcium: 9.2 mg/dL (ref 8.9–10.3)
Chloride: 98 mmol/L (ref 98–111)
Creatinine, Ser: 0.92 mg/dL (ref 0.61–1.24)
GFR, Estimated: >60 mL/min (ref >60)
Glucose, Bld: 113 mg/dL — ABNORMAL HIGH (ref 70–99)
Potassium: 5.1 mmol/L (ref 3.5–5.1)
Sodium: 130 mmol/L — ABNORMAL LOW (ref 135–145)
Total Bilirubin: 1.1 mg/dL (ref 0.3–1.2)
Total Protein: 6.6 g/dL (ref 6.5–8.1)

## 2021-04-07 LAB — CBC
HCT: 47.7 % (ref 39.0–52.0)
Hemoglobin: 16.1 g/dL (ref 13.0–17.0)
MCH: 30 pg (ref 26.0–34.0)
MCHC: 33.8 g/dL (ref 30.0–36.0)
MCV: 88.8 fL (ref 80.0–100.0)
Platelets: 104 10*3/uL — ABNORMAL LOW (ref 150–400)
RBC: 5.37 MIL/uL (ref 4.22–5.81)
RDW: 13.6 % (ref 11.5–15.5)
WBC: 13.5 10*3/uL — ABNORMAL HIGH (ref 4.0–10.5)
nRBC: 0 % (ref 0.0–0.2)

## 2021-04-07 LAB — HEPATITIS C ANTIBODY: HCV Ab: NONREACTIVE

## 2021-04-07 LAB — CULTURE, BLOOD (ROUTINE X 2)
Culture: NO GROWTH
Culture: NO GROWTH
Special Requests: ADEQUATE

## 2021-04-07 MED ORDER — PREDNISONE 20 MG PO TABS
30.0000 mg | ORAL_TABLET | Freq: Every day | ORAL | Status: DC
  Administered 2021-04-08: 30 mg via ORAL
  Filled 2021-04-07: qty 1

## 2021-04-07 NOTE — Progress Notes (Addendum)
Patient ID: [name], male   DOB: 1950-03-07, 69 y.o.   MRN: [id number] [facility] PROGRESS NOTE  [name] [id number] DOB: 06/25/1950 DOA: 04/02/2021 PCP: Ronald Bound, NP  HPI/Subjective: Patient answers yes or no questions appropriately.  Some cough.  No shortness of breath.  Came in with weakness and found to have COVID-19 infection.  Patient thinks his strength is getting better.  Physical therapy noted a choppy gait pattern with limited heel strike/toe off.  Objective: Vitals:   04/07/21 0735 04/07/21 1201  BP: 132/66 (!) 121/91  Pulse: 70 85  Resp: 18 16  Temp: 98.2 F (36.8 C) 99.1 F (37.3 C)  SpO2: 96% 96%    Intake/Output Summary (Last 24 hours) at 04/07/2021 1554 Last data filed at 04/07/2021 1224 Gross per 24 hour  Intake --  Output 2150 ml  Net -2150 ml   Filed Weights   04/02/21 1411  Weight: 90 kg    ROS: Review of Systems  Respiratory:  Positive for cough. Negative for shortness of breath.   Cardiovascular:  Negative for chest pain.  Gastrointestinal:  Negative for abdominal pain.  Exam: Physical Exam HENT:     Head: Normocephalic.     Mouth/Throat:     Pharynx: No oropharyngeal exudate.  Eyes:     General: Lids are normal.     Conjunctiva/sclera: Conjunctivae normal.     Pupils: Pupils are equal, round, and reactive to light.  Cardiovascular:     Rate and Rhythm: Normal rate and regular rhythm.     Heart sounds: Normal heart sounds, S1 normal and S2 normal.  Pulmonary:     Breath sounds: Examination of the right-lower field reveals decreased breath sounds. Examination of the left-lower field reveals decreased breath sounds. Decreased breath sounds present. No wheezing, rhonchi or rales.  Abdominal:     Palpations: Abdomen is soft.     Tenderness: There is no abdominal tenderness.  Musculoskeletal:     Right lower leg: No swelling.     Left lower leg: No swelling.  Skin:    General: Skin is warm.     Findings: No rash.   Neurological:     Mental Status: He is alert.     Comments: Answer some yes or no questions appropriately.  Able to straight leg raise.      Scheduled Meds:  amLODipine  5 mg Oral Daily   vitamin C  500 mg Oral Daily   cholecalciferol  1,000 Units Oral Daily   citalopram  40 mg Oral Daily   enoxaparin (LOVENOX) injection  40 mg Subcutaneous Q24H   fluPHENAZine  5 mg Oral Daily   Ipratropium-Albuterol  1 puff Inhalation QID   methylPREDNISolone (SOLU-MEDROL) injection  40 mg Intravenous Daily   multivitamin with minerals  1 tablet Oral Daily   nirmatrelvir/ritonavir EUA  3 tablet Oral BID   risperiDONE  0.5 mg Oral Daily   tamsulosin  0.4 mg Oral QPC breakfast   traZODone  25 mg Oral QHS   zinc sulfate  220 mg Oral Daily   Assessment/Plan:  COVID-19 infection with wheezing. Continue Paxlovid treatment.  Solu-Medrol today and switch over to prednisone for tomorrow.  The patient will complete 10 days of isolation here in the hospital because he has a roommate back at the group home and will be difficult to isolate at his place of residence.  Not a candidate to go out to rehab because all the rehab facilities also require 10 days of isolation here  in the hospital prior to going out to a rehab.  Check a pulse ox with ambulation on room air. Choppy gait pattern with physical therapy.  Will MRI of the brain to rule out stroke.  Continue working with PT and OT. Elevated D-dimer.  Imaging tests including CT scan of the chest negative for pulmonary embolism.  Ultrasound of the lower extremities negative for DVT. Schizophrenia, bipolar disorder and depression.  Continue psychiatric medications Elevated liver function test likely secondary to COVID-19 virus infection.  Continue to hold simvastatin at this time.  AST was 269 on presentation and down to 47.  ALT at its highest point was 52 and now down to 27. Hyperlipidemia unspecified.  Continue to hold simvastatin while on Paxlovid BPH.   Continue Flomax Essential hypertension.  Continue Norvasc Chronic thrombocytopenia.  Hepatitis C negative. Relative hyperkalemia today.  Repeat BMP tomorrow morning.        Code Status:     Code Status Orders  (From admission, onward)           Start     Ordered   04/02/21 1747  Full code  Continuous        04/02/21 1747           Code Status History     Date Active Date Inactive Code Status Order ID Comments User Context   12/03/2020 2345 12/05/2020 1919 Full Code [id number]  Darlin Priestly, MD Inpatient   12/03/2020 2118 12/03/2020 2345 Full Code [id number]  Andris Baumann, MD Inpatient      Family Communication: Updated group home owner yesterday. Disposition Plan: Status is: Inpatient  Dispo:  Patient From: Group Home  Planned Disposition: Group Home  Medically stable for discharge: No, still in COVID isolation.  Not a candidate for rehab (still would need to be in COVID isolation here in the hospital for 10 day for rehab also)   Time spent: 27 minutes  Kamariya [employer]

## 2021-04-07 NOTE — Progress Notes (Signed)
Occupational Therapy Treatment Patient Details Name: [name] MRN: [id number] DOB: July 26, 1949 Today's Date: 04/07/2021   History of present illness [name] is a 70 y.o. male with medical history significant for bipolar disorder, paranoid schizophrenia, hypertension, hyperlipidemia, who presented to [facility] ED from group home due to generalized weakness noted on the day of presentation associated with inability to walk and diffuse body aches mostly in his lower back. Screening test for COVID-19 positive on 04/02/21 in the ED. Chest x-ray showing mild bibasilar atelectasis. Not hypoxic: O2 saturation 95% on room air.   OT comments  [name] is much more alert, oriented, and steady than when seen in ED last week. Today he is able to complete bed mobility and transfers with SUPV-CGA, can maintain good standing balance and march in place with RW, good-fair standing balance and reaching w/o UE support on RW. Continue with POC, anticipate that [name] will be able to ambulate sufficiently to return to his group home once he no longer requires isolation.   Recommendations for follow up therapy are one component of a multi-disciplinary discharge planning process, led by the attending physician.  Recommendations may be updated based on patient status, additional functional criteria and insurance authorization.    Follow Up Recommendations  Home health OT    Equipment Recommendations  None recommended by OT    Recommendations for Other Services      Precautions / Restrictions Precautions Precautions: Fall Restrictions Weight Bearing Restrictions: No       Mobility Bed Mobility Overal bed mobility: Needs Assistance Bed Mobility: Supine to Sit;Sit to Supine     Supine to sit: Supervision Sit to supine: Supervision   General bed mobility comments: extra time, effort for bed mobility, but pt able to complete w/o physical A    Transfers Overall transfer level: Needs  assistance Equipment used: Rolling walker (2 wheeled) Transfers: Sit to/from Stand Sit to Stand: Min guard         General transfer comment: good use of handle placement on RW, safe transfer technique    Balance Overall balance assessment: Needs assistance Sitting-balance support: No upper extremity supported;Feet supported Sitting balance-Leahy Scale: Good     Standing balance support: Bilateral upper extremity supported;No upper extremity supported;Single extremity supported Standing balance-Leahy Scale: Good Standing balance comment: pt able to maintain good standing balance while reaching, no support on RW                           ADL either performed or assessed with clinical judgement   ADL                                               Vision       Perception     Praxis      Cognition Arousal/Alertness: Awake/alert Behavior During Therapy: WFL for tasks assessed/performed Overall Cognitive Status: No family/caregiver present to determine baseline cognitive functioning                                 General Comments: Able to stay he is at hospital and to give hospital name, able to give year as 2022 and to follow simple directions        Exercises Other Exercises Other Exercises: Bed  mobility, transfers, standing balance tolerance, UE therex in standing   Shoulder Instructions       General Comments      Pertinent Vitals/ Pain       Pain Assessment: No/denies pain  Home Living                                          Prior Functioning/Environment              Frequency  Min 1X/week        Progress Toward Goals  OT Goals(current goals can now be found in the care plan section)  Progress towards OT goals: Progressing toward goals  Acute Rehab OT Goals Patient Stated Goal: to get back home OT Goal Formulation: With patient Time For Goal Achievement: 04/17/21 Potential to  Achieve Goals: Good  Plan Discharge plan remains appropriate;Frequency remains appropriate    Co-evaluation                 AM-PAC OT "6 Clicks" Daily Activity     Outcome Measure   Help from another person eating meals?: None Help from another person taking care of personal grooming?: A Little Help from another person toileting, which includes using toliet, bedpan, or urinal?: A Lot Help from another person bathing (including washing, rinsing, drying)?: A Little Help from another person to put on and taking off regular upper body clothing?: A Little Help from another person to put on and taking off regular lower body clothing?: A Lot 6 Click Score: 17    End of Session Equipment Utilized During Treatment: Rolling walker  OT Visit Diagnosis: Unsteadiness on feet (R26.81);Other symptoms and signs involving cognitive function   Activity Tolerance Patient tolerated treatment well   Patient Left in bed;with call bell/phone within reach;with bed alarm set   Nurse Communication          Time: 1610-9604 OT Time Calculation (min): 11 min  Charges: OT Treatments $Self Care/Home Management : 8-22 mins  Latina Craver, PhD, MS, OTR/L 04/07/21, 3:13 PM

## 2021-04-08 ENCOUNTER — Inpatient Hospital Stay: Payer: Medicare HMO

## 2021-04-08 DIAGNOSIS — R062 Wheezing: Secondary | ICD-10-CM | POA: Diagnosis not present

## 2021-04-08 DIAGNOSIS — U071 COVID-19: Secondary | ICD-10-CM | POA: Diagnosis not present

## 2021-04-08 DIAGNOSIS — R531 Weakness: Secondary | ICD-10-CM | POA: Diagnosis not present

## 2021-04-08 DIAGNOSIS — R2681 Unsteadiness on feet: Secondary | ICD-10-CM | POA: Diagnosis not present

## 2021-04-08 LAB — BASIC METABOLIC PANEL
Anion gap: 6 (ref 5–15)
BUN: 33 mg/dL — ABNORMAL HIGH (ref 8–23)
CO2: 28 mmol/L (ref 22–32)
Calcium: 9 mg/dL (ref 8.9–10.3)
Chloride: 100 mmol/L (ref 98–111)
Creatinine, Ser: 1 mg/dL (ref 0.61–1.24)
GFR, Estimated: >60 mL/min (ref >60)
Glucose, Bld: 81 mg/dL (ref 70–99)
Potassium: 4.4 mmol/L (ref 3.5–5.1)
Sodium: 134 mmol/L — ABNORMAL LOW (ref 135–145)

## 2021-04-08 IMAGING — MR MR HEAD W/O CM
8 of 10 series · 37 of 48 positions shown · non-contrast
Comparison: Brain MRI [DATE].

CLINICAL DATA: Mental status change, unknown cause. Additional
provided: Patient presenting with weakness, found to have [T5]
infection, concern for stroke.

EXAM:
MRI HEAD WITHOUT CONTRAST
TECHNIQUE: Multiplanar, multiecho pulse sequences of the brain and surrounding
structures were obtained without intravenous contrast.

[Series 5: ax dwi_tracew · axial · 3.0mm · 0.65mm/px · z∈[-101,+53]mm · 7 of 48 slices shown]
[im 1/48]
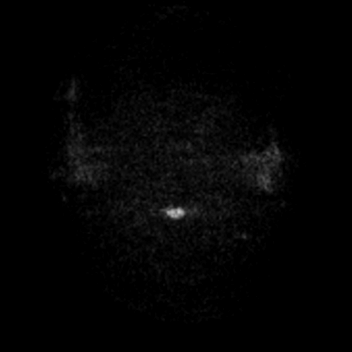
[im 8/48]
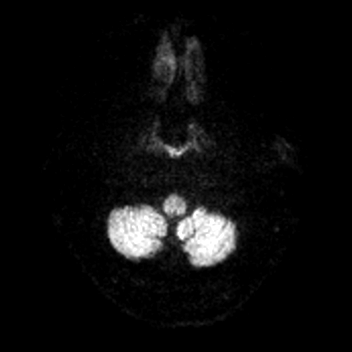
[im 16/48]
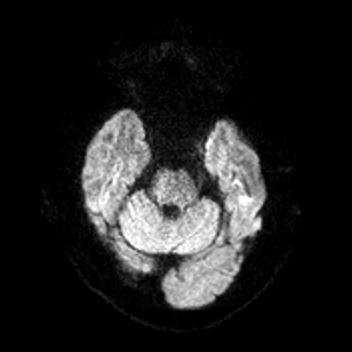
[im 24/48]
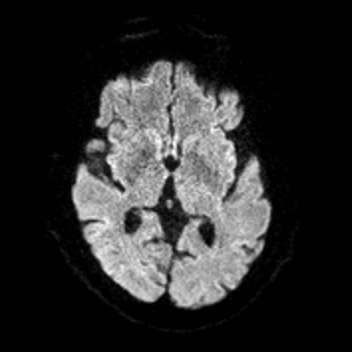
[im 32/48]
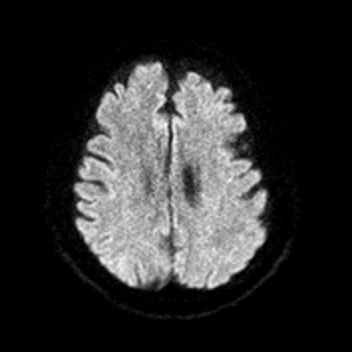
[im 40/48]
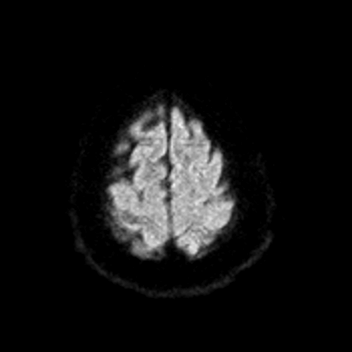
[im 48/48]
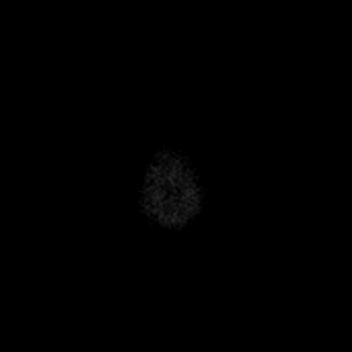

[Series 6: ax dwi_adc · axial · 3.0mm · 0.65mm/px · z∈[-101,+53]mm · 6 of 48 slices shown]
[im 1/48]
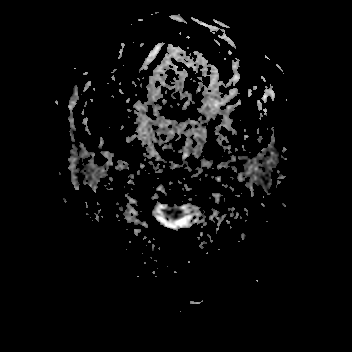
[im 10/48]
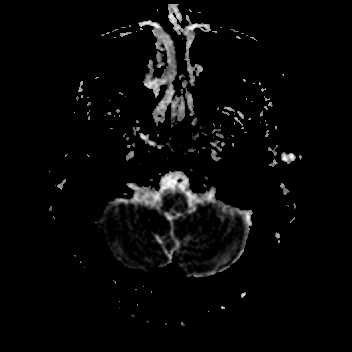
[im 19/48]
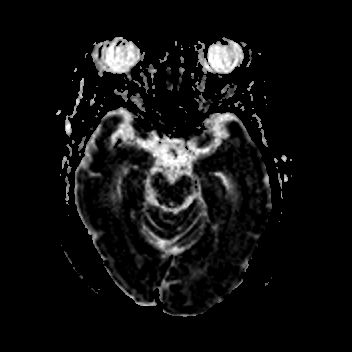
[im 29/48]
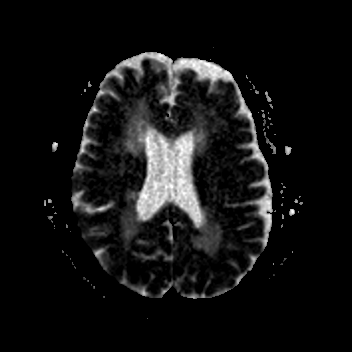
[im 38/48]
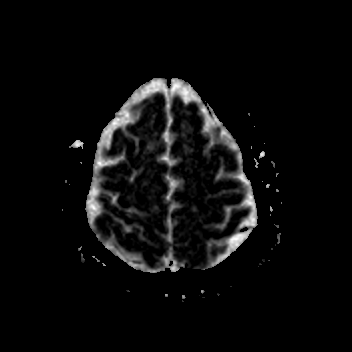
[im 48/48]
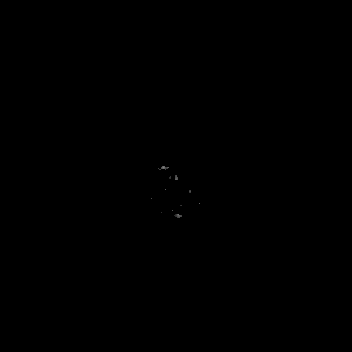

[Series 7: cor dwi_tracew · coronal · 5.0mm · 0.65mm/px · 3 of 40 slices shown]
[im 1/40]
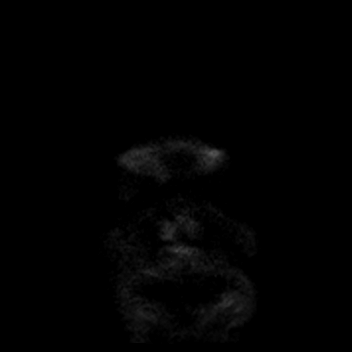
[im 10/40]
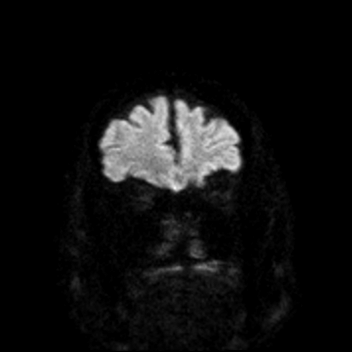
[im 20/40]
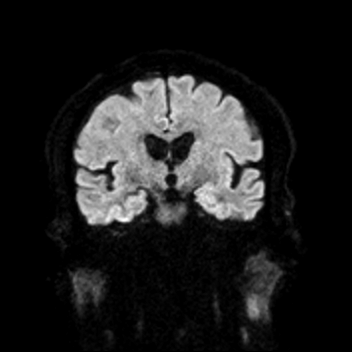

[Series 9: T1 · sagittal · 5.0mm · 0.62mm/px · 3 of 25 slices shown (1 of 2)]
[im 1/25]
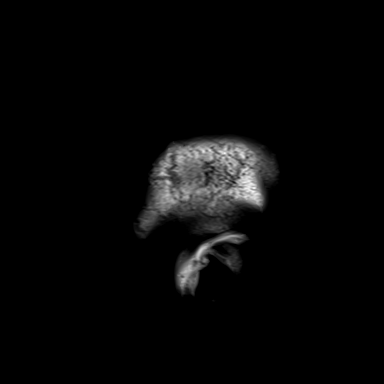
[im 13/25]
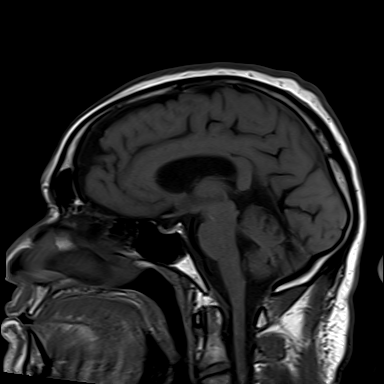
[im 25/25]
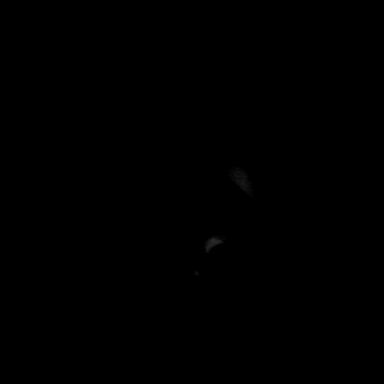

[Series 10: T2 · axial · 5.0mm · 0.53mm/px · z∈[-95,+48]mm · 3 of 25 slices shown (1 of 2)]
[im 1/25]
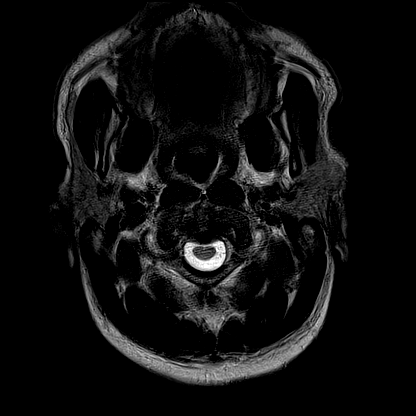
[im 13/25]
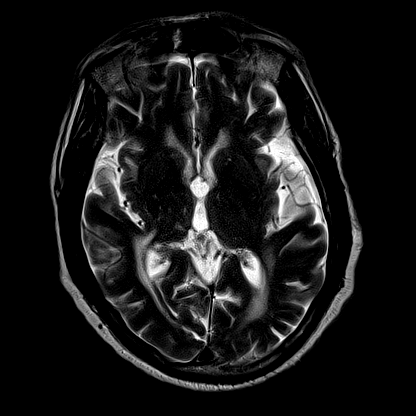
[im 25/25]
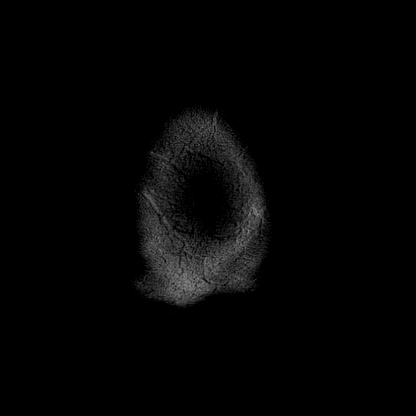

[Series 15: FLAIR · axial · 3.0mm · 0.69mm/px · z∈[-104,+57]mm · 7 of 55 slices shown]
[im 1/55]
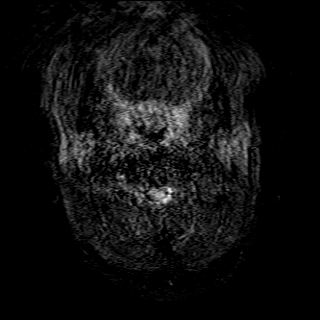
[im 10/55]
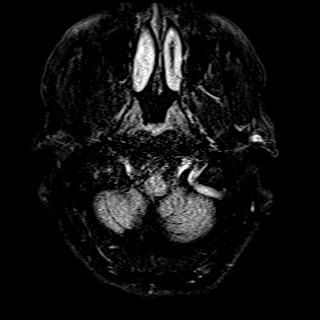
[im 19/55]
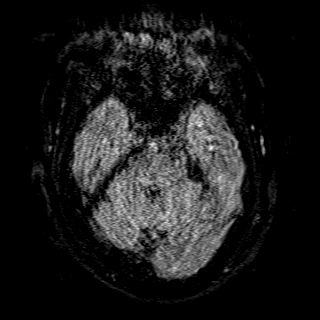
[im 28/55]
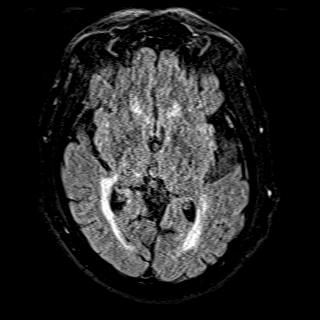
[im 37/55]
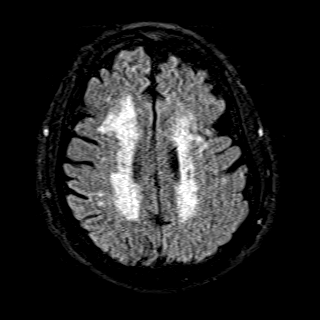
[im 46/55]
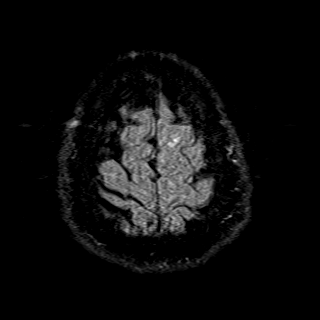
[im 55/55]
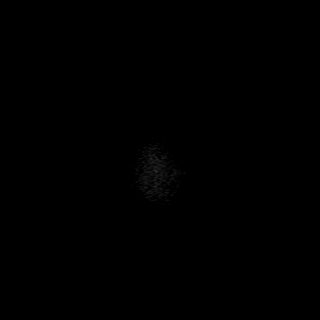

[Series 16: T1 · axial · 5.0mm · 0.90mm/px · z∈[-101,+54]mm · 4 of 27 slices shown (2 of 2)]
[im 1/27]
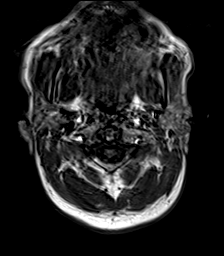
[im 9/27]
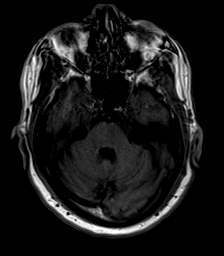
[im 18/27]
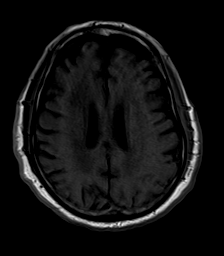
[im 27/27]
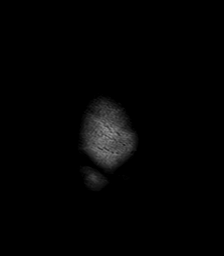

[Series 17: T2 · coronal · 5.0mm · 0.45mm/px · 4 of 31 slices shown (2 of 2)]
[im 1/31]
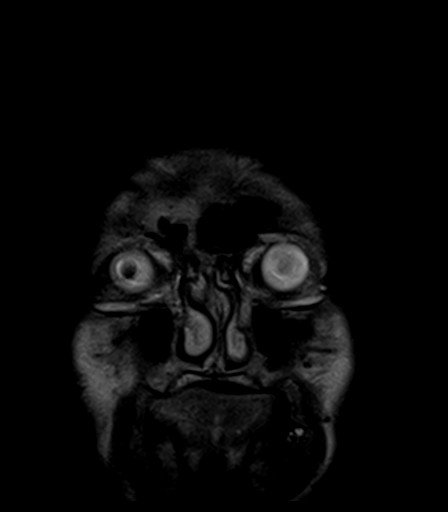
[im 11/31]
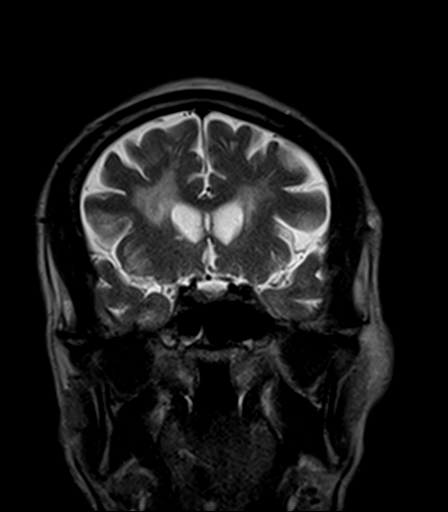
[im 21/31]
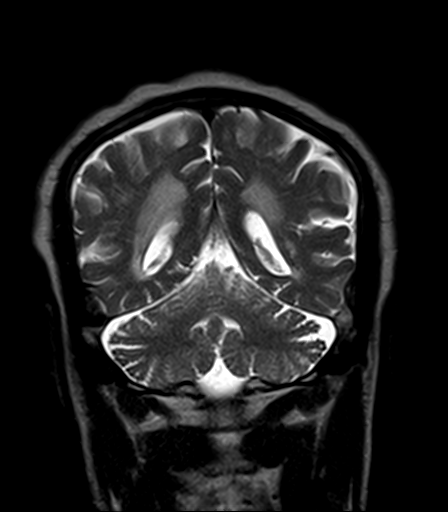
[im 31/31]
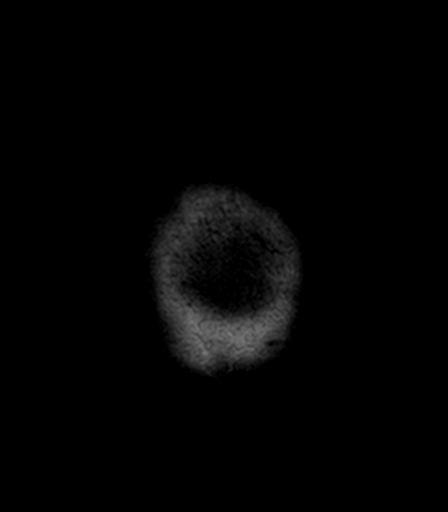

[37 of 48 positions shown; findings below may reference images not displayed]

FINDINGS: Brain:

Intermittently motion degraded examination, limiting evaluation.
Most notably, there is moderate/severe motion degradation of the
axial T2 TSE sequence, moderate/severe motion degradation of the
axial T2 FLAIR sequence and moderate motion degradation of the axial
T1 weighted sequence.

Mild generalized cerebral and cerebellar atrophy.

Severe patchy and confluent T2/FLAIR hyperintense signal abnormality
within the cerebral white matter, nonspecific but compatible with
chronic small vessel ischemic disease. Redemonstrated chronic
lacunar infarcts within the right corona radiata/basal ganglia and
right thalamus.

There is no acute infarct.

No evidence of an intracranial mass.

No chronic intracranial blood products.

No extra-axial fluid collection.

No midline shift.

Vascular: Maintained flow voids within the proximal large arterial
vessels.

Skull and upper cervical spine: No focal suspicious marrow lesion.

Sinuses/Orbits: Visualized orbits show no acute finding. Trace
mucosal thickening within the right sphenoid sinus.
IMPRESSION: Motion degraded examination, as described and limiting evaluation.

No evidence of acute intracranial abnormality.

As before, there are severe chronic small-vessel ischemic changes
within the cerebral white matter, with chronic lacunar infarcts in
the right corona radiata/basal ganglia and right thalamus.

Mild generalized parenchymal atrophy.

## 2021-04-08 MED ORDER — PREDNISONE 20 MG PO TABS
20.0000 mg | ORAL_TABLET | Freq: Every day | ORAL | Status: DC
  Administered 2021-04-09 – 2021-04-11 (×3): 20 mg via ORAL
  Filled 2021-04-08 (×3): qty 1

## 2021-04-08 NOTE — Progress Notes (Signed)
Patient ID: [name], male   DOB: March 16, 1950, 70 y.o.   MRN: [id number] [facility] PROGRESS NOTE  Travon Crochet [id number] DOB: 04/13/1950 DOA: 04/02/2021 PCP: Koren Bound, NP  HPI/Subjective: Patient feels okay.  Some cough but no shortness of breath.  Admitted with weakness and found to have COVID-19 infection.  Completed Paxlovid treatment  Objective: Vitals:   04/08/21 1159 04/08/21 1612  BP: 114/77 127/78  Pulse: 93 89  Resp: 18 20  Temp: 98.5 F (36.9 C) 99 F (37.2 C)  SpO2: 100% 100%    Intake/Output Summary (Last 24 hours) at 04/08/2021 1712 Last data filed at 04/08/2021 1612 Gross per 24 hour  Intake --  Output 3050 ml  Net -3050 ml   Filed Weights   04/02/21 1411  Weight: 90 kg    ROS: Review of Systems  Respiratory:  Positive for cough. Negative for shortness of breath.   Cardiovascular:  Negative for chest pain.  Gastrointestinal:  Negative for abdominal pain, nausea and vomiting.  Exam: Physical Exam HENT:     Head: Normocephalic.     Mouth/Throat:     Pharynx: No oropharyngeal exudate.  Eyes:     General: Lids are normal.     Conjunctiva/sclera: Conjunctivae normal.  Cardiovascular:     Rate and Rhythm: Normal rate and regular rhythm.     Heart sounds: Normal heart sounds, S1 normal and S2 normal.  Pulmonary:     Breath sounds: Examination of the right-lower field reveals decreased breath sounds and rhonchi. Examination of the left-lower field reveals decreased breath sounds and rhonchi. Decreased breath sounds and rhonchi present. No wheezing or rales.  Abdominal:     Palpations: Abdomen is soft.     Tenderness: There is no abdominal tenderness.  Musculoskeletal:     Right lower leg: No swelling.     Left lower leg: No swelling.  Skin:    General: Skin is warm.     Findings: No rash.  Neurological:     Mental Status: He is alert and oriented to person, place, and time.      Scheduled Meds:  amLODipine  5 mg Oral Daily    vitamin C  500 mg Oral Daily   cholecalciferol  1,000 Units Oral Daily   citalopram  40 mg Oral Daily   enoxaparin (LOVENOX) injection  40 mg Subcutaneous Q24H   fluPHENAZine  5 mg Oral Daily   Ipratropium-Albuterol  1 puff Inhalation QID   multivitamin with minerals  1 tablet Oral Daily   [START ON 04/09/2021] predniSONE  20 mg Oral Q breakfast   risperiDONE  0.5 mg Oral Daily   tamsulosin  0.4 mg Oral QPC breakfast   traZODone  25 mg Oral QHS   zinc sulfate  220 mg Oral Daily   Brief history 70 year old man with schizophrenia, depression and bipolar disorder sent in from the group home secondary to weakness.  He was found to be COVID-positive.  Patient treated with Paxlovid.  For the patient's wheeze he was given Solu-Medrol then prednisone.  Patient will have his 10-day isolation here because he does have a roommate at the group home.  Assessment/Plan:  COVID-19 infection with wheezing.  Completed Paxlovid treatment.  Continue to taper prednisone.  Will require 10 days of isolation here in the hospital because he has a roommate back in the group home and will be difficult to isolate at his place of residence. Choppy gait pattern with physical therapy.  MRI showed old strokes but  nothing acute.  Continue working with PT and OT.  Has aspirin allergy.  Physical therapy recommending home health Elevated D-dimer.  Imaging tests including CT scan of the chest was negative for pulmonary embolism and ultrasound the lower extremity negative for DVT. Schizophrenia, bipolar disorder and depression.  Continue psychiatric medications Elevated liver enzymes secondary to COVID-19 virus infection.  Holding simvastatin at this time.  AST 269 on presentation down to 47.  ALT 52 at its highest point and down to 27. Hyperlipidemia unspecified.  Holding simvastatin while on Paxlovid, can consider restarting upon discharge BPH on Flomax Essential hypertension on Norvasc Chronic thrombocytopenia.  Hepatitis C  negative Relative hyperkalemia yesterday.  Repeat BMP shows potassium in the normal range.        Code Status:     Code Status Orders  (From admission, onward)           Start     Ordered   04/02/21 1747  Full code  Continuous        04/02/21 1747           Code Status History     Date Active Date Inactive Code Status Order ID Comments User Context   12/03/2020 2345 12/05/2020 1919 Full Code [id number]  Darlin Priestly, MD Inpatient   12/03/2020 2118 12/03/2020 2345 Full Code [id number]  Andris Baumann, MD Inpatient      Family Communication: Spoke with group home owner Disposition Plan: Status is: Inpatient    Dispo:  Patient From: Group Home  Planned Disposition: Group Home on 04/13/2021  Medically stable for discharge: No, currently in COVID isolation   Time spent: 26 minutes  [employer]

## 2021-04-08 NOTE — Progress Notes (Signed)
Physical Therapy Treatment Patient Details Name: [name] MRN: [id number] DOB: [birth year] Today's Date: 04/08/2021   History of Present Illness [name] is a 70 y.o. male with medical history significant for bipolar disorder, paranoid schizophrenia, hypertension, hyperlipidemia, who presented to [facility] ED from group home due to generalized weakness noted on the day of presentation associated with inability to walk and diffuse body aches mostly in his lower back. Screening test for COVID-19 positive on 04/02/21 in the ED. Chest x-ray showing mild bibasilar atelectasis. Not hypoxic: O2 saturation 95% on room air.    PT Comments    Continues to mobilize with RW at cga/min assist level of care; consistent cuing for safety. Improved dynamic standing balance; does require at least unilateral UE support for balance with all functional activities. Sats >90% on RA at rest and with activity. Limited carry-over of new information/education between repetitions and sessions due to baseline cognitive deficits.  Anticipate optimal performance and progress once patient returns to familiar environment, routine (supplemented by [facility] services) when ready for discharge.    Recommendations for follow up therapy are one component of a multi-disciplinary discharge planning process, led by the attending physician.  Recommendations may be updated based on patient status, additional functional criteria and insurance authorization.  Follow Up Recommendations  Home health PT     Equipment Recommendations  Rolling walker with 5" wheels    Recommendations for Other Services       Precautions / Restrictions Precautions Precautions: Fall Restrictions Weight Bearing Restrictions: No     Mobility  Bed Mobility Overal bed mobility: Needs Assistance Bed Mobility: Supine to Sit;Sit to Supine   Sidelying to sit: Min assist Supine to sit: Supervision          Transfers Overall transfer level: Needs  assistance Equipment used: Rolling walker (2 wheeled) Transfers: Sit to/from Stand Sit to Stand: Min assist         General transfer comment: cuing for hand position (tends to pull on RW), min facilitation for anterior weight shift/forward trunk lean to move COG over BOS with sit/stand transition  Ambulation/Gait Ambulation/Gait assistance: Min guard;Supervision Gait Distance (Feet):  (45' x2) Assistive device: Rolling walker (2 wheeled)       General Gait Details: partially reciprocal stepping pattern with decreased stance time L LE; flat foot contact to L LE throughout gait cycle; limited step height/length bilat.  Consistent cuing for step length, but limited ability to integrate and carry-over cuing   Stairs             Wheelchair Mobility    Modified Rankin (Stroke Patients Only)       Balance Overall balance assessment: Needs assistance Sitting-balance support: No upper extremity supported;Feet supported Sitting balance-Leahy Scale: Good     Standing balance support: Bilateral upper extremity supported Standing balance-Leahy Scale: Fair                              Cognition Arousal/Alertness: Awake/alert Behavior During Therapy: WFL for tasks assessed/performed Overall Cognitive Status: No family/caregiver present to determine baseline cognitive functioning                                 General Comments: Oriented to self,location as hospital; follows simple commands with increased time for processing and initiation; constant direction for task sequencing, limited spontaneous movement/functional activity      Exercises Other  Exercises Other Exercises: Unsupported sitting, participated with upper and lower body bathing/dressing routine; set up/supervision for hygiene to face, chest, arms, LEs and peri-area, max/dep for hygiene to buttocks.  Cga/close sup for standing balance with RW. Other Exercises: Sit/stand from bed surface  with RW x5, cga/min assist-emphasis on mechanics and fluidity of movement.  Limited carry-over of cuing between repetitions    General Comments        Pertinent Vitals/Pain Pain Assessment: No/denies pain    Home Living                      Prior Function            PT Goals (current goals can now be found in the care plan section) Acute Rehab PT Goals Patient Stated Goal: to get back home PT Goal Formulation: With patient Time For Goal Achievement: 04/18/21 Potential to Achieve Goals: Good Progress towards PT goals: Progressing toward goals    Frequency           PT Plan Current plan remains appropriate    Co-evaluation              AM-PAC PT "6 Clicks" Mobility   Outcome Measure  Help needed turning from your back to your side while in a flat bed without using bedrails?: None Help needed moving from lying on your back to sitting on the side of a flat bed without using bedrails?: None Help needed moving to and from a bed to a chair (including a wheelchair)?: A Little Help needed standing up from a chair using your arms (e.g., wheelchair or bedside chair)?: A Little Help needed to walk in hospital room?: A Little Help needed climbing 3-5 steps with a railing? : A Little 6 Click Score: 20    End of Session Equipment Utilized During Treatment: Gait belt Activity Tolerance: Patient tolerated treatment well Patient left: in chair;with call bell/phone within reach;with chair alarm set Nurse Communication: Mobility status PT Visit Diagnosis: Muscle weakness (generalized) (M62.81);Difficulty in walking, not elsewhere classified (R26.2)     Time: 3557-3220 PT Time Calculation (min) (ACUTE ONLY): 29 min  Charges:  $Gait Training: 8-22 mins $Therapeutic Activity: 8-22 mins                     [name], PT, DPT, NCS 04/08/21, 4:42 PM [phone]

## 2021-04-09 DIAGNOSIS — U071 COVID-19: Secondary | ICD-10-CM | POA: Diagnosis not present

## 2021-04-09 DIAGNOSIS — I10 Essential (primary) hypertension: Secondary | ICD-10-CM | POA: Diagnosis not present

## 2021-04-09 DIAGNOSIS — D696 Thrombocytopenia, unspecified: Secondary | ICD-10-CM | POA: Diagnosis not present

## 2021-04-09 LAB — CREATININE, SERUM
Creatinine, Ser: 0.84 mg/dL (ref 0.61–1.24)
GFR, Estimated: >60 mL/min (ref >60)

## 2021-04-09 NOTE — Progress Notes (Signed)
PROGRESS NOTE    [name]  GGY:[id number] DOB: [birth year] DOA: 04/02/2021 PCP: Koren Bound, NP    Brief Narrative:    [name] is a 70 y.o. male with medical history significant for bipolar disorder, paranoid schizophrenia, hypertension, hyperlipidemia, who presented to [facility] ED from group home due to generalized weakness noted on the day of presentation associated with inability to walk and diffuse body aches mostly in his lower back.  He has a low-grade fever of 100.2.  He was COVID-positive.  He is treated with Paxil with and steroids.  He does not have hypoxemia.  He is pending for transfer to group home after quarantine of 10 days.  Transfer on 9/25.  Assessment & Plan:   Active Problems:   Essential hypertension   Schizophrenia (HCC)   Weakness   COVID-19 virus infection   Elevated LFTs   Thrombocytopenia (HCC)   Wheeze   Unsteady gait  #1.  COVID infection. Acute bronchitis with bronchospasm. Continue oral steroids, patient no longer has wheezing.  2.  Unsteady gait. Prior stroke. MRI of the brain showed small vessel disease with a prior lacunar infarcts.  No acute changes.  #3 essential hypertension. Chronic thrombocytopenia. Conditions are stable  4.  Hyponatremia. Stable    DVT prophylaxis: Lovenox Code Status: full Family Communication:  Disposition Plan:    Status is: Inpatient  Remains inpatient appropriate because:Unsafe d/c plan  Dispo:  Patient From: Group Home  Planned Disposition: Group Home  Medically stable for discharge: No          I/O last 3 completed shifts: In: -  Out: 3900 [Urine:3900] No intake/output data recorded.     Consultants:  None  Procedures: None  Antimicrobials: None Subjective: , Denies any short of breath or cough.  No hypoxia. No abdominal pain or nausea vomiting. No fever or chills. No dysuria hematuria.  Objective: Vitals:   04/08/21 2040 04/09/21 0423 04/09/21 0855 04/09/21 1111   BP: 128/75 112/72 (!) 143/77 135/70  Pulse: 83 76 88 92  Resp: 18 16 16 16   Temp: 98.7 F (37.1 C) 98.4 F (36.9 C) 98.2 F (36.8 C) 97.6 F (36.4 C)  TempSrc: Oral Oral Oral   SpO2: 96% 98% 94% 96%  Weight:      Height:        Intake/Output Summary (Last 24 hours) at 04/09/2021 1118 Last data filed at 04/09/2021 0424 Gross per 24 hour  Intake --  Output 1500 ml  Net -1500 ml   Filed Weights   04/02/21 1411  Weight: 90 kg    Examination:  General exam: Appears calm and comfortable  Respiratory system: Clear to auscultation. Respiratory effort normal. Cardiovascular system: S1 & S2 heard, RRR. No JVD, murmurs, rubs, gallops or clicks. No pedal edema. Gastrointestinal system: Abdomen is nondistended, soft and nontender. No organomegaly or masses felt. Normal bowel sounds heard. Central nervous system: Alert and oriented x2. No focal neurological deficits. Extremities: Symmetric 5 x 5 power. Skin: No rashes, lesions or ulcers Psychiatry: Judgement and insight appear normal. Mood & affect appropriate.     Data Reviewed: I have personally reviewed following labs and imaging studies  CBC: Recent Labs  Lab 04/02/21 1415 04/02/21 1811 04/03/21 0457 04/07/21 0532  WBC 9.8 9.2 7.5 13.5*  NEUTROABS 8.6*  --  5.7  --   HGB 14.7 14.3 14.7 16.1  HCT 42.9 41.6 43.4 47.7  MCV 90.9 91.4 91.4 88.8  PLT 113* 106* 104* 104*   Basic Metabolic  Panel: Recent Labs  Lab 04/03/21 0457 04/04/21 0728 04/05/21 0609 04/07/21 0532 04/08/21 0517 04/09/21 0458  NA 136 133* 132* 130* 134*  --   K 3.6 4.4 4.6 5.1 4.4  --   CL 104 101 100 98 100  --   CO2 25 25 25 25 28   --   GLUCOSE 77 106* 115* 113* 81  --   BUN 14 20 28* 31* 33*  --   CREATININE 0.90 0.94 0.93 0.92 1.00 0.84  CALCIUM 8.7* 9.2 9.2 9.2 9.0  --   MG 1.8  --   --   --   --   --   PHOS 3.4  --   --   --   --   --    GFR: Estimated Creatinine Clearance: 89.4 mL/min (by C-G formula based on SCr of 0.84  mg/dL). Liver Function Tests: Recent Labs  Lab 04/02/21 1415 04/03/21 0457 04/04/21 0728 04/05/21 0609 04/07/21 0532  AST 74* 269* 207* 121* 47*  ALT 19 48* 52* 44 27  ALKPHOS 70 66 66 65 70  BILITOT 0.8 0.8 0.7 0.8 1.1  PROT 7.6 6.9 7.5 7.0 6.6  ALBUMIN 3.8 3.5 3.8 3.5 3.3*   No results for input(s): LIPASE, AMYLASE in the last 168 hours. No results for input(s): AMMONIA in the last 168 hours. Coagulation Profile: Recent Labs  Lab 04/02/21 1415  INR 1.1   Cardiac Enzymes: No results for input(s): CKTOTAL, CKMB, CKMBINDEX, TROPONINI in the last 168 hours. BNP (last 3 results) No results for input(s): PROBNP in the last 8760 hours. HbA1C: No results for input(s): HGBA1C in the last 72 hours. CBG: No results for input(s): GLUCAP in the last 168 hours. Lipid Profile: No results for input(s): CHOL, HDL, LDLCALC, TRIG, CHOLHDL, LDLDIRECT in the last 72 hours. Thyroid Function Tests: No results for input(s): TSH, T4TOTAL, FREET4, T3FREE, THYROIDAB in the last 72 hours. Anemia Panel: No results for input(s): VITAMINB12, FOLATE, FERRITIN, TIBC, IRON, RETICCTPCT in the last 72 hours. Sepsis Labs: Recent Labs  Lab 04/02/21 1415  LATICACIDVEN 1.5    Recent Results (from the past 240 hour(s))  Culture, blood (Routine x 2)     Status: None   Collection Time: 04/02/21  2:15 PM   Specimen: BLOOD  Result Value Ref Range Status   Specimen Description BLOOD RIGHT ANTECUBITAL  Final   Special Requests   Final    BOTTLES DRAWN AEROBIC AND ANAEROBIC Blood Culture adequate volume   Culture   Final    NO GROWTH 5 DAYS Performed at [facility], [address], [place], [place]    Report Status 04/07/2021 FINAL  Final  Culture, blood (Routine x 2)     Status: None   Collection Time: 04/02/21  3:47 PM   Specimen: BLOOD  Result Value Ref Range Status   Specimen Description BLOOD BLOOD RIGHT HAND  Final   Special Requests   Final    BOTTLES DRAWN AEROBIC AND  ANAEROBIC Blood Culture results may not be optimal due to an inadequate volume of blood received in culture bottles   Culture   Final    NO GROWTH 5 DAYS Performed at [facility], [address] [place], [place]    Report Status 04/07/2021 FINAL  Final  Resp Panel by RT-PCR (Flu A&B, Covid) Nasopharyngeal Swab     Status: Abnormal   Collection Time: 04/02/21  3:47 PM   Specimen: Nasopharyngeal Swab; Nasopharyngeal(NP) swabs in vial  transport medium  Result Value Ref Range Status   SARS Coronavirus 2 by RT PCR POSITIVE (A) NEGATIVE Final    Comment: RESULT CALLED TO, READ BACK BY AND VERIFIED WITH: LINDSAY BLACK @1646  04/02/21 MJU (NOTE) SARS-CoV-2 target nucleic acids are DETECTED.  The SARS-CoV-2 RNA is generally detectable in upper respiratory specimens during the acute phase of infection. Positive results are indicative of the presence of the identified virus, but do not rule out bacterial infection or co-infection with other pathogens not detected by the test. Clinical correlation with patient history and other diagnostic information is necessary to determine patient infection status. The expected result is Negative.  Fact Sheet for Patients: 04/04/21  Fact Sheet for Healthcare Providers: BloggerCourse.com  This test is not yet approved or cleared by the [employer].[place] FDA and  has been authorized for detection and/or diagnosis of SARS-CoV-2 by FDA under an Emergency Use Authorization (EUA).  This EUA will remain in effect (meaning this test can be u sed) for the duration of  the COVID-19 declaration under Section 564(b)(1) of the Act, 21 U.S.C. section 360bbb-3(b)(1), unless the authorization is terminated or revoked sooner.     Influenza A by PCR NEGATIVE NEGATIVE Final   Influenza B by PCR NEGATIVE NEGATIVE Final    Comment: (NOTE) The Xpert Xpress SARS-CoV-2/FLU/RSV plus assay is intended as  an aid in the diagnosis of influenza from Nasopharyngeal swab specimens and should not be used as a sole basis for treatment. Nasal washings and aspirates are unacceptable for Xpert Xpress SARS-CoV-2/FLU/RSV testing.  Fact Sheet for Patients: [place]  Fact Sheet for Healthcare Providers: BloggerCourse.com  This test is not yet approved or cleared by the [employer]ious[employer]ker.[employer] FDA and has been authorized for detection and/or diagnosis of SARS-CoV-2 by FDA under an Emergency Use Authorization (EUA). This EUA will remain in effect (meaning this test can be used) for the duration of the COVID-19 declaration under Section 564(b)(1) of the Act, 21 U.S.C. section 360bbb-3(b)(1), unless the authorization is terminated or revoked.  Performed at [facility], [address], [place], [place]          Radiology Studies: MR BRAIN WO CONTRAST  Result Date: 04/08/2021 CLINICAL DATA:  Mental status change, unknown cause. Additional provided: Patient presenting with weakness, found to have COVID-19 infection, concern for stroke. EXAM: MRI HEAD WITHOUT CONTRAST TECHNIQUE: Multiplanar, multiecho pulse sequences of the brain and surrounding structures were obtained without intravenous contrast. COMPARISON:  Brain MRI 12/04/2020. FINDINGS: Brain: Intermittently motion degraded examination, limiting evaluation. Most notably, there is moderate/severe motion degradation of the axial T2 TSE sequence, moderate/severe motion degradation of the axial T2 FLAIR sequence and moderate motion degradation of the axial T1 weighted sequence. Mild generalized cerebral and cerebellar atrophy. Severe patchy and confluent T2/FLAIR hyperintense signal abnormality within the cerebral white matter, nonspecific but compatible with chronic small vessel ischemic disease. Redemonstrated chronic lacunar infarcts within the right corona radiata/basal ganglia and  right thalamus. There is no acute infarct. No evidence of an intracranial mass. No chronic intracranial blood products. No extra-axial fluid collection. No midline shift. Vascular: Maintained flow voids within the proximal large arterial vessels. Skull and upper cervical spine: No focal suspicious marrow lesion. Sinuses/Orbits: Visualized orbits show no acute finding. Trace mucosal thickening within the right sphenoid sinus. IMPRESSION: Motion degraded examination, as described and limiting evaluation. No evidence of acute intracranial abnormality. As before, there are severe chronic small-vessel ischemic changes within the cerebral white matter, with chronic lacunar infarcts in  the right corona radiata/basal ganglia and right thalamus. Mild generalized parenchymal atrophy. Electronically Signed   By: Jackey Loge D.O.   On: 04/08/2021 12:42        Scheduled Meds:  amLODipine  5 mg Oral Daily   vitamin C  500 mg Oral Daily   cholecalciferol  1,000 Units Oral Daily   citalopram  40 mg Oral Daily   enoxaparin (LOVENOX) injection  40 mg Subcutaneous Q24H   fluPHENAZine  5 mg Oral Daily   Ipratropium-Albuterol  1 puff Inhalation QID   multivitamin with minerals  1 tablet Oral Daily   predniSONE  20 mg Oral Q breakfast   risperiDONE  0.5 mg Oral Daily   tamsulosin  0.4 mg Oral QPC breakfast   traZODone  25 mg Oral QHS   zinc sulfate  220 mg Oral Daily   Continuous Infusions:   LOS: 7 days    Time spent: 28 minutes    Marrion Coy, MD [facility]   To contact the attending provider between 7A-7P or the covering provider during after hours 7P-7A, please log into the web site [email or web address] and access using universal [place] password for that web site. If you do not have the password, please call the hospital operator.  04/09/2021, 11:18 AM

## 2021-04-10 DIAGNOSIS — U071 COVID-19: Secondary | ICD-10-CM | POA: Diagnosis not present

## 2021-04-10 DIAGNOSIS — I10 Essential (primary) hypertension: Secondary | ICD-10-CM | POA: Diagnosis not present

## 2021-04-10 DIAGNOSIS — F200 Paranoid schizophrenia: Secondary | ICD-10-CM | POA: Diagnosis not present

## 2021-04-10 DIAGNOSIS — E871 Hypo-osmolality and hyponatremia: Secondary | ICD-10-CM

## 2021-04-10 MED ORDER — SENNOSIDES-DOCUSATE SODIUM 8.6-50 MG PO TABS
2.0000 | ORAL_TABLET | Freq: Two times a day (BID) | ORAL | Status: DC
  Administered 2021-04-10 – 2021-04-14 (×8): 2 via ORAL
  Filled 2021-04-10 (×8): qty 2

## 2021-04-10 NOTE — TOC Progression Note (Signed)
Transition of Care [facility]) - Progression Note    Patient Details  Name: [name] MRN: [id number] Date of Birth: 03/19/1950  [facility] CM/SW Contact  Allayne Butcher, RN Phone Number: 04/10/2021, 3:36 PM  Clinical Narrative:    RNCM attempted to reach out to Cephus Shelling - group home owner about discharge planning for Sunday.  Message left for return call.  TOC can arranged home health services.     Expected Discharge Plan: Home w Home Health Services Barriers to Discharge: Other (must enter comment) (COVID - group home)  Expected Discharge Plan and Services Expected Discharge Plan: Home w Home Health Services   Discharge Planning Services: CM Consult Post Acute Care Choice: Home Health Living arrangements for the past 2 months: Group Home                           HH Arranged: PT, OT           Social Determinants of Health (SDOH) Interventions    Readmission Risk Interventions No flowsheet data found.

## 2021-04-10 NOTE — TOC Progression Note (Signed)
Transition of Care [facility]) - Progression Note    Patient Details  Name: [name] MRN: [id number] Date of Birth: 26-Mar-1950  Transition of Care [facility]) CM/SW Contact  Allayne Butcher, RN Phone Number: 04/10/2021, 4:19 PM  Clinical Narrative:    [facility] referral given to [facility] with Frances Furbish.  He will review to see if they can accept.   Expected Discharge Plan: Home w Home Health Services Barriers to Discharge: Other (must enter comment) (COVID - group home)  Expected Discharge Plan and Services Expected Discharge Plan: Home w Home Health Services   Discharge Planning Services: CM Consult Post Acute Care Choice: Home Health Living arrangements for the past 2 months: Group Home                           HH Arranged: PT, OT           Social Determinants of Health (SDOH) Interventions    Readmission Risk Interventions No flowsheet data found.

## 2021-04-10 NOTE — Progress Notes (Signed)
PROGRESS NOTE    [name]  BLT:[id number] DOB: May 24, 1950 DOA: 04/02/2021 PCP: Koren Bound, NP    Brief Narrative:    [name] is a 70 y.o. male with medical history significant for bipolar disorder, paranoid schizophrenia, hypertension, hyperlipidemia, who presented to [facility] ED from group home due to generalized weakness noted on the day of presentation associated with inability to walk and diffuse body aches mostly in his lower back.  He has a low-grade fever of 100.2.  He was COVID-positive.  He is treated with Paxil with and steroids.  He does not have hypoxemia.  He is pending for transfer to group home after quarantine of 10 days.  Transfer on 9/25.  Assessment & Plan:   Active Problems:   Essential hypertension   Schizophrenia (HCC)   Weakness   COVID-19 virus infection   Elevated LFTs   Thrombocytopenia (HCC)   Wheeze   Unsteady gait  #1.  COVID infection. Acute bronchitis with bronchospasm. Condition had improved.  2.  Unsteady gait Prior strokes. Continue PT/OT.  3.  Essential hypertension. Continue home medicines per  4.  Chronic thrombocytopenia. Condition stable.   DVT prophylaxis: Lovenox Code Status: full Family Communication:  Disposition Plan:      Status is: Inpatient   Remains inpatient appropriate because:Unsafe d/c plan   Dispo:             Patient From: Group Home             Planned Disposition: Group Home             Medically stable for discharge: Yes                   I/O last 3 completed shifts: In: -  Out: 1800 [Urine:1800] No intake/output data recorded.     Consultants:  None  Procedures: None  Antimicrobials:None  Subjective: Patient doing well.  Off oxygen, no short of breath. No abdominal pain or nausea vomiting.  Last bowel movement recorded was 9/19.  We will start stool softener. No dysuria or hematuria. No headache or dizziness. No chest pain.  Objective: Vitals:   04/09/21 1630 04/09/21  2001 04/10/21 0522 04/10/21 0820  BP: 121/73 126/88 113/69 122/68  Pulse: 89 92 75 71  Resp: 18 18 16 16   Temp: 98.7 F (37.1 C) 98.3 F (36.8 C) 98.3 F (36.8 C) 98.2 F (36.8 C)  TempSrc: Oral  Oral   SpO2: 98% 95% 95% 95%  Weight:      Height:        Intake/Output Summary (Last 24 hours) at 04/10/2021 1100 Last data filed at 04/10/2021 0500 Gross per 24 hour  Intake --  Output 950 ml  Net -950 ml   Filed Weights   04/02/21 1411  Weight: 90 kg    Examination:  General exam: Appears calm and comfortable  Respiratory system: Clear to auscultation. Respiratory effort normal. Cardiovascular system: S1 & S2 heard, RRR. No JVD, murmurs, rubs, gallops or clicks. No pedal edema. Gastrointestinal system: Abdomen is nondistended, soft and nontender. No organomegaly or masses felt. Normal bowel sounds heard. Central nervous system: Alert and oriented x2. No focal neurological deficits. Extremities: Symmetric 5 x 5 power. Skin: No rashes, lesions or ulcers Psychiatry: Judgement and insight appear normal. Mood & affect appropriate.     Data Reviewed: I have personally reviewed following labs and imaging studies  CBC: Recent Labs  Lab 04/07/21 0532  WBC 13.5*  HGB 16.1  HCT  47.7  MCV 88.8  PLT 104*   Basic Metabolic Panel: Recent Labs  Lab 04/04/21 0728 04/05/21 0609 04/07/21 0532 04/08/21 0517 04/09/21 0458  NA 133* 132* 130* 134*  --   K 4.4 4.6 5.1 4.4  --   CL 101 100 98 100  --   CO2 25 25 25 28   --   GLUCOSE 106* 115* 113* 81  --   BUN 20 28* 31* 33*  --   CREATININE 0.94 0.93 0.92 1.00 0.84  CALCIUM 9.2 9.2 9.2 9.0  --    GFR: Estimated Creatinine Clearance: 89.4 mL/min (by C-G formula based on SCr of 0.84 mg/dL). Liver Function Tests: Recent Labs  Lab 04/04/21 0728 04/05/21 0609 04/07/21 0532  AST 207* 121* 47*  ALT 52* 44 27  ALKPHOS 66 65 70  BILITOT 0.7 0.8 1.1  PROT 7.5 7.0 6.6  ALBUMIN 3.8 3.5 3.3*   No results for input(s): LIPASE,  AMYLASE in the last 168 hours. No results for input(s): AMMONIA in the last 168 hours. Coagulation Profile: No results for input(s): INR, PROTIME in the last 168 hours. Cardiac Enzymes: No results for input(s): CKTOTAL, CKMB, CKMBINDEX, TROPONINI in the last 168 hours. BNP (last 3 results) No results for input(s): PROBNP in the last 8760 hours. HbA1C: No results for input(s): HGBA1C in the last 72 hours. CBG: No results for input(s): GLUCAP in the last 168 hours. Lipid Profile: No results for input(s): CHOL, HDL, LDLCALC, TRIG, CHOLHDL, LDLDIRECT in the last 72 hours. Thyroid Function Tests: No results for input(s): TSH, T4TOTAL, FREET4, T3FREE, THYROIDAB in the last 72 hours. Anemia Panel: No results for input(s): VITAMINB12, FOLATE, FERRITIN, TIBC, IRON, RETICCTPCT in the last 72 hours. Sepsis Labs: No results for input(s): PROCALCITON, LATICACIDVEN in the last 168 hours.  Recent Results (from the past 240 hour(s))  Culture, blood (Routine x 2)     Status: None   Collection Time: 04/02/21  2:15 PM   Specimen: BLOOD  Result Value Ref Range Status   Specimen Description BLOOD RIGHT ANTECUBITAL  Final   Special Requests   Final    BOTTLES DRAWN AEROBIC AND ANAEROBIC Blood Culture adequate volume   Culture   Final    NO GROWTH 5 DAYS Performed at [facility], [address], [place], [place]    Report Status 04/07/2021 FINAL  Final  Culture, blood (Routine x 2)     Status: None   Collection Time: 04/02/21  3:47 PM   Specimen: BLOOD  Result Value Ref Range Status   Specimen Description BLOOD BLOOD RIGHT HAND  Final   Special Requests   Final    BOTTLES DRAWN AEROBIC AND ANAEROBIC Blood Culture results may not be optimal due to an inadequate volume of blood received in culture bottles   Culture   Final    NO GROWTH 5 DAYS Performed at [facility], [address] [place], [place]    Report Status 04/07/2021 FINAL  Final  Resp Panel  by RT-PCR (Flu A&B, Covid) Nasopharyngeal Swab     Status: Abnormal   Collection Time: 04/02/21  3:47 PM   Specimen: Nasopharyngeal Swab; Nasopharyngeal(NP) swabs in vial transport medium  Result Value Ref Range Status   SARS Coronavirus 2 by RT PCR POSITIVE (A) NEGATIVE Final    Comment: RESULT CALLED TO, READ BACK BY AND VERIFIED WITH: LINDSAY BLACK @1646  04/02/21 MJU (NOTE) SARS-CoV-2 target nucleic acids are DETECTED.  The SARS-CoV-2 RNA is generally  detectable in upper respiratory specimens during the acute phase of infection. Positive results are indicative of the presence of the identified virus, but do not rule out bacterial infection or co-infection with other pathogens not detected by the test. Clinical correlation with patient history and other diagnostic information is necessary to determine patient infection status. The expected result is Negative.  Fact Sheet for Patients: BloggerCourse.com  Fact Sheet for Healthcare Providers: SeriousBroker.it  This test is not yet approved or cleared by the [place] FDA and  has been authorized for detection and/or diagnosis of SARS-CoV-2 by FDA under an Emergency Use Authorization (EUA).  This EUA will remain in effect (meaning this test can be u sed) for the duration of  the COVID-19 declaration under Section 564(b)(1) of the Act, 21 U.S.C. section 360bbb-3(b)(1), unless the authorization is terminated or revoked sooner.     Influenza A by PCR NEGATIVE NEGATIVE Final   Influenza B by PCR NEGATIVE NEGATIVE Final    Comment: (NOTE) The Xpert Xpress SARS-CoV-2/FLU/RSV plus assay is intended as an aid in the diagnosis of influenza from Nasopharyngeal swab specimens and should not be used as a sole basis for treatment. Nasal washings and aspirates are unacceptable for Xpert Xpress SARS-CoV-2/FLU/RSV testing.  Fact Sheet for  Patients: BloggerCourse.com  Fact Sheet for Healthcare Providers: SeriousBroker.it  This test is not yet approved or cleared by the [place] FDA and has been authorized for detection and/or diagnosis of SARS-CoV-2 by FDA under an Emergency Use Authorization (EUA). This EUA will remain in effect (meaning this test can be used) for the duration of the COVID-19 declaration under Section 564(b)(1) of the Act, 21 U.S.C. section 360bbb-3(b)(1), unless the authorization is terminated or revoked.  Performed at [facility], [address] [place], [place] [address]          Radiology Studies: MR BRAIN WO CONTRAST  Result Date: 04/08/2021 CLINICAL DATA:  Mental status change, unknown cause. Additional provided: Patient presenting with weakness, found to have COVID-19 infection, concern for stroke. EXAM: MRI HEAD WITHOUT CONTRAST TECHNIQUE: Multiplanar, multiecho pulse sequences of the brain and surrounding structures were obtained without intravenous contrast. COMPARISON:  Brain MRI 12/04/2020. FINDINGS: Brain: Intermittently motion degraded examination, limiting evaluation. Most notably, there is moderate/severe motion degradation of the axial T2 TSE sequence, moderate/severe motion degradation of the axial T2 FLAIR sequence and moderate motion degradation of the axial T1 weighted sequence. Mild generalized cerebral and cerebellar atrophy. Severe patchy and confluent T2/FLAIR hyperintense signal abnormality within the cerebral white matter, nonspecific but compatible with chronic small vessel ischemic disease. Redemonstrated chronic lacunar infarcts within the right corona radiata/basal ganglia and right thalamus. There is no acute infarct. No evidence of an intracranial mass. No chronic intracranial blood products. No extra-axial fluid collection. No midline shift. Vascular: Maintained flow voids within the proximal large arterial  vessels. Skull and upper cervical spine: No focal suspicious marrow lesion. Sinuses/Orbits: Visualized orbits show no acute finding. Trace mucosal thickening within the right sphenoid sinus. IMPRESSION: Motion degraded examination, as described and limiting evaluation. No evidence of acute intracranial abnormality. As before, there are severe chronic small-vessel ischemic changes within the cerebral white matter, with chronic lacunar infarcts in the right corona radiata/basal ganglia and right thalamus. Mild generalized parenchymal atrophy. Electronically Signed   By: Jackey Loge D.O.   On: 04/08/2021 12:42        Scheduled Meds:  amLODipine  5 mg Oral Daily   vitamin C  500 mg Oral Daily  cholecalciferol  1,000 Units Oral Daily   citalopram  40 mg Oral Daily   enoxaparin (LOVENOX) injection  40 mg Subcutaneous Q24H   fluPHENAZine  5 mg Oral Daily   Ipratropium-Albuterol  1 puff Inhalation QID   multivitamin with minerals  1 tablet Oral Daily   predniSONE  20 mg Oral Q breakfast   risperiDONE  0.5 mg Oral Daily   tamsulosin  0.4 mg Oral QPC breakfast   traZODone  25 mg Oral QHS   zinc sulfate  220 mg Oral Daily   Continuous Infusions:   LOS: 8 days    Time spent: 22 minutes    Marrion Coy, MD [facility]   To contact the attending provider between 7A-7P or the covering provider during after hours 7P-7A, please log into the web site [email or web address] and access using universal Bruce password for that web site. If you do not have the password, please call the hospital operator.  04/10/2021, 11:00 AM

## 2021-04-10 NOTE — Plan of Care (Signed)
Patient alert to person, place , and time. Flat affect. Patient on room air, vital signs are stable no reports of pain. Will continue to monitor.  Problem: Education: Goal: Knowledge of General Education information will improve Description: Including pain rating scale, medication(s)/side effects and non-pharmacologic comfort measures 04/10/2021 2310 by Damita Lack, RN Outcome: Progressing 04/10/2021 2310 by Damita Lack, RN Outcome: Progressing   Problem: Clinical Measurements: Goal: Ability to maintain clinical measurements within normal limits will improve 04/10/2021 2310 by Damita Lack, RN Outcome: Progressing 04/10/2021 2310 by Damita Lack, RN Outcome: Progressing Goal: Will remain free from infection 04/10/2021 2310 by Damita Lack, RN Outcome: Progressing 04/10/2021 2310 by Damita Lack, RN Outcome: Progressing Goal: Diagnostic test results will improve 04/10/2021 2310 by Damita Lack, RN Outcome: Progressing 04/10/2021 2310 by Damita Lack, RN Outcome: Progressing Goal: Respiratory complications will improve 04/10/2021 2310 by Damita Lack, RN Outcome: Progressing 04/10/2021 2310 by Damita Lack, RN Outcome: Progressing   Problem: Pain Managment: Goal: General experience of comfort will improve 04/10/2021 2310 by Damita Lack, RN Outcome: Progressing 04/10/2021 2310 by Damita Lack, RN Outcome: Progressing   Problem: Education: Goal: Knowledge of risk factors and measures for prevention of condition will improve 04/10/2021 2310 by Damita Lack, RN Outcome: Progressing 04/10/2021 2310 by Damita Lack, RN Outcome: Progressing   Problem: Respiratory: Goal: Will maintain a patent airway 04/10/2021 2310 by Damita Lack, RN Outcome: Progressing 04/10/2021 2310 by Damita Lack, RN Outcome: Progressing Goal: Complications related to the disease process, condition or treatment will be avoided or minimized 04/10/2021 2310 by Damita Lack, RN Outcome: Progressing 04/10/2021 2310 by Damita Lack, RN Outcome: Progressing

## 2021-04-11 DIAGNOSIS — D696 Thrombocytopenia, unspecified: Secondary | ICD-10-CM | POA: Diagnosis not present

## 2021-04-11 DIAGNOSIS — U071 COVID-19: Secondary | ICD-10-CM | POA: Diagnosis not present

## 2021-04-11 DIAGNOSIS — I10 Essential (primary) hypertension: Secondary | ICD-10-CM | POA: Diagnosis not present

## 2021-04-11 MED ORDER — PREDNISONE 10 MG PO TABS
10.0000 mg | ORAL_TABLET | Freq: Every day | ORAL | Status: AC
  Administered 2021-04-12 – 2021-04-13 (×2): 10 mg via ORAL
  Filled 2021-04-11 (×2): qty 1

## 2021-04-11 NOTE — Care Management Important Message (Signed)
Important Message  Patient Details  Name: [name] MRN: [id number] Date of Birth: [birth year]   Medicare Important Message Given:  Other (see comment)  Left a message for Heidi Lemay ([phone]) to return my call at his convenience so I can review the Important Message from Medicare. I will await a return call.   Olegario Messier A Millicent Blazejewski 04/11/2021, 2:12 PM

## 2021-04-11 NOTE — Progress Notes (Signed)
PROGRESS NOTE    [name]  DDU:[id number] DOB: 12-29-1949 DOA: 04/02/2021 PCP: Koren Bound, NP    Brief Narrative:   [name] is a 70 y.o. male with medical history significant for bipolar disorder, paranoid schizophrenia, hypertension, hyperlipidemia, who presented to [facility] ED from group home due to generalized weakness noted on the day of presentation associated with inability to walk and diffuse body aches mostly in his lower back.  He has a low-grade fever of 100.2.  He was COVID-positive.  He is treated with Paxil with and steroids.  He does not have hypoxemia.  He is pending for transfer to group home after quarantine of 10 days.  Transfer on 9/25.   Assessment & Plan:   Active Problems:   Essential hypertension   Schizophrenia (HCC)   Weakness   COVID-19 virus infection   Elevated LFTs   Thrombocytopenia (HCC)   Wheeze   Unsteady gait   Hyponatremia  #1.  COVID infection. Acute bronchitis with bronchospasm. Condition had improved, decrease prednisone to 10 mg daily for additional 2 days to complete 10-day course.  2.  Unsteady gait Prior strokes. Continue PT/OT.  3.  Chronic thrombocytopenia 4.  Essential hypertension.     DVT prophylaxis: Lovenox Code Status: full Family Communication:  Disposition Plan:    Status is: Inpatient  Remains inpatient appropriate because:Unsafe d/c plan  Dispo:  Patient From: Group Home  Planned Disposition: Group Home  Medically stable for discharge: No          I/O last 3 completed shifts: In: 480 [P.O.:480] Out: 3100 [Urine:3100] Total I/O In: -  Out: 1000 [Urine:1000]     Consultants:  None  Procedures: None  Antimicrobials: None  Subjective: Patient doing well, no short of breath or cough.  No chest pain or palpitation. No abdominal pain or nausea vomiting. Good appetite. No fever or chills.  Objective: Vitals:   04/10/21 2059 04/11/21 0028 04/11/21 0548 04/11/21 0905  BP: 119/71  120/78 138/81 117/69  Pulse: 80 72 68 89  Resp: 16 16 16 18   Temp: 98.3 F (36.8 C) 97.8 F (36.6 C) 98.2 F (36.8 C) 98.6 F (37 C)  TempSrc:  Oral Oral   SpO2: 95% 90% 98% 95%  Weight:      Height:        Intake/Output Summary (Last 24 hours) at 04/11/2021 1210 Last data filed at 04/11/2021 0900 Gross per 24 hour  Intake 240 ml  Output 3500 ml  Net -3260 ml   Filed Weights   04/02/21 1411  Weight: 90 kg    Examination:  General exam: Appears calm and comfortable  Respiratory system: Clear to auscultation. Respiratory effort normal. Cardiovascular system: S1 & S2 heard, RRR. No JVD, murmurs, rubs, gallops or clicks. No pedal edema. Gastrointestinal system: Abdomen is nondistended, soft and nontender. No organomegaly or masses felt. Normal bowel sounds heard. Central nervous system: Alert and oriented x2. No focal neurological deficits. Extremities: Symmetric 5 x 5 power. Skin: No rashes, lesions or ulcers Psychiatry: Judgement and insight appear normal. Mood & affect appropriate.     Data Reviewed: I have personally reviewed following labs and imaging studies  CBC: Recent Labs  Lab 04/07/21 0532  WBC 13.5*  HGB 16.1  HCT 47.7  MCV 88.8  PLT 104*   Basic Metabolic Panel: Recent Labs  Lab 04/05/21 0609 04/07/21 0532 04/08/21 0517 04/09/21 0458  NA 132* 130* 134*  --   K 4.6 5.1 4.4  --  CL 100 98 100  --   CO2 25 25 28   --   GLUCOSE 115* 113* 81  --   BUN 28* 31* 33*  --   CREATININE 0.93 0.92 1.00 0.84  CALCIUM 9.2 9.2 9.0  --    GFR: Estimated Creatinine Clearance: 89.4 mL/min (by C-G formula based on SCr of 0.84 mg/dL). Liver Function Tests: Recent Labs  Lab 04/05/21 0609 04/07/21 0532  AST 121* 47*  ALT 44 27  ALKPHOS 65 70  BILITOT 0.8 1.1  PROT 7.0 6.6  ALBUMIN 3.5 3.3*   No results for input(s): LIPASE, AMYLASE in the last 168 hours. No results for input(s): AMMONIA in the last 168 hours. Coagulation Profile: No results for  input(s): INR, PROTIME in the last 168 hours. Cardiac Enzymes: No results for input(s): CKTOTAL, CKMB, CKMBINDEX, TROPONINI in the last 168 hours. BNP (last 3 results) No results for input(s): PROBNP in the last 8760 hours. HbA1C: No results for input(s): HGBA1C in the last 72 hours. CBG: No results for input(s): GLUCAP in the last 168 hours. Lipid Profile: No results for input(s): CHOL, HDL, LDLCALC, TRIG, CHOLHDL, LDLDIRECT in the last 72 hours. Thyroid Function Tests: No results for input(s): TSH, T4TOTAL, FREET4, T3FREE, THYROIDAB in the last 72 hours. Anemia Panel: No results for input(s): VITAMINB12, FOLATE, FERRITIN, TIBC, IRON, RETICCTPCT in the last 72 hours. Sepsis Labs: No results for input(s): PROCALCITON, LATICACIDVEN in the last 168 hours.  Recent Results (from the past 240 hour(s))  Culture, blood (Routine x 2)     Status: None   Collection Time: 04/02/21  2:15 PM   Specimen: BLOOD  Result Value Ref Range Status   Specimen Description BLOOD RIGHT ANTECUBITAL  Final   Special Requests   Final    BOTTLES DRAWN AEROBIC AND ANAEROBIC Blood Culture adequate volume   Culture   Final    NO GROWTH 5 DAYS Performed at [facility], [address], [place], [place]    Report Status 04/07/2021 FINAL  Final  Culture, blood (Routine x 2)     Status: None   Collection Time: 04/02/21  3:47 PM   Specimen: BLOOD  Result Value Ref Range Status   Specimen Description BLOOD BLOOD RIGHT HAND  Final   Special Requests   Final    BOTTLES DRAWN AEROBIC AND ANAEROBIC Blood Culture results may not be optimal due to an inadequate volume of blood received in culture bottles   Culture   Final    NO GROWTH 5 DAYS Performed at [facility], [address] [place], [place]    Report Status 04/07/2021 FINAL  Final  Resp Panel by RT-PCR (Flu A&B, Covid) Nasopharyngeal Swab     Status: Abnormal   Collection Time: 04/02/21  3:47 PM   Specimen:  Nasopharyngeal Swab; Nasopharyngeal(NP) swabs in vial transport medium  Result Value Ref Range Status   SARS Coronavirus 2 by RT PCR POSITIVE (A) NEGATIVE Final    Comment: RESULT CALLED TO, READ BACK BY AND VERIFIED WITH: LINDSAY BLACK @1646  04/02/21 MJU (NOTE) SARS-CoV-2 target nucleic acids are DETECTED.  The SARS-CoV-2 RNA is generally detectable in upper respiratory specimens during the acute phase of infection. Positive results are indicative of the presence of the identified virus, but do not rule out bacterial infection or co-infection with other pathogens not detected by the test. Clinical correlation with patient history and other diagnostic information is necessary to determine patient infection status. The expected result is  Negative.  Fact Sheet for Patients: BloggerCourse.com  Fact Sheet for Healthcare Providers: SeriousBroker.it  This test is not yet approved or cleared by the [place] FDA and  has been authorized for detection and/or diagnosis of SARS-CoV-2 by FDA under an Emergency Use Authorization (EUA).  This EUA will remain in effect (meaning this test can be u sed) for the duration of  the COVID-19 declaration under Section 564(b)(1) of the Act, 21 U.S.C. section 360bbb-3(b)(1), unless the authorization is terminated or revoked sooner.     Influenza A by PCR NEGATIVE NEGATIVE Final   Influenza B by PCR NEGATIVE NEGATIVE Final    Comment: (NOTE) The Xpert Xpress SARS-CoV-2/FLU/RSV plus assay is intended as an aid in the diagnosis of influenza from Nasopharyngeal swab specimens and should not be used as a sole basis for treatment. Nasal washings and aspirates are unacceptable for Xpert Xpress SARS-CoV-2/FLU/RSV testing.  Fact Sheet for Patients: BloggerCourse.com  Fact Sheet for Healthcare Providers: SeriousBroker.it  This test is not yet approved or  cleared by the [place] FDA and has been authorized for detection and/or diagnosis of SARS-CoV-2 by FDA under an Emergency Use Authorization (EUA). This EUA will remain in effect (meaning this test can be used) for the duration of the COVID-19 declaration under Section 564(b)(1) of the Act, 21 U.S.C. section 360bbb-3(b)(1), unless the authorization is terminated or revoked.  Performed at [facility], [address], [place], [place] [address]          Radiology Studies: No results found.      Scheduled Meds:  amLODipine  5 mg Oral Daily   vitamin C  500 mg Oral Daily   cholecalciferol  1,000 Units Oral Daily   citalopram  40 mg Oral Daily   enoxaparin (LOVENOX) injection  40 mg Subcutaneous Q24H   fluPHENAZine  5 mg Oral Daily   Ipratropium-Albuterol  1 puff Inhalation QID   multivitamin with minerals  1 tablet Oral Daily   predniSONE  20 mg Oral Q breakfast   risperiDONE  0.5 mg Oral Daily   senna-docusate  2 tablet Oral BID   tamsulosin  0.4 mg Oral QPC breakfast   traZODone  25 mg Oral QHS   zinc sulfate  220 mg Oral Daily   Continuous Infusions:   LOS: 9 days    Time spent: 27 minutes    Marrion Coy, MD [facility]   To contact the attending provider between 7A-7P or the covering provider during after hours 7P-7A, please log into the web site [email or web address] and access using universal  password for that web site. If you do not have the password, please call the hospital operator.  04/11/2021, 12:10 PM

## 2021-04-11 NOTE — Plan of Care (Signed)
Patient alert to person, place , and time. Flat affect. Patient on room air, vital signs are stable no reports of pain. Will continue to monitor.  Problem: Education: Goal: Knowledge of General Education information will improve Description: Including pain rating scale, medication(s)/side effects and non-pharmacologic comfort measures Outcome: Progressing   Problem: Health Behavior/Discharge Planning: Goal: Ability to manage health-related needs will improve Outcome: Progressing   Problem: Education: Goal: Knowledge of risk factors and measures for prevention of condition will improve Outcome: Progressing   Problem: Respiratory: Goal: Will maintain a patent airway Outcome: Progressing Goal: Complications related to the disease process, condition or treatment will be avoided or minimized Outcome: Progressing

## 2021-04-12 DIAGNOSIS — U071 COVID-19: Secondary | ICD-10-CM | POA: Diagnosis not present

## 2021-04-12 DIAGNOSIS — F200 Paranoid schizophrenia: Secondary | ICD-10-CM | POA: Diagnosis not present

## 2021-04-12 DIAGNOSIS — R2681 Unsteadiness on feet: Secondary | ICD-10-CM | POA: Diagnosis not present

## 2021-04-12 MED ORDER — SIMVASTATIN 20 MG PO TABS
20.0000 mg | ORAL_TABLET | Freq: Every day | ORAL | Status: DC
  Administered 2021-04-12 – 2021-04-13 (×2): 20 mg via ORAL
  Filled 2021-04-12 (×2): qty 1

## 2021-04-12 NOTE — TOC Progression Note (Signed)
Transition of Care [facility]) - Progression Note    Patient Details  Name: [name] MRN: [id number] Date of Birth: January 23, 1950  Transition of Care [facility]) CM/SW Contact  Caryn Section, RN Phone Number: 04/12/2021, 2:17 PM  Clinical Narrative:  Patient lives in [facility] owner Timmy [phone] message for Timmy, awaiting response.     Expected Discharge Plan: Home w Home Health Services Barriers to Discharge: Other (must enter comment) (COVID - group home)  Expected Discharge Plan and Services Expected Discharge Plan: Home w Home Health Services   Discharge Planning Services: CM Consult Post Acute Care Choice: Home Health Living arrangements for the past 2 months: Group Home                           HH Arranged: PT, OT           Social Determinants of Health (SDOH) Interventions    Readmission Risk Interventions No flowsheet data found.

## 2021-04-12 NOTE — Progress Notes (Signed)
PROGRESS NOTE    [name]  DGU:[id number] DOB: 21-Dec-1949 DOA: 04/02/2021 PCP: Koren Bound, NP    Brief Narrative:  [name] is a 70 y.o. male with medical history significant for bipolar disorder, paranoid schizophrenia, hypertension, hyperlipidemia, who presented to [facility] ED from group home due to generalized weakness noted on the day of presentation associated with inability to walk and diffuse body aches mostly in his lower back.  He has a low-grade fever of 100.2.  He was COVID-positive.  He is treated with Paxil with and steroids.  He does not have hypoxemia.  He is pending for transfer to group home after quarantine of 10 days.  Transfer on 9/25.   Assessment & Plan:   Active Problems:   Essential hypertension   Schizophrenia (HCC)   Weakness   COVID-19 virus infection   Elevated LFTs   Thrombocytopenia (HCC)   Wheeze   Unsteady gait   Hyponatremia    #1.  COVID infection. Acute bronchitis with bronchospasm. Condition has improved. Will complete steroids by tomorrow.  2.  Unsteady gait. Prior stroke. Will set up PT/OT at time of discharge.  3.  Chronic thrombocytopenia  4.  Essential hypertension.     DVT prophylaxis: Lovenox Code Status: Full Family Communication:  Disposition Plan:    Status is: Inpatient  Remains inpatient appropriate because:Unsafe d/c plan  Dispo:  Patient From: Group Home  Planned Disposition: Group Home  Medically stable for discharge: No          I/O last 3 completed shifts: In: 240 [P.O.:240] Out: 2800 [Urine:2800] No intake/output data recorded.     Consultants:  None  Procedures: None  Antimicrobials: None Subjective: Patient doing well, good appetite without nausea vomiting. No short of breath. No dysuria hematuria  No fever chills    Objective: Vitals:   04/11/21 2107 04/12/21 0032 04/12/21 0603 04/12/21 0805  BP: 117/70 127/74 137/70 132/70  Pulse: 64 60 63 60  Resp: 20 18 18 18    Temp: 98.1 F (36.7 C) 98.5 F (36.9 C) (!) 97.5 F (36.4 C) 98 F (36.7 C)  TempSrc: Oral  Oral Oral  SpO2: 98% 96% 97% 98%  Weight:      Height:        Intake/Output Summary (Last 24 hours) at 04/12/2021 0959 Last data filed at 04/11/2021 2110 Gross per 24 hour  Intake --  Output 1100 ml  Net -1100 ml   Filed Weights   04/02/21 1411  Weight: 90 kg    Examination:  General exam: Appears calm and comfortable  Respiratory system: Clear to auscultation. Respiratory effort normal. Cardiovascular system: S1 & S2 heard, RRR. No JVD, murmurs, rubs, gallops or clicks. No pedal edema. Gastrointestinal system: Abdomen is nondistended, soft and nontender. No organomegaly or masses felt. Normal bowel sounds heard. Central nervous system: Alert and oriented x2. No focal neurological deficits. Extremities: Symmetric 5 x 5 power. Skin: No rashes, lesions or ulcers Psychiatry: Judgement and insight appear normal. Mood & affect appropriate.     Data Reviewed: I have personally reviewed following labs and imaging studies  CBC: Recent Labs  Lab 04/07/21 0532  WBC 13.5*  HGB 16.1  HCT 47.7  MCV 88.8  PLT 104*   Basic Metabolic Panel: Recent Labs  Lab 04/07/21 0532 04/08/21 0517 04/09/21 0458  NA 130* 134*  --   K 5.1 4.4  --   CL 98 100  --   CO2 25 28  --   GLUCOSE 113*  81  --   BUN 31* 33*  --   CREATININE 0.92 1.00 0.84  CALCIUM 9.2 9.0  --    GFR: Estimated Creatinine Clearance: 89.4 mL/min (by C-G formula based on SCr of 0.84 mg/dL). Liver Function Tests: Recent Labs  Lab 04/07/21 0532  AST 47*  ALT 27  ALKPHOS 70  BILITOT 1.1  PROT 6.6  ALBUMIN 3.3*   No results for input(s): LIPASE, AMYLASE in the last 168 hours. No results for input(s): AMMONIA in the last 168 hours. Coagulation Profile: No results for input(s): INR, PROTIME in the last 168 hours. Cardiac Enzymes: No results for input(s): CKTOTAL, CKMB, CKMBINDEX, TROPONINI in the last 168  hours. BNP (last 3 results) No results for input(s): PROBNP in the last 8760 hours. HbA1C: No results for input(s): HGBA1C in the last 72 hours. CBG: No results for input(s): GLUCAP in the last 168 hours. Lipid Profile: No results for input(s): CHOL, HDL, LDLCALC, TRIG, CHOLHDL, LDLDIRECT in the last 72 hours. Thyroid Function Tests: No results for input(s): TSH, T4TOTAL, FREET4, T3FREE, THYROIDAB in the last 72 hours. Anemia Panel: No results for input(s): VITAMINB12, FOLATE, FERRITIN, TIBC, IRON, RETICCTPCT in the last 72 hours. Sepsis Labs: No results for input(s): PROCALCITON, LATICACIDVEN in the last 168 hours.  Recent Results (from the past 240 hour(s))  Culture, blood (Routine x 2)     Status: None   Collection Time: 04/02/21  2:15 PM   Specimen: BLOOD  Result Value Ref Range Status   Specimen Description BLOOD RIGHT ANTECUBITAL  Final   Special Requests   Final    BOTTLES DRAWN AEROBIC AND ANAEROBIC Blood Culture adequate volume   Culture   Final    NO GROWTH 5 DAYS Performed at [facility], [address], [place], [place] [address]    Report Status 04/07/2021 FINAL  Final  Culture, blood (Routine x 2)     Status: None   Collection Time: 04/02/21  3:47 PM   Specimen: BLOOD  Result Value Ref Range Status   Specimen Description BLOOD BLOOD RIGHT HAND  Final   Special Requests   Final    BOTTLES DRAWN AEROBIC AND ANAEROBIC Blood Culture results may not be optimal due to an inadequate volume of blood received in culture bottles   Culture   Final    NO GROWTH 5 DAYS Performed at [facility], [address] [place], [place] [address]    Report Status 04/07/2021 FINAL  Final  Resp Panel by RT-PCR (Flu A&B, Covid) Nasopharyngeal Swab     Status: Abnormal   Collection Time: 04/02/21  3:47 PM   Specimen: Nasopharyngeal Swab; Nasopharyngeal(NP) swabs in vial transport medium  Result Value Ref Range Status   SARS Coronavirus 2 by RT PCR POSITIVE (A)  NEGATIVE Final    Comment: RESULT CALLED TO, READ BACK BY AND VERIFIED WITH: LINDSAY BLACK @1646  04/02/21 MJU (NOTE) SARS-CoV-2 target nucleic acids are DETECTED.  The SARS-CoV-2 RNA is generally detectable in upper respiratory specimens during the acute phase of infection. Positive results are indicative of the presence of the identified virus, but do not rule out bacterial infection or co-infection with other pathogens not detected by the test. Clinical correlation with patient history and other diagnostic information is necessary to determine patient infection status. The expected result is Negative.  Fact Sheet for Patients: 04/04/21  Fact Sheet for Healthcare Providers: BloggerCourse.com  This test is not yet approved or cleared by the [employer] and  has been authorized for detection and/or diagnosis of SARS-CoV-2 by FDA under an Emergency Use Authorization (EUA).  This EUA will remain in effect (meaning this test can be u sed) for the duration of  the COVID-19 declaration under Section 564(b)(1) of the Act, 21 U.S.C. section 360bbb-3(b)(1), unless the authorization is terminated or revoked sooner.     Influenza A by PCR NEGATIVE NEGATIVE Final   Influenza B by PCR NEGATIVE NEGATIVE Final    Comment: (NOTE) The Xpert Xpress SARS-CoV-2/FLU/RSV plus assay is intended as an aid in the diagnosis of influenza from Nasopharyngeal swab specimens and should not be used as a sole basis for treatment. Nasal washings and aspirates are unacceptable for Xpert Xpress SARS-CoV-2/FLU/RSV testing.  Fact Sheet for Patients: BloggerCourse.com  Fact Sheet for Healthcare Providers: SeriousBroker.it  This test is not yet approved or cleared by the [place] FDA and has been authorized for detection and/or diagnosis of SARS-CoV-2 by FDA under an Emergency Use Authorization  (EUA). This EUA will remain in effect (meaning this test can be used) for the duration of the COVID-19 declaration under Section 564(b)(1) of the Act, 21 U.S.C. section 360bbb-3(b)(1), unless the authorization is terminated or revoked.  Performed at [facility], [address] [place], [place] [address]          Radiology Studies: No results found.      Scheduled Meds:  amLODipine  5 mg Oral Daily   vitamin C  500 mg Oral Daily   cholecalciferol  1,000 Units Oral Daily   citalopram  40 mg Oral Daily   enoxaparin (LOVENOX) injection  40 mg Subcutaneous Q24H   fluPHENAZine  5 mg Oral Daily   Ipratropium-Albuterol  1 puff Inhalation QID   multivitamin with minerals  1 tablet Oral Daily   predniSONE  10 mg Oral Q breakfast   risperiDONE  0.5 mg Oral Daily   senna-docusate  2 tablet Oral BID   simvastatin  20 mg Oral q1800   tamsulosin  0.4 mg Oral QPC breakfast   traZODone  25 mg Oral QHS   zinc sulfate  220 mg Oral Daily   Continuous Infusions:   LOS: 10 days    Time spent: 20 minutes    Marrion Coy, MD [facility]   To contact the attending provider between 7A-7P or the covering provider during after hours 7P-7A, please log into the web site [email or web address] and access using universal [place] password for that web site. If you do not have the password, please call the hospital operator.  04/12/2021, 9:59 AM

## 2021-04-13 DIAGNOSIS — Z8673 Personal history of transient ischemic attack (TIA), and cerebral infarction without residual deficits: Secondary | ICD-10-CM | POA: Diagnosis not present

## 2021-04-13 DIAGNOSIS — U071 COVID-19: Secondary | ICD-10-CM | POA: Diagnosis not present

## 2021-04-13 DIAGNOSIS — I10 Essential (primary) hypertension: Secondary | ICD-10-CM | POA: Diagnosis not present

## 2021-04-13 NOTE — Progress Notes (Signed)
PROGRESS NOTE    [name]  ZOX:[id number] DOB: 06/24/1950 DOA: 04/02/2021 PCP: Koren Bound, NP    Brief Narrative:  [name] is a 70 y.o. male with medical history significant for bipolar disorder, paranoid schizophrenia, hypertension, hyperlipidemia, who presented to [facility] ED from group home due to generalized weakness noted on the day of presentation associated with inability to walk and diffuse body aches mostly in his lower back.  He has a low-grade fever of 100.2.  He was COVID-positive.  He is treated with Paxil with and steroids.  He does not have hypoxemia.  He is pending for transfer to group home after quarantine of 10 days.  Transfer on 9/25.   Assessment & Plan:   Active Problems:   Essential hypertension   Schizophrenia (HCC)   Weakness   COVID-19 virus infection   Elevated LFTs   Thrombocytopenia (HCC)   Wheeze   Unsteady gait   Hyponatremia  Patient condition has been stable, pending transfer to group home.  Patient has finished 10 days of quarantine for COVID.  But calling to group home since Friday did not get a response. Message left.              I/O last 3 completed shifts: In: 118 [P.O.:118] Out: 2350 [Urine:2350] Total I/O In: -  Out: 700 [Urine:700]     Subjective: Patient condition stable, he still has good appetite without nausea vomiting. No fever or chills.  No short of breath or hypoxia.  Objective: Vitals:   04/12/21 2217 04/13/21 0543 04/13/21 0900 04/13/21 1231  BP: 132/70 117/77 132/66 132/71  Pulse: 76 71 69 75  Resp: 16 18 18 18   Temp: 98.1 F (36.7 C) 98.1 F (36.7 C) 98.5 F (36.9 C) 98 F (36.7 C)  TempSrc: Oral Oral Oral   SpO2: 97% 100% 95% 98%  Weight:      Height:        Intake/Output Summary (Last 24 hours) at 04/13/2021 1352 Last data filed at 04/13/2021 1235 Gross per 24 hour  Intake 118 ml  Output 2250 ml  Net -2132 ml   Filed Weights   04/02/21 1411  Weight: 90 kg     Examination:  General exam: Appears calm and comfortable  Respiratory system: Clear to auscultation. Respiratory effort normal. Cardiovascular system: S1 & S2 heard, RRR. No JVD, murmurs, rubs, gallops or clicks. No pedal edema. Gastrointestinal system: Abdomen is nondistended, soft and nontender. No organomegaly or masses felt. Normal bowel sounds heard. Central nervous system: Alert and oriented x1.  No focal neurological deficits. Extremities: Symmetric 5 x 5 power. Skin: No rashes, lesions or ulcers Psychiatry:  Mood & affect appropriate.     Data Reviewed: I have personally reviewed following labs and imaging studies  CBC: Recent Labs  Lab 04/07/21 0532  WBC 13.5*  HGB 16.1  HCT 47.7  MCV 88.8  PLT 104*   Basic Metabolic Panel: Recent Labs  Lab 04/07/21 0532 04/08/21 0517 04/09/21 0458  NA 130* 134*  --   K 5.1 4.4  --   CL 98 100  --   CO2 25 28  --   GLUCOSE 113* 81  --   BUN 31* 33*  --   CREATININE 0.92 1.00 0.84  CALCIUM 9.2 9.0  --    GFR: Estimated Creatinine Clearance: 89.4 mL/min (by C-G formula based on SCr of 0.84 mg/dL). Liver Function Tests: Recent Labs  Lab 04/07/21 0532  AST 47*  ALT 27  ALKPHOS  70  BILITOT 1.1  PROT 6.6  ALBUMIN 3.3*   No results for input(s): LIPASE, AMYLASE in the last 168 hours. No results for input(s): AMMONIA in the last 168 hours. Coagulation Profile: No results for input(s): INR, PROTIME in the last 168 hours. Cardiac Enzymes: No results for input(s): CKTOTAL, CKMB, CKMBINDEX, TROPONINI in the last 168 hours. BNP (last 3 results) No results for input(s): PROBNP in the last 8760 hours. HbA1C: No results for input(s): HGBA1C in the last 72 hours. CBG: No results for input(s): GLUCAP in the last 168 hours. Lipid Profile: No results for input(s): CHOL, HDL, LDLCALC, TRIG, CHOLHDL, LDLDIRECT in the last 72 hours. Thyroid Function Tests: No results for input(s): TSH, T4TOTAL, FREET4, T3FREE, THYROIDAB in  the last 72 hours. Anemia Panel: No results for input(s): VITAMINB12, FOLATE, FERRITIN, TIBC, IRON, RETICCTPCT in the last 72 hours. Sepsis Labs: No results for input(s): PROCALCITON, LATICACIDVEN in the last 168 hours.  No results found for this or any previous visit (from the past 240 hour(s)).       Radiology Studies: No results found.      Scheduled Meds:  amLODipine  5 mg Oral Daily   vitamin C  500 mg Oral Daily   cholecalciferol  1,000 Units Oral Daily   citalopram  40 mg Oral Daily   enoxaparin (LOVENOX) injection  40 mg Subcutaneous Q24H   fluPHENAZine  5 mg Oral Daily   Ipratropium-Albuterol  1 puff Inhalation QID   multivitamin with minerals  1 tablet Oral Daily   risperiDONE  0.5 mg Oral Daily   senna-docusate  2 tablet Oral BID   simvastatin  20 mg Oral q1800   tamsulosin  0.4 mg Oral QPC breakfast   traZODone  25 mg Oral QHS   zinc sulfate  220 mg Oral Daily   Continuous Infusions:   LOS: 11 days    Time spent: 16 minutes    Marrion Coy, MD [facility]   To contact the attending provider between 7A-7P or the covering provider during after hours 7P-7A, please log into the web site [email or web address] and access using universal [place] password for that web site. If you do not have the password, please call the hospital operator.  04/13/2021, 1:52 PM

## 2021-04-13 NOTE — TOC Transition Note (Signed)
Transition of Care [facility]) - CM/SW Discharge Note   Patient Details  Name: [name] MRN: [id number] Date of Birth: 07-20-1950  Transition of [facility]) CM/SW Contact:  Luvenia Redden, RN Phone Number: 04/13/2021, 10:40 AM   Clinical Narrative:    RN outreached to the available contact numbers left a voice message. Also completed a google search and call surround facility unsuccessful. Attempted to speak with the pt directly for additional information however unsuccessful and very difficult to understand his conversation.   TOC will continue outreach attempts to the facility [facility] Michaela Shankel [phone] for pt's discharge.      Barriers to Discharge: Other (must enter comment) (COVID - group home)   Patient Goals and CMS Choice   CMS Medicare.gov Compare Post Acute Care list provided to:: Patient Represenative (must comment) Choice offered to / list presented to : Patient  Discharge Placement                       Discharge Plan and Services   Discharge Planning Services: CM Consult Post Acute Care Choice: Home Health                    HH Arranged: PT, OT          Social Determinants of Health (SDOH) Interventions     Readmission Risk Interventions No flowsheet data found.

## 2021-04-14 DIAGNOSIS — U071 COVID-19: Secondary | ICD-10-CM | POA: Diagnosis not present

## 2021-04-14 DIAGNOSIS — F200 Paranoid schizophrenia: Secondary | ICD-10-CM | POA: Diagnosis not present

## 2021-04-14 DIAGNOSIS — I10 Essential (primary) hypertension: Secondary | ICD-10-CM | POA: Diagnosis not present

## 2021-04-14 NOTE — Progress Notes (Signed)
Patient being discharged back to group home. Patients discharge paper work in packet for the group home. IV removed. Patient transported via Taxi by TOC.

## 2021-04-14 NOTE — Discharge Summary (Signed)
Physician Discharge Summary  Patient ID: [name] MRN: [id number] DOB/AGE: [birth year] 70 y.o.  Admit date: 04/02/2021 Discharge date: 04/14/2021  Admission Diagnoses:  Discharge Diagnoses:  Active Problems:   Essential hypertension   Schizophrenia (HCC)   Weakness   COVID-19 virus infection   Elevated LFTs   Thrombocytopenia (HCC)   Wheeze   Unsteady gait   Hyponatremia   History of stroke   Discharged Condition: good  Hospital Course:  [name] is a 70 y.o. male with medical history significant for bipolar disorder, paranoid schizophrenia, hypertension, hyperlipidemia, who presented to [facility] ED from group home due to generalized weakness noted on the day of presentation associated with inability to walk and diffuse body aches mostly in his lower back.  He has a low-grade fever of 100.2.  He was COVID-positive.  He is treated with Paxilovid with and steroids.  He does not have hypoxemia.  He is pending for transfer to group home after quarantine of 10 days.    #1.  COVID infection. Acute bronchitis with bronchospasm. Condition has improved. Steroid completed  2.  Unsteady gait. Prior stroke. Will set up PT/OT at time of discharge.  3.  Chronic thrombocytopenia  4.  Essential hypertension.  Consults: None  Significant Diagnostic Studies:   Treatments:   Discharge Exam: Blood pressure 119/64, pulse 63, temperature 98.1 F (36.7 C), temperature source Oral, resp. rate 16, height 5\' 9"  (1.753 m), weight 90 kg, SpO2 96 %. General appearance: alert and cooperative Resp: clear to auscultation bilaterally Cardio: regular rate and rhythm, S1, S2 normal, no murmur, click, rub or gallop GI: soft, non-tender; bowel sounds normal; no masses,  no organomegaly Extremities: extremities normal, atraumatic, no cyanosis or edema  Disposition: Discharge disposition: 01-Home or Self Care       Discharge Instructions     Diet - low sodium heart healthy   Complete by:  As directed    Increase activity slowly   Complete by: As directed       Allergies as of 04/14/2021       Reactions   Asa [aspirin]    Hx GI bleed        Medication List     TAKE these medications    amLODipine 10 MG tablet Commonly known as: NORVASC TAKE 1 TABLET BY MOUTH DAILY   citalopram 40 MG tablet Commonly known as: CELEXA Take 1 tablet (40 mg total) by mouth daily.   fluPHENAZine 5 MG tablet Commonly known as: PROLIXIN Take 1 tablet (5 mg total) by mouth daily.   lisinopril 20 MG tablet Commonly known as: ZESTRIL Take 20 mg by mouth daily.   risperiDONE 1 MG tablet Commonly known as: RISPERDAL Take 1 tablet (1 mg total) by mouth daily.   simvastatin 20 MG tablet Commonly known as: ZOCOR TAKE 1 TABLET BY MOUTH DAILY   tamsulosin 0.4 MG Caps capsule Commonly known as: FLOMAX TAKE 1 CAPSULE BY MOUTH DAILY AFTER SUPPER   traZODone 50 MG tablet Commonly known as: DESYREL Take 1 tablet (50 mg total) by mouth at bedtime.   vitamin B-12 100 MCG tablet Commonly known as: CYANOCOBALAMIN Take 1 tablet (100 mcg total) by mouth daily.   Vitamin D3 25 MCG (1000 UT) Caps Take 1 capsule (1,000 Units total) by mouth daily.        Follow-up Information     04/16/2021, NP Follow up in 1 week(s).   Specialty: Nurse Practitioner Contact information: [phone] [place]  [phone]         Debbe Odea, MD .   Specialties: Cardiology, Radiology Contact information: [address] [place] [address] [phone]                 Signed: Marrion Coy 04/14/2021, 10:18 AM

## 2021-04-14 NOTE — Progress Notes (Signed)
Physical Therapy Treatment Patient Details Name: [name] MRN: [id number] DOB: Nov 28, 1949 Today's Date: 04/14/2021   History of Present Illness [name] is a 70 y.o. male with medical history significant for bipolar disorder, paranoid schizophrenia, hypertension, hyperlipidemia, who presented to [facility] ED from group home due to generalized weakness noted on the day of presentation associated with inability to walk and diffuse body aches mostly in his lower back. Screening test for COVID-19 positive on 04/02/21 in the ED. Chest x-ray showing mild bibasilar atelectasis. Not hypoxic: O2 saturation 95% on room air.    PT Comments    Pt received laying in bed and flat affect requiring additional encouragement to participate in therapy. Pt agreeable to begin session with readjustment of bed linens while seated at EOB. Once at EOB, pt requesting to stand and walk with RW. Pt continues to demonstrate decreased gait speed and ambulates with precaution with RW. Pt denies any SOB during ambulation trail however, wanted to return back to recliner chair. Recommending usage of RW at discharge for ambulation to reduce fall risk and further HHPT to improve endurance, balance, and strength. Pt will continue to benefit from skilled PT services during hospitalization to increase independence and progress ambulation.    Recommendations for follow up therapy are one component of a multi-disciplinary discharge planning process, led by the attending physician.  Recommendations may be updated based on patient status, additional functional criteria and insurance authorization.  Follow Up Recommendations  Home health PT     Equipment Recommendations  Rolling walker with 5" wheels    Recommendations for Other Services       Precautions / Restrictions Precautions Precautions: Fall Restrictions Weight Bearing Restrictions: No     Mobility  Bed Mobility Overal bed mobility: Needs Assistance Bed Mobility:  Supine to Sit Rolling: Supervision   Supine to sit: Supervision     General bed mobility comments: Pt able to come to sitting at EOB with SPV and additional time to complete    Transfers Overall transfer level: Needs assistance   Transfers: Sit to/from Stand Sit to Stand: Min guard         General transfer comment: Pt requiring CGA for sit to stand for balance and cues for increased forward trunk lean for ease of transfer  Ambulation/Gait Ambulation/Gait assistance: Supervision;Min guard Gait Distance (Feet): 30 Feet Assistive device: Rolling walker (2 wheeled) Gait Pattern/deviations: Step-through pattern;Decreased stride length;Narrow base of support Gait velocity: decreased   General Gait Details: Pt requiring additional time for ambulation in room to door and back to chair. Pt denying any SOB, dizziness, or muscular fatigue during gait trial. Verbal cues for sequencing of RW and proper positioning of RW to increase safety   Stairs             Wheelchair Mobility    Modified Rankin (Stroke Patients Only)       Balance Overall balance assessment: Needs assistance Sitting-balance support: No upper extremity supported;Feet supported Sitting balance-Leahy Scale: Normal     Standing balance support: Bilateral upper extremity supported;During functional activity Standing balance-Leahy Scale: Fair Standing balance comment: Pt able to remove 1 UE off RW during gait trail without any LOB noted. Pt continues to be guarded and ambulates slowly to maintain balance.           Cognition Arousal/Alertness: Awake/alert Behavior During Therapy: Flat affect Overall Cognitive Status: No family/caregiver present to determine baseline cognitive functioning         Exercises Other Exercises Other Exercises:  Standing therex: Heel raises x 5 bilaterally, marching with RW support 1 x 10 bilaterally. No LOB noted during exercises. Increased difficutly with achieving heel  raise and decreased eccentric control    General Comments        Pertinent Vitals/Pain Pain Assessment: No/denies pain    Home Living                      Prior Function            PT Goals (current goals can now be found in the care plan section) Acute Rehab PT Goals PT Goal Formulation: With patient Time For Goal Achievement: 04/18/21 Potential to Achieve Goals: Good Progress towards PT goals: Progressing toward goals    Frequency    Min 2X/week      PT Plan Current plan remains appropriate    Co-evaluation              AM-PAC PT "6 Clicks" Mobility   Outcome Measure  Help needed turning from your back to your side while in a flat bed without using bedrails?: None Help needed moving from lying on your back to sitting on the side of a flat bed without using bedrails?: None Help needed moving to and from a bed to a chair (including a wheelchair)?: A Little Help needed standing up from a chair using your arms (e.g., wheelchair or bedside chair)?: A Little Help needed to walk in hospital room?: A Little Help needed climbing 3-5 steps with a railing? : A Lot 6 Click Score: 19    End of Session Equipment Utilized During Treatment: Gait belt Activity Tolerance: Patient tolerated treatment well Patient left: in chair;with call bell/phone within reach;with chair alarm set Nurse Communication: Mobility status PT Visit Diagnosis: Muscle weakness (generalized) (M62.81);Difficulty in walking, not elsewhere classified (R26.2)     Time: 5625-6389 PT Time Calculation (min) (ACUTE ONLY): 17 min  Charges:  $Gait Training: 8-22 mins                    Verl Blalock, SPT    Verl Blalock 04/14/2021, 1:32 PM

## 2021-04-14 NOTE — TOC Transition Note (Signed)
Transition of Care [facility]) - CM/SW Discharge Note   Patient Details  Name: [name] MRN: [id number] Date of Birth: [birth year]  Transition of Care [facility]) CM/SW Contact:  Gildardo Griffes, LCSW Phone Number: 04/14/2021, 10:58 AM   Clinical Narrative:     Patient to discharge back to group home today. CSW spoke with Cephus Shelling with [facility] who confirms patient can return today however will need transportation home. Requests that dc summary be sent with patient back to group home for staff to review.   CSW informed Timmy that patient is set up with Home Health PT and OT through [facility]. Cory with [facility] informed of dc today.   CSW  provided taxi voucher to charge RN to call [facility] when patient is ready.   Final next level of care: Home/Self Care Barriers to Discharge: No Barriers Identified   Patient Goals and CMS Choice Patient states their goals for this hospitalization and ongoing recovery are:: to go home CMS Medicare.gov Compare Post Acute Care list provided to:: Patient Choice offered to / list presented to : Patient  Discharge Placement                    Patient and family notified of of transfer: 04/14/21  Discharge Plan and Services   Discharge Planning Services: CM Consult Post Acute Care Choice: Home Health                    HH Arranged: PT, OT [facility] Agency: [facility] Care Date [facility] Agency Contacted: 04/14/21 Time HH Agency Contacted: 1057 Representative spoke with at [facility] Agency: Kandee Keen  Social Determinants of Health (SDOH) Interventions     Readmission Risk Interventions No flowsheet data found.

## 2021-04-17 ENCOUNTER — Ambulatory Visit: Payer: Medicare HMO | Admitting: Cardiology

## 2021-04-18 ENCOUNTER — Encounter: Payer: Self-pay | Admitting: Cardiology

## 2023-03-04 ENCOUNTER — Emergency Department: Payer: Medicare HMO

## 2023-03-04 ENCOUNTER — Inpatient Hospital Stay
Admission: EM | Admit: 2023-03-04 | Discharge: 2023-04-09 | DRG: 872 | Disposition: A | Discharge status: Skilled Nursing Facility | Payer: Medicare HMO | Attending: Internal Medicine | Admitting: Internal Medicine

## 2023-03-04 ENCOUNTER — Observation Stay: Payer: Medicare HMO

## 2023-03-04 ENCOUNTER — Other Ambulatory Visit: Payer: Self-pay

## 2023-03-04 DIAGNOSIS — L89312 Pressure ulcer of right buttock, stage 2: Secondary | ICD-10-CM | POA: Diagnosis not present

## 2023-03-04 DIAGNOSIS — Z1612 Extended spectrum beta lactamase (ESBL) resistance: Secondary | ICD-10-CM | POA: Diagnosis present

## 2023-03-04 DIAGNOSIS — J449 Chronic obstructive pulmonary disease, unspecified: Secondary | ICD-10-CM | POA: Diagnosis present

## 2023-03-04 DIAGNOSIS — L89322 Pressure ulcer of left buttock, stage 2: Secondary | ICD-10-CM | POA: Diagnosis not present

## 2023-03-04 DIAGNOSIS — R2681 Unsteadiness on feet: Secondary | ICD-10-CM | POA: Diagnosis present

## 2023-03-04 DIAGNOSIS — I7143 Infrarenal abdominal aortic aneurysm, without rupture: Secondary | ICD-10-CM | POA: Diagnosis present

## 2023-03-04 DIAGNOSIS — K5900 Constipation, unspecified: Secondary | ICD-10-CM | POA: Diagnosis not present

## 2023-03-04 DIAGNOSIS — E785 Hyperlipidemia, unspecified: Secondary | ICD-10-CM | POA: Diagnosis present

## 2023-03-04 DIAGNOSIS — I350 Nonrheumatic aortic (valve) stenosis: Secondary | ICD-10-CM | POA: Diagnosis present

## 2023-03-04 DIAGNOSIS — R7881 Bacteremia: Principal | ICD-10-CM | POA: Diagnosis present

## 2023-03-04 DIAGNOSIS — Z79890 Hormone replacement therapy (postmenopausal): Secondary | ICD-10-CM

## 2023-03-04 DIAGNOSIS — R55 Syncope and collapse: Secondary | ICD-10-CM | POA: Diagnosis not present

## 2023-03-04 DIAGNOSIS — Z751 Person awaiting admission to adequate facility elsewhere: Secondary | ICD-10-CM

## 2023-03-04 DIAGNOSIS — Z886 Allergy status to analgesic agent status: Secondary | ICD-10-CM

## 2023-03-04 DIAGNOSIS — F200 Paranoid schizophrenia: Secondary | ICD-10-CM | POA: Diagnosis present

## 2023-03-04 DIAGNOSIS — F319 Bipolar disorder, unspecified: Secondary | ICD-10-CM | POA: Diagnosis present

## 2023-03-04 DIAGNOSIS — F17210 Nicotine dependence, cigarettes, uncomplicated: Secondary | ICD-10-CM | POA: Diagnosis present

## 2023-03-04 DIAGNOSIS — A498 Other bacterial infections of unspecified site: Secondary | ICD-10-CM

## 2023-03-04 DIAGNOSIS — W19XXXA Unspecified fall, initial encounter: Principal | ICD-10-CM | POA: Diagnosis present

## 2023-03-04 DIAGNOSIS — I10 Essential (primary) hypertension: Secondary | ICD-10-CM | POA: Diagnosis present

## 2023-03-04 DIAGNOSIS — R4189 Other symptoms and signs involving cognitive functions and awareness: Secondary | ICD-10-CM | POA: Diagnosis present

## 2023-03-04 DIAGNOSIS — K219 Gastro-esophageal reflux disease without esophagitis: Secondary | ICD-10-CM | POA: Diagnosis present

## 2023-03-04 DIAGNOSIS — S0101XA Laceration without foreign body of scalp, initial encounter: Secondary | ICD-10-CM | POA: Diagnosis present

## 2023-03-04 DIAGNOSIS — R509 Fever, unspecified: Secondary | ICD-10-CM | POA: Diagnosis not present

## 2023-03-04 DIAGNOSIS — E039 Hypothyroidism, unspecified: Secondary | ICD-10-CM | POA: Diagnosis present

## 2023-03-04 DIAGNOSIS — F209 Schizophrenia, unspecified: Secondary | ICD-10-CM | POA: Diagnosis present

## 2023-03-04 DIAGNOSIS — N2889 Other specified disorders of kidney and ureter: Secondary | ICD-10-CM | POA: Diagnosis present

## 2023-03-04 DIAGNOSIS — E0590 Thyrotoxicosis, unspecified without thyrotoxic crisis or storm: Secondary | ICD-10-CM | POA: Diagnosis present

## 2023-03-04 DIAGNOSIS — Z79899 Other long term (current) drug therapy: Secondary | ICD-10-CM

## 2023-03-04 DIAGNOSIS — Z1152 Encounter for screening for COVID-19: Secondary | ICD-10-CM

## 2023-03-04 DIAGNOSIS — E871 Hypo-osmolality and hyponatremia: Secondary | ICD-10-CM | POA: Diagnosis present

## 2023-03-04 DIAGNOSIS — B9620 Unspecified Escherichia coli [E. coli] as the cause of diseases classified elsewhere: Secondary | ICD-10-CM | POA: Diagnosis present

## 2023-03-04 DIAGNOSIS — Z9181 History of falling: Secondary | ICD-10-CM

## 2023-03-04 DIAGNOSIS — R32 Unspecified urinary incontinence: Secondary | ICD-10-CM | POA: Diagnosis present

## 2023-03-04 DIAGNOSIS — Z23 Encounter for immunization: Secondary | ICD-10-CM

## 2023-03-04 DIAGNOSIS — F03A3 Unspecified dementia, mild, with mood disturbance: Secondary | ICD-10-CM | POA: Diagnosis present

## 2023-03-04 HISTORY — DX: Paranoid schizophrenia: F20.0

## 2023-03-04 HISTORY — DX: Gastro-esophageal reflux disease without esophagitis: K21.9

## 2023-03-04 HISTORY — DX: Unspecified dementia, mild, without behavioral disturbance, psychotic disturbance, mood disturbance, and anxiety: F03.A0

## 2023-03-04 HISTORY — DX: Nicotine dependence, unspecified, uncomplicated: F17.200

## 2023-03-04 HISTORY — DX: Gastrointestinal hemorrhage, unspecified: K92.2

## 2023-03-04 HISTORY — DX: Chronic obstructive pulmonary disease, unspecified: J44.9

## 2023-03-04 LAB — CBC
HCT: 43.1 % (ref 39.0–52.0)
Hemoglobin: 13.9 g/dL (ref 13.0–17.0)
MCH: 30 pg (ref 26.0–34.0)
MCHC: 32.3 g/dL (ref 30.0–36.0)
MCV: 93.1 fL (ref 80.0–100.0)
Platelets: 132 10*3/uL — ABNORMAL LOW (ref 150–400)
RBC: 4.63 MIL/uL (ref 4.22–5.81)
RDW: 13.8 % (ref 11.5–15.5)
WBC: 7.9 10*3/uL (ref 4.0–10.5)
nRBC: 0 % (ref 0.0–0.2)

## 2023-03-04 LAB — URINALYSIS, ROUTINE W REFLEX MICROSCOPIC
Bilirubin Urine: NEGATIVE
Glucose, UA: NEGATIVE mg/dL
Hgb urine dipstick: NEGATIVE
Ketones, ur: NEGATIVE mg/dL
Leukocytes,Ua: NEGATIVE
Nitrite: NEGATIVE
Protein, ur: 30 mg/dL — AB
Specific Gravity, Urine: 1.024 (ref 1.005–1.030)
pH: 5 (ref 5.0–8.0)

## 2023-03-04 LAB — BASIC METABOLIC PANEL
Anion gap: 9 (ref 5–15)
BUN: 15 mg/dL (ref 8–23)
CO2: 25 mmol/L (ref 22–32)
Calcium: 9.4 mg/dL (ref 8.9–10.3)
Chloride: 102 mmol/L (ref 98–111)
Creatinine, Ser: 1.05 mg/dL (ref 0.61–1.24)
GFR, Estimated: >60 mL/min (ref >60)
Glucose, Bld: 184 mg/dL — ABNORMAL HIGH (ref 70–99)
Potassium: 3.6 mmol/L (ref 3.5–5.1)
Sodium: 136 mmol/L (ref 135–145)

## 2023-03-04 LAB — HEMOGLOBIN A1C
Hgb A1c MFr Bld: 5.6 % (ref 4.8–5.6)
Mean Plasma Glucose: 114.02 mg/dL

## 2023-03-04 LAB — TSH: TSH: 34.872 u[IU]/mL — ABNORMAL HIGH (ref 0.350–4.500)

## 2023-03-04 LAB — T4, FREE: Free T4: 0.7 ng/dL (ref 0.61–1.12)

## 2023-03-04 LAB — TROPONIN I (HIGH SENSITIVITY): Troponin I (High Sensitivity): 6 ng/L (ref <18)

## 2023-03-04 MED ORDER — DOCUSATE SODIUM 100 MG PO CAPS
100.0000 mg | ORAL_CAPSULE | Freq: Two times a day (BID) | ORAL | Status: DC
  Administered 2023-03-04 – 2023-04-08 (×62): 100 mg via ORAL
  Filled 2023-03-04 (×68): qty 1

## 2023-03-04 MED ORDER — SODIUM CHLORIDE 0.9% FLUSH
3.0000 mL | Freq: Two times a day (BID) | INTRAVENOUS | Status: DC
  Administered 2023-03-04 – 2023-03-12 (×16): 3 mL via INTRAVENOUS

## 2023-03-04 MED ORDER — TETANUS-DIPHTH-ACELL PERTUSSIS 5-2.5-18.5 LF-MCG/0.5 IM SUSY
0.5000 mL | PREFILLED_SYRINGE | Freq: Once | INTRAMUSCULAR | Status: AC
  Administered 2023-03-04: 0.5 mL via INTRAMUSCULAR
  Filled 2023-03-04: qty 0.5

## 2023-03-04 MED ORDER — FLUPHENAZINE HCL 5 MG PO TABS
5.0000 mg | ORAL_TABLET | Freq: Every day | ORAL | Status: DC
  Administered 2023-03-05 – 2023-04-09 (×36): 5 mg via ORAL
  Filled 2023-03-04 (×36): qty 1

## 2023-03-04 MED ORDER — LIDOCAINE-EPINEPHRINE 2 %-1:100000 IJ SOLN
20.0000 mL | Freq: Once | INTRAMUSCULAR | Status: AC
  Administered 2023-03-04: 20 mL
  Filled 2023-03-04: qty 1

## 2023-03-04 MED ORDER — POLYETHYLENE GLYCOL 3350 17 G PO PACK
17.0000 g | PACK | Freq: Every day | ORAL | Status: DC | PRN
  Administered 2023-03-13 – 2023-03-17 (×5): 17 g via ORAL
  Filled 2023-03-04 (×7): qty 1

## 2023-03-04 MED ORDER — FLEET ENEMA RE ENEM: 1.0000 | ENEMA | Freq: Once | RECTAL | Status: DC | PRN

## 2023-03-04 MED ORDER — ACETAMINOPHEN 325 MG PO TABS
650.0000 mg | ORAL_TABLET | Freq: Four times a day (QID) | ORAL | Status: DC | PRN
  Administered 2023-03-07 – 2023-04-08 (×6): 650 mg via ORAL
  Filled 2023-03-04 (×6): qty 2

## 2023-03-04 MED ORDER — LACTATED RINGERS IV SOLN: INTRAVENOUS | Status: DC

## 2023-03-04 MED ORDER — HEPARIN SODIUM (PORCINE) 5000 UNIT/ML IJ SOLN
5000.0000 [IU] | Freq: Two times a day (BID) | INTRAMUSCULAR | Status: DC
  Administered 2023-03-04 – 2023-03-12 (×16): 5000 [IU] via SUBCUTANEOUS
  Filled 2023-03-04 (×16): qty 1

## 2023-03-04 MED ORDER — ONDANSETRON HCL 4 MG PO TABS: 4.0000 mg | ORAL_TABLET | Freq: Four times a day (QID) | ORAL | Status: DC | PRN

## 2023-03-04 MED ORDER — ACETAMINOPHEN 650 MG RE SUPP: 650.0000 mg | Freq: Four times a day (QID) | RECTAL | Status: DC | PRN

## 2023-03-04 MED ORDER — CITALOPRAM HYDROBROMIDE 20 MG PO TABS
40.0000 mg | ORAL_TABLET | Freq: Every day | ORAL | Status: DC
  Administered 2023-03-05 – 2023-04-09 (×36): 40 mg via ORAL
  Filled 2023-03-04 (×36): qty 2

## 2023-03-04 MED ORDER — ONDANSETRON HCL 4 MG/2ML IJ SOLN: 4.0000 mg | Freq: Four times a day (QID) | INTRAMUSCULAR | Status: DC | PRN

## 2023-03-04 MED ORDER — BISACODYL 5 MG PO TBEC
5.0000 mg | DELAYED_RELEASE_TABLET | Freq: Every day | ORAL | Status: DC | PRN
  Administered 2023-03-08 – 2023-03-17 (×7): 5 mg via ORAL
  Filled 2023-03-04 (×7): qty 1

## 2023-03-04 MED ORDER — RISPERIDONE 0.5 MG PO TABS
1.0000 mg | ORAL_TABLET | Freq: Every day | ORAL | Status: DC
  Administered 2023-03-05 – 2023-04-09 (×36): 1 mg via ORAL
  Filled 2023-03-04 (×36): qty 2

## 2023-03-04 NOTE — ED Notes (Signed)
Caimen Blechinger calls back and tells me that "I need you to keep him overnight, I am out of town now and he needs to be supervised for the next 24 hours because he collapsed at the [facility]"

## 2023-03-04 NOTE — ED Notes (Signed)
ED TO INPATIENT HANDOFF REPORT    S Name/Age/Gender [name] 72 y.o. male Room/Bed: ED31A/ED31A  Code Status   Code Status: Full Code  Home/SNF/Other Group Home (contact is Keilin Nash [phone] Patient oriented to: self, place, and situation Is this baseline? Yes   Triage Complete: Triage complete  Chief Complaint Syncope and collapse [R55]  Triage Note First Nurse Note;  Pt via [facility] via the [facility]. Pt had a witnessed fall,  Pt has 1 inch laceration R eyebrow, pt c/o weakness. Bystanders unknown if he loss consciousness. Pt is A&Ox2, family on scene stated that was with this. Denies any blood thinner and states he was acting his normal.  139/80 BP 70 HR  96% on RA  Pt to ED via [facility] from [facility]. Pt had a witnessed fall. Pt has laceration to right eyebrow, pt's head wrapped and bleeding controlled at this time. Pt reports feeling weak. Unknown LOC. Pt is alert to self only - RN unsure of baseline.    Allergies Allergies  Allergen Reactions   Asa [Aspirin]     Hx GI bleed    Level of Care/Admitting Diagnosis ED Disposition     ED Disposition  Admit   Condition  --   Comment  Hospital Area: [facility] [[id number]]  Level of Care: Telemetry Medical [104]  Covid Evaluation: Asymptomatic - no recent exposure (last 10 days) testing not required  Diagnosis: Syncope and collapse [780.2.ICD-9-CM]  Admitting Physician: Darrold Junker  Attending Physician: Darrold Junker          B Medical/Surgery History Past Medical History:  Diagnosis Date   Bipolar disorder (HCC)    Depression    Hyperlipidemia    Hypertension    Schizophrenia (HCC)    History reviewed. No pertinent surgical history.   A IV Location/Drains/Wounds Patient Lines/Drains/Airways Status     Active Line/Drains/Airways     Name Placement date Placement time Site Days   Peripheral IV 03/04/23 20 G Right Antecubital 03/04/23  1813   Antecubital  less than 1            Intake/Output Last 24 hours No intake or output data in the 24 hours ending 03/04/23 2102  Labs/Imaging Results for orders placed or performed during the hospital encounter of 03/04/23 (from the past 48 hour(s))  Basic metabolic panel     Status: Abnormal   Collection Time: 03/04/23 11:47 AM  Result Value Ref Range   Sodium 136 135 - 145 mmol/L   Potassium 3.6 3.5 - 5.1 mmol/L   Chloride 102 98 - 111 mmol/L   CO2 25 22 - 32 mmol/L   Glucose, Bld 184 (H) 70 - 99 mg/dL    Comment: Glucose reference range applies only to samples taken after fasting for at least 8 hours.   BUN 15 8 - 23 mg/dL   Creatinine, Ser 9.56 0.61 - 1.24 mg/dL   Calcium 9.4 8.9 - 21.3 mg/dL   GFR, Estimated >08 >65 mL/min    Comment: (NOTE) Calculated using the CKD-EPI Creatinine Equation (2021)    Anion gap 9 5 - 15    Comment: Performed at [facility] [address] [place], [place] [address]  CBC     Status: Abnormal   Collection Time: 03/04/23 11:47 AM  Result Value Ref Range   WBC 7.9 4.0 - 10.5 K/uL   RBC 4.63 4.22 - 5.81 MIL/uL   Hemoglobin 13.9 13.0 - 17.0 g/dL  HCT 43.1 39.0 - 52.0 %   MCV 93.1 80.0 - 100.0 fL   MCH 30.0 26.0 - 34.0 pg   MCHC 32.3 30.0 - 36.0 g/dL   RDW 16.1 09.6 - 04.5 %   Platelets 132 (L) 150 - 400 K/uL   nRBC 0.0 0.0 - 0.2 %    Comment: Performed at [facility], [address] [place], [place] [address]  Troponin I (High Sensitivity)     Status: None   Collection Time: 03/04/23 11:47 AM  Result Value Ref Range   Troponin I (High Sensitivity) 6 <18 ng/L    Comment: (NOTE) Elevated high sensitivity troponin I (hsTnI) values and significant  changes across serial measurements may suggest ACS but many other  chronic and acute conditions are known to elevate hsTnI results.  Refer to the "Links" section for chest pain algorithms and additional  guidance. Performed at [facility], [address] [place], [place] [address]   Urinalysis, Routine w reflex microscopic -Urine, Clean Catch     Status: Abnormal   Collection Time: 03/04/23  2:19 PM  Result Value Ref Range   Color, Urine YELLOW (A) YELLOW   APPearance HAZY (A) CLEAR   Specific Gravity, Urine 1.024 1.005 - 1.030   pH 5.0 5.0 - 8.0   Glucose, UA NEGATIVE NEGATIVE mg/dL   Hgb urine dipstick NEGATIVE NEGATIVE   Bilirubin Urine NEGATIVE NEGATIVE   Ketones, ur NEGATIVE NEGATIVE mg/dL   Protein, ur 30 (A) NEGATIVE mg/dL   Nitrite NEGATIVE NEGATIVE   Leukocytes,Ua NEGATIVE NEGATIVE   RBC / HPF 0-5 0 - 5 RBC/hpf   WBC, UA 0-5 0 - 5 WBC/hpf   Bacteria, UA RARE (A) NONE SEEN   Squamous Epithelial / HPF 0-5 0 - 5 /HPF   Mucus PRESENT    Hyaline Casts, UA PRESENT     Comment: Performed at [facility], [address] [place], [place] [address]  TSH     Status: Abnormal   Collection Time: 03/04/23  6:30 PM  Result Value Ref Range   TSH 34.872 (H) 0.350 - 4.500 uIU/mL    Comment: Performed by a 3rd Generation assay with a functional sensitivity of <=0.01 uIU/mL. Performed at [facility], [address] [place], [place] [address]   T4, free     Status: None   Collection Time: 03/04/23  6:30 PM  Result Value Ref Range   Free T4 0.70 0.61 - 1.12 ng/dL    Comment: (NOTE) Biotin ingestion may interfere with free T4 tests. If the results are inconsistent with the TSH level, previous test results, or the clinical presentation, then consider biotin interference. If needed, order repeat testing after stopping biotin. Performed at [facility], [address] [place], [place] [address]    X-ray chest PA and lateral  Result Date: 03/04/2023 CLINICAL DATA:  Syncope EXAM: CHEST - 2 VIEW COMPARISON:  CT chest dated 04/04/2019 FINDINGS: Mild left basilar scarring/atelectasis. Right lung is clear No pleural effusion or pneumothorax. Heart is top-normal in size.  Thoracic aortic atherosclerosis. Mild  degenerative changes of the visualized thoracolumbar spine. IMPRESSION: Mild left basilar scarring/atelectasis. No acute cardiopulmonary disease. Electronically Signed   By: Charline Bills M.D.   On: 03/04/2023 20:42   CT Cervical Spine Wo Contrast  Result Date: 03/04/2023 CLINICAL DATA:  Neck trauma.  Witnessed fall.  Provided history: EXAM: CT CERVICAL SPINE WITHOUT CONTRAST TECHNIQUE: Multidetector CT imaging of the cervical spine was performed without  intravenous contrast. Multiplanar CT image reconstructions were also generated. RADIATION DOSE REDUCTION: This exam was performed according to the departmental dose-optimization program which includes automated exposure control, adjustment of the mA and/or kV according to patient size and/or use of iterative reconstruction technique. COMPARISON:  None. FINDINGS: Alignment: Levocurvature of the cervical spine. Partially imaged dextrocurvature of the upper thoracic spine. Nonspecific straightening of the expected cervical lordosis. Slight grade 1 retrolisthesis at C3-C4 and C4-C5. Skull base and vertebrae: The basion-dental and atlanto-dental intervals are maintained.No evidence of acute fracture to the cervical spine. Soft tissues and spinal canal: No prevertebral fluid or swelling. No visible canal hematoma. Disc levels: Cervical spondylosis with multilevel disc space narrowing, disc bulges/central disc protrusions and uncovertebral hypertrophy. Disc space narrowing is greatest at C3-C4 and C4-C5 (moderate at these levels). No appreciable high-grade spinal canal stenosis. Multilevel bony neural foraminal narrowing. Degenerative changes are also present at the C1-C2 articulation. Multilevel ventral osteophytes. Upper chest: No consolidation within the imaged lung apices. No visible pneumothorax. Centrilobular and paraseptal emphysema. IMPRESSION: 1. No evidence of an acute cervical spine fracture. 2. Mild grade 1 retrolisthesis at C3-C4 and C4-C5. 3.  Nonspecific straightening of the expected cervical lordosis. 4. Levocurvature of the cervical spine. 5. Partially imaged dextrocurvature of the upper thoracic spine. 6. Cervical spondylosis as described. 7. Emphysema (ICD10-J43.9). Electronically Signed   By: Jackey Loge D.O.   On: 03/04/2023 16:05   CT HEAD WO CONTRAST  Result Date: 03/04/2023 CLINICAL DATA:  Head trauma, moderate to severe due to fall. Laceration to the eyebrow. EXAM: CT HEAD WITHOUT CONTRAST TECHNIQUE: Contiguous axial images were obtained from the base of the skull through the vertex without intravenous contrast. RADIATION DOSE REDUCTION: This exam was performed according to the departmental dose-optimization program which includes automated exposure control, adjustment of the mA and/or kV according to patient size and/or use of iterative reconstruction technique. COMPARISON:  Brain MRI 04/08/2021 FINDINGS: Brain: No evidence of acute infarction, hemorrhage, hydrocephalus, extra-axial collection or mass lesion/mass effect. 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 Age normal brain volume Vascular: No hyperdense vessel or unexpected calcification. Skull: Normal. Negative for fracture or focal lesion. Sinuses/Orbits: No acute finding. IMPRESSION: 1. No acute or interval finding. 2. Advanced chronic small vessel ischemia. Electronically Signed   By: Tiburcio Pea M.D.   On: 03/04/2023 13:26    Pending Labs Unresulted Labs (From admission, onward)     Start     Ordered   03/05/23 0500  Basic metabolic panel  Tomorrow morning,   R        03/04/23 1919   03/05/23 0500  CBC  Tomorrow morning,   R        03/04/23 1919   03/04/23 1917  Hemoglobin A1c  Add-on,   AD        03/04/23 1919            Vitals/Pain Today's Vitals   03/04/23 1145 03/04/23 1614  BP: 123/67 (!) 153/75  Pulse: 90 74   Resp: 18 16  Temp: 98.2 F (36.8 C) 98.9 F (37.2 C)  TempSrc: Oral Oral  SpO2: 96% (!) 9%  PainSc: 0-No pain     Isolation Precautions No active isolations  Medications Medications  citalopram (CELEXA) tablet 40 mg (has no administration in time range)  fluPHENAZine (PROLIXIN) tablet 5 mg (has no administration in time range)  risperiDONE (  RISPERDAL) tablet 1 mg (has no administration in time range)  sodium chloride flush (NS) 0.9 % injection 3 mL (has no administration in time range)  lactated ringers infusion (has no administration in time range)  acetaminophen (TYLENOL) tablet 650 mg (has no administration in time range)    Or  acetaminophen (TYLENOL) suppository 650 mg (has no administration in time range)  docusate sodium (COLACE) capsule 100 mg (has no administration in time range)  polyethylene glycol (MIRALAX / GLYCOLAX) packet 17 g (has no administration in time range)  bisacodyl (DULCOLAX) EC tablet 5 mg (has no administration in time range)  sodium phosphate (FLEET) enema 1 enema (has no administration in time range)  ondansetron (ZOFRAN) tablet 4 mg (has no administration in time range)    Or  ondansetron (ZOFRAN) injection 4 mg (has no administration in time range)  heparin injection 5,000 Units (has no administration in time range)  Tdap (BOOSTRIX) injection 0.5 mL (0.5 mLs Intramuscular Given 03/04/23 1458)  lidocaine-EPINEPHrine (XYLOCAINE W/EPI) 2 %-1:100000 (with pres) injection 20 mL (20 mLs Infiltration Given by Other 03/04/23 1609)    Mobility walks with person assist      Focused Assessments Laceration above left eyebrow was sutured   R Recommendations: See Admitting Provider Note  Pharmacy has not been able to complete med rec, unknown if pt has had his daily meds today

## 2023-03-04 NOTE — ED Triage Notes (Signed)
Pt to ED via [facility] from [facility]. Pt had a witnessed fall. Pt has laceration to right eyebrow, pt's head wrapped and bleeding controlled at this time. Pt reports feeling weak. Unknown LOC. Pt is alert to self only - RN unsure of baseline.

## 2023-03-04 NOTE — ED Notes (Signed)
Call to Cephus Shelling and left another message with request for call back.

## 2023-03-04 NOTE — ED Notes (Signed)
Attempted to reach emergency contact for pt.  Multiple calls, no answer, voicemail with call back number left, await call back.

## 2023-03-04 NOTE — ED Notes (Signed)
Pt voided in urinal around 1415 and urinal was left on side rail.  When I went to check on pt he was wet.  Changed pt out of wet clothing and into hospital gown, sheets changed and warm blankets given.  Helped pt wash up with soapy washcloths as he appears generally unkempt (significantly soiled clothing especially undergarments, long fingernails, long toenails and lots of flaking skin)

## 2023-03-04 NOTE — ED Triage Notes (Addendum)
First Nurse Note;  Pt via [facility] via the [employer]. Pt had a witnessed fall,  Pt has 1 inch laceration R eyebrow, pt c/o weakness. Bystanders unknown if he loss consciousness. Pt is A&Ox2, family on scene stated that was with this. Denies any blood thinner and states he was acting his normal.  139/80 BP 70 HR  96% on RA

## 2023-03-04 NOTE — H&P (Signed)
History and Physical    Patient: [name] GLO:[id number] DOB: [birth year] DOA: 03/04/2023 DOS: the patient was seen and examined on 03/05/2023 PCP: Koren Bound, NP  Patient coming from:  Group home.    Chief Complaint:  Chief Complaint  Patient presents with   Fall   Head Injury    HPI: Andras Beveridge is a 72 y.o. male with medical history significant for hypertension, hyperlipidemia, COPD, bipolar disorder, schizophrenia, presenting for fall today. Pt had fall on his face and has laceration that is repaired in ed. Admission for syncope/ dizziness/ falls. Initial vitals show blood pressure 139/80 heart rate of 70 O2 sats of 96% on room air. The emergency room EKG sinus rhythm with 88 left axis deviation no ST-T wave changes.  Patient is in no distress at this time alert awake oriented cooperative nonfocal answers questions has mild dementia he is already oriented to self but not location.  States that he does at his house.  States he not just to see here.  In the emergency room head CT done noncontrast is negative for any acute process, CT spine negative for any acute processes laceration was repaired. In the emergency room patient is alert awake oriented afebrile. Labs shows : glucose of 184 and  LFT pending. Cbc normal plt 132.EKG shows sinus rhythm at 88 PR 172.  QRS is 116 and LAD, QTc  459. Prior EKG showed atrial flutter. Pt sees [facility] group dr. Azucena Cecil and last 2 d echo on 12/03/2020 shows: 1. Left ventricular ejection fraction, by estimation, is 60 to 65% . The left ventricle has normal function. The left ventricle has no regional wall motion abnormalities. There is mild left ventricular hypertrophy. Left ventricular diastolic parameters were normal. 2. Right ventricular systolic function is normal. The right ventricular size is normal. 3. The mitral valve is normal in structure. Trivial mitral valve regurgitation. 4. The aortic valve is calcified. Aortic valve regurgitation is not  visualized. Mild aortic valve stenosis.  Review of Systems: Review of Systems  Neurological:  Positive for dizziness.       Presyncope.   All other systems reviewed and are negative.   Past Medical History:  Diagnosis Date   Bipolar disorder (HCC)    Depression    Hyperlipidemia    Hypertension    Schizophrenia (HCC)    History reviewed. No pertinent surgical history. Social History:   reports that he has been smoking cigarettes. He has never used smokeless tobacco. He reports that he does not drink alcohol and does not use drugs.  Allergies  Allergen Reactions   Asa [Aspirin]     Hx GI bleed    Family History  Family history unknown: Yes    Prior to Admission medications   Medication Sig Start Date End Date Taking? Authorizing Provider  amLODipine (NORVASC) 10 MG tablet TAKE 1 TABLET BY MOUTH DAILY 05/31/17   Plonk, Chrissie Noa, MD  Cholecalciferol (VITAMIN D3) 1000 units CAPS Take 1 capsule (1,000 Units total) by mouth daily. 08/05/17   Plonk, Chrissie Noa, MD  citalopram (CELEXA) 40 MG tablet Take 1 tablet (40 mg total) by mouth daily. 09/18/20   Zena Amos, MD  fluPHENAZine (PROLIXIN) 5 MG tablet Take 1 tablet (5 mg total) by mouth daily. 09/18/20   Zena Amos, MD  lisinopril (ZESTRIL) 20 MG tablet Take 20 mg by mouth daily.    [provider]  risperiDONE (RISPERDAL) 1 MG tablet Take 1 tablet (1 mg total) by mouth daily. 09/18/20   Evelene Croon,  Mandeep, MD  simvastatin (ZOCOR) 20 MG tablet TAKE 1 TABLET BY MOUTH DAILY 05/31/17   Plonk, Chrissie Noa, MD  tamsulosin (FLOMAX) 0.4 MG CAPS capsule TAKE 1 CAPSULE BY MOUTH DAILY AFTER SUPPER 12/11/16   Plonk, Chrissie Noa, MD  traZODone (DESYREL) 50 MG tablet Take 1 tablet (50 mg total) by mouth at bedtime. 09/18/20   Zena Amos, MD     Vitals:   03/04/23 2235 03/05/23 0000 03/05/23 0030 03/05/23 0115  BP: (!) 158/91 133/86 (!) 156/68 (!) 160/100  Pulse: 94 80 (!) 129 (!) 104  Resp: 15 13 (!) 23 (!) 25  Temp:      TempSrc:      SpO2:  94% 94% 93% 93%   Physical Exam Vitals and nursing note reviewed.  Constitutional:      General: He is not in acute distress. HENT:     Head: Normocephalic and atraumatic.      Right Ear: Hearing normal.     Left Ear: Hearing normal.     Nose: Nose normal. No nasal deformity.     Mouth/Throat:     Lips: Pink.     Tongue: No lesions.     Pharynx: Oropharynx is clear.  Eyes:     General: Lids are normal.     Extraocular Movements: Extraocular movements intact.  Cardiovascular:     Rate and Rhythm: Normal rate and regular rhythm.     Heart sounds: Normal heart sounds.  Pulmonary:     Effort: Pulmonary effort is normal.     Breath sounds: Normal breath sounds.  Abdominal:     General: Bowel sounds are normal. There is no distension.     Palpations: Abdomen is soft. There is no mass.     Tenderness: There is no abdominal tenderness.  Musculoskeletal:     Right lower leg: No edema.     Left lower leg: No edema.  Skin:    General: Skin is warm.  Neurological:     General: No focal deficit present.     Mental Status: He is alert and oriented to person, place, and time.     Cranial Nerves: Cranial nerves 2-12 are intact.  Psychiatric:        Attention and Perception: Attention normal.        Mood and Affect: Mood normal.        Speech: Speech normal.        Behavior: Behavior normal. Behavior is cooperative.    Labs on Admission: I have personally reviewed following labs and imaging studies. CBC: Recent Labs  Lab 03/04/23 1147  WBC 7.9  HGB 13.9  HCT 43.1  MCV 93.1  PLT 132*   Basic Metabolic Panel: Recent Labs  Lab 03/04/23 1147  NA 136  K 3.6  CL 102  CO2 25  GLUCOSE 184*  BUN 15  CREATININE 1.05  CALCIUM 9.4   GFR: CrCl cannot be calculated (Unknown ideal weight.). Liver Function Tests: No results for input(s): "AST", "ALT", "ALKPHOS", "BILITOT", "PROT", "ALBUMIN" in the last 168 hours. No results for input(s): "LIPASE", "AMYLASE" in the last 168  hours. No results for input(s): "AMMONIA" in the last 168 hours. Coagulation Profile: No results for input(s): "INR", "PROTIME" in the last 168 hours. Cardiac Enzymes: No results for input(s): "CKTOTAL", "CKMB", "CKMBINDEX", "TROPONINI" in the last 168 hours. BNP (last 3 results) No results for input(s): "PROBNP" in the last 8760 hours. HbA1C: Recent Labs    03/04/23 1147  HGBA1C 5.6   CBG:  No results for input(s): "GLUCAP" in the last 168 hours. Lipid Profile: No results for input(s): "CHOL", "HDL", "LDLCALC", "TRIG", "CHOLHDL", "LDLDIRECT" in the last 72 hours. Thyroid Function Tests: Recent Labs    03/04/23 1830  TSH 34.872*  FREET4 0.70   Anemia Panel: No results for input(s): "VITAMINB12", "FOLATE", "FERRITIN", "TIBC", "IRON", "RETICCTPCT" in the last 72 hours. Urinalysis    Component Value Date/Time   COLORURINE YELLOW (A) 03/04/2023 1419   APPEARANCEUR HAZY (A) 03/04/2023 1419   APPEARANCEUR Clear 01/03/2013 0232   LABSPEC 1.024 03/04/2023 1419   LABSPEC 1.006 01/03/2013 0232   PHURINE 5.0 03/04/2023 1419   GLUCOSEU NEGATIVE 03/04/2023 1419   GLUCOSEU Negative 01/03/2013 0232   HGBUR NEGATIVE 03/04/2023 1419   BILIRUBINUR NEGATIVE 03/04/2023 1419   BILIRUBINUR Negative 01/03/2013 0232   KETONESUR NEGATIVE 03/04/2023 1419   PROTEINUR 30 (A) 03/04/2023 1419   NITRITE NEGATIVE 03/04/2023 1419   LEUKOCYTESUR NEGATIVE 03/04/2023 1419   LEUKOCYTESUR Negative 01/03/2013 0232   Unresulted Labs (From admission, onward)     Start     Ordered   03/05/23 0500  Basic metabolic panel  Tomorrow morning,   R        03/04/23 1919   03/05/23 0500  CBC  Tomorrow morning,   R        03/04/23 1919   03/05/23 0231  Lipid panel  Once,   R        03/05/23 0230   03/05/23 0218  Hepatic function panel  Add-on,   AD        03/05/23 0217   03/05/23 0158  Blood gas, venous  ONCE - STAT,   STAT        03/05/23 0157           Medications  citalopram (CELEXA) tablet 40 mg  (has no administration in time range)  fluPHENAZine (PROLIXIN) tablet 5 mg (has no administration in time range)  risperiDONE (RISPERDAL) tablet 1 mg (has no administration in time range)  sodium chloride flush (NS) 0.9 % injection 3 mL (3 mLs Intravenous Given 03/04/23 2229)  acetaminophen (TYLENOL) tablet 650 mg (has no administration in time range)    Or  acetaminophen (TYLENOL) suppository 650 mg (has no administration in time range)  docusate sodium (COLACE) capsule 100 mg (100 mg Oral Given 03/04/23 2230)  polyethylene glycol (MIRALAX / GLYCOLAX) packet 17 g (has no administration in time range)  bisacodyl (DULCOLAX) EC tablet 5 mg (has no administration in time range)  sodium phosphate (FLEET) enema 1 enema (has no administration in time range)  ondansetron (ZOFRAN) tablet 4 mg (has no administration in time range)    Or  ondansetron (ZOFRAN) injection 4 mg (has no administration in time range)  heparin injection 5,000 Units (5,000 Units Subcutaneous Given 03/04/23 2230)  amLODipine (NORVASC) tablet 10 mg (has no administration in time range)  lisinopril (ZESTRIL) tablet 5 mg (has no administration in time range)  hydrALAZINE (APRESOLINE) injection 10 mg (has no administration in time range)  levothyroxine (SYNTHROID) tablet 25 mcg (has no administration in time range)  Tdap (BOOSTRIX) injection 0.5 mL (0.5 mLs Intramuscular Given 03/04/23 1458)  lidocaine-EPINEPHrine (XYLOCAINE W/EPI) 2 %-1:100000 (with pres) injection 20 mL (20 mLs Infiltration Given by Other 03/04/23 1609)  lactated ringers infusion ( Intravenous New Bag/Given 03/05/23 0217)    Radiological Exams on Admission: X-ray chest PA and lateral  Result Date: 03/04/2023 CLINICAL DATA:  Syncope EXAM: CHEST - 2 VIEW COMPARISON:  CT chest dated  04/04/2019 FINDINGS: Mild left basilar scarring/atelectasis. Right lung is clear No pleural effusion or pneumothorax. Heart is top-normal in size.  Thoracic aortic atherosclerosis. Mild  degenerative changes of the visualized thoracolumbar spine. IMPRESSION: Mild left basilar scarring/atelectasis. No acute cardiopulmonary disease. Electronically Signed   By: Charline Bills M.D.   On: 03/04/2023 20:42   CT Cervical Spine Wo Contrast  Result Date: 03/04/2023 CLINICAL DATA:  Neck trauma.  Witnessed fall.  Provided history: EXAM: CT CERVICAL SPINE WITHOUT CONTRAST TECHNIQUE: Multidetector CT imaging of the cervical spine was performed without intravenous contrast. Multiplanar CT image reconstructions were also generated. RADIATION DOSE REDUCTION: This exam was performed according to the departmental dose-optimization program which includes automated exposure control, adjustment of the mA and/or kV according to patient size and/or use of iterative reconstruction technique. COMPARISON:  None. FINDINGS: Alignment: Levocurvature of the cervical spine. Partially imaged dextrocurvature of the upper thoracic spine. Nonspecific straightening of the expected cervical lordosis. Slight grade 1 retrolisthesis at C3-C4 and C4-C5. Skull base and vertebrae: The basion-dental and atlanto-dental intervals are maintained.No evidence of acute fracture to the cervical spine. Soft tissues and spinal canal: No prevertebral fluid or swelling. No visible canal hematoma. Disc levels: Cervical spondylosis with multilevel disc space narrowing, disc bulges/central disc protrusions and uncovertebral hypertrophy. Disc space narrowing is greatest at C3-C4 and C4-C5 (moderate at these levels). No appreciable high-grade spinal canal stenosis. Multilevel bony neural foraminal narrowing. Degenerative changes are also present at the C1-C2 articulation. Multilevel ventral osteophytes. Upper chest: No consolidation within the imaged lung apices. No visible pneumothorax. Centrilobular and paraseptal emphysema. IMPRESSION: 1. No evidence of an acute cervical spine fracture. 2. Mild grade 1 retrolisthesis at C3-C4 and C4-C5. 3.  Nonspecific straightening of the expected cervical lordosis. 4. Levocurvature of the cervical spine. 5. Partially imaged dextrocurvature of the upper thoracic spine. 6. Cervical spondylosis as described. 7. Emphysema (ICD10-J43.9). Electronically Signed   By: Jackey Loge D.O.   On: 03/04/2023 16:05   CT HEAD WO CONTRAST  Result Date: 03/04/2023 CLINICAL DATA:  Head trauma, moderate to severe due to fall. Laceration to the eyebrow. EXAM: CT HEAD WITHOUT CONTRAST TECHNIQUE: Contiguous axial images were obtained from the base of the skull through the vertex without intravenous contrast. RADIATION DOSE REDUCTION: This exam was performed according to the departmental dose-optimization program which includes automated exposure control, adjustment of the mA and/or kV according to patient size and/or use of iterative reconstruction technique. COMPARISON:  Brain MRI 04/08/2021 FINDINGS: Brain: No evidence of acute infarction, hemorrhage, hydrocephalus, extra-axial collection or mass lesion/mass effect. 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 Age normal brain volume Vascular: No hyperdense vessel or unexpected calcification. Skull: Normal. Negative for fracture or focal lesion. Sinuses/Orbits: No acute finding. IMPRESSION: 1. No acute or interval finding. 2. Advanced chronic small vessel ischemia. Electronically Signed   By: Tiburcio Pea M.D.   On: 03/04/2023 13:26     Data Reviewed: Relevant notes from primary care and specialist visits, past discharge summaries as available in EHR, including Care Everywhere. Prior diagnostic testing as pertinent to current admission diagnoses Updated medications and problem lists for reconciliation ED course, including vitals, labs, imaging, treatment and response to treatment Triage notes, nursing and pharmacy notes and ED provider's  notes Notable results as noted in HPI  Assessment and Plan: * Syncope and collapse Vitals:   03/04/23 1145 03/04/23 1614 03/04/23 2235  03/05/23 0000  BP: 123/67 (!) 153/75 (!) 158/91 133/86   03/05/23 0030 03/05/23 0115  BP: (!) 156/68 (!) 160/100  Suspect orthostatic component to his blood pressure and we will recheck.  Fall precautions, aspiration precautions. As needed hydralazine for blood pressures. We will get Mri brain Non contrast MRA neck.     Hypothyroidism Start pt on levothyroxine 25 mcg and PCP to manage up titration of dose.    Unsteady gait Fall precaution. PT consult prior to Discharge. Orthostatic vitals prior to discharge.   Schizophrenia (HCC) Will continue patient on his home regimen of Celexa Prolixin Risperdal and trazodone.   COPD (chronic obstructive pulmonary disease) (HCC) Stable. PRN albuterol. Oxygen and additional therapy as needed.   Hyperlipidemia Patient is currently on Zocor which we will continue.  GERD (gastroesophageal reflux disease) IV ppi therapy.  Aspiration precaution.   Essential hypertension Vitals:   03/04/23 1145 03/04/23 1614 03/04/23 2235 03/05/23 0000  BP: 123/67 (!) 153/75 (!) 158/91 133/86   03/05/23 0030 03/05/23 0115  BP: (!) 156/68 (!) 160/100  We will continue pt on amlodipine 10 and lisinopril 5 mg.     DVT prophylaxis:  Heparin  Consults:  None.   Advance Care Planning:    Code Status: Full Code   Family Communication:  None   Disposition Plan:  Home   Severity of Illness: The appropriate patient status for this patient is OBSERVATION. Observation status is judged to be reasonable and necessary in order to provide the required intensity of service to ensure the patient's safety. The patient's presenting symptoms, physical exam findings, and initial radiographic and laboratory data in the context of their medical condition is felt to place them at decreased risk for further clinical deterioration.  Furthermore, it is anticipated that the patient will be medically stable for discharge from the hospital within 2 midnights of admission.   Author: Gertha Calkin, MD 03/05/2023 2:31 AM  For on call review [email or web address].

## 2023-03-04 NOTE — ED Notes (Signed)
Med rec has not been completed on pt and as it is not known if he had his meds today.  Await pharmacy

## 2023-03-04 NOTE — ED Notes (Signed)
Attempt to call Group home again at [phone].

## 2023-03-04 NOTE — ED Notes (Signed)
Pt returns from xray, no distress

## 2023-03-04 NOTE — ED Provider Notes (Addendum)
[facility] Provider Note    Event Date/Time   First MD Initiated Contact with Patient 03/04/23 1349     (approximate)   History   Chief Complaint Fall and Head Injury   HPI  [name] is a 72 y.o. male with past medical history of hypertension, hyperlipidemia, COPD, bipolar disorder, and schizophrenia who presents to the ED for fall.  History is limited as patient does not recall what happened earlier today.  Per EMS, patient had a witnessed fall while at the [facility], striking his head.  It is unclear whether he lost consciousness, family with the patient reportedly stated that he is at his baseline mental status.  He does not take any blood thinners, currently denies any complaints.     Physical Exam   Triage Vital Signs: ED Triage Vitals [03/04/23 1145]  Encounter Vitals Group     BP 123/67     Systolic BP Percentile      Diastolic BP Percentile      Pulse Rate 90     Resp 18     Temp 98.2 F (36.8 C)     Temp Source Oral     SpO2 96 %     Weight      Height      Head Circumference      Peak Flow      Pain Score 0     Pain Loc      Pain Education      Exclude from Growth Chart     Most recent vital signs: Vitals:   03/04/23 1145  BP: 123/67  Pulse: 90  Resp: 18  Temp: 98.2 F (36.8 C)  SpO2: 96%    Constitutional: Alert and oriented to person and place, but not time or situation. Eyes: Conjunctivae are normal. Head: Irregular laceration to right frontal scalp just above the eyebrow, no active bleeding noted. Nose: No congestion/rhinnorhea. Mouth/Throat: Mucous membranes are moist.  Neck: No midline cervical spine tenderness to palpation. Cardiovascular: Normal rate, regular rhythm. Grossly normal heart sounds.  2+ radial pulses bilaterally. Respiratory: Normal respiratory effort.  No retractions. Lungs CTAB. Gastrointestinal: Soft and nontender. No distention. Musculoskeletal: No lower extremity tenderness nor edema.  No  upper extremity bony tenderness to palpation. Neurologic:  Normal speech and language. No gross focal neurologic deficits are appreciated.    ED Results / Procedures / Treatments   Labs (all labs ordered are listed, but only abnormal results are displayed) Labs Reviewed  BASIC METABOLIC PANEL - Abnormal; Notable for the following components:      Result Value   Glucose, Bld 184 (*)    All other components within normal limits  CBC - Abnormal; Notable for the following components:   Platelets 132 (*)    All other components within normal limits  URINALYSIS, ROUTINE W REFLEX MICROSCOPIC - Abnormal; Notable for the following components:   Color, Urine YELLOW (*)    APPearance HAZY (*)    Protein, ur 30 (*)    Bacteria, UA RARE (*)    All other components within normal limits    EKG  ED ECG REPORT I, Chesley Noon, the attending physician, personally viewed and interpreted this ECG.   Date: 03/04/2023  EKG Time: 11:50  Rate: 88  Rhythm: normal sinus rhythm  Axis: LAD  Intervals:left anterior fascicular block  ST&T Change: None  RADIOLOGY CT head reviewed and interpreted by me with no hemorrhage or midline shift.  PROCEDURES:  Critical  Care performed: No  ..Laceration Repair  Date/Time: 03/04/2023 4:09 PM  Performed by: Chesley Noon, MD Authorized by: Chesley Noon, MD   Consent:    Consent obtained:  Verbal   Consent given by:  Patient   Risks, benefits, and alternatives were discussed: yes   Universal protocol:    Patient identity confirmed:  Arm band and verbally with patient Anesthesia:    Anesthesia method:  Local infiltration   Local anesthetic:  Lidocaine 2% WITH epi Laceration details:    Location:  Scalp   Scalp location:  Frontal   Length (cm):  3 Pre-procedure details:    Preparation:  Patient was prepped and draped in usual sterile fashion and imaging obtained to evaluate for foreign bodies Exploration:    Limited defect created (wound  extended): no     Hemostasis achieved with:  Direct pressure and epinephrine   Imaging obtained comment:  CT   Imaging outcome: foreign body not noted     Wound exploration: wound explored through full range of motion and entire depth of wound visualized     Wound extent: areolar tissue not violated, fascia not violated, no foreign body, no signs of injury, no nerve damage, no tendon damage, no underlying fracture and no vascular damage     Contaminated: no   Treatment:    Area cleansed with:  Saline   Amount of cleaning:  Standard   Irrigation solution:  Sterile saline   Irrigation method:  Pressure wash   Debridement:  None   Undermining:  None   Scar revision: no   Skin repair:    Repair method:  Sutures   Suture size:  5-0   Suture material:  Nylon   Suture technique:  Simple interrupted   Number of sutures:  4 Approximation:    Approximation:  Close Repair type:    Repair type:  Simple Post-procedure details:    Dressing:  Open (no dressing)   Procedure completion:  Tolerated well, no immediate complications    MEDICATIONS ORDERED IN ED: Medications  lidocaine-EPINEPHrine (XYLOCAINE W/EPI) 2 %-1:100000 (with pres) injection 20 mL (has no administration in time range)  Tdap (BOOSTRIX) injection 0.5 mL (0.5 mLs Intramuscular Given 03/04/23 1458)     IMPRESSION / MDM / ASSESSMENT AND PLAN / ED COURSE  I reviewed the triage vital signs and the nursing notes.                              72 y.o. male with past medical history of hypertension, hyperlipidemia, COPD, bipolar disorder, and schizophrenia who presents to the ED following fall.  Patient's presentation is most consistent with acute presentation with potential threat to life or bodily function.  Differential diagnosis includes, but is not limited to, intracranial hemorrhage, cervical spine injury, scalp laceration, scalp hematoma, extremity injury.  Patient nontoxic-appearing and in no acute distress, vital  signs are unremarkable.  He has an irregular laceration over his right frontal scalp that will require repair, we will update his tetanus status as well.  CT head is negative for acute process, CT cervical spine pending at this time.  No evidence of traumatic injury to his trunk or extremities, patient at his reported baseline mental status.  EKG shows no evidence of arrhythmia or ischemia, labs are reassuring with no significant anemia, leukocytosis, tract abnormality, or AKI.  Urinalysis also shows no signs of infection.  CT cervical spine negative for acute process, laceration  repaired without difficulty.  In further discussion with staff at patient's group home, patient apparently had a syncopal episode today, causing him to fall forward and strike his face.  Group home staff is requesting that patient be admitted for further syncopal workup, which is reasonable.  We will add on troponin, case discussed with hospitalist for admission.      FINAL CLINICAL IMPRESSION(S) / ED DIAGNOSES   Final diagnoses:  Fall, initial encounter  Laceration of scalp, initial encounter     Rx / DC Orders   ED Discharge Orders     None        Note:  This document was prepared using Dragon voice recognition software and may include unintentional dictation errors.   Chesley Noon, MD 03/04/23 1611    Chesley Noon, MD 03/04/23 [phone]

## 2023-03-05 ENCOUNTER — Encounter: Payer: Self-pay | Admitting: Internal Medicine

## 2023-03-05 ENCOUNTER — Other Ambulatory Visit: Payer: Self-pay

## 2023-03-05 ENCOUNTER — Observation Stay: Payer: Medicare HMO

## 2023-03-05 ENCOUNTER — Observation Stay (HOSPITAL_BASED_OUTPATIENT_CLINIC_OR_DEPARTMENT_OTHER)
Admit: 2023-03-05 | Discharge: 2023-03-05 | Disposition: A | Discharge status: Home / Self Care | Payer: Medicare HMO | Attending: Internal Medicine | Admitting: Internal Medicine

## 2023-03-05 DIAGNOSIS — R55 Syncope and collapse: Secondary | ICD-10-CM

## 2023-03-05 DIAGNOSIS — E039 Hypothyroidism, unspecified: Secondary | ICD-10-CM | POA: Diagnosis present

## 2023-03-05 LAB — CBC
HCT: 36.9 % — ABNORMAL LOW (ref 39.0–52.0)
Hemoglobin: 12.3 g/dL — ABNORMAL LOW (ref 13.0–17.0)
MCH: 30 pg (ref 26.0–34.0)
MCHC: 33.3 g/dL (ref 30.0–36.0)
MCV: 90 fL (ref 80.0–100.0)
Platelets: 123 10*3/uL — ABNORMAL LOW (ref 150–400)
RBC: 4.1 MIL/uL — ABNORMAL LOW (ref 4.22–5.81)
RDW: 13.5 % (ref 11.5–15.5)
WBC: 8.2 10*3/uL (ref 4.0–10.5)
nRBC: 0 % (ref 0.0–0.2)

## 2023-03-05 LAB — CBG MONITORING, ED: Glucose-Capillary: 106 mg/dL — ABNORMAL HIGH (ref 70–99)

## 2023-03-05 LAB — BASIC METABOLIC PANEL
Anion gap: 8 (ref 5–15)
BUN: 14 mg/dL (ref 8–23)
CO2: 26 mmol/L (ref 22–32)
Calcium: 9.3 mg/dL (ref 8.9–10.3)
Chloride: 105 mmol/L (ref 98–111)
Creatinine, Ser: 0.89 mg/dL (ref 0.61–1.24)
GFR, Estimated: >60 mL/min (ref >60)
Glucose, Bld: 111 mg/dL — ABNORMAL HIGH (ref 70–99)
Potassium: 3.8 mmol/L (ref 3.5–5.1)
Sodium: 139 mmol/L (ref 135–145)

## 2023-03-05 LAB — HEPATIC FUNCTION PANEL
ALT: 10 U/L (ref 0–44)
AST: 12 U/L — ABNORMAL LOW (ref 15–41)
Albumin: 3.6 g/dL (ref 3.5–5.0)
Alkaline Phosphatase: 69 U/L (ref 38–126)
Bilirubin, Direct: 0.2 mg/dL (ref 0.0–0.2)
Indirect Bilirubin: 0.9 mg/dL (ref 0.3–0.9)
Total Bilirubin: 1.1 mg/dL (ref 0.3–1.2)
Total Protein: 7 g/dL (ref 6.5–8.1)

## 2023-03-05 LAB — ETHANOL: Alcohol, Ethyl (B): <10 mg/dL (ref <10)

## 2023-03-05 LAB — BLOOD GAS, VENOUS
Acid-Base Excess: 3.7 mmol/L — ABNORMAL HIGH (ref 0.0–2.0)
Bicarbonate: 28.5 mmol/L — ABNORMAL HIGH (ref 20.0–28.0)
O2 Saturation: 93.9 %
Patient temperature: 37
pCO2, Ven: 43 mmHg — ABNORMAL LOW (ref 44–60)
pH, Ven: 7.43 (ref 7.25–7.43)
pO2, Ven: 61 mmHg — ABNORMAL HIGH (ref 32–45)

## 2023-03-05 LAB — GLUCOSE, CAPILLARY: Glucose-Capillary: 111 mg/dL — ABNORMAL HIGH (ref 70–99)

## 2023-03-05 LAB — ECHOCARDIOGRAM COMPLETE
AR max vel: 1.76 cm2
AV Area VTI: 1.61 cm2
AV Area mean vel: 1.63 cm2
AV Mean grad: 15 mmHg
AV Peak grad: 27.9 mmHg
Ao pk vel: 2.64 m/s
Height: 69 in
S' Lateral: 2.4 cm
Weight: 3168 [oz_av]

## 2023-03-05 LAB — LIPID PANEL
Cholesterol: 126 mg/dL (ref 0–200)
HDL: 40 mg/dL — ABNORMAL LOW (ref >40)
LDL Cholesterol: 77 mg/dL (ref 0–99)
Total CHOL/HDL Ratio: 3.2 RATIO
Triglycerides: 47 mg/dL (ref <150)
VLDL: 9 mg/dL (ref 0–40)

## 2023-03-05 LAB — VITAMIN B12: Vitamin B-12: 1093 pg/mL — ABNORMAL HIGH (ref 180–914)

## 2023-03-05 MED ORDER — LISINOPRIL 5 MG PO TABS
5.0000 mg | ORAL_TABLET | Freq: Every day | ORAL | Status: DC
  Administered 2023-03-05 – 2023-03-14 (×10): 5 mg via ORAL
  Filled 2023-03-05 (×10): qty 1

## 2023-03-05 MED ORDER — GADOBUTROL 1 MMOL/ML IV SOLN
8.0000 mL | Freq: Once | INTRAVENOUS | Status: AC | PRN
  Administered 2023-03-05: 8 mL via INTRAVENOUS

## 2023-03-05 MED ORDER — HYDRALAZINE HCL 20 MG/ML IJ SOLN: 10.0000 mg | Freq: Four times a day (QID) | INTRAMUSCULAR | Status: DC | PRN

## 2023-03-05 MED ORDER — LACTATED RINGERS IV SOLN: Freq: Once | INTRAVENOUS | Status: AC

## 2023-03-05 MED ORDER — AMLODIPINE BESYLATE 10 MG PO TABS
10.0000 mg | ORAL_TABLET | Freq: Every day | ORAL | Status: DC
  Administered 2023-03-05 – 2023-03-14 (×10): 10 mg via ORAL
  Filled 2023-03-05 (×10): qty 1

## 2023-03-05 MED ORDER — LEVOTHYROXINE SODIUM 25 MCG PO TABS
25.0000 ug | ORAL_TABLET | Freq: Every day | ORAL | Status: DC
  Administered 2023-03-05 – 2023-03-12 (×8): 25 ug via ORAL
  Filled 2023-03-05 (×8): qty 1

## 2023-03-05 NOTE — Assessment & Plan Note (Addendum)
Vitals:   03/04/23 1145 03/04/23 1614 03/04/23 2235 03/05/23 0000  BP: 123/67 (!) 153/75 (!) 158/91 133/86   03/05/23 0030 03/05/23 0115  BP: (!) 156/68 (!) 160/100  Suspect orthostatic component to his blood pressure and we will recheck.  Fall precautions, aspiration precautions. As needed hydralazine for blood pressures. We will get Mri brain Non contrast MRA neck.

## 2023-03-05 NOTE — Assessment & Plan Note (Signed)
Stable. PRN albuterol. Oxygen and additional therapy as needed.

## 2023-03-05 NOTE — Progress Notes (Signed)
*  PRELIMINARY RESULTS* Echocardiogram 2D Echocardiogram has been performed.  [name] 03/05/2023, 4:12 PM

## 2023-03-05 NOTE — Evaluation (Signed)
Physical Therapy Evaluation Patient Details Name: [name] MRN: [id number] DOB: Jan 03, 1951 Today's Date: 03/05/2023  History of Present Illness  Patient is a 72 year old male from group home with unwitnessed fall with possible syncopal episode, striking his head. History of hypertension, hyperlipidemia, COPD, bipolar disorder, and schizophrenia. MRI of brain with no acute intracranial pathology   Clinical Impression  Patient was agreeable to PT evaluation. He is a poor historian but reports he uses a walker at times for mobility and intermittently requires assistance with mobility. Chart indicates patient is from a group home. He reports no other falls but does report feeling dizzy prior to most recent fall.  Orthostatic vitals were taken during session and patient reports mild dizziness with sitting upright and with standing. Two bouts of standing performed with Min A required. Patient is able to march in place using rolling walker but declined walking due to feeling dizzy and feeling generally weak. Standing tolerance of 3-4 minutes.  Recommend PT follow up to maximize independence and decrease caregiver burden. Anticipate patient may do better in familiar environment.   Orthostatic Vitals taken during session:   BP Supine- 134/72, pulse 74 BP Sitting- 127/65, pulse 92 BP Standing 0 minutes- 94/62, pulse 120 BP Standing 3 minutes- 108/67, pulse 124        If plan is discharge home, recommend the following: A little help with walking and/or transfers;A little help with bathing/dressing/bathroom;Assistance with cooking/housework;Supervision due to cognitive status;Help with stairs or ramp for entrance;Assist for transportation   Can travel by private vehicle        Equipment Recommendations None recommended by PT  Recommendations for Other Services       Functional Status Assessment Patient has had a recent decline in their functional status and demonstrates the ability to make  significant improvements in function in a reasonable and predictable amount of time.     Precautions / Restrictions Precautions Precautions: Fall Restrictions Weight Bearing Restrictions: No      Mobility  Bed Mobility Overal bed mobility: Needs Assistance Bed Mobility: Supine to Sit, Sit to Supine     Supine to sit: Supervision, HOB elevated Sit to supine: Supervision, HOB elevated   General bed mobility comments: cues for task initiation    Transfers Overall transfer level: Needs assistance Equipment used: Rolling walker (2 wheels) Transfers: Sit to/from Stand Sit to Stand: Min assist, Contact guard assist           General transfer comment: 2 standing bouts performed. CGA for first stand and Min A for second stand.    Ambulation/Gait             Pre-gait activities: patient is able to march in place for ~ 20 seconds using rolling walker with CGA. patient declined walking and reports mild dizziness with standing. + orthostatic hypotension    Stairs            Wheelchair Mobility     Tilt Bed    Modified Rankin (Stroke Patients Only)       Balance Overall balance assessment: Needs assistance Sitting-balance support: Feet supported Sitting balance-Leahy Scale: Good     Standing balance support: Bilateral upper extremity supported Standing balance-Leahy Scale: Fair Standing balance comment: using rolling walker for support                             Pertinent Vitals/Pain      Home Living Family/patient expects to be  discharged to:: Group home                        Prior Function Prior Level of Function : Patient poor historian/Family not available             Mobility Comments: patient reports he uses a walker at times and has assist at times with mobility.       Extremity/Trunk Assessment   Upper Extremity Assessment Upper Extremity Assessment: Overall WFL for tasks assessed    Lower Extremity  Assessment Lower Extremity Assessment: Generalized weakness       Communication   Communication Communication: Difficulty communicating thoughts/reduced clarity of speech;Difficulty following commands/understanding Following commands: Follows one step commands with increased time Cueing Techniques: Verbal cues;Tactile cues  Cognition Arousal: Alert Behavior During Therapy: WFL for tasks assessed/performed Overall Cognitive Status: No family/caregiver present to determine baseline cognitive functioning                                 General Comments: patient is able to follow single step commands with increased time. oriented to person, situation.        General Comments      Exercises     Assessment/Plan    PT Assessment Patient needs continued PT services  PT Problem List Decreased strength;Decreased range of motion;Decreased activity tolerance;Decreased balance;Decreased mobility       PT Treatment Interventions DME instruction;Gait training;Functional mobility training;Stair training;Therapeutic activities;Therapeutic exercise;Balance training;Cognitive remediation    PT Goals (Current goals can be found in the Care Plan section)  Acute Rehab PT Goals Patient Stated Goal: none stated PT Goal Formulation: Patient unable to participate in goal setting Time For Goal Achievement: 03/19/23 Potential to Achieve Goals: Fair    Frequency Min 1X/week     Co-evaluation               AM-PAC PT "6 Clicks" Mobility  Outcome Measure Help needed turning from your back to your side while in a flat bed without using bedrails?: A Little Help needed moving from lying on your back to sitting on the side of a flat bed without using bedrails?: A Little Help needed moving to and from a bed to a chair (including a wheelchair)?: A Little Help needed standing up from a chair using your arms (e.g., wheelchair or bedside chair)?: A Little Help needed to walk in  hospital room?: A Little Help needed climbing 3-5 steps with a railing? : A Little 6 Click Score: 18    End of Session   Activity Tolerance: Patient limited by fatigue Patient left: in bed;with call bell/phone within reach;with bed alarm set Nurse Communication: Mobility status PT Visit Diagnosis: Unsteadiness on feet (R26.81);Muscle weakness (generalized) (M62.81)    Time: 4098-1191 PT Time Calculation (min) (ACUTE ONLY): 26 min   Charges:   PT Evaluation $PT Eval Low Complexity: 1 Low PT Treatments $Therapeutic Activity: 8-22 mins PT General Charges $$ ACUTE PT VISIT: 1 Visit         Donna Bernard, PT, MPT   Ina Homes 03/05/2023, 1:42 PM

## 2023-03-05 NOTE — ED Notes (Signed)
Pt in MRI.

## 2023-03-05 NOTE — Assessment & Plan Note (Signed)
Vitals:   03/04/23 1145 03/04/23 1614 03/04/23 2235 03/05/23 0000  BP: 123/67 (!) 153/75 (!) 158/91 133/86   03/05/23 0030 03/05/23 0115  BP: (!) 156/68 (!) 160/100  We will continue pt on amlodipine 10 and lisinopril 5 mg.

## 2023-03-05 NOTE — Assessment & Plan Note (Signed)
Will continue patient on his home regimen of Celexa Prolixin Risperdal and trazodone.

## 2023-03-05 NOTE — Assessment & Plan Note (Signed)
Fall precaution. PT consult prior to Discharge. Orthostatic vitals prior to discharge.

## 2023-03-05 NOTE — Assessment & Plan Note (Signed)
IV ppi therapy.  Aspiration precaution.

## 2023-03-05 NOTE — Assessment & Plan Note (Signed)
Patient is currently on Zocor which we will continue.

## 2023-03-05 NOTE — Assessment & Plan Note (Signed)
Start pt on levothyroxine 25 mcg and PCP to manage up titration of dose.

## 2023-03-05 NOTE — ED Notes (Signed)
Patient to MRI at this time.

## 2023-03-05 NOTE — Progress Notes (Signed)
Progress Note   Patient: [name] ZOX:[id number] DOB: 02/19/1951 DOA: 03/04/2023     0 DOS: the patient was seen and examined on 03/05/2023   Brief hospital course:   72 y.o. male with medical history significant for hypertension, hyperlipidemia, COPD, bipolar disorder, schizophrenia, presenting for fall today. Pt had fall on his face and has laceration that is repaired in ed. Admission for syncope/ dizziness/ falls. Initial vitals show blood pressure 139/80 heart rate of 70 O2 sats of 96% on room air. The emergency room EKG sinus rhythm with 88 left axis deviation no ST-T wave changes.  Patient is in no distress at this time alert awake oriented cooperative nonfocal answers questions has mild dementia he is already oriented to self but not location.  States that he does at his house.  States he not just to see here.  In the emergency room head CT done noncontrast is negative for any acute process, CT spine negative for any acute processes laceration was repaired. In the emergency room patient is alert awake oriented afebrile. Labs shows : glucose of 184 and  LFT pending. Cbc normal plt 132.EKG shows sinus rhythm at 88 PR 172.  QRS is 116 and LAD, QTc  459. Prior EKG showed atrial flutter. Pt sees [facility] group dr. Azucena Cecil and last 2 d echo on 12/03/2020 shows:Left ventricular ejection fraction, by estimation, is 60 to 65% . The left ventricle has normal function. The left ventricle has no regional wall motion abnormalities. There is mild left ventricular hypertrophy. Left ventricular diastolic parameters were normal.  Right ventricular systolic function is normal. The right ventricular size is normal. The mitral valve is normal in structure. Trivial mitral valve regurgitation. The aortic valve is calcified. Aortic valve regurgitation is not visualized. Mild aortic valve stenosis.  8/16 : Patient was evaluated with MRI of the brain with no acute findings.  Had increased heart rate with mild drop in  blood pressure concern for orthostasis but not meeting the criteria.  Could be secondary to dehydration.  PT evaluating patient.  Assessment and Plan: * Syncope and collapse  Suspect orthostatic component to his blood pressure and we will recheck.   Fall precautions, aspiration precautions. As needed hydralazine for blood pressures. MRI brain no acute finding.  Hypothyroidism Start pt on levothyroxine 25 mcg and PCP to manage up titration of dose.   Unsteady gait Fall precaution. PT consult prior to Discharge. Orthostatic vitals prior to discharge.   Schizophrenia Will continue patient on his home regimen of Celexa Prolixin Risperdal and trazodone.  COPD (chronic obstructive pulmonary disease)  Stable. PRN albuterol. Oxygen and additional therapy as needed.   Hyperlipidemia Patient is currently on Zocor which we will continue.  GERD (gastroesophageal reflux disease) IV ppi therapy.  Aspiration precaution.   Essential hypertension Vitals:   03/04/23 1145 03/04/23 1614 03/04/23 2235 03/05/23 0000  BP: 123/67 (!) 153/75 (!) 158/91 133/86   03/05/23 0030 03/05/23 0115  BP: (!) 156/68 (!) 160/100  We will continue pt on amlodipine 10 and lisinopril 5 mg.      Subjective: Patient seen and examined by bedside.  Sitting in bed no active complaint.  Physical Exam: Vitals:   03/05/23 0314 03/05/23 0451 03/05/23 0600 03/05/23 0801  BP: 125/69 (!) 147/70 131/69 139/78  Pulse: 75 76 70 77  Resp: 15 15 16 18   Temp:    98.7 F (37.1 C)  TempSrc:    Oral  SpO2: 97% 98% 98% 95%  Height:   5'  9" (1.753 m)     : Head normal cephalic atraumatic, eyes extraocular movement intact.  Cardiovascular S1-S2 regular with no grade 3 murmur, lungs with equal air entry bilaterally, abdomen with normal bowel sounds, musculoskeletal no edema, skin with warmth.  Neurologically no focal deficit.  Able to provide intact facial symmetry preserved.  Mood on the quieter side.  Cooperative  behavior Data Reviewed:  There are no new results to review at this time.  Family Communication: None by bedside  Disposition: Status is: Observation The patient remains OBS appropriate and will d/c before 2 midnights.  Planned Discharge Destination: Home    Time spent: 31 minutes  Author: Kirstie Peri, MD 03/05/2023 2:01 PM  For on call review [email or web address].

## 2023-03-06 ENCOUNTER — Encounter: Payer: Self-pay | Admitting: Internal Medicine

## 2023-03-06 DIAGNOSIS — R55 Syncope and collapse: Secondary | ICD-10-CM | POA: Diagnosis not present

## 2023-03-06 LAB — GLUCOSE, CAPILLARY
Glucose-Capillary: 79 mg/dL (ref 70–99)
Glucose-Capillary: 113 mg/dL — ABNORMAL HIGH (ref 70–99)
Glucose-Capillary: 114 mg/dL — ABNORMAL HIGH (ref 70–99)
Glucose-Capillary: 122 mg/dL — ABNORMAL HIGH (ref 70–99)
Glucose-Capillary: 115 mg/dL — ABNORMAL HIGH (ref 70–99)

## 2023-03-06 LAB — COMPREHENSIVE METABOLIC PANEL
ALT: 8 U/L (ref 0–44)
AST: 11 U/L — ABNORMAL LOW (ref 15–41)
Albumin: 3.6 g/dL (ref 3.5–5.0)
Alkaline Phosphatase: 65 U/L (ref 38–126)
Anion gap: 7 (ref 5–15)
BUN: 11 mg/dL (ref 8–23)
CO2: 24 mmol/L (ref 22–32)
Calcium: 9.4 mg/dL (ref 8.9–10.3)
Chloride: 106 mmol/L (ref 98–111)
Creatinine, Ser: 0.86 mg/dL (ref 0.61–1.24)
GFR, Estimated: >60 mL/min (ref >60)
Glucose, Bld: 76 mg/dL (ref 70–99)
Potassium: 3.6 mmol/L (ref 3.5–5.1)
Sodium: 137 mmol/L (ref 135–145)
Total Bilirubin: 1.5 mg/dL — ABNORMAL HIGH (ref 0.3–1.2)
Total Protein: 6.9 g/dL (ref 6.5–8.1)

## 2023-03-06 LAB — CBC
HCT: 36.1 % — ABNORMAL LOW (ref 39.0–52.0)
Hemoglobin: 12.1 g/dL — ABNORMAL LOW (ref 13.0–17.0)
MCH: 29.9 pg (ref 26.0–34.0)
MCHC: 33.5 g/dL (ref 30.0–36.0)
MCV: 89.1 fL (ref 80.0–100.0)
Platelets: 112 10*3/uL — ABNORMAL LOW (ref 150–400)
RBC: 4.05 MIL/uL — ABNORMAL LOW (ref 4.22–5.81)
RDW: 13.5 % (ref 11.5–15.5)
WBC: 8 10*3/uL (ref 4.0–10.5)
nRBC: 0 % (ref 0.0–0.2)

## 2023-03-06 NOTE — TOC Progression Note (Signed)
Transition of Care [facility]) - Progression Note    Patient Details  Name: [name] MRN: [id number] Date of Birth: 03-09-1951  [facility]) CM/SW Contact  Allena Katz, LCSW Phone Number: 03/06/2023, 10:30 AM  Clinical Narrative:   CSW spoke with Timmy at patients group home who reports that pt cannot come back until Monday. Timmy states pt was fully independent and wants to see him walking more before return and also wants to talk with their MD.          Expected Discharge Plan and Services                                               Social Determinants of Health (SDOH) Interventions SDOH Screenings   Food Insecurity: Patient Unable To Answer (03/05/2023)  Housing: Patient Unable To Answer (03/05/2023)  Transportation Needs: Patient Unable To Answer (03/05/2023)  Utilities: Patient Unable To Answer (03/05/2023)  Depression (PHQ2-9): Low Risk  (12/12/2020)  Tobacco Use: High Risk (03/05/2023)    Readmission Risk Interventions     No data to display

## 2023-03-06 NOTE — Progress Notes (Signed)
Progress Note   Patient: [name] VOZ:[id number] DOB: [birth year] DOA: 03/04/2023     0 DOS: the patient was seen and examined on 03/06/2023   Brief hospital course:   72 y.o. male with medical history significant for hypertension, hyperlipidemia, COPD, bipolar disorder, schizophrenia, presenting for fall today. Pt had fall on his face and has laceration that is repaired in ed. Admission for syncope/ dizziness/ falls. Initial vitals show blood pressure 139/80 heart rate of 70 O2 sats of 96% on room air. The emergency room EKG sinus rhythm with 88 left axis deviation no ST-T wave changes.  Patient is in no distress at this time alert awake oriented cooperative nonfocal answers questions has mild dementia he is already oriented to self but not location.  States that he does at his house.  States he not just to see here.  In the emergency room head CT done noncontrast is negative for any acute process, CT spine negative for any acute processes laceration was repaired. In the emergency room patient is alert awake oriented afebrile. Labs shows : glucose of 184 and  LFT pending. Cbc normal plt 132.EKG shows sinus rhythm at 88 PR 172.  QRS is 116 and LAD, QTc  459. Prior EKG showed atrial flutter. Pt sees [facility] group dr. Azucena Cecil and last 2 d echo on 12/03/2020 shows:Left ventricular ejection fraction, by estimation, is 60 to 65% . The left ventricle has normal function. The left ventricle has no regional wall motion abnormalities. There is mild left ventricular hypertrophy. Left ventricular diastolic parameters were normal.  Right ventricular systolic function is normal. The right ventricular size is normal. The mitral valve is normal in structure. Trivial mitral valve regurgitation. The aortic valve is calcified. Aortic valve regurgitation is not visualized. Mild aortic valve stenosis.  8/16 : Patient was evaluated with MRI of the brain with no acute findings.  Had increased heart rate with mild drop in  blood pressure concern for orthostasis but not meeting the criteria.  Could be secondary to dehydration.  PT evaluating patient.  8/17 : Patient's MRI with no acute finding, echo within normal limits.  Patient workup has been so far unremarkable.  Patient symptomatically feeling better.  Slow to respond.  Pending acceptance at group home.  Assessment and Plan: * Syncope and collapse  Suspect orthostatic component to his blood pressure and we will recheck.   Fall precautions, aspiration precautions. As needed hydralazine for blood pressures. MRI brain no acute finding.  Hypothyroidism Start pt on levothyroxine 25 mcg and PCP to manage up titration of dose.   Unsteady gait Fall precaution. PT consult prior to Discharge. Orthostatic vitals prior to discharge.   Schizophrenia Will continue patient on his home regimen of Celexa Prolixin Risperdal and trazodone.  COPD (chronic obstructive pulmonary disease)  Stable. PRN albuterol. Oxygen and additional therapy as needed.   Hyperlipidemia Patient is currently on Zocor which we will continue.  GERD (gastroesophageal reflux disease) IV ppi therapy.  Aspiration precaution.   Essential hypertension Vitals:   03/04/23 1145 03/04/23 1614 03/04/23 2235 03/05/23 0000  BP: 123/67 (!) 153/75 (!) 158/91 133/86   03/05/23 0030 03/05/23 0115  BP: (!) 156/68 (!) 160/100  We will continue pt on amlodipine 10 and lisinopril 5 mg.      Subjective: Patient seen and examined by bedside.  Sitting in bed no active complaint.  Slow to respond.  No overnight events.  Physical Exam: Vitals:   03/05/23 2356 03/06/23 0534 03/06/23 0921 03/06/23 1149  BP: 122/81 125/65 132/68 (!) 125/59  Pulse: 78 72 66 81  Resp: 16 16 16 16   Temp: 98.7 F (37.1 C) 99.1 F (37.3 C) 98.7 F (37.1 C) 98.8 F (37.1 C)  TempSrc: Oral Oral    SpO2: 97% 98% 97% 97%  Height:        : Head normal cephalic atraumatic, eyes extraocular movement intact.   Cardiovascular S1-S2 regular with no grade 3 murmur, lungs with equal air entry bilaterally, abdomen with normal bowel sounds, musculoskeletal no edema, skin with warmth.  Neurologically no focal deficit.  Able to provide intact facial symmetry preserved.  Mood on the quieter side.  Cooperative behavior Data Reviewed:  There are no new results to review at this time.  Family Communication: None by bedside  Disposition: Status is: Observation The patient remains OBS appropriate and will d/c before 2 midnights.  Planned Discharge Destination: Home    Time spent: 31 minutes  Author: Kirstie Peri, MD 03/06/2023 1:19 PM  For on call review [email or web address].

## 2023-03-06 NOTE — Plan of Care (Signed)

## 2023-03-07 DIAGNOSIS — R55 Syncope and collapse: Secondary | ICD-10-CM | POA: Diagnosis not present

## 2023-03-07 LAB — GLUCOSE, CAPILLARY
Glucose-Capillary: 102 mg/dL — ABNORMAL HIGH (ref 70–99)
Glucose-Capillary: 147 mg/dL — ABNORMAL HIGH (ref 70–99)

## 2023-03-07 NOTE — Progress Notes (Signed)
Progress Note   Patient: [name] WUJ:[id number] DOB: 11/18/1950 DOA: 03/04/2023     0 DOS: the patient was seen and examined on 03/07/2023   Brief hospital course:   72 y.o. male with medical history significant for hypertension, hyperlipidemia, COPD, bipolar disorder, schizophrenia, presenting for fall today. Pt had fall on his face and has laceration that is repaired in ed. Admission for syncope/ dizziness/ falls. Initial vitals show blood pressure 139/80 heart rate of 70 O2 sats of 96% on room air. The emergency room EKG sinus rhythm with 88 left axis deviation no ST-T wave changes.  Patient is in no distress at this time alert awake oriented cooperative nonfocal answers questions has mild dementia he is already oriented to self but not location.  States that he does at his house.  States he not just to see here.  In the emergency room head CT done noncontrast is negative for any acute process, CT spine negative for any acute processes laceration was repaired. In the emergency room patient is alert awake oriented afebrile. Labs shows : glucose of 184 and  LFT pending. Cbc normal plt 132.EKG shows sinus rhythm at 88 PR 172.  QRS is 116 and LAD, QTc  459. Prior EKG showed atrial flutter. Pt sees [facility] group dr. Azucena Cecil and last 2 d echo on 12/03/2020 shows:Left ventricular ejection fraction, by estimation, is 60 to 65% . The left ventricle has normal function. The left ventricle has no regional wall motion abnormalities. There is mild left ventricular hypertrophy. Left ventricular diastolic parameters were normal.  Right ventricular systolic function is normal. The right ventricular size is normal. The mitral valve is normal in structure. Trivial mitral valve regurgitation. The aortic valve is calcified. Aortic valve regurgitation is not visualized. Mild aortic valve stenosis.  8/16 : Patient was evaluated with MRI of the brain with no acute findings.  Had increased heart rate with mild drop in  blood pressure concern for orthostasis but not meeting the criteria.  Could be secondary to dehydration.  PT evaluating patient.  8/17 : Patient's MRI with no acute finding, echo within normal limits.  Patient workup has been so far unremarkable.  Patient symptomatically feeling better.  Slow to respond.  Pending acceptance at group home.  8/18 : No change in status.  Patient to be discharged Monday to group home.  Assessment and Plan: * Syncope and collapse  Suspect orthostatic component to his blood pressure and we will recheck.   Fall precautions, aspiration precautions. As needed hydralazine for blood pressures. MRI brain no acute finding.  Hypothyroidism Start pt on levothyroxine 25 mcg and PCP to manage up titration of dose.   Unsteady gait Fall precaution. PT consult prior to Discharge. Orthostatic vitals prior to discharge.   Schizophrenia Will continue patient on his home regimen of Celexa Prolixin Risperdal and trazodone.  COPD (chronic obstructive pulmonary disease)  Stable. PRN albuterol. Oxygen and additional therapy as needed.   Hyperlipidemia Patient is currently on Zocor which we will continue.  GERD (gastroesophageal reflux disease) IV ppi therapy.  Aspiration precaution.   Essential hypertension Vitals:   03/04/23 1145 03/04/23 1614 03/04/23 2235 03/05/23 0000  BP: 123/67 (!) 153/75 (!) 158/91 133/86   03/05/23 0030 03/05/23 0115  BP: (!) 156/68 (!) 160/100  We will continue pt on amlodipine 10 and lisinopril 5 mg.      Subjective: Patient seen and examined by bedside.  Eating and better with no active complaint.  Slow to respond.  No  overnight events.  Physical Exam: Vitals:   03/07/23 0500 03/07/23 0600 03/07/23 0822 03/07/23 1144  BP:   130/66 121/64  Pulse:   78 91  Resp:  17 16 16   Temp:   98.6 F (37 C) 98.7 F (37.1 C)  TempSrc:   Oral Oral  SpO2:   94% 96%  Weight: 83.9 kg     Height:        : Head normal cephalic atraumatic,  eyes extraocular movement intact.  Cardiovascular S1-S2 regular with no grade 3 murmur, lungs with equal air entry bilaterally, abdomen with normal bowel sounds, musculoskeletal no edema, skin with warmth.  Neurologically no focal deficit.  Able to provide intact facial symmetry preserved.  Mood on the quieter side.  Cooperative behavior Data Reviewed:  There are no new results to review at this time.  Family Communication: None by bedside  Disposition: Status is: Observation The patient remains OBS appropriate and will d/c before 2 midnights.  Planned Discharge Destination: Home    Time spent: 31 minutes  Author: Kirstie Peri, MD 03/07/2023 1:50 PM  For on call review [email or web address].

## 2023-03-08 DIAGNOSIS — I10 Essential (primary) hypertension: Secondary | ICD-10-CM

## 2023-03-08 DIAGNOSIS — R2681 Unsteadiness on feet: Secondary | ICD-10-CM

## 2023-03-08 DIAGNOSIS — F200 Paranoid schizophrenia: Secondary | ICD-10-CM

## 2023-03-08 DIAGNOSIS — R55 Syncope and collapse: Secondary | ICD-10-CM | POA: Diagnosis not present

## 2023-03-08 DIAGNOSIS — E782 Mixed hyperlipidemia: Secondary | ICD-10-CM

## 2023-03-08 DIAGNOSIS — J449 Chronic obstructive pulmonary disease, unspecified: Secondary | ICD-10-CM | POA: Diagnosis not present

## 2023-03-08 DIAGNOSIS — E039 Hypothyroidism, unspecified: Secondary | ICD-10-CM

## 2023-03-08 NOTE — TOC Progression Note (Signed)
Transition of Care [facility]) - Progression Note    Patient Details  Name: [name] MRN: [id number] Date of Birth: 1951-03-14  [facility] CM/SW Contact  Allena Katz, LCSW Phone Number: 03/08/2023, 9:58 AM  Clinical Narrative:   PT to reassess patient to see if he is able to walk as he was orthostatic when evaluating.          Expected Discharge Plan and Services                                               Social Determinants of Health (SDOH) Interventions SDOH Screenings   Food Insecurity: Patient Unable To Answer (03/05/2023)  Housing: Patient Unable To Answer (03/05/2023)  Transportation Needs: Patient Unable To Answer (03/05/2023)  Utilities: Patient Unable To Answer (03/05/2023)  Depression (PHQ2-9): Low Risk  (12/12/2020)  Tobacco Use: High Risk (03/05/2023)    Readmission Risk Interventions     No data to display

## 2023-03-08 NOTE — Progress Notes (Signed)
Physical Therapy Treatment Patient Details Name: [name] MRN: [id number] DOB: September 23, 1950 Today's Date: 03/08/2023   History of Present Illness Patient is a 72 year old male from group home with unwitnessed fall with possible syncopal episode, striking his head. History of hypertension, hyperlipidemia, COPD, bipolar disorder, and schizophrenia. MRI of brain with no acute intracranial pathology    PT Comments  Pt is steadily progressing towards PT goals. Pt is received at EOB, he is agreeable to PT session. Pt performs bed mobility supervision assist, transfers and amb min A using RW. Pt reports mild dizziness and generalized weakness at the beginning of session. Pt able to amb approx 10 ft using RW and cuing to promote upright standing posture, reduce shuffling gait, and promote a longer stride length. Pt limited in amb distance due to reports of dizziness. Additional, Pt able to perform bed exercises to promote BLE strength. Pt in bed at the end of session with nursing call bell within reach. Pt would benefit from cont skilled PT to address above deficits and promote optimal return to PLOF.      If plan is discharge home, recommend the following: A little help with walking and/or transfers;A little help with bathing/dressing/bathroom;Assistance with cooking/housework;Supervision due to cognitive status;Help with stairs or ramp for entrance;Assist for transportation   Can travel by private vehicle        Equipment Recommendations  None recommended by PT    Recommendations for Other Services       Precautions / Restrictions Precautions Precautions: Fall Restrictions Weight Bearing Restrictions: No     Mobility  Bed Mobility Overal bed mobility: Modified Independent Bed Mobility: Sit to Supine       Sit to supine: HOB elevated, Supervision   General bed mobility comments: min cuing required for hand placement    Transfers Overall transfer level: Needs  assistance Equipment used: Rolling walker (2 wheels) Transfers: Sit to/from Stand Sit to Stand: Min assist           General transfer comment: Cuing for shifting anterior to facilitate STS from bed CGA    Ambulation/Gait Ambulation/Gait assistance: Min assist Gait Distance (Feet): 10 Feet Assistive device: Rolling walker (2 wheels) Gait Pattern/deviations: Decreased step length - right, Decreased step length - left, Step-through pattern, Shuffle, Decreased stride length Gait velocity: Decreased     General Gait Details: Cuing to maintain trunk close to RW and encouragement to increase stride length   Stairs             Wheelchair Mobility     Tilt Bed    Modified Rankin (Stroke Patients Only)       Balance Overall balance assessment: Needs assistance Sitting-balance support: Feet supported, Bilateral upper extremity supported Sitting balance-Leahy Scale: Fair Sitting balance - Comments: Able to maintain static seated balance with BUE support, occasionally 1 UE with cuing to forward lean Postural control: Posterior lean Standing balance support: Bilateral upper extremity supported, During functional activity, Reliant on assistive device for balance Standing balance-Leahy Scale: Fair Standing balance comment: Able to maintain static standing balance using RW for support and cues for upright standing posture                            Cognition Arousal: Alert Behavior During Therapy: WFL for tasks assessed/performed Overall Cognitive Status: No family/caregiver present to determine baseline cognitive functioning  General Comments: Pleasant and eager to attempt walking        Exercises Other Exercises Other Exercises: Long sitting marches x10 B, ankle pumps x10 B    General Comments        Pertinent Vitals/Pain Pain Assessment Pain Assessment: No/denies pain    Home Living                           Prior Function            PT Goals (current goals can now be found in the care plan section) Acute Rehab PT Goals Patient Stated Goal: Unable to state at this time PT Goal Formulation: Patient unable to participate in goal setting Time For Goal Achievement: 03/19/23 Potential to Achieve Goals: Fair Progress towards PT goals: Progressing toward goals    Frequency    Min 1X/week      PT Plan      Co-evaluation              AM-PAC PT "6 Clicks" Mobility   Outcome Measure  Help needed turning from your back to your side while in a flat bed without using bedrails?: A Little Help needed moving from lying on your back to sitting on the side of a flat bed without using bedrails?: A Little Help needed moving to and from a bed to a chair (including a wheelchair)?: A Little Help needed standing up from a chair using your arms (e.g., wheelchair or bedside chair)?: A Little Help needed to walk in hospital room?: A Little Help needed climbing 3-5 steps with a railing? : A Lot 6 Click Score: 17    End of Session Equipment Utilized During Treatment: Gait belt Activity Tolerance: Patient limited by fatigue;Other (comment) (Limited by symptoms of fatigue) Patient left: in bed;with call bell/phone within reach;with bed alarm set Nurse Communication: Mobility status PT Visit Diagnosis: Unsteadiness on feet (R26.81);Muscle weakness (generalized) (M62.81);Dizziness and giddiness (R42)     Time: 1610-9604 PT Time Calculation (min) (ACUTE ONLY): 14 min  Charges:                            Elmon Else, SPT    Mandalyn Pasqua 03/08/2023, 12:00 PM

## 2023-03-08 NOTE — Plan of Care (Signed)
  Problem: Education: Goal: Knowledge of condition and prescribed therapy will improve Outcome: Progressing   Problem: Cardiac: Goal: Will achieve and/or maintain adequate cardiac output Outcome: Progressing   Problem: Physical Regulation: Goal: Complications related to the disease process, condition or treatment will be avoided or minimized Outcome: Progressing   Problem: Education: Goal: Knowledge of General Education information will improve Description: Including pain rating scale, medication(s)/side effects and non-pharmacologic comfort measures Outcome: Progressing   Problem: Health Behavior/Discharge Planning: Goal: Ability to manage health-related needs will improve Outcome: Progressing   Problem: Clinical Measurements: Goal: Ability to maintain clinical measurements within normal limits will improve Outcome: Progressing Goal: Will remain free from infection Outcome: Progressing Goal: Diagnostic test results will improve Outcome: Progressing Goal: Respiratory complications will improve Outcome: Progressing Goal: Cardiovascular complication will be avoided Outcome: Progressing   Problem: Nutrition: Goal: Adequate nutrition will be maintained Outcome: Progressing   Problem: Activity: Goal: Risk for activity intolerance will decrease Outcome: Progressing

## 2023-03-08 NOTE — Progress Notes (Signed)
  Progress Note   Patient: [name] WUJ:[id number] DOB: [birth year] DOA: 03/04/2023     0 DOS: the patient was seen and examined on 03/08/2023   Brief hospital course: Keionte Kubesh is a 72 year old male with past medical history significant for hypertension, hyperlipidemia, bipolar disorder, COPD, schizophrenia, presented to emergency department for evaluation of fall.  Patient sustained laceration that was repaired in the ED.  Patient is admitted to hospitalist service for evaluation of syncope/dizziness.  Patient had imaging studies including CT head, spine did not reveal any acute process.  Patient's workup so far negative.  He is awaiting group home placement.  Assessment and Plan: * Syncope and collapse Component of orthostatic positive vitals. He did receive IV hydration. Symptoms much better. For an aspiration precautions. PT OT follow-up. TOC working on group home placement.  Hypothyroidism Continue levothyroxine 25 mcg and PCP to manage up titration of dose.   Unsteady gait Fall precaution. PT/ OT on board recommended rehab. Orthostatic vitals every shift.  Schizophrenia (HCC) Continue Celexa Prolixin Risperdal and trazodone.  COPD (chronic obstructive pulmonary disease) (HCC) No exacerbation. PRN albuterol. Oxygen via nasal cannula as needed.   Hyperlipidemia Continue Zocor  GERD (gastroesophageal reflux disease) Continue PPI. Aspiration precaution.   Essential hypertension Continue amlodipine 10 and lisinopril 5 mg.   Nursing supportive care. DVT prophy subcu heparin. CODE STATUS full code.       Subjective: Patient is seen and examined today morning.  He is lying comfortably, does not want me to disturb him.  Denies any complaints.  Physical Exam: Vitals:   03/08/23 0453 03/08/23 0747 03/08/23 1144 03/08/23 1721  BP: 124/64 121/75 118/75 118/64  Pulse: 67 61 88 77  Resp: 18 14 16 16   Temp: 98.3 F (36.8 C) 98.2 F (36.8 C) 98 F (36.7 C)  98.2 F (36.8 C)  TempSrc: Oral Oral Oral   SpO2: 93% 99% 99% 96%  Weight:      Height:       General - Elderly African-American male, no apparent distress HEENT - PERRLA, EOMI, atraumatic head, non tender sinuses. Lung - Clear, rales, rhonchi, wheezes. Heart - S1, S2 heard, no murmurs, rubs, no pedal edema Neuro - Sleeping, arousable, non focal exam. Skin - Warm and dry. Data Reviewed:  There are no new results to review at this time.  Family Communication: TOC to work with family, regarding safe dc plan.  Disposition: Status is: Observation The patient remains OBS appropriate and will d/c before 2 midnights.  Planned Discharge Destination: Rehab    Time spent: 42 minutes  Author: Marcelino Duster, MD 03/08/2023 5:31 PM  For on call review [email or web address].

## 2023-03-08 NOTE — TOC Progression Note (Signed)
Transition of Care [facility]) - Progression Note    Patient Details  Name: Tarone Halleck MRN: [id number] Date of Birth: October 02, 1950  [facility]) CM/SW Contact  Allena Katz, LCSW Phone Number: 03/08/2023, 11:24 AM  Clinical Narrative:   CSW attempted to speak to patient about rehab. Group home unable to take patient back at current level of functioning. CSW explained this to patient. Pt would not answer on if he was agreeable to rehab and continually turned the TV up when asked. CSW will follow back up with patient at a better time.          Expected Discharge Plan and Services                                               Social Determinants of Health (SDOH) Interventions SDOH Screenings   Food Insecurity: Patient Unable To Answer (03/05/2023)  Housing: Patient Unable To Answer (03/05/2023)  Transportation Needs: Patient Unable To Answer (03/05/2023)  Utilities: Patient Unable To Answer (03/05/2023)  Depression (PHQ2-9): Low Risk  (12/12/2020)  Tobacco Use: High Risk (03/05/2023)    Readmission Risk Interventions     No data to display

## 2023-03-09 DIAGNOSIS — I10 Essential (primary) hypertension: Secondary | ICD-10-CM | POA: Diagnosis not present

## 2023-03-09 DIAGNOSIS — F200 Paranoid schizophrenia: Secondary | ICD-10-CM | POA: Diagnosis not present

## 2023-03-09 DIAGNOSIS — R55 Syncope and collapse: Secondary | ICD-10-CM | POA: Diagnosis not present

## 2023-03-09 DIAGNOSIS — J449 Chronic obstructive pulmonary disease, unspecified: Secondary | ICD-10-CM | POA: Diagnosis not present

## 2023-03-09 LAB — GLUCOSE, CAPILLARY: Glucose-Capillary: 111 mg/dL — ABNORMAL HIGH (ref 70–99)

## 2023-03-09 NOTE — Progress Notes (Signed)
  Progress Note   Patient: [name] ZOX:[id number] DOB: 28-Mar-1951 DOA: 03/04/2023     0 DOS: the patient was seen and examined on 03/09/2023   Brief hospital course: [name] is a 72 year old male with past medical history significant for hypertension, hyperlipidemia, bipolar disorder, COPD, schizophrenia, presented to emergency department for evaluation of fall.  Patient sustained laceration that was repaired in the ED.  Patient is admitted to hospitalist service for evaluation of syncope/dizziness.  Patient had imaging studies including CT head, spine did not reveal any acute process.  Patient's workup so far negative.  He is awaiting group home placement.  Assessment and Plan: * Syncope and collapse Component of orthostatic positive vitals. He did receive IV hydration. Symptoms much better. For an aspiration precautions. PT OT follow-up. TOC working on group home placement.  Hypothyroidism Continue levothyroxine 25 mcg and PCP to manage up titration of dose.   Unsteady gait Fall precaution. PT/ OT on board recommended rehab. Orthostatic vitals every shift.  Schizophrenia (HCC) Continue Celexa Prolixin Risperdal and trazodone.  COPD (chronic obstructive pulmonary disease) (HCC) No exacerbation. PRN albuterol. Oxygen via nasal cannula as needed.   Hyperlipidemia Continue Zocor  GERD (gastroesophageal reflux disease) Continue PPI. Aspiration precaution.   Essential hypertension Continue amlodipine 10 and lisinopril 5 mg.   Nursing supportive care. DVT prophy subcu heparin. CODE STATUS full code.       Subjective: Patient is seen and examined today morning.  He is lying comfortably. Denies any complaints. Eating poor. Did not get out of bed.  Physical Exam: Vitals:   03/09/23 0738 03/09/23 0741 03/09/23 1115 03/09/23 1643  BP: 116/62 (!) 134/54 115/64 128/60  Pulse: 80 65 71 75  Resp:   16 16  Temp: 97.6 F (36.4 C) 98 F (36.7 C) 98.1 F (36.7 C)  98.7 F (37.1 C)  TempSrc:   Oral Oral  SpO2: 94% 95% 97% 94%  Weight:      Height:       General - Elderly African-American male, no apparent distress HEENT - PERRLA, EOMI, atraumatic head, non tender sinuses. Lung - Clear, rales, rhonchi, wheezes. Heart - S1, S2 heard, no murmurs, rubs, no pedal edema Neuro - Sleeping, arousable, non focal exam. Skin - Warm and dry. Data Reviewed:  There are no new results to review at this time.  Family Communication: patient does not have a HCPOA, legal guardian, or any family. TOC to work with APS  Disposition: Status is: Observation The patient remains OBS appropriate and will d/c before 2 midnights.  Planned Discharge Destination: Rehab APS involved.    Time spent: 40 minutes  Author: Marcelino Duster, MD 03/09/2023 7:38 PM  For on call review [email or web address].

## 2023-03-09 NOTE — Progress Notes (Signed)
Per Dr Clide Dales, dc tele monitoring

## 2023-03-09 NOTE — Progress Notes (Signed)
APS worker Optometrist with [employer]. [phone]. Need someone to call her when [facility] finds patient a place to discharge to.

## 2023-03-09 NOTE — Care Management Obs Status (Signed)
MEDICARE OBSERVATION STATUS NOTIFICATION   Patient Details  Name: [name] MRN: [id number] Date of Birth: [birth year]   Medicare Observation Status Notification Given:   attempted but pt does not have HCPOA or guardian and patient is confused.     Santia Labate Genelle Gather, LCSW 03/09/2023, 9:54 AM

## 2023-03-09 NOTE — Progress Notes (Signed)
Physical Therapy Treatment Patient Details Name: [name] MRN: [id number] DOB: [birth year] Today's Date: 03/09/2023   History of Present Illness Patient is a 72 year old male from group home with unwitnessed fall with possible syncopal episode, striking his head. History of hypertension, hyperlipidemia, COPD, bipolar disorder, and schizophrenia. MRI of brain with no acute intracranial pathology    PT Comments  Pt is progressing slowly towards PT goals. Pt is received in bed, he is flat in affect but agreeable to PT session. Pt was not able to perform bed mobility, transfers, or amb today due to min interest in PT session. Pt cont to report mild dizziness in bed. Able to perform bed exercises with frequent cuing for participation and completion of exercises. Pt would benefit from cont skilled PT to address above deficits and promote optimal return to PLOF.     If plan is discharge home, recommend the following: A little help with walking and/or transfers;A little help with bathing/dressing/bathroom;Assistance with cooking/housework;Supervision due to cognitive status;Help with stairs or ramp for entrance;Assist for transportation   Can travel by private vehicle        Equipment Recommendations  None recommended by PT    Recommendations for Other Services       Precautions / Restrictions Precautions Precautions: Fall Restrictions Weight Bearing Restrictions: No     Mobility  Bed Mobility               General bed mobility comments: NT due to Pt min interest in participation    Transfers                   General transfer comment: NT due to Pt min interest in participation    Ambulation/Gait               General Gait Details: NT due to Pt min interest in participation today   Stairs             Wheelchair Mobility     Tilt Bed    Modified Rankin (Stroke Patients Only)       Balance       Sitting balance - Comments: NT due to  Pt's limited interest in participation today       Standing balance comment: NT due to Pt's limited interest in participation today                            Cognition Arousal: Alert Behavior During Therapy: Flat affect Overall Cognitive Status: No family/caregiver present to determine baseline cognitive functioning                                 General Comments: Flat affect and not interested in PT session        Exercises Other Exercises Other Exercises: Long sitting SLR x10 B, Heel slides x10 B, ankle pumps x8 B with frequent verbal and tactile cuing    General Comments        Pertinent Vitals/Pain Pain Assessment Pain Assessment: No/denies pain    Home Living                          Prior Function            PT Goals (current goals can now be found in the care plan section) Acute Rehab PT Goals Patient Stated  Goal: Unable to state at this time PT Goal Formulation: Patient unable to participate in goal setting Time For Goal Achievement: 03/19/23 Potential to Achieve Goals: Fair Progress towards PT goals: Progressing toward goals    Frequency    Min 1X/week      PT Plan      Co-evaluation              AM-PAC PT "6 Clicks" Mobility   Outcome Measure  Help needed turning from your back to your side while in a flat bed without using bedrails?: A Little Help needed moving from lying on your back to sitting on the side of a flat bed without using bedrails?: A Little Help needed moving to and from a bed to a chair (including a wheelchair)?: A Little Help needed standing up from a chair using your arms (e.g., wheelchair or bedside chair)?: A Little Help needed to walk in hospital room?: A Lot Help needed climbing 3-5 steps with a railing? : A Lot 6 Click Score: 16    End of Session   Activity Tolerance: Patient limited by lethargy;Patient limited by fatigue Patient left: in bed;with call bell/phone within  reach;with bed alarm set Nurse Communication: Mobility status PT Visit Diagnosis: Unsteadiness on feet (R26.81);Muscle weakness (generalized) (M62.81);Dizziness and giddiness (R42)     Time: 6045-4098 PT Time Calculation (min) (ACUTE ONLY): 9 min  Charges:                            Elmon Else, SPT    Aaleigha Bozza 03/09/2023, 4:34 PM

## 2023-03-09 NOTE — Plan of Care (Signed)
  Problem: Education: Goal: Knowledge of condition and prescribed therapy will improve Outcome: Progressing   Problem: Cardiac: Goal: Will achieve and/or maintain adequate cardiac output Outcome: Progressing   Problem: Physical Regulation: Goal: Complications related to the disease process, condition or treatment will be avoided or minimized Outcome: Progressing   Problem: Education: Goal: Knowledge of General Education information will improve Description: Including pain rating scale, medication(s)/side effects and non-pharmacologic comfort measures Outcome: Progressing   Problem: Health Behavior/Discharge Planning: Goal: Ability to manage health-related needs will improve Outcome: Progressing   Problem: Clinical Measurements: Goal: Ability to maintain clinical measurements within normal limits will improve Outcome: Progressing Goal: Will remain free from infection Outcome: Progressing Goal: Diagnostic test results will improve Outcome: Progressing Goal: Respiratory complications will improve Outcome: Progressing Goal: Cardiovascular complication will be avoided Outcome: Progressing   Problem: Nutrition: Goal: Adequate nutrition will be maintained Outcome: Progressing   Problem: Coping: Goal: Level of anxiety will decrease Outcome: Progressing

## 2023-03-09 NOTE — TOC Progression Note (Signed)
Transition of Care [facility]) - Progression Note    Patient Details  Name: [name] MRN: [id number] Date of Birth: [birth year]  [facility]) CM/SW Contact  Margarito Liner, LCSW Phone Number: 03/09/2023, 12:28 PM  Clinical Narrative:   Per chart review, patient does not have a HCPOA, legal guardian, or any family. Discussed with TOC supervisor. CSW made APS report to [facility].  Expected Discharge Plan and Services                                               Social Determinants of Health (SDOH) Interventions SDOH Screenings   Food Insecurity: Patient Unable To Answer (03/05/2023)  Housing: Patient Unable To Answer (03/05/2023)  Transportation Needs: Patient Unable To Answer (03/05/2023)  Utilities: Patient Unable To Answer (03/05/2023)  Depression (PHQ2-9): Low Risk  (12/12/2020)  Tobacco Use: High Risk (03/05/2023)    Readmission Risk Interventions     No data to display

## 2023-03-10 DIAGNOSIS — J449 Chronic obstructive pulmonary disease, unspecified: Secondary | ICD-10-CM | POA: Diagnosis not present

## 2023-03-10 DIAGNOSIS — F200 Paranoid schizophrenia: Secondary | ICD-10-CM | POA: Diagnosis not present

## 2023-03-10 DIAGNOSIS — I10 Essential (primary) hypertension: Secondary | ICD-10-CM | POA: Diagnosis not present

## 2023-03-10 DIAGNOSIS — R55 Syncope and collapse: Secondary | ICD-10-CM | POA: Diagnosis not present

## 2023-03-10 LAB — GLUCOSE, CAPILLARY: Glucose-Capillary: 100 mg/dL — ABNORMAL HIGH (ref 70–99)

## 2023-03-10 NOTE — Progress Notes (Signed)
Physical Therapy Treatment Patient Details Name: [name] MRN: [id number] DOB: 09/03/1950 Today's Date: 03/10/2023   History of Present Illness Patient is a 72 year old male from group home with unwitnessed fall with possible syncopal episode, striking his head. History of hypertension, hyperlipidemia, COPD, bipolar disorder, and schizophrenia. MRI of brain with no acute intracranial pathology    PT Comments  Pt was sitting in recliner upon arrival. He is alert but only oriented x 2. Agrees to session with encouragement. He required mod assist to stand form recliner with initial posterior lean/push. With Vcs and moving RW out in front of him, pt was able to find neutral then progress to ambulation to doorway of room and return. Pt tends to have shuffling step to gait pattern. Poor ability to correct even with TC/vcs. Acute PT will continue to follow and progress as able per current POC.     If plan is discharge home, recommend the following: A little help with walking and/or transfers;A little help with bathing/dressing/bathroom;Assistance with cooking/housework;Supervision due to cognitive status;Help with stairs or ramp for entrance;Assist for transportation     Equipment Recommendations  None recommended by PT       Precautions / Restrictions Precautions Precautions: Fall Restrictions Weight Bearing Restrictions: No     Mobility  Bed Mobility Overal bed mobility: Needs Assistance Bed Mobility: Sit to Supine  Sit to supine: Min assist, HOB elevated General bed mobility comments: Pt was unable to progress BLEs into bed form EOB short sit. required min asisst to achieve    Transfers Overall transfer level: Needs assistance Equipment used: Rolling walker (2 wheels) Transfers: Sit to/from Stand  General transfer comment: Pt required mod assist to stand form lower recliner surface. Posterior push noted however balance improve once standing and RW pushed further out in front of  him    Ambulation/Gait Ambulation/Gait assistance: Min assist, Contact guard assist Gait Distance (Feet): 30 Feet Assistive device: Rolling walker (2 wheels) Gait Pattern/deviations: Step-to pattern, Shuffle, Decreased stride length, Decreased stance time - right, Decreased stance time - left Gait velocity: Decreased  General Gait Details: Pt was able to ambulate ~ 30 ft with slow step to shuffling gait. no LOB but min assist at time for propelling RW and during turns    Balance Overall balance assessment: Needs assistance Sitting-balance support: Feet supported, Bilateral upper extremity supported Sitting balance-Leahy Scale: Fair     Standing balance support: Bilateral upper extremity supported, During functional activity, Reliant on assistive device for balance Standing balance-Leahy Scale: Fair Standing balance comment: did have severe posterior push at first that improved with standing time     Cognition Arousal: Alert Behavior During Therapy: Flat affect Overall Cognitive Status: No family/caregiver present to determine baseline cognitive functioning      General Comments: Pt is alert and oriented x 2. Agrees to session with encouragement               Pertinent Vitals/Pain Pain Assessment Pain Assessment: No/denies pain Breathing: normal     PT Goals (current goals can now be found in the care plan section) Acute Rehab PT Goals Patient Stated Goal: none stated Progress towards PT goals: Progressing toward goals    Frequency    Min 1X/week       AM-PAC PT "6 Clicks" Mobility   Outcome Measure  Help needed turning from your back to your side while in a flat bed without using bedrails?: A Little Help needed moving from lying on your back to  sitting on the side of a flat bed without using bedrails?: A Little Help needed moving to and from a bed to a chair (including a wheelchair)?: A Little Help needed standing up from a chair using your arms (e.g.,  wheelchair or bedside chair)?: A Little Help needed to walk in hospital room?: A Little Help needed climbing 3-5 steps with a railing? : A Lot 6 Click Score: 17    End of Session   Activity Tolerance: Patient tolerated treatment well Patient left: in bed;with call bell/phone within reach;with bed alarm set Nurse Communication: Mobility status PT Visit Diagnosis: Unsteadiness on feet (R26.81);Muscle weakness (generalized) (M62.81);Dizziness and giddiness (R42)     Time: 1340-1402 PT Time Calculation (min) (ACUTE ONLY): 22 min  Charges:    $Gait Training: 8-22 mins PT General Charges $$ ACUTE PT VISIT: 1 Visit                    Jetta Lout PTA 03/10/23, 2:42 PM

## 2023-03-10 NOTE — Progress Notes (Signed)
  Progress Note   Patient: [name] [id number]:[id number] DOB: 1950/11/02 DOA: 03/04/2023     0 DOS: the patient was seen and examined on 03/10/2023   Brief hospital course: [name] is a 72 year old male with past medical history significant for hypertension, hyperlipidemia, bipolar disorder, COPD, schizophrenia, presented to emergency department for evaluation of fall.  Patient sustained laceration that was repaired in the ED.  Patient is admitted to hospitalist service for evaluation of syncope/dizziness.  Patient had imaging studies including CT head, spine did not reveal any acute process.  Patient's workup so far negative.  He is awaiting group home placement.  Assessment and Plan: * Syncope and collapse Component of orthostatic positive vitals. He did receive IV hydration. Symptoms much better. He does have unsteady gait. PT OT follow-up. TOC working with APS, need group home placement.  Hypothyroidism Continue levothyroxine 25 mcg and PCP to manage up titration of dose.   Unsteady gait Fall precaution. PT/ OT on board recommended rehab. Orthostatic vitals every shift.  Schizophrenia (HCC) Continue Celexa Prolixin Risperdal and trazodone.  COPD (chronic obstructive pulmonary disease) (HCC) No exacerbation. PRN albuterol. Oxygen via nasal cannula as needed.   Hyperlipidemia Continue Zocor  GERD (gastroesophageal reflux disease) Continue PPI. Aspiration precaution.   Essential hypertension Continue amlodipine 10 and lisinopril 5 mg.   Nursing supportive care. DVT prophy subcu heparin. CODE STATUS full code.       Subjective: Patient is seen and examined today morning.  He is lying comfortably. Denies any complaints. Eating poor. Did not get out of bed.  Physical Exam: Vitals:   03/10/23 0444 03/10/23 0500 03/10/23 0755 03/10/23 1610  BP: 126/69  120/63 109/60  Pulse: 73  72 (!) 109  Resp: 19  18 19   Temp: 99.1 F (37.3 C)  98.4 F (36.9 C) 99.7 F  (37.6 C)  TempSrc: Oral     SpO2: 96%  99% 93%  Weight:  84 kg    Height:       General - Elderly African-American male, no apparent distress HEENT - PERRLA, EOMI, atraumatic head, non tender sinuses. Lung - Clear, rales, rhonchi, wheezes. Heart - S1, S2 heard, no murmurs, rubs, no pedal edema Neuro - Sleeping, arousable, non focal exam. Skin - Warm and dry. Data Reviewed:  There are no new results to review at this time.  Family Communication: patient does not have a HCPOA, legal guardian, or any family. TOC to work with APS  Disposition: Status is: Observation The patient remains OBS appropriate and will d/c before 2 midnights.  Planned Discharge Destination: Rehab APS involved.    Time spent: 38 minutes  Author: Marcelino Duster, MD 03/10/2023 5:35 PM  For on call review [email or web address].

## 2023-03-10 NOTE — Progress Notes (Addendum)
Mobility Specialist - Progress Note   03/10/23 1200  Mobility  Activity Ambulated with assistance in room;Transferred from bed to chair  Level of Assistance Minimal assist, patient does 75% or more  Assistive Device Front wheel walker  Distance Ambulated (ft) 30 ft  Activity Response Tolerated well  $Mobility charge 1 Mobility     Pt lying in bed upon arrival, utilizing RA. Pt agreeable to activity. Completed bed mobility with modA. Significant post lean in sitting, requiring persistent assist to maintain balance. STS with minA. +2 in throughout session for safety. Pt ambulated just outside of room with minA. Mild post lean during ambulation that does improve with distance. Shuffled gait; VC for increasing step length with ability to follow through before returning to shuffled gait. RW negotiation. Cues for staying close/inside RW. urine emptied. Pt returned to chair with alarm set, needs in reach. RN notified.     Filiberto Pinks Mobility Specialist 03/10/23, 12:27 PM

## 2023-03-11 DIAGNOSIS — R55 Syncope and collapse: Secondary | ICD-10-CM | POA: Diagnosis not present

## 2023-03-11 DIAGNOSIS — J449 Chronic obstructive pulmonary disease, unspecified: Secondary | ICD-10-CM | POA: Diagnosis not present

## 2023-03-11 DIAGNOSIS — K219 Gastro-esophageal reflux disease without esophagitis: Secondary | ICD-10-CM | POA: Diagnosis not present

## 2023-03-11 DIAGNOSIS — I10 Essential (primary) hypertension: Secondary | ICD-10-CM | POA: Diagnosis not present

## 2023-03-11 LAB — GLUCOSE, CAPILLARY: Glucose-Capillary: 94 mg/dL (ref 70–99)

## 2023-03-11 NOTE — Plan of Care (Signed)

## 2023-03-11 NOTE — Progress Notes (Signed)
  Progress Note   Patient: [name] MVH:[id number] DOB: 1950/10/05 DOA: 03/04/2023     0 DOS: the patient was seen and examined on 03/11/2023   Brief hospital course: [name] is a 72 year old male with past medical history significant for hypertension, hyperlipidemia, bipolar disorder, COPD, schizophrenia, presented to emergency department for evaluation of fall.  Patient sustained laceration that was repaired in the ED.  Patient is admitted to hospitalist service for evaluation of syncope/dizziness.  Patient had imaging studies including CT head, spine did not reveal any acute process.  Patient's workup so far negative.  Baseline cognitive deficits, he need supervision, high risk for fall, not safe to go home. TOC working with APS for [employer], legal guardian.  Assessment and Plan: * Syncope and collapse Component of orthostatic positive vitals. He did receive IV hydration. Symptoms much better. He does have unsteady gait. PT OT follow-up. TOC working with APS, need legal guardian, placement.  Hypothyroidism Continue levothyroxine 25 mcg and PCP to manage up titration of dose.   Unsteady gait Fall precautions. PT/ OT on board recommended rehab/ DME.  Schizophrenia (HCC) Continue Celexa Prolixin Risperdal and trazodone.  COPD (chronic obstructive pulmonary disease) (HCC) No exacerbation. PRN albuterol. Oxygen via nasal cannula as needed.   Hyperlipidemia Continue Zocor  GERD (gastroesophageal reflux disease) Continue PPI. Aspiration precaution.   Essential hypertension Continue amlodipine 10 and lisinopril 5 mg.   Nursing supportive care. DVT prophy subcu heparin. CODE STATUS full code.       Subjective: Patient is seen and examined today morning.  He is lying comfortably. Refuses to get out of bed. Did not eat breakfast. Poor historian at baseline.  Physical Exam: Vitals:   03/11/23 0509 03/11/23 0906 03/11/23 1157 03/11/23 1543  BP: 120/64 117/66 109/67  131/61  Pulse: 80 75 84 80  Resp: 19 16 17 16   Temp: 99.1 F (37.3 C) 98.1 F (36.7 C) 98.3 F (36.8 C) 99.9 F (37.7 C)  TempSrc: Oral   Oral  SpO2: 95% 97% 97% 98%  Weight:      Height:       General - Elderly African-American male, no apparent distress HEENT - PERRLA, EOMI, atraumatic head, non tender sinuses. Lung - Clear, rales, rhonchi, wheezes. Heart - S1, S2 heard, no murmurs, rubs, no pedal edema Neuro - alert, awake, non focal exam. Skin - Warm and dry. Data Reviewed:  There are no new results to review at this time.  Family Communication: patient does not have a HCPOA, legal guardian, or any family. TOC to work with APS  Disposition: Status is: Observation The patient remains OBS appropriate and will d/c before 2 midnights.  Planned Discharge Destination: Rehab APS involved.    Time spent: 35 minutes  Author: Marcelino Duster, MD 03/11/2023 5:01 PM  For on call review [email or web address].

## 2023-03-11 NOTE — Plan of Care (Signed)
  Problem: Education: Goal: Knowledge of condition and prescribed therapy will improve Outcome: Progressing   Problem: Physical Regulation: Goal: Complications related to the disease process, condition or treatment will be avoided or minimized Outcome: Progressing   Problem: Education: Goal: Knowledge of General Education information will improve Description: Including pain rating scale, medication(s)/side effects and non-pharmacologic comfort measures Outcome: Progressing   Problem: Health Behavior/Discharge Planning: Goal: Ability to manage health-related needs will improve Outcome: Progressing   Problem: Coping: Goal: Level of anxiety will decrease Outcome: Progressing   Problem: Elimination: Goal: Will not experience complications related to urinary retention Outcome: Progressing   Problem: Pain Managment: Goal: General experience of comfort will improve Outcome: Progressing   Problem: Safety: Goal: Ability to remain free from injury will improve Outcome: Progressing   Problem: Skin Integrity: Goal: Risk for impaired skin integrity will decrease Outcome: Progressing

## 2023-03-11 NOTE — Progress Notes (Signed)
Mobility Specialist - Progress Note   03/11/23 1100  Mobility  Activity Refused mobility     Pt declined mobility; reports he had just returned to bed. Will attempt another date/time.    Filiberto Pinks Mobility Specialist 03/11/23, 11:54 AM

## 2023-03-11 NOTE — Progress Notes (Signed)
Physical Therapy Treatment Patient Details Name: [name] MRN: [id number] DOB: 1951-04-06 Today's Date: 03/11/2023   History of Present Illness Patient is a 72 year old male from group home with unwitnessed fall with possible syncopal episode, striking his head. History of hypertension, hyperlipidemia, COPD, bipolar disorder, and schizophrenia. MRI of brain with no acute intracranial pathology    PT Comments  Pt was long sitting in bed watching TV upon arrival. He is alert but presents with slow processing and increased time to respond. Overall flat affect. He required encouragement to participate but once agreeable, did fully participate. Pt was cued to progress to sitting EOB. Quickly and easily able to progress BLE to EOB but pt struggles to slide hips forward and required mod assist to achieve short sitting with feet to touch floor. Pt has poor motor planning at times. He was able to stand and tolerate increased gait today but required min assist to continue progressing RW fwd. PT will continue to progress pt per POC to maximize independence while decreasing caregiver burden.    If plan is discharge home, recommend the following: A little help with walking and/or transfers;A little help with bathing/dressing/bathroom;Assistance with cooking/housework;Supervision due to cognitive status;Help with stairs or ramp for entrance;Assist for transportation     Equipment Recommendations  Other (comment);Rolling walker (2 wheels);BSC/3in1;Wheelchair (measurements PT);Wheelchair cushion (measurements PT)       Precautions / Restrictions Precautions Precautions: Fall Restrictions Weight Bearing Restrictions: No     Mobility  Bed Mobility Overal bed mobility: Needs Assistance Bed Mobility: Supine to Sit, Sit to Supine  Supine to sit: Supervision, HOB elevated, Mod assist Sit to supine: Min assist, HOB elevated   Transfers Overall transfer level: Needs assistance Equipment used: Rolling  walker (2 wheels) Transfers: Sit to/from Stand Sit to Stand: Min assist, Mod assist  General transfer comment: pt stood from lowest bed height but required vcs for safety and improved technique.    Ambulation/Gait Ambulation/Gait assistance: Min assist Gait Distance (Feet): 80 Feet Assistive device: Rolling walker (2 wheels) Gait Pattern/deviations: Step-to pattern, Shuffle, Decreased stride length, Decreased stance time - right, Decreased stance time - left, Trunk flexed Gait velocity: Decreased  General Gait Details: pt was able to ambulate to RN station and return. Author had to progress RW to allow pt to ambulate with smooth gait kinematics. If author did nto progress RW, pt tends to be more unsteady and have freezing then shuffling/poor step quality    Balance Overall balance assessment: Needs assistance Sitting-balance support: Feet supported, Bilateral upper extremity supported Sitting balance-Leahy Scale: Fair     Standing balance support: Bilateral upper extremity supported, During functional activity, Reliant on assistive device for balance Standing balance-Leahy Scale: Poor Standing balance comment: reliant on RW       Cognition Arousal: Alert Behavior During Therapy: Flat affect Overall Cognitive Status: No family/caregiver present to determine baseline cognitive functioning      General Comments: Pt is alert but has slow processing. needs encouragement to participate in session and to talk during session.               Pertinent Vitals/Pain Pain Assessment Pain Assessment: No/denies pain Breathing: normal     PT Goals (current goals can now be found in the care plan section) Acute Rehab PT Goals Patient Stated Goal: none stated Progress towards PT goals: Progressing toward goals    Frequency    Min 1X/week       AM-PAC PT "6 Clicks" Mobility   Outcome  Measure  Help needed turning from your back to your side while in a flat bed without using  bedrails?: A Little Help needed moving from lying on your back to sitting on the side of a flat bed without using bedrails?: A Lot Help needed moving to and from a bed to a chair (including a wheelchair)?: A Little Help needed standing up from a chair using your arms (e.g., wheelchair or bedside chair)?: A Little Help needed to walk in hospital room?: A Lot Help needed climbing 3-5 steps with a railing? : A Lot 6 Click Score: 15    End of Session   Activity Tolerance: Patient tolerated treatment well Patient left: in bed;with call bell/phone within reach;with bed alarm set Nurse Communication: Mobility status PT Visit Diagnosis: Unsteadiness on feet (R26.81);Muscle weakness (generalized) (M62.81);Dizziness and giddiness (R42)     Time: 4098-1191 PT Time Calculation (min) (ACUTE ONLY): 16 min  Charges:    $Gait Training: 8-22 mins PT General Charges $$ ACUTE PT VISIT: 1 Visit                    Jetta Lout PTA 03/11/23, 3:40 PM

## 2023-03-12 DIAGNOSIS — R55 Syncope and collapse: Secondary | ICD-10-CM | POA: Diagnosis not present

## 2023-03-12 LAB — GLUCOSE, CAPILLARY: Glucose-Capillary: 94 mg/dL (ref 70–99)

## 2023-03-12 MED ORDER — LEVOTHYROXINE SODIUM 50 MCG PO TABS
75.0000 ug | ORAL_TABLET | Freq: Every day | ORAL | Status: DC
  Administered 2023-03-13 – 2023-03-14 (×2): 75 ug via ORAL
  Filled 2023-03-12 (×2): qty 1

## 2023-03-12 MED ORDER — ENOXAPARIN SODIUM 40 MG/0.4ML IJ SOSY
40.0000 mg | PREFILLED_SYRINGE | Freq: Every evening | INTRAMUSCULAR | Status: DC
  Administered 2023-03-12 – 2023-04-09 (×29): 40 mg via SUBCUTANEOUS
  Filled 2023-03-12 (×29): qty 0.4

## 2023-03-12 NOTE — TOC Progression Note (Signed)
Transition of Care [facility]) - Progression Note    Patient Details  Name: Nicoles Montesino MRN: [id number] Date of Birth: 20-Oct-1950  Transition of Care [facility] CM/SW Contact  Garret Reddish, RN Phone Number: 03/12/2023, 1:20 PM  Clinical Narrative:     Chart reviewed.  I have spoken with [name] with Group home.  He informs me that Mr. Deignan can return once he is able to be more independent.  Mrs. Schey informs me that he has been with the Group home for a while and he was making his medical decisions for him.  He is not aware of any family that Mr. Desnoyers has.  I informed Mr. Dority that PT is working with patient while he has been in the hospital.  I have left a voice message for Mrs. Lakeeta Sifforb with DSS to discuss the situation.    TOC will continue to follow for discharge planning.        Expected Discharge Plan and Services                                               Social Determinants of Health (SDOH) Interventions SDOH Screenings   Food Insecurity: Patient Unable To Answer (03/05/2023)  Housing: Patient Unable To Answer (03/05/2023)  Transportation Needs: Patient Unable To Answer (03/05/2023)  Utilities: Patient Unable To Answer (03/05/2023)  Depression (PHQ2-9): Low Risk  (12/12/2020)  Tobacco Use: High Risk (03/05/2023)    Readmission Risk Interventions     No data to display

## 2023-03-12 NOTE — Progress Notes (Signed)
[facility] Progress Note  Patient: [name]    WJX:[id number]  DOA: 03/04/2023     Date of Service: the patient was seen and examined on 03/12/2023  Chief Complaint  Patient presents with   Fall   Head Injury   Brief hospital course: [name] is a 72 year old male with past medical history significant for hypertension, hyperlipidemia, bipolar disorder, COPD, schizophrenia, presented to emergency department for evaluation of fall.  Patient sustained laceration that was repaired in the ED.  Patient is admitted to hospitalist service for evaluation of syncope/dizziness.  Patient had imaging studies including CT head, spine did not reveal any acute process.  Patient's workup so far negative.  Baseline cognitive deficits, he need supervision, high risk for fall, not safe to go home. TOC working with APS for [employer], legal guardian.   Assessment and Plan: Syncope and collapse Component of orthostatic positive vitals. He did receive IV hydration. Symptoms much better. He does have unsteady gait. PT OT follow-up. TOC working with APS, need legal guardian, placement.   Hypothyroidism New diagnosis, patient was started on Synthroid 25 mcg, increased to 75 mcg on 03/12/2023.  Repeat TSH level after 4 weeks and titrate dose of Synthroid accordingly.    Unsteady gait Fall precautions. PT/ OT on board recommended rehab/ DME.   Schizophrenia (HCC) Continue Celexa Prolixin Risperdal and trazodone.   COPD (chronic obstructive pulmonary disease) (HCC) No exacerbation. PRN albuterol. Oxygen via nasal cannula as needed.    Hyperlipidemia Continue Zocor   GERD (gastroesophageal reflux disease) Continue PPI. Aspiration precaution.    Essential hypertension Continue amlodipine 10 and lisinopril 5 mg.    Body mass index is 26.79 kg/m.  Interventions:  Diet: Regular diet DVT Prophylaxis: Subcutaneous Lovenox   Advance goals of care discussion: Full code  Family  Communication: patient does not have a HCPOA, legal guardian, or any family. TOC to work with APS    Disposition:  Status is: Observation The patient remains OBS appropriate and will d/c before 2 midnights.  Planned Discharge Destination: Rehab APS involved.   Subjective: No significant events overnight, patient was resting in bed, denied any complaints.  Patient is AO x 1 only knows his name, does not even know his date of birth.   Physical Exam: General: NAD, lying comfortably Appear in no distress, affect appropriate Eyes: PERRLA ENT: Oral Mucosa Clear, moist  Neck: no JVD,  Cardiovascular: S1 and S2 Present, no Murmur,  Respiratory: good respiratory effort, Bilateral Air entry equal and Decreased, no Crackles, no wheezes Abdomen: Bowel Sound present, Soft and no tenderness,  Skin: no rashes Extremities: no Pedal edema, no calf tenderness Neurologic: without any new focal findings Gait not checked due to patient safety concerns  Vitals:   03/11/23 2043 03/12/23 0124 03/12/23 0533 03/12/23 0809  BP: 132/78  121/72 117/76  Pulse: (!) 106  93 95  Resp: 16   18  Temp: 99.9 F (37.7 C)  99.7 F (37.6 C) 99.4 F (37.4 C)  TempSrc: Oral  Oral Oral  SpO2: 97%  94% 97%  Weight:  82.3 kg    Height:       No intake or output data in the 24 hours ending 03/12/23 1440 Filed Weights   03/10/23 0500 03/11/23 0321 03/12/23 0124  Weight: 84 kg 84.2 kg 82.3 kg    Data Reviewed: I have personally reviewed and interpreted daily labs, tele strips, imagings as discussed above. I reviewed all nursing notes, pharmacy notes, vitals, pertinent old records  I have discussed plan of care as described above with RN and patient/family.  CBC: Recent Labs  Lab 03/06/23 0456  WBC 8.0  HGB 12.1*  HCT 36.1*  MCV 89.1  PLT 112*   Basic Metabolic Panel: Recent Labs  Lab 03/06/23 0456  NA 137  K 3.6  CL 106  CO2 24  GLUCOSE 76  BUN 11  CREATININE 0.86  CALCIUM 9.4     Studies: No results found.  Scheduled Meds:  amLODipine  10 mg Oral Daily   citalopram  40 mg Oral Daily   docusate sodium  100 mg Oral BID   fluPHENAZine  5 mg Oral Daily   heparin  5,000 Units Subcutaneous Q12H   [START ON 03/13/2023] levothyroxine  75 mcg Oral Q0600   lisinopril  5 mg Oral Daily   risperiDONE  1 mg Oral Daily   sodium chloride flush  3 mL Intravenous Q12H   Continuous Infusions: PRN Meds: acetaminophen **OR** acetaminophen, bisacodyl, hydrALAZINE, ondansetron **OR** ondansetron (ZOFRAN) IV, polyethylene glycol, sodium phosphate  Time spent: 35 minutes  Author: Gillis Reid. MD [facility] 03/12/2023 2:40 PM  To reach On-call, see care teams to locate the attending and reach out to them via [email or web address]. If 7PM-7AM, please contact night-coverage If you still have difficulty reaching the attending provider, please page the [facility] (Director on Call) for [facility] on amion for assistance.

## 2023-03-12 NOTE — Plan of Care (Signed)

## 2023-03-12 NOTE — Progress Notes (Signed)
PT Cancellation Note  Patient Details Name: [name] MRN: [id number] DOB: 1951/01/28   Cancelled Treatment:     PT attempt. Pt was awake upon arrival but unwilling to talk to author or to participate at this time. Pt closes his eyes and acts like he drifted to sleep. Acute PT will continue to follow and progress as able per current POC.    Rushie Chestnut 03/12/2023, 9:59 AM

## 2023-03-13 DIAGNOSIS — R2681 Unsteadiness on feet: Secondary | ICD-10-CM | POA: Diagnosis not present

## 2023-03-13 DIAGNOSIS — R55 Syncope and collapse: Secondary | ICD-10-CM | POA: Diagnosis not present

## 2023-03-13 DIAGNOSIS — I10 Essential (primary) hypertension: Secondary | ICD-10-CM | POA: Diagnosis not present

## 2023-03-13 DIAGNOSIS — J449 Chronic obstructive pulmonary disease, unspecified: Secondary | ICD-10-CM | POA: Diagnosis not present

## 2023-03-13 LAB — GLUCOSE, CAPILLARY
Glucose-Capillary: 108 mg/dL — ABNORMAL HIGH (ref 70–99)
Glucose-Capillary: 112 mg/dL — ABNORMAL HIGH (ref 70–99)
Glucose-Capillary: 89 mg/dL (ref 70–99)

## 2023-03-13 NOTE — Progress Notes (Signed)
Checked with Unk Pinto with [facility] about follow up mental health care. Unable to accept patient's insurance. Suggested [facility].

## 2023-03-13 NOTE — Progress Notes (Signed)
PROGRESS NOTE  [name] [id number] DOB: [birth year]   PCP: Koren Bound, NP  Patient is from: Group home  DOA: 03/04/2023 LOS: 0  Chief complaints Chief Complaint  Patient presents with   Fall   Head Injury     Brief Narrative / Interim history: 72 year old male with past medical history significant for hypertension, hyperlipidemia, bipolar disorder, COPD, schizophrenia and cognitive deficit, presented to emergency department for evaluation of fall. Patient sustained laceration that was repaired in the ED. Patient is admitted to hospitalist service for evaluation of syncope/dizziness. Patient had imaging studies including CT head, spine did not reveal any acute process. Patient's workup so far negative. Baseline cognitive deficits, he need supervision, high risk for fall, not safe to go home. TOC working with APS for [employer], legal guardian.     Subjective: Seen and examined earlier this morning.  Has had mild fever to 100.5 last night.  No complaints but not a great historian.  He responds no to pain, respiratory, GI or UTI symptoms.  He is awake and alert and oriented to self, person and date but not month.  Objective: Vitals:   03/12/23 2023 03/13/23 0200 03/13/23 0505 03/13/23 0807  BP: 125/63  130/64 133/67  Pulse: 97  97 (!) 102  Resp: 16  16 16   Temp: (!) 100.5 F (38.1 C)  100.3 F (37.9 C) 98.9 F (37.2 C)  TempSrc: Oral  Oral   SpO2: 98%  100% 96%  Weight:  80.9 kg    Height:        Examination:  GENERAL: No apparent distress.  Nontoxic. HEENT: MMM.  Vision and hearing grossly intact.  NECK: Supple.  No apparent JVD.  RESP:  No IWOB.  Fair aeration bilaterally. CVS:  RRR.  2/6 SEM all over. ABD/GI/GU: BS+. Abd soft, NTND.  MSK/EXT:  Moves extremities. No apparent deformity. No edema.  SKIN: no apparent skin lesion or wound NEURO: Awake, alert and oriented to self, place and date but not month and year.  Follows commands.  No apparent focal neuro  deficit. PSYCH: Calm. Normal affect.   Procedures:  None  Microbiology summarized: None  Assessment and plan: Principal Problem:   Syncope and collapse Active Problems:   Essential hypertension   GERD (gastroesophageal reflux disease)   Hyperlipidemia   COPD (chronic obstructive pulmonary disease) (HCC)   Schizophrenia (HCC)   Unsteady gait   Hypothyroidism  Syncope and collapse: Positive for orthostatic vitals.  New diagnosis of hypothyroidism.  TTE without significant finding.  Has unsteady gait.  He is from group home.  Received IV fluid hydration. TOC working with APS, need legal guardian, placement. -Continue Synthroid as below -Continue fall precaution   Hypothyroidism: TSH elevated to 34.  Initially started on Synthroid 25 mcg daily which was later increased to 75 mcg daily on 8/23 -Continue Synthroid 75 mcg daily -Repeat TSH in 4 to 6 weeks    Unsteady gait -fall precautions. -PT/ OT on board recommended rehab/ DME.   Schizophrenia (HCC): Stable -Continue Celexa Prolixin Risperdal and trazodone.   COPD: Stable -Bronchodilators as needed   Hyperlipidemia -Continue Zocor   GERD -Continue PPI.   Essential hypertension -Continue amlodipine 10 and lisinopril 5 mg.   Fever: Mild fever to 100.5 the evening of 8/23. No clear source of infection but patient is not a great historian.. -Check basic labs in the morning -May initiate infectious workup if he continues to spike fever   Body mass index is 26.34 kg/m.  DVT prophylaxis:  enoxaparin (LOVENOX) injection 40 mg Start: 03/12/23 1800 TED hose Start: 03/04/23 1914  Code Status: Full code Family Communication: None at bedside Level of care: Telemetry Medical Status is: Observation The patient remains OBS appropriate and will d/c before 2 midnights.   Final disposition:  Consultants:  None  35 minutes with more than 50% spent in reviewing records, counseling patient/family and  coordinating care.   Sch Meds:  Scheduled Meds:  amLODipine  10 mg Oral Daily   citalopram  40 mg Oral Daily   docusate sodium  100 mg Oral BID   enoxaparin (LOVENOX) injection  40 mg Subcutaneous QPM   fluPHENAZine  5 mg Oral Daily   levothyroxine  75 mcg Oral Q0600   lisinopril  5 mg Oral Daily   risperiDONE  1 mg Oral Daily   Continuous Infusions: PRN Meds:.acetaminophen **OR** acetaminophen, bisacodyl, hydrALAZINE, ondansetron **OR** ondansetron (ZOFRAN) IV, polyethylene glycol, sodium phosphate  Antimicrobials: Anti-infectives (From admission, onward)    None        I have personally reviewed the following labs and images: CBC: No results for input(s): "WBC", "NEUTROABS", "HGB", "HCT", "MCV", "PLT" in the last 168 hours. BMP &GFR No results for input(s): "NA", "K", "CL", "CO2", "GLUCOSE", "BUN", "CREATININE", "CALCIUM", "MG", "PHOS" in the last 168 hours.  Invalid input(s): "GFRCG" Estimated Creatinine Clearance: 77.6 mL/min (by C-G formula based on SCr of 0.86 mg/dL). Liver & Pancreas: No results for input(s): "AST", "ALT", "ALKPHOS", "BILITOT", "PROT", "ALBUMIN" in the last 168 hours. No results for input(s): "LIPASE", "AMYLASE" in the last 168 hours. No results for input(s): "AMMONIA" in the last 168 hours. Diabetic: No results for input(s): "HGBA1C" in the last 72 hours. Recent Labs  Lab 03/10/23 0445 03/11/23 0511 03/12/23 0535 03/13/23 0505 03/13/23 0808  GLUCAP 100* 94 94 108* 112*   Cardiac Enzymes: No results for input(s): "CKTOTAL", "CKMB", "CKMBINDEX", "TROPONINI" in the last 168 hours. No results for input(s): "PROBNP" in the last 8760 hours. Coagulation Profile: No results for input(s): "INR", "PROTIME" in the last 168 hours. Thyroid Function Tests: No results for input(s): "TSH", "T4TOTAL", "FREET4", "T3FREE", "THYROIDAB" in the last 72 hours. Lipid Profile: No results for input(s): "CHOL", "HDL", "LDLCALC", "TRIG", "CHOLHDL", "LDLDIRECT" in  the last 72 hours. Anemia Panel: No results for input(s): "VITAMINB12", "FOLATE", "FERRITIN", "TIBC", "IRON", "RETICCTPCT" in the last 72 hours. Urine analysis:    Component Value Date/Time   COLORURINE YELLOW (A) 03/04/2023 1419   APPEARANCEUR HAZY (A) 03/04/2023 1419   APPEARANCEUR Clear 01/03/2013 0232   LABSPEC 1.024 03/04/2023 1419   LABSPEC 1.006 01/03/2013 0232   PHURINE 5.0 03/04/2023 1419   GLUCOSEU NEGATIVE 03/04/2023 1419   GLUCOSEU Negative 01/03/2013 0232   HGBUR NEGATIVE 03/04/2023 1419   BILIRUBINUR NEGATIVE 03/04/2023 1419   BILIRUBINUR Negative 01/03/2013 0232   KETONESUR NEGATIVE 03/04/2023 1419   PROTEINUR 30 (A) 03/04/2023 1419   NITRITE NEGATIVE 03/04/2023 1419   LEUKOCYTESUR NEGATIVE 03/04/2023 1419   LEUKOCYTESUR Negative 01/03/2013 0232   Sepsis Labs: Invalid input(s): "PROCALCITONIN", "LACTICIDVEN"  Microbiology: No results found for this or any previous visit (from the past 240 hour(s)).  Radiology Studies: No results found.    Lusine Corlett T. Belle Charlie [facility]  If 7PM-7AM, please contact night-coverage [email or web address] 03/13/2023, 11:56 AM

## 2023-03-14 DIAGNOSIS — R55 Syncope and collapse: Secondary | ICD-10-CM | POA: Diagnosis not present

## 2023-03-14 LAB — CBC WITH DIFFERENTIAL/PLATELET
Abs Immature Granulocytes: 0.03 10*3/uL (ref 0.00–0.07)
Basophils Absolute: 0 10*3/uL (ref 0.0–0.1)
Basophils Relative: 0 %
Eosinophils Absolute: 0.3 10*3/uL (ref 0.0–0.5)
Eosinophils Relative: 4 %
HCT: 40.2 % (ref 39.0–52.0)
Hemoglobin: 13.3 g/dL (ref 13.0–17.0)
Immature Granulocytes: 0 %
Lymphocytes Relative: 14 %
Lymphs Abs: 1 10*3/uL (ref 0.7–4.0)
MCH: 29.9 pg (ref 26.0–34.0)
MCHC: 33.1 g/dL (ref 30.0–36.0)
MCV: 90.3 fL (ref 80.0–100.0)
Monocytes Absolute: 0.8 10*3/uL (ref 0.1–1.0)
Monocytes Relative: 11 %
Neutro Abs: 5.2 10*3/uL (ref 1.7–7.7)
Neutrophils Relative %: 71 %
Platelets: 143 10*3/uL — ABNORMAL LOW (ref 150–400)
RBC: 4.45 MIL/uL (ref 4.22–5.81)
RDW: 13.2 % (ref 11.5–15.5)
WBC: 7.3 10*3/uL (ref 4.0–10.5)
nRBC: 0 % (ref 0.0–0.2)

## 2023-03-14 LAB — RENAL FUNCTION PANEL
Albumin: 3.2 g/dL — ABNORMAL LOW (ref 3.5–5.0)
Anion gap: 9 (ref 5–15)
BUN: 23 mg/dL (ref 8–23)
CO2: 22 mmol/L (ref 22–32)
Calcium: 9.2 mg/dL (ref 8.9–10.3)
Chloride: 99 mmol/L (ref 98–111)
Creatinine, Ser: 0.96 mg/dL (ref 0.61–1.24)
GFR, Estimated: >60 mL/min (ref >60)
Glucose, Bld: 140 mg/dL — ABNORMAL HIGH (ref 70–99)
Phosphorus: 4 mg/dL (ref 2.5–4.6)
Potassium: 4.2 mmol/L (ref 3.5–5.1)
Sodium: 130 mmol/L — ABNORMAL LOW (ref 135–145)

## 2023-03-14 LAB — MAGNESIUM: Magnesium: 2.4 mg/dL (ref 1.7–2.4)

## 2023-03-14 LAB — SARS CORONAVIRUS 2 BY RT PCR: SARS Coronavirus 2 by RT PCR: NEGATIVE

## 2023-03-14 LAB — GLUCOSE, CAPILLARY: Glucose-Capillary: 159 mg/dL — ABNORMAL HIGH (ref 70–99)

## 2023-03-14 MED ORDER — SIMVASTATIN 20 MG PO TABS
20.0000 mg | ORAL_TABLET | Freq: Every day | ORAL | Status: DC
  Administered 2023-03-15 – 2023-04-09 (×26): 20 mg via ORAL
  Filled 2023-03-14 (×26): qty 1

## 2023-03-14 NOTE — Plan of Care (Signed)
  Problem: Education: Goal: Knowledge of condition and prescribed therapy will improve Outcome: Not Progressing   Problem: Cardiac: Goal: Will achieve and/or maintain adequate cardiac output Outcome: Not Progressing   Problem: Physical Regulation: Goal: Complications related to the disease process, condition or treatment will be avoided or minimized Outcome: Not Progressing   Problem: Education: Goal: Knowledge of General Education information will improve Description: Including pain rating scale, medication(s)/side effects and non-pharmacologic comfort measures Outcome: Not Progressing   Problem: Health Behavior/Discharge Planning: Goal: Ability to manage health-related needs will improve Outcome: Not Progressing   Problem: Clinical Measurements: Goal: Ability to maintain clinical measurements within normal limits will improve Outcome: Not Progressing Goal: Will remain free from infection Outcome: Not Progressing Goal: Diagnostic test results will improve Outcome: Not Progressing Goal: Respiratory complications will improve Outcome: Not Progressing Goal: Cardiovascular complication will be avoided Outcome: Not Progressing   Problem: Activity: Goal: Risk for activity intolerance will decrease Outcome: Not Progressing   Problem: Nutrition: Goal: Adequate nutrition will be maintained Outcome: Not Progressing   Problem: Coping: Goal: Level of anxiety will decrease Outcome: Not Progressing   Problem: Elimination: Goal: Will not experience complications related to bowel motility Outcome: Not Progressing Goal: Will not experience complications related to urinary retention Outcome: Not Progressing   Problem: Pain Managment: Goal: General experience of comfort will improve Outcome: Not Progressing   Problem: Safety: Goal: Ability to remain free from injury will improve Outcome: Not Progressing   Problem: Skin Integrity: Goal: Risk for impaired skin integrity will  decrease Outcome: Not Progressing   

## 2023-03-14 NOTE — Progress Notes (Signed)
  PROGRESS NOTE    [name]  ZOX:[id number] DOB: 01/30/1951 DOA: 03/04/2023 PCP: Koren Bound, NP  [id number]  LOS: 0 days   Brief hospital course:   Assessment & Plan: 72 year old male with past medical history significant for hypertension, bipolar disorder, COPD, schizophrenia and cognitive deficit, presented from group home to emergency department for evaluation of fall.   Patient sustained laceration that was repaired in the ED. Patient is admitted to hospitalist service for evaluation of syncope/dizziness. Patient had imaging studies including CT head, spine did not reveal any acute process.  Baseline cognitive deficits, he need supervision, high risk for fall, not safe to go home. TOC working with APS for [employer], legal guardian.   Syncope and collapse:  Unsteady gait and fall Positive for orthostatic vitals.  TTE without significant finding.  Has unsteady gait.  Received IV fluid hydration. TOC working with APS, need legal guardian, placement. -Continue fall precaution --repeat orthostatic BP when able --PT/OT rec SNF   Elevated TSH:  TSH elevated to 34, however, free T4 is wnl.  Pt has methimazole on PTA home med list. --Initially started on Synthroid 25 mcg daily which was later increased to 75 mcg daily on 8/23.   Plan: --if pt was taking methimazole for hyperthyroidism PTA, then instead of starting Synthroid for elevated TSH, can just hold methimazole for now. --d/c Synthroid today -Repeat TSH and free T4 in 4 to 6 weeks   Schizophrenia (HCC): Stable -Continue Celexa Prolixin Risperdal    COPD: Stable   Hyperlipidemia -cont home Zocor    Essential hypertension -hold home amlodipine and Lisinopril for now since BP is wnl to soft, and pt presented with orthostasis.   Fever:  Mild fever to 100.5 the evening of 8/23. No clear source of infection but patient is not a great historian.  No leukocytosis.  COVID neg. --obtain blood cx  Hyponatremia --may be due  to poor oral hydration. --nursing to encourage and help oral intake --monitor Na   DVT prophylaxis: Lovenox SQ Code Status: Full code  Family Communication:  Level of care: Telemetry Medical Dispo:   The patient is from: group home Anticipated d/c is to: SNF Anticipated d/c date is: whenever safe disposition or bed available   Subjective and Interval History:  Pt noted to be lethargic.     Objective: Vitals:   03/14/23 0311 03/14/23 0500 03/14/23 0842 03/14/23 1511  BP:  (!) 108/39 124/63 109/64  Pulse:  94 87 95  Resp:  16 16 16   Temp:  98.3 F (36.8 C) 98 F (36.7 C) 98.6 F (37 C)  TempSrc:  Oral Oral   SpO2:  100% 95% 92%  Weight: 85.4 kg     Height:        Intake/Output Summary (Last 24 hours) at 03/14/2023 2001 Last data filed at 03/14/2023 1448 Gross per 24 hour  Intake 120 ml  Output 100 ml  Net 20 ml   Filed Weights   03/12/23 0124 03/13/23 0200 03/14/23 0311  Weight: 82.3 kg 80.9 kg 85.4 kg    Examination:   Constitutional: NAD, lethargic, would say his name HEENT: conjunctivae and lids normal, EOMI CV: No cyanosis.   RESP: normal respiratory effort, on RA   Data Reviewed: I have personally reviewed labs and imaging studies  Time spent: 50 minutes  Darlin Priestly, MD [facility] If 7PM-7AM, please contact night-coverage 03/14/2023, 8:01 PM

## 2023-03-15 DIAGNOSIS — I7143 Infrarenal abdominal aortic aneurysm, without rupture: Secondary | ICD-10-CM | POA: Diagnosis present

## 2023-03-15 DIAGNOSIS — E0590 Thyrotoxicosis, unspecified without thyrotoxic crisis or storm: Secondary | ICD-10-CM | POA: Diagnosis present

## 2023-03-15 DIAGNOSIS — R7881 Bacteremia: Secondary | ICD-10-CM | POA: Diagnosis present

## 2023-03-15 DIAGNOSIS — J449 Chronic obstructive pulmonary disease, unspecified: Secondary | ICD-10-CM | POA: Diagnosis present

## 2023-03-15 DIAGNOSIS — L89312 Pressure ulcer of right buttock, stage 2: Secondary | ICD-10-CM | POA: Diagnosis not present

## 2023-03-15 DIAGNOSIS — F03A3 Unspecified dementia, mild, with mood disturbance: Secondary | ICD-10-CM | POA: Diagnosis present

## 2023-03-15 DIAGNOSIS — Z1152 Encounter for screening for COVID-19: Secondary | ICD-10-CM | POA: Diagnosis not present

## 2023-03-15 DIAGNOSIS — I350 Nonrheumatic aortic (valve) stenosis: Secondary | ICD-10-CM | POA: Diagnosis present

## 2023-03-15 DIAGNOSIS — R2681 Unsteadiness on feet: Secondary | ICD-10-CM | POA: Diagnosis not present

## 2023-03-15 DIAGNOSIS — S0101XA Laceration without foreign body of scalp, initial encounter: Secondary | ICD-10-CM | POA: Diagnosis present

## 2023-03-15 DIAGNOSIS — B9620 Unspecified Escherichia coli [E. coli] as the cause of diseases classified elsewhere: Secondary | ICD-10-CM | POA: Diagnosis present

## 2023-03-15 DIAGNOSIS — F319 Bipolar disorder, unspecified: Secondary | ICD-10-CM | POA: Diagnosis present

## 2023-03-15 DIAGNOSIS — W19XXXA Unspecified fall, initial encounter: Secondary | ICD-10-CM | POA: Diagnosis present

## 2023-03-15 DIAGNOSIS — E871 Hypo-osmolality and hyponatremia: Secondary | ICD-10-CM | POA: Diagnosis present

## 2023-03-15 DIAGNOSIS — F200 Paranoid schizophrenia: Secondary | ICD-10-CM | POA: Diagnosis present

## 2023-03-15 DIAGNOSIS — Z1612 Extended spectrum beta lactamase (ESBL) resistance: Secondary | ICD-10-CM | POA: Diagnosis present

## 2023-03-15 DIAGNOSIS — Z23 Encounter for immunization: Secondary | ICD-10-CM | POA: Diagnosis present

## 2023-03-15 DIAGNOSIS — I10 Essential (primary) hypertension: Secondary | ICD-10-CM | POA: Diagnosis present

## 2023-03-15 DIAGNOSIS — R509 Fever, unspecified: Secondary | ICD-10-CM | POA: Diagnosis not present

## 2023-03-15 DIAGNOSIS — L89322 Pressure ulcer of left buttock, stage 2: Secondary | ICD-10-CM | POA: Diagnosis not present

## 2023-03-15 DIAGNOSIS — K5900 Constipation, unspecified: Secondary | ICD-10-CM | POA: Diagnosis not present

## 2023-03-15 DIAGNOSIS — R55 Syncope and collapse: Secondary | ICD-10-CM | POA: Diagnosis present

## 2023-03-15 LAB — BLOOD CULTURE ID PANEL (REFLEXED) - BCID2

## 2023-03-15 LAB — MAGNESIUM: Magnesium: 2.4 mg/dL (ref 1.7–2.4)

## 2023-03-15 LAB — BASIC METABOLIC PANEL
Anion gap: 9 (ref 5–15)
BUN: 36 mg/dL — ABNORMAL HIGH (ref 8–23)
CO2: 25 mmol/L (ref 22–32)
Calcium: 9.6 mg/dL (ref 8.9–10.3)
Chloride: 99 mmol/L (ref 98–111)
Creatinine, Ser: 0.93 mg/dL (ref 0.61–1.24)
GFR, Estimated: >60 mL/min (ref >60)
Glucose, Bld: 112 mg/dL — ABNORMAL HIGH (ref 70–99)
Potassium: 4.3 mmol/L (ref 3.5–5.1)
Sodium: 133 mmol/L — ABNORMAL LOW (ref 135–145)

## 2023-03-15 LAB — OSMOLALITY: Osmolality: 292 mOsm/kg (ref 275–295)

## 2023-03-15 LAB — CBC
HCT: 40.2 % (ref 39.0–52.0)
Hemoglobin: 13.2 g/dL (ref 13.0–17.0)
MCH: 29.7 pg (ref 26.0–34.0)
MCHC: 32.8 g/dL (ref 30.0–36.0)
MCV: 90.3 fL (ref 80.0–100.0)
Platelets: 136 10*3/uL — ABNORMAL LOW (ref 150–400)
RBC: 4.45 MIL/uL (ref 4.22–5.81)
RDW: 13.3 % (ref 11.5–15.5)
WBC: 8.3 10*3/uL (ref 4.0–10.5)
nRBC: 0 % (ref 0.0–0.2)

## 2023-03-15 LAB — SODIUM: Sodium: 131 mmol/L — ABNORMAL LOW (ref 135–145)

## 2023-03-15 LAB — GLUCOSE, CAPILLARY: Glucose-Capillary: 105 mg/dL — ABNORMAL HIGH (ref 70–99)

## 2023-03-15 LAB — PHOSPHORUS: Phosphorus: 3.5 mg/dL (ref 2.5–4.6)

## 2023-03-15 MED ORDER — SODIUM CHLORIDE 0.9 % IV SOLN
1.0000 g | Freq: Three times a day (TID) | INTRAVENOUS | Status: AC
  Administered 2023-03-15 – 2023-03-18 (×11): 1 g via INTRAVENOUS
  Filled 2023-03-15 (×12): qty 20

## 2023-03-15 NOTE — Progress Notes (Signed)
[facility] Progress Note  Patient: [name]    QMV:[id number]  DOA: 03/04/2023     Date of Service: the patient was seen and examined on 03/15/2023  Chief Complaint  Patient presents with   Fall   Head Injury   Brief hospital course: 72 year old male with past medical history significant for hypertension, bipolar disorder, COPD, schizophrenia and cognitive deficit, presented from group home to emergency department for evaluation of fall.    Patient sustained laceration that was repaired in the ED. Patient is admitted to hospitalist service for evaluation of syncope/dizziness. Patient had imaging studies including CT head, spine did not reveal any acute process.  Baseline cognitive deficits, he need supervision, high risk for fall, not safe to go home. TOC working with APS for [employer], legal guardian.   Assessment and Plan:   ESBL E. coli bacteremia, unknown source 8/25 blood culture growing ESBL E. Coli Follow sensitivity report Continue meropenem 1 g IV 3 times daily pharmacy consulted for dosing Follow UA and urine culture Bladder scan ruled out urinary retention   Syncope and collapse:  Unsteady gait and fall Positive for orthostatic vitals.  TTE without significant finding.  Has unsteady gait.  Received IV fluid hydration. TOC working with APS, need legal guardian, placement. -Continue fall precaution --repeat orthostatic BP when able --PT/OT rec SNF   Elevated TSH:  TSH elevated to 34, however, free T4 is wnl.  Pt has methimazole on PTA home med list. --Initially started on Synthroid 25 mcg daily which was later increased to 75 mcg daily on 8/23.   Plan: --if pt was taking methimazole for hyperthyroidism PTA, then instead of starting Synthroid for elevated TSH, can just hold methimazole for now. --d/c Synthroid today -Repeat TSH and free T4 in 4 to 6 weeks   Schizophrenia (HCC): Stable -Continue Celexa Prolixin Risperdal    COPD: Stable    Hyperlipidemia -cont home Zocor    Essential hypertension -hold home amlodipine and Lisinopril for now since BP is wnl to soft, and pt presented with orthostasis.   Isotonic hyponatremia, most likely due to poor oral intake Serum osmolality 292 --monitor Na  Body mass index is 26.21 kg/m.  Interventions:  Diet: Regular diet DVT Prophylaxis: Subcutaneous Lovenox   Advance goals of care discussion: Full code  Family Communication: family was not present at bedside, at the time of interview.  The pt provided permission to discuss medical plan with the family. Opportunity was given to ask question and all questions were answered satisfactorily.   Disposition:  Pt is from group home, admitted with syncope and collapse, found to have bacteremia ESBL E. coli, still on antibiotics. DC plan to rehab, APS involved. Follow TOC for discharge planning.  Subjective: No significant events overnight, patient was sleepy, woke up and stated that he is feeling fine and then dozed off again.  Patient seemed somnolent and lethargic.  RN was advised to keep close eye, vital signs stable.  Physical Exam: General: NAD, lying comfortably Appear in no distress, affect appropriate Eyes: PERRLA ENT: Oral Mucosa Clear, moist  Neck: no JVD,  Cardiovascular: S1 and S2 Present, no Murmur,  Respiratory: good respiratory effort, Bilateral Air entry equal and Decreased, no Crackles, no wheezes Abdomen: Bowel Sound present, Soft and no tenderness,  Skin: no rashes Extremities: no Pedal edema, no calf tenderness Neurologic: without any new focal findings Gait not checked due to patient safety concerns  Vitals:   03/15/23 0500 03/15/23 0517 03/15/23 0838 03/15/23 1349  BP:  120/61 117/62 (!) 105/54  Pulse:  78 77 66  Resp:   16 16  Temp:  98.1 F (36.7 C) (!) 97.4 F (36.3 C) 97.7 F (36.5 C)  TempSrc:  Oral  Axillary  SpO2:  93% 94% 95%  Weight: 80.5 kg     Height:       No intake or output  data in the 24 hours ending 03/15/23 1640 Filed Weights   03/13/23 0200 03/14/23 0311 03/15/23 0500  Weight: 80.9 kg 85.4 kg 80.5 kg    Data Reviewed: I have personally reviewed and interpreted daily labs, tele strips, imagings as discussed above. I reviewed all nursing notes, pharmacy notes, vitals, pertinent old records I have discussed plan of care as described above with RN and patient/family.  CBC: Recent Labs  Lab 03/14/23 0521 03/15/23 1042  WBC 7.3 8.3  NEUTROABS 5.2  --   HGB 13.3 13.2  HCT 40.2 40.2  MCV 90.3 90.3  PLT 143* 136*   Basic Metabolic Panel: Recent Labs  Lab 03/14/23 0521 03/15/23 0501 03/15/23 1042  NA 130* 131* 133*  K 4.2  --  4.3  CL 99  --  99  CO2 22  --  25  GLUCOSE 140*  --  112*  BUN 23  --  36*  CREATININE 0.96  --  0.93  CALCIUM 9.2  --  9.6  MG 2.4  --  2.4  PHOS 4.0  --  3.5    Studies: No results found.  Scheduled Meds:  citalopram  40 mg Oral Daily   docusate sodium  100 mg Oral BID   enoxaparin (LOVENOX) injection  40 mg Subcutaneous QPM   fluPHENAZine  5 mg Oral Daily   risperiDONE  1 mg Oral Daily   simvastatin  20 mg Oral Daily   Continuous Infusions:  meropenem (MERREM) IV 1 g (03/15/23 1619)   PRN Meds: acetaminophen **OR** acetaminophen, bisacodyl, hydrALAZINE, ondansetron **OR** ondansetron (ZOFRAN) IV, polyethylene glycol, sodium phosphate  Time spent: 35 minutes  Author: Gillis Santa. MD [facility] 03/15/2023 4:40 PM  To reach On-call, see care teams to locate the attending and reach out to them via [email or web address]. If 7PM-7AM, please contact night-coverage If you still have difficulty reaching the attending provider, please page the [facility] (Director on Call) for [facility] on amion for assistance.

## 2023-03-15 NOTE — Progress Notes (Signed)
PHARMACY - PHYSICIAN COMMUNICATION CRITICAL VALUE ALERT - BLOOD CULTURE IDENTIFICATION (BCID)  [name] is an 72 y.o. male who presented to [facility] on 03/04/2023 with a chief complaint of syncope/fall  Assessment:  E Coli in 2 of 4 bottles, CTX resistance detected  (include suspected source if known)  Name of physician (or Provider) Contacted: [facility]   Current antibiotics: none   Changes to prescribed antibiotics recommended:  Recommendations accepted by provider  Will start meropenem 1 gm IV Q8H on 8/26 @ 0500.   Results for orders placed or performed during the hospital encounter of 03/04/23  Blood Culture ID Panel (Reflexed) (Collected: 03/14/2023 12:45 PM)  Result Value Ref Range   Enterococcus faecalis NOT DETECTED NOT DETECTED   Enterococcus Faecium NOT DETECTED NOT DETECTED   Listeria monocytogenes NOT DETECTED NOT DETECTED   Staphylococcus species NOT DETECTED NOT DETECTED   Staphylococcus aureus (BCID) NOT DETECTED NOT DETECTED   Staphylococcus epidermidis NOT DETECTED NOT DETECTED   Staphylococcus lugdunensis NOT DETECTED NOT DETECTED   Streptococcus species NOT DETECTED NOT DETECTED   Streptococcus agalactiae NOT DETECTED NOT DETECTED   Streptococcus pneumoniae NOT DETECTED NOT DETECTED   Streptococcus pyogenes NOT DETECTED NOT DETECTED   A.calcoaceticus-baumannii NOT DETECTED NOT DETECTED   Bacteroides fragilis NOT DETECTED NOT DETECTED   Enterobacterales DETECTED (A) NOT DETECTED   Enterobacter cloacae complex NOT DETECTED NOT DETECTED   Escherichia coli DETECTED (A) NOT DETECTED   Klebsiella aerogenes NOT DETECTED NOT DETECTED   Klebsiella oxytoca NOT DETECTED NOT DETECTED   Klebsiella pneumoniae NOT DETECTED NOT DETECTED   Proteus species NOT DETECTED NOT DETECTED   Salmonella species NOT DETECTED NOT DETECTED   Serratia marcescens NOT DETECTED NOT DETECTED   Haemophilus influenzae NOT DETECTED NOT DETECTED   Neisseria meningitidis NOT DETECTED NOT  DETECTED   Pseudomonas aeruginosa NOT DETECTED NOT DETECTED   Stenotrophomonas maltophilia NOT DETECTED NOT DETECTED   Candida albicans NOT DETECTED NOT DETECTED   Candida auris NOT DETECTED NOT DETECTED   Candida glabrata NOT DETECTED NOT DETECTED   Candida krusei NOT DETECTED NOT DETECTED   Candida parapsilosis NOT DETECTED NOT DETECTED   Candida tropicalis NOT DETECTED NOT DETECTED   Cryptococcus neoformans/gattii NOT DETECTED NOT DETECTED   CTX-M ESBL DETECTED (A) NOT DETECTED   Carbapenem resistance IMP NOT DETECTED NOT DETECTED   Carbapenem resistance KPC NOT DETECTED NOT DETECTED   Carbapenem resistance NDM NOT DETECTED NOT DETECTED   Carbapenem resist OXA 48 LIKE NOT DETECTED NOT DETECTED   Carbapenem resistance VIM NOT DETECTED NOT DETECTED    [name] D 03/15/2023  4:58 AM

## 2023-03-15 NOTE — Progress Notes (Signed)
Pharmacy Antibiotic Note  [name] is a 72 y.o. male admitted on 03/04/2023 with bacteremia and ESBL infection .  Pharmacy has been consulted for meropenem dosing.  E Coli in 2 of 4 bottles on BCID, CTX resistance detected.   Plan: Meropenem 1 gm IV Q8H ordered to start on 8/26 @ 0500.   Height: 5\' 9"  (175.3 cm) Weight: 85.4 kg (188 lb 4.4 oz) IBW/kg (Calculated) : 70.7  Temp (24hrs), Avg:98.9 F (37.2 C), Min:98 F (36.7 C), Max:100.1 F (37.8 C)  Recent Labs  Lab 03/14/23 0521  WBC 7.3  CREATININE 0.96    Estimated Creatinine Clearance: 75.4 mL/min (by C-G formula based on SCr of 0.96 mg/dL).    Allergies  Allergen Reactions   Asa [Aspirin]     Hx GI bleed    Antimicrobials this admission:   >>    >>   Dose adjustments this admission:   Microbiology results:  BCx:   UCx:    Sputum:    MRSA PCR:   Thank you for allowing pharmacy to be a part of this patient's care.  Koy Lamp D 03/15/2023 5:02 AM

## 2023-03-15 NOTE — Progress Notes (Signed)
Pharmacy Antibiotic Note  [name] is a 72 y.o. male admitted on 03/04/2023 with fall. Blood cultures resulted on 8/25 - bacteremia and ESBL infection .  Pharmacy has been consulted for meropenem dosing.  E Coli in 2 of 4 bottles on BCID, CTX-M resistance detected.   Plan: Continue meropenem 1 gm IV Q8H  Height: 5\' 9"  (175.3 cm) Weight: 80.5 kg (177 lb 7.5 oz) IBW/kg (Calculated) : 70.7  Temp (24hrs), Avg:98.4 F (36.9 C), Min:97.4 F (36.3 C), Max:100.1 F (37.8 C)  Recent Labs  Lab 03/14/23 0521 03/15/23 1042  WBC 7.3 8.3  CREATININE 0.96 0.93    Estimated Creatinine Clearance: 71.8 mL/min (by C-G formula based on SCr of 0.93 mg/dL).    Allergies  Allergen Reactions   Asa [Aspirin]     Hx GI bleed    Antimicrobials this admission: Meropenem 8/25  >>   Dose adjustments this admission: N/a  Microbiology results:  BCx: 2/4 E. Coli - CTX-M resistance. ESBL  UCx:    Sputum:    MRSA PCR:   Thank you for allowing pharmacy to be a part of this patient's care.  Elliot Gurney, PharmD, BCPS Clinical Pharmacist  03/15/2023 2:21 PM

## 2023-03-15 NOTE — Progress Notes (Signed)
PT Cancellation Note  Patient Details Name: [name] MRN: [id number] DOB: [birth year]   Cancelled Treatment:     Pt tool lethargic and declines any activity this pm. Lunch tray untouched. Will re-attempt next available date/time per POC.   [name] 03/15/2023, 3:32 PM

## 2023-03-16 ENCOUNTER — Inpatient Hospital Stay: Payer: Medicare HMO

## 2023-03-16 DIAGNOSIS — R55 Syncope and collapse: Secondary | ICD-10-CM | POA: Diagnosis not present

## 2023-03-16 DIAGNOSIS — R509 Fever, unspecified: Secondary | ICD-10-CM

## 2023-03-16 DIAGNOSIS — F319 Bipolar disorder, unspecified: Secondary | ICD-10-CM

## 2023-03-16 DIAGNOSIS — Z1612 Extended spectrum beta lactamase (ESBL) resistance: Secondary | ICD-10-CM | POA: Diagnosis not present

## 2023-03-16 DIAGNOSIS — B9620 Unspecified Escherichia coli [E. coli] as the cause of diseases classified elsewhere: Secondary | ICD-10-CM

## 2023-03-16 LAB — URINALYSIS, COMPLETE (UACMP) WITH MICROSCOPIC
Bacteria, UA: NONE SEEN
Bilirubin Urine: NEGATIVE
Glucose, UA: NEGATIVE mg/dL
Ketones, ur: NEGATIVE mg/dL
Leukocytes,Ua: NEGATIVE
Nitrite: NEGATIVE
Protein, ur: 30 mg/dL — AB
Specific Gravity, Urine: 1.025 (ref 1.005–1.030)
pH: 5 (ref 5.0–8.0)

## 2023-03-16 LAB — GLUCOSE, CAPILLARY: Glucose-Capillary: 109 mg/dL — ABNORMAL HIGH (ref 70–99)

## 2023-03-16 NOTE — Progress Notes (Cosign Needed Addendum)
Pharmacy Antibiotic Note  [name] is a 72 y.o. male admitted on 03/04/2023 with a fall and associated head injury. PMH consists of HTN, bipolar disorder, COPD, schizophrenia, and cognitive deficit. Blood cultures resulted on 8/25 - bacteremia and ESBL infection .  Pharmacy has been consulted for meropenem dosing. E Coli in 2 of 4 bottles on BCID, CTX-M resistance detected.   Plan: Day 2 of meropenem - continue 1 gram IV Q8H based on renal function  SCr 0.93 today with estimated CrCl 71.8 mL/min Pharmacy will continue to monitor and dose adjust appropriately   Height: 5\' 9"  (175.3 cm) Weight: 80.5 kg (177 lb 7.5 oz) IBW/kg (Calculated) : 70.7  Temp (24hrs), Avg:98.9 F (37.2 C), Min:98.6 F (37 C), Max:99.1 F (37.3 C)  Recent Labs  Lab 03/14/23 0521 03/15/23 1042  WBC 7.3 8.3  CREATININE 0.96 0.93    Estimated Creatinine Clearance: 71.8 mL/min (by C-G formula based on SCr of 0.93 mg/dL).    Allergies  Allergen Reactions   Asa [Aspirin]     Hx GI bleed   Antimicrobials this admission: Meropenem 8/25  >>   Dose adjustments this admission: N/A  Microbiology results: 8/25 BCx: 2/4 E. Coli - CTX-M resistance. ESBL  Thank you for allowing pharmacy to be a part of this patient's care.  Littie Deeds, PharmD PGY1 Pharmacy Resident 03/16/2023 3:44 PM

## 2023-03-16 NOTE — Consult Note (Incomplete)
NAME: [name]  DOB: [birth year]  MRN: [id number]  Date/Time: 03/16/2023 3:20 PM  REQUESTING PROVIDER: Dr. Lucianne Muss Subjective:  REASON FOR CONSULT: ESBL ecoli bacteremia  no history available from patient [name] is a 72 y.o. with a history of Schizophrenia,HTN, HLD, COPD, bipolar disorder presented on 8/15 brought in by EMS after a fall at [facility].  He was with his friend.  He had hurt his head.  Patient had told EMS that he was not feeling well for a few days. Patient came to the ED and had sutures to his head Vitals in the ED BP of 123/67, temperature 98.2, pulse 90, respiratory rate 18 and sats 96%.  WBC was 7.9, Hb 13.9, platelet 132, creatinine 1.05.  Patient admitting diagnosis was syncope and collapse.  There were concerned about orthostatic hypotension. Was found to have a high TSH level normal T4 and was started on levothyroxine. CT head no acute findings. 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 On 8/18-8/25 he had low level fever and blood culture was sent on  8/25 ae , and as Blood culture showed ESBL ecoli he was switched to meropenem and I am asked to see him He is incontinent of urine  Past Medical History:  Diagnosis Date  . Bipolar disorder (HCC)   . COPD (chronic obstructive pulmonary disease) (HCC)   . Depression   . GERD (gastroesophageal reflux disease)   . GIB (gastrointestinal bleeding)   . Hyperlipidemia   . Hypertension   . Mild dementia (HCC)   . Paranoid schizophrenia (HCC)   . Smoker   . Tobacco dependence     History reviewed. No pertinent surgical history.  Social History   Socioeconomic History  . Marital status: Single    Spouse name: Not on file  . Number of children: Not on file  . Years of education: Not on file  . Highest education level: Not on file  Occupational History  . Not on file  Tobacco Use  . Smoking status: Every Day    Current packs/day: 1.00    Types: Cigarettes  . Smokeless  tobacco: Never  Vaping Use  . Vaping status: Never Used  Substance and Sexual Activity  . Alcohol use: No    Alcohol/week: 0.0 standard drinks of alcohol  . Drug use: No  . Sexual activity: Never  Other Topics Concern  . Not on file  Social History Narrative  . Not on file   Social Determinants of Health   Financial Resource Strain: Not on file  Food Insecurity: Patient Unable To Answer (03/05/2023)   Hunger Vital Sign   . Worried About Programme researcher, broadcasting/film/video in the Last Year: Patient unable to answer   . Ran Out of Food in the Last Year: Patient unable to answer  Transportation Needs: Patient Unable To Answer (03/05/2023)   PRAPARE - Transportation   . Lack of Transportation (Medical): Patient unable to answer   . Lack of Transportation (Non-Medical): Patient unable to answer  Physical Activity: Not on file  Stress: Not on file  Social Connections: Not on file  Intimate Partner Violence: Patient Unable To Answer (03/05/2023)   Humiliation, Afraid, Rape, and Kick questionnaire   . Fear of Current or Ex-Partner: Patient unable to answer   . Emotionally Abused: Patient unable to answer   . Physically Abused: Patient unable to answer   . Sexually Abused: Patient unable to answer    Family History  Family history unknown:  Yes   Allergies  Allergen Reactions  . Asa [Aspirin]     Hx GI bleed   I  Current Facility-Administered Medications  Medication Dose Route Frequency Provider Last Rate Last Admin  . acetaminophen (TYLENOL) tablet 650 mg  650 mg Oral Q6H PRN Gertha Calkin, MD   650 mg at 03/16/23 1610   Or  . acetaminophen (TYLENOL) suppository 650 mg  650 mg Rectal Q6H PRN Gertha Calkin, MD      . bisacodyl (DULCOLAX) EC tablet 5 mg  5 mg Oral Daily PRN Gertha Calkin, MD   5 mg at 03/16/23 0932  . citalopram (CELEXA) tablet 40 mg  40 mg Oral Daily Gertha Calkin, MD   40 mg at 03/16/23 0932  . docusate sodium (COLACE) capsule 100 mg  100 mg Oral BID Gertha Calkin, MD   100 mg  at 03/16/23 0933  . enoxaparin (LOVENOX) injection 40 mg  40 mg Subcutaneous QPM Gillis Santa, MD   40 mg at 03/15/23 2147  . fluPHENAZine (PROLIXIN) tablet 5 mg  5 mg Oral Daily Irena Cords V, MD   5 mg at 03/16/23 0934  . hydrALAZINE (APRESOLINE) injection 10 mg  10 mg Intravenous Q6H PRN Gertha Calkin, MD      . meropenem (MERREM) 1 g in sodium chloride 0.9 % 100 mL IVPB  1 g Intravenous Q8H Darlin Priestly, MD 200 mL/hr at 03/16/23 1009 1 g at 03/16/23 1009  . ondansetron (ZOFRAN) tablet 4 mg  4 mg Oral Q6H PRN Gertha Calkin, MD       Or  . ondansetron (ZOFRAN) injection 4 mg  4 mg Intravenous Q6H PRN Gertha Calkin, MD      . polyethylene glycol (MIRALAX / GLYCOLAX) packet 17 g  17 g Oral Daily PRN Gertha Calkin, MD   17 g at 03/16/23 0932  . risperiDONE (RISPERDAL) tablet 1 mg  1 mg Oral Daily Gertha Calkin, MD   1 mg at 03/16/23 0932  . simvastatin (ZOCOR) tablet 20 mg  20 mg Oral Daily Darlin Priestly, MD   20 mg at 03/16/23 0932  . sodium phosphate (FLEET) enema 1 enema  1 enema Rectal Once PRN Gertha Calkin, MD         Abtx:  Anti-infectives (From admission, onward)    Start     Dose/Rate Route Frequency Ordered Stop   03/15/23 0545  meropenem (MERREM) 1 g in sodium chloride 0.9 % 100 mL IVPB        1 g 200 mL/hr over 30 Minutes Intravenous Every 8 hours 03/15/23 0457         REVIEW OF SYSTEMS:  NA  Objective:  VITALS:  BP 122/64 (BP Location: Right Arm)   Pulse 88   Temp 98.6 F (37 C) (Oral)   Resp 18   Ht 5\' 9"  (1.753 m)   Wt 80.5 kg   SpO2 96%   BMI 26.21 kg/m   PHYSICAL EXAM:  General:Awake, oriented in person Follows some commands Minimally verbal Head: Normocephalic, without obvious abnormality, atraumatic. Eyes: Conjunctivae clear, anicteric sclerae. Pupils are equal ENT Nares normal. No drainage or sinus tenderness. Poor denition Neck: Supple, symmetrical, no adenopathy, thyroid: non tender no carotid bruit and no JVD. Back: No CVA tenderness. Lungs: Clear  to auscultation bilaterally. No Wheezing or Rhonchi. No rales. Heart: Regular rate and rhythm, no murmur, rub or gallop. Abdomen: Soft, non-tender,not distended. Bowel sounds normal. No  masses Extremities: atraumatic, no cyanosis. No edema. No clubbing Skin: No rashes or lesions. Or bruising Lymph: Cervical, supraclavicular normal. Neurologic: increased tone limbs Grossly non-focal Pertinent Labs Lab Results CBC    Component Value Date/Time   WBC 8.3 03/15/2023 1042   RBC 4.45 03/15/2023 1042   HGB 13.2 03/15/2023 1042   HGB 14.4 01/14/2016 1155   HCT 40.2 03/15/2023 1042   HCT 43.5 01/14/2016 1155   PLT 136 (L) 03/15/2023 1042   PLT 113 (L) 01/14/2016 1155   MCV 90.3 03/15/2023 1042   MCV 93 01/14/2016 1155   MCV 88 01/09/2013 0432   MCH 29.7 03/15/2023 1042   MCHC 32.8 03/15/2023 1042   RDW 13.3 03/15/2023 1042   RDW 14.5 01/14/2016 1155   RDW 14.8 (H) 01/09/2013 0432   LYMPHSABS 1.0 03/14/2023 0521   LYMPHSABS 1.2 01/09/2013 0432   MONOABS 0.8 03/14/2023 0521   MONOABS 0.8 01/09/2013 0432   EOSABS 0.3 03/14/2023 0521   EOSABS 0.2 01/09/2013 0432   BASOSABS 0.0 03/14/2023 0521   BASOSABS 0.0 01/09/2013 0432       Latest Ref Rng & Units 03/15/2023   10:42 AM 03/15/2023    5:01 AM 03/14/2023    5:21 AM  CMP  Glucose 70 - 99 mg/dL 657   846   BUN 8 - 23 mg/dL 36   23   Creatinine 9.62 - 1.24 mg/dL 9.52   8.41   Sodium 324 - 145 mmol/L 133  131  130   Potassium 3.5 - 5.1 mmol/L 4.3   4.2   Chloride 98 - 111 mmol/L 99   99   CO2 22 - 32 mmol/L 25   22   Calcium 8.9 - 10.3 mg/dL 9.6   9.2       Microbiology: Recent Results (from the past 240 hour(s))  SARS Coronavirus 2 by RT PCR (hospital order, performed in [facility] hospital lab) *cepheid single result test* Anterior Nasal Swab     Status: None   Collection Time: 03/14/23 11:13 AM   Specimen: Anterior Nasal Swab  Result Value Ref Range Status   SARS Coronavirus 2 by RT PCR NEGATIVE NEGATIVE Final     Comment: (NOTE) SARS-CoV-2 target nucleic acids are NOT DETECTED.  The SARS-CoV-2 RNA is generally detectable in upper and lower respiratory specimens during the acute phase of infection. The lowest concentration of SARS-CoV-2 viral copies this assay can detect is 250 copies / mL. A negative result does not preclude SARS-CoV-2 infection and should not be used as the sole basis for treatment or other patient management decisions.  A negative result may occur with improper specimen collection / handling, submission of specimen other than nasopharyngeal swab, presence of viral mutation(s) within the areas targeted by this assay, and inadequate number of viral copies (<250 copies / mL). A negative result must be combined with clinical observations, patient history, and epidemiological information.  Fact Sheet for Patients:   RoadLapTop.co.za  Fact Sheet for Healthcare Providers: http://kim-miller.com/  This test is not yet approved or  cleared by the [place] FDA and has been authorized for detection and/or diagnosis of SARS-CoV-2 by FDA under an Emergency Use Authorization (EUA).  This EUA will remain in effect (meaning this test can be used) for the duration of the COVID-19 declaration under Section 564(b)(1) of the Act, 21 U.S.C. section 360bbb-3(b)(1), unless the authorization is terminated or revoked sooner.  Performed at [facility], [address], [place], [place] [address]  Culture, blood (Routine X 2) w Reflex to ID Panel     Status: Abnormal (Preliminary result)   Collection Time: 03/14/23 12:45 PM   Specimen: BLOOD  Result Value Ref Range Status   Specimen Description   Final    BLOOD BLOOD LEFT HAND Performed at [facility], [address] [place], [place] [address]    Special Requests   Final    BOTTLES DRAWN AEROBIC AND ANAEROBIC Blood Culture adequate volume Performed at [facility], [address], [place], [place] [address]    Culture  Setup Time   Final    GRAM NEGATIVE RODS ANAEROBIC BOTTLE ONLY Organism ID to follow CRITICAL RESULT CALLED TO, READ BACK BY AND VERIFIED WITH: JASON ROBBINS @ 0322 03/14/23 L[facility] GRAM STAIN REVIEWED-AGREE WITH RESULT Performed at [facility], [address] [place], [place] [address]    Culture ESCHERICHIA COLI (A)  Final   Report Status PENDING  Incomplete  Blood Culture ID Panel (Reflexed)     Status: Abnormal   Collection Time: 03/14/23 12:45 PM  Result Value Ref Range Status   Enterococcus faecalis NOT DETECTED NOT DETECTED Final   Enterococcus Faecium NOT DETECTED NOT DETECTED Final   Listeria monocytogenes NOT DETECTED NOT DETECTED Final   Staphylococcus species NOT DETECTED NOT DETECTED Final   Staphylococcus aureus (BCID) NOT DETECTED NOT DETECTED Final   Staphylococcus epidermidis NOT DETECTED NOT DETECTED Final   Staphylococcus lugdunensis NOT DETECTED NOT DETECTED Final   Streptococcus species NOT DETECTED NOT DETECTED Final   Streptococcus agalactiae NOT DETECTED NOT DETECTED Final   Streptococcus pneumoniae NOT DETECTED NOT DETECTED Final   Streptococcus pyogenes NOT DETECTED NOT DETECTED Final   A.calcoaceticus-baumannii NOT DETECTED NOT DETECTED Final   Bacteroides fragilis NOT DETECTED NOT DETECTED Final   Enterobacterales DETECTED (A) NOT DETECTED Final    Comment: Enterobacterales represent a large order of gram negative bacteria, not a single organism. CRITICAL RESULT CALLED TO, READ BACK BY AND VERIFIED WITH: JASON ROBBINS @ 0322 03/14/23 LFD    Enterobacter cloacae complex NOT DETECTED NOT DETECTED Final   Escherichia coli DETECTED (A) NOT DETECTED Final    Comment: CRITICAL RESULT CALLED TO, READ BACK BY AND VERIFIED WITH: JASON ROBBINS @ 0322 03/14/23 LFD    Klebsiella aerogenes NOT DETECTED NOT DETECTED Final   Klebsiella oxytoca NOT DETECTED NOT DETECTED Final   Klebsiella pneumoniae  NOT DETECTED NOT DETECTED Final   Proteus species NOT DETECTED NOT DETECTED Final   Salmonella species NOT DETECTED NOT DETECTED Final   Serratia marcescens NOT DETECTED NOT DETECTED Final   Haemophilus influenzae NOT DETECTED NOT DETECTED Final   Neisseria meningitidis NOT DETECTED NOT DETECTED Final   Pseudomonas aeruginosa NOT DETECTED NOT DETECTED Final   Stenotrophomonas maltophilia NOT DETECTED NOT DETECTED Final   Candida albicans NOT DETECTED NOT DETECTED Final   Candida auris NOT DETECTED NOT DETECTED Final   Candida glabrata NOT DETECTED NOT DETECTED Final   Candida krusei NOT DETECTED NOT DETECTED Final   Candida parapsilosis NOT DETECTED NOT DETECTED Final   Candida tropicalis NOT DETECTED NOT DETECTED Final   Cryptococcus neoformans/gattii NOT DETECTED NOT DETECTED Final   CTX-M ESBL DETECTED (A) NOT DETECTED Final    Comment: CRITICAL RESULT CALLED TO, READ BACK BY AND VERIFIED WITH: JASON ROBBINS @ 0322 03/14/23 LFD (NOTE) Extended spectrum beta-lactamase detected. Recommend a carbapenem as initial therapy.      Carbapenem resistance IMP NOT DETECTED NOT DETECTED Final  Carbapenem resistance KPC NOT DETECTED NOT DETECTED Final   Carbapenem resistance NDM NOT DETECTED NOT DETECTED Final   Carbapenem resist OXA 48 LIKE NOT DETECTED NOT DETECTED Final   Carbapenem resistance VIM NOT DETECTED NOT DETECTED Final    Comment: Performed at [facility], [address] [place], [place] [address]  Culture, blood (Routine X 2) w Reflex to ID Panel     Status: None (Preliminary result)   Collection Time: 03/14/23 12:52 PM   Specimen: BLOOD  Result Value Ref Range Status   Specimen Description   Final    BLOOD BLOOD RIGHT HAND Performed at [facility], [address], [place], [place] [address]    Special Requests   Final    BOTTLES DRAWN AEROBIC AND ANAEROBIC Blood Culture adequate volume Performed at [facility], [address]  [place], [place] [address]    Culture  Setup Time   Final    GRAM NEGATIVE RODS AEROBIC BOTTLE ONLY CRITICAL RESULT CALLED TO, READ BACK BY AND VERIFIED WITH: JASON ROBBINS @ 0322 0/25/24 LFD GRAM STAIN REVIEWED-AGREE WITH RESULT    Culture   Final    GRAM NEGATIVE RODS IDENTIFICATION TO FOLLOW Performed at [facility], [address] [place], [place] [address]    Report Status PENDING  Incomplete    IMAGING RESULTS: MRI brain none acute I have personally reviewed the films   Impression/Recommendation 71 yr male presented with syncope,and developed fever a few days later ESBL ecoli bacteremia Source not obvious Need to r/o UTI Bladder scan Urine culture Renal scan May need CT abdomen Continue meropenem- repeat blood culture   Syncope   Bipolar disorder  ___________________________________________________ Discussed with  requesting provider Note:  This document was prepared using Dragon voice recognition software and may include unintentional dictation errors.

## 2023-03-16 NOTE — Consult Note (Signed)
NAME: [name]  DOB: 1950-08-21  MRN: [id number]  Date/Time: 03/16/2023 3:20 PM  REQUESTING PROVIDER: Dr. Lucianne Muss Subjective:  REASON FOR CONSULT: ESBL ecoli bacteremia ?no history available from patient [name] is a 72 y.o. with a history of Schizophrenia,HTN, HLD, COPD, bipolar disorder presented on 8/15 brought in by EMS after a fall at [facility].  He was with his friend.  He had hurt his head.  Patient had told EMS that he was not feeling well for a few days. Patient came to the ED and had sutures to his head Vitals in the ED BP of 123/67, temperature 98.2, pulse 90, respiratory rate 18 and sats 96%.  WBC was 7.9, Hb 13.9, platelet 132, creatinine 1.05.  Patient admitting diagnosis was syncope and collapse.  There were concerned about orthostatic hypotension. Was found to have a high TSH level normal T4 and was started on levothyroxine. CT head no acute findings. 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 On 8/18-8/25 he had low level fever and blood culture was sent on  8/25 ae , and as Blood culture showed ESBL ecoli he was switched to meropenem and I am asked to see him He is incontinent of urine  Past Medical History:  Diagnosis Date   Bipolar disorder (HCC)    COPD (chronic obstructive pulmonary disease) (HCC)    Depression    GERD (gastroesophageal reflux disease)    GIB (gastrointestinal bleeding)    Hyperlipidemia    Hypertension    Mild dementia (HCC)    Paranoid schizophrenia (HCC)    Smoker    Tobacco dependence     History reviewed. No pertinent surgical history.  Social History   Socioeconomic History   Marital status: Single    Spouse name: Not on file   Number of children: Not on file   Years of education: Not on file   Highest education level: Not on file  Occupational History   Not on file  Tobacco Use   Smoking status: Every Day    Current packs/day: 1.00    Types: Cigarettes   Smokeless tobacco: Never   Vaping Use   Vaping status: Never Used  Substance and Sexual Activity   Alcohol use: No    Alcohol/week: 0.0 standard drinks of alcohol   Drug use: No   Sexual activity: Never  Other Topics Concern   Not on file  Social History Narrative   Not on file   Social Determinants of Health   Financial Resource Strain: Not on file  Food Insecurity: Patient Unable To Answer (03/05/2023)   Hunger Vital Sign    Worried About Running Out of Food in the Last Year: Patient unable to answer    Ran Out of Food in the Last Year: Patient unable to answer  Transportation Needs: Patient Unable To Answer (03/05/2023)   PRAPARE - Transportation    Lack of Transportation (Medical): Patient unable to answer    Lack of Transportation (Non-Medical): Patient unable to answer  Physical Activity: Not on file  Stress: Not on file  Social Connections: Not on file  Intimate Partner Violence: Patient Unable To Answer (03/05/2023)   Humiliation, Afraid, Rape, and Kick questionnaire    Fear of Current or Ex-Partner: Patient unable to answer    Emotionally Abused: Patient unable to answer    Physically Abused: Patient unable to answer    Sexually Abused: Patient unable to answer    Family History  Family history unknown: Yes  Allergies  Allergen Reactions   Asa [Aspirin]     Hx GI bleed   I? Current Facility-Administered Medications  Medication Dose Route Frequency Provider Last Rate Last Admin   acetaminophen (TYLENOL) tablet 650 mg  650 mg Oral Q6H PRN Gertha Calkin, MD   650 mg at 03/16/23 1607   Or   acetaminophen (TYLENOL) suppository 650 mg  650 mg Rectal Q6H PRN Gertha Calkin, MD       bisacodyl (DULCOLAX) EC tablet 5 mg  5 mg Oral Daily PRN Gertha Calkin, MD   5 mg at 03/16/23 0932   citalopram (CELEXA) tablet 40 mg  40 mg Oral Daily Gertha Calkin, MD   40 mg at 03/16/23 0932   docusate sodium (COLACE) capsule 100 mg  100 mg Oral BID Gertha Calkin, MD   100 mg at 03/16/23 0933   enoxaparin  (LOVENOX) injection 40 mg  40 mg Subcutaneous QPM Gillis Santa, MD   40 mg at 03/15/23 2147   fluPHENAZine (PROLIXIN) tablet 5 mg  5 mg Oral Daily Gertha Calkin, MD   5 mg at 03/16/23 0934   hydrALAZINE (APRESOLINE) injection 10 mg  10 mg Intravenous Q6H PRN Gertha Calkin, MD       meropenem (MERREM) 1 g in sodium chloride 0.9 % 100 mL IVPB  1 g Intravenous Q8H Darlin Priestly, MD 200 mL/hr at 03/16/23 1009 1 g at 03/16/23 1009   ondansetron (ZOFRAN) tablet 4 mg  4 mg Oral Q6H PRN Gertha Calkin, MD       Or   ondansetron [facility]) injection 4 mg  4 mg Intravenous Q6H PRN Gertha Calkin, MD       polyethylene glycol (MIRALAX / GLYCOLAX) packet 17 g  17 g Oral Daily PRN Gertha Calkin, MD   17 g at 03/16/23 0932   risperiDONE (RISPERDAL) tablet 1 mg  1 mg Oral Daily Gertha Calkin, MD   1 mg at 03/16/23 0932   simvastatin (ZOCOR) tablet 20 mg  20 mg Oral Daily Darlin Priestly, MD   20 mg at 03/16/23 0932   sodium phosphate (FLEET) enema 1 enema  1 enema Rectal Once PRN Gertha Calkin, MD         Abtx:  Anti-infectives (From admission, onward)    Start     Dose/Rate Route Frequency Ordered Stop   03/15/23 0545  meropenem (MERREM) 1 g in sodium chloride 0.9 % 100 mL IVPB        1 g 200 mL/hr over 30 Minutes Intravenous Every 8 hours 03/15/23 0457         REVIEW OF SYSTEMS:  NA Pertinent Positives include : Objective:  VITALS:  BP 122/64 (BP Location: Right Arm)   Pulse 88   Temp 98.6 F (37 C) (Oral)   Resp 18   Ht 5\' 9"  (1.753 m)   Wt 80.5 kg   SpO2 96%   BMI 26.21 kg/m   PHYSICAL EXAM:  General:Awake, oriented in person Follows some commands Minimally verbal Head: Normocephalic, without obvious abnormality, atraumatic. Eyes: Conjunctivae clear, anicteric sclerae. Pupils are equal ENT Nares normal. No drainage or sinus tenderness. Poor denition Neck: Supple, symmetrical, no adenopathy, thyroid: non tender no carotid bruit and no JVD. Back: No CVA tenderness. Lungs: Clear to  auscultation bilaterally. No Wheezing or Rhonchi. No rales. Heart: Regular rate and rhythm, no murmur, rub or gallop. Abdomen: Soft, non-tender,not distended. Bowel sounds normal. No masses  Extremities: atraumatic, no cyanosis. No edema. No clubbing Skin: No rashes or lesions. Or bruising Lymph: Cervical, supraclavicular normal. Neurologic: increased tone limbs Grossly non-focal Pertinent Labs Lab Results CBC    Component Value Date/Time   WBC 8.3 03/15/2023 1042   RBC 4.45 03/15/2023 1042   HGB 13.2 03/15/2023 1042   HGB 14.4 01/14/2016 1155   HCT 40.2 03/15/2023 1042   HCT 43.5 01/14/2016 1155   PLT 136 (L) 03/15/2023 1042   PLT 113 (L) 01/14/2016 1155   MCV 90.3 03/15/2023 1042   MCV 93 01/14/2016 1155   MCV 88 01/09/2013 0432   MCH 29.7 03/15/2023 1042   MCHC 32.8 03/15/2023 1042   RDW 13.3 03/15/2023 1042   RDW 14.5 01/14/2016 1155   RDW 14.8 (H) 01/09/2013 0432   LYMPHSABS 1.0 03/14/2023 0521   LYMPHSABS 1.2 01/09/2013 0432   MONOABS 0.8 03/14/2023 0521   MONOABS 0.8 01/09/2013 0432   EOSABS 0.3 03/14/2023 0521   EOSABS 0.2 01/09/2013 0432   BASOSABS 0.0 03/14/2023 0521   BASOSABS 0.0 01/09/2013 0432       Latest Ref Rng & Units 03/15/2023   10:42 AM 03/15/2023    5:01 AM 03/14/2023    5:21 AM  CMP  Glucose 70 - 99 mg/dL 606   301   BUN 8 - 23 mg/dL 36   23   Creatinine 6.01 - 1.24 mg/dL 0.93   2.35   Sodium 573 - 145 mmol/L 133  131  130   Potassium 3.5 - 5.1 mmol/L 4.3   4.2   Chloride 98 - 111 mmol/L 99   99   CO2 22 - 32 mmol/L 25   22   Calcium 8.9 - 10.3 mg/dL 9.6   9.2       Microbiology: Recent Results (from the past 240 hour(s))  SARS Coronavirus 2 by RT PCR (hospital order, performed in [facility] hospital lab) *cepheid single result test* Anterior Nasal Swab     Status: None   Collection Time: 03/14/23 11:13 AM   Specimen: Anterior Nasal Swab  Result Value Ref Range Status   SARS Coronavirus 2 by RT PCR NEGATIVE NEGATIVE Final    Comment:  (NOTE) SARS-CoV-2 target nucleic acids are NOT DETECTED.  The SARS-CoV-2 RNA is generally detectable in upper and lower respiratory specimens during the acute phase of infection. The lowest concentration of SARS-CoV-2 viral copies this assay can detect is 250 copies / mL. A negative result does not preclude SARS-CoV-2 infection and should not be used as the sole basis for treatment or other patient management decisions.  A negative result may occur with improper specimen collection / handling, submission of specimen other than nasopharyngeal swab, presence of viral mutation(s) within the areas targeted by this assay, and inadequate number of viral copies (<250 copies / mL). A negative result must be combined with clinical observations, patient history, and epidemiological information.  Fact Sheet for Patients:   RoadLapTop.co.za  Fact Sheet for Healthcare Providers: http://kim-miller.com/  This test is not yet approved or  cleared by the [place] FDA and has been authorized for detection and/or diagnosis of SARS-CoV-2 by FDA under an Emergency Use Authorization (EUA).  This EUA will remain in effect (meaning this test can be used) for the duration of the COVID-19 declaration under Section 564(b)(1) of the Act, 21 U.S.C. section 360bbb-3(b)(1), unless the authorization is terminated or revoked sooner.  Performed at [facility], [address], [place], [place] [address]  Culture, blood (Routine X 2) w Reflex to ID Panel     Status: Abnormal (Preliminary result)   Collection Time: 03/14/23 12:45 PM   Specimen: BLOOD  Result Value Ref Range Status   Specimen Description   Final    BLOOD BLOOD LEFT HAND Performed at [facility], [address] [place], [place] [address]    Special Requests   Final    BOTTLES DRAWN AEROBIC AND ANAEROBIC Blood Culture adequate volume Performed at [facility], [address], [place], [place] [address]    Culture  Setup Time   Final    GRAM NEGATIVE RODS ANAEROBIC BOTTLE ONLY Organism ID to follow CRITICAL RESULT CALLED TO, READ BACK BY AND VERIFIED WITH: JASON ROBBINS @ 0322 03/14/23 LFD GRAM STAIN REVIEWED-AGREE WITH RESULT Performed at [facility], [address], [place], [place] [address]    Culture ESCHERICHIA COLI (A)  Final   Report Status PENDING  Incomplete  Blood Culture ID Panel (Reflexed)     Status: Abnormal   Collection Time: 03/14/23 12:45 PM  Result Value Ref Range Status   Enterococcus faecalis NOT DETECTED NOT DETECTED Final   Enterococcus Faecium NOT DETECTED NOT DETECTED Final   Listeria monocytogenes NOT DETECTED NOT DETECTED Final   Staphylococcus species NOT DETECTED NOT DETECTED Final   Staphylococcus aureus (BCID) NOT DETECTED NOT DETECTED Final   Staphylococcus epidermidis NOT DETECTED NOT DETECTED Final   Staphylococcus lugdunensis NOT DETECTED NOT DETECTED Final   Streptococcus species NOT DETECTED NOT DETECTED Final   Streptococcus agalactiae NOT DETECTED NOT DETECTED Final   Streptococcus pneumoniae NOT DETECTED NOT DETECTED Final   Streptococcus pyogenes NOT DETECTED NOT DETECTED Final   A.calcoaceticus-baumannii NOT DETECTED NOT DETECTED Final   Bacteroides fragilis NOT DETECTED NOT DETECTED Final   Enterobacterales DETECTED (A) NOT DETECTED Final    Comment: Enterobacterales represent a large order of gram negative bacteria, not a single organism. CRITICAL RESULT CALLED TO, READ BACK BY AND VERIFIED WITH: JASON ROBBINS @ 0322 03/14/23 LFD    Enterobacter cloacae complex NOT DETECTED NOT DETECTED Final   Escherichia coli DETECTED (A) NOT DETECTED Final    Comment: CRITICAL RESULT CALLED TO, READ BACK BY AND VERIFIED WITH: JASON ROBBINS @ 0322 03/14/23 LFD    Klebsiella aerogenes NOT DETECTED NOT DETECTED Final   Klebsiella oxytoca NOT DETECTED NOT DETECTED Final   Klebsiella pneumoniae NOT  DETECTED NOT DETECTED Final   Proteus species NOT DETECTED NOT DETECTED Final   Salmonella species NOT DETECTED NOT DETECTED Final   Serratia marcescens NOT DETECTED NOT DETECTED Final   Haemophilus influenzae NOT DETECTED NOT DETECTED Final   Neisseria meningitidis NOT DETECTED NOT DETECTED Final   Pseudomonas aeruginosa NOT DETECTED NOT DETECTED Final   Stenotrophomonas maltophilia NOT DETECTED NOT DETECTED Final   Candida albicans NOT DETECTED NOT DETECTED Final   Candida auris NOT DETECTED NOT DETECTED Final   Candida glabrata NOT DETECTED NOT DETECTED Final   Candida krusei NOT DETECTED NOT DETECTED Final   Candida parapsilosis NOT DETECTED NOT DETECTED Final   Candida tropicalis NOT DETECTED NOT DETECTED Final   Cryptococcus neoformans/gattii NOT DETECTED NOT DETECTED Final   CTX-M ESBL DETECTED (A) NOT DETECTED Final    Comment: CRITICAL RESULT CALLED TO, READ BACK BY AND VERIFIED WITH: JASON ROBBINS @ 0322 03/14/23 LFD (NOTE) Extended spectrum beta-lactamase detected. Recommend a carbapenem as initial therapy.      Carbapenem resistance IMP NOT DETECTED NOT DETECTED Final  Carbapenem resistance KPC NOT DETECTED NOT DETECTED Final   Carbapenem resistance NDM NOT DETECTED NOT DETECTED Final   Carbapenem resist OXA 48 LIKE NOT DETECTED NOT DETECTED Final   Carbapenem resistance VIM NOT DETECTED NOT DETECTED Final    Comment: Performed at [facility], [address] [place], [place] [address]  Culture, blood (Routine X 2) w Reflex to ID Panel     Status: None (Preliminary result)   Collection Time: 03/14/23 12:52 PM   Specimen: BLOOD  Result Value Ref Range Status   Specimen Description   Final    BLOOD BLOOD RIGHT HAND Performed at [facility], [address], [place], [place] [address]    Special Requests   Final    BOTTLES DRAWN AEROBIC AND ANAEROBIC Blood Culture adequate volume Performed at [facility], [address]  [place], [place] [address]    Culture  Setup Time   Final    GRAM NEGATIVE RODS AEROBIC BOTTLE ONLY CRITICAL RESULT CALLED TO, READ BACK BY AND VERIFIED WITH: JASON ROBBINS @ 0322 0/25/24 LFD GRAM STAIN REVIEWED-AGREE WITH RESULT    Culture   Final    GRAM NEGATIVE RODS IDENTIFICATION TO FOLLOW Performed at [facility], [address] [place], [place] [address]    Report Status PENDING  Incomplete    IMAGING RESULTS: MRI brain none acute I have personally reviewed the films ? Impression/Recommendation 72 yr male presented with syncope,and developed fever a few days later ESBL ecoli bacteremia Source not obvious Need to r/o UTI Bladder scan Urine culture Renal scan May need CT abdomen Continue meroenem- repeat blood culture? ? ? ___________________________________________________ Discussed with patient, requesting provider Note:  This document was prepared using Dragon voice recognition software and may include unintentional dictation errors.

## 2023-03-16 NOTE — Progress Notes (Signed)
[facility] Progress Note  Patient: [name]    AOZ:[id number]  DOA: 03/04/2023     Date of Service: the patient was seen and examined on 03/16/2023  Chief Complaint  Patient presents with   Fall   Head Injury   Brief hospital course: 72 year old male with past medical history significant for hypertension, bipolar disorder, COPD, schizophrenia and cognitive deficit, presented from group home to emergency department for evaluation of fall.    Patient sustained laceration that was repaired in the ED. Patient is admitted to hospitalist service for evaluation of syncope/dizziness. Patient had imaging studies including CT head, spine did not reveal any acute process.  Baseline cognitive deficits, he need supervision, high risk for fall, not safe to go home. TOC working with APS for [employer], legal guardian.   Assessment and Plan:   ESBL E. coli bacteremia, unknown source 8/25 blood culture growing ESBL E. Coli Follow sensitivity report Continue meropenem 1 g IV 3 times daily pharmacy consulted for dosing Follow UA and urine culture Bladder scan ruled out urinary retention Follow ID consult for antibiotics and duration of treatment Patient may need repeat blood culture, follow ID for further recommendation Follow renal sonogram  Syncope and collapse:  Unsteady gait and fall Positive for orthostatic vitals.  TTE without significant finding.  Has unsteady gait.  Received IV fluid hydration. TOC working with APS, need legal guardian, placement. -Continue fall precaution --repeat orthostatic BP when able --PT/OT rec SNF   Elevated TSH:  TSH elevated to 34, however, free T4 is wnl.  Pt has methimazole on PTA home med list. --Initially started on Synthroid 25 mcg daily which was later increased to 75 mcg daily on 8/23.   Plan: --if pt was taking methimazole for hyperthyroidism PTA, then instead of starting Synthroid for elevated TSH, can just hold methimazole for now. --d/c'd  Synthroid on 8/25 -Repeat TSH and free T4 in 4 to 6 weeks   Schizophrenia (HCC): Stable -Continue Celexa Prolixin Risperdal    COPD: Stable   Hyperlipidemia -cont home Zocor    Essential hypertension -hold home amlodipine and Lisinopril for now since BP is wnl to soft, and pt presented with orthostasis.   Isotonic hyponatremia, most likely due to poor oral intake Serum osmolality 292 --monitor Na  Body mass index is 26.21 kg/m.  Interventions:  Diet: Regular diet DVT Prophylaxis: Subcutaneous Lovenox   Advance goals of care discussion: Full code  Family Communication: family was not present at bedside, at the time of interview.  The pt provided permission to discuss medical plan with the family. Opportunity was given to ask question and all questions were answered satisfactorily.   Disposition:  Pt is from group home, admitted with syncope and collapse, found to have bacteremia ESBL E. coli, still on antibiotics. DC plan to rehab, APS involved. Follow TOC for discharge planning.  Subjective: No significant events overnight, patient was awake and alert, eating breakfast, denied any active issues.   Physical Exam: General: NAD, lying comfortably Appear in no distress, affect appropriate Eyes: PERRLA ENT: Oral Mucosa Clear, moist  Neck: no JVD,  Cardiovascular: S1 and S2 Present, no Murmur,  Respiratory: good respiratory effort, Bilateral Air entry equal and Decreased, no Crackles, no wheezes Abdomen: Bowel Sound present, Soft and no tenderness,  Skin: no rashes Extremities: no Pedal edema, no calf tenderness Neurologic: without any new focal findings Gait not checked due to patient safety concerns  Vitals:   03/15/23 1349 03/15/23 2132 03/16/23 0540 03/16/23 0809  BP: (!) 105/54 132/65 126/70 122/64  Pulse: 66 (!) 102 (!) 103 88  Resp: 16   18  Temp: 97.7 F (36.5 C)  99.1 F (37.3 C) 98.6 F (37 C)  TempSrc: Axillary  Oral Oral  SpO2: 95% 100% 93% 96%   Weight:      Height:        Intake/Output Summary (Last 24 hours) at 03/16/2023 1323 Last data filed at 03/16/2023 0800 Gross per 24 hour  Intake 300 ml  Output 225 ml  Net 75 ml   Filed Weights   03/13/23 0200 03/14/23 0311 03/15/23 0500  Weight: 80.9 kg 85.4 kg 80.5 kg    Data Reviewed: I have personally reviewed and interpreted daily labs, tele strips, imagings as discussed above. I reviewed all nursing notes, pharmacy notes, vitals, pertinent old records I have discussed plan of care as described above with RN and patient/family.  CBC: Recent Labs  Lab 03/14/23 0521 03/15/23 1042  WBC 7.3 8.3  NEUTROABS 5.2  --   HGB 13.3 13.2  HCT 40.2 40.2  MCV 90.3 90.3  PLT 143* 136*   Basic Metabolic Panel: Recent Labs  Lab 03/14/23 0521 03/15/23 0501 03/15/23 1042  NA 130* 131* 133*  K 4.2  --  4.3  CL 99  --  99  CO2 22  --  25  GLUCOSE 140*  --  112*  BUN 23  --  36*  CREATININE 0.96  --  0.93  CALCIUM 9.2  --  9.6  MG 2.4  --  2.4  PHOS 4.0  --  3.5    Studies: No results found.  Scheduled Meds:  citalopram  40 mg Oral Daily   docusate sodium  100 mg Oral BID   enoxaparin (LOVENOX) injection  40 mg Subcutaneous QPM   fluPHENAZine  5 mg Oral Daily   risperiDONE  1 mg Oral Daily   simvastatin  20 mg Oral Daily   Continuous Infusions:  meropenem (MERREM) IV 1 g (03/16/23 1009)   PRN Meds: acetaminophen **OR** acetaminophen, bisacodyl, hydrALAZINE, ondansetron **OR** ondansetron (ZOFRAN) IV, polyethylene glycol, sodium phosphate  Time spent: 35 minutes  Author: Gillis Santa. MD [facility] 03/16/2023 1:23 PM  To reach On-call, see care teams to locate the attending and reach out to them via [email or web address]. If 7PM-7AM, please contact night-coverage If you still have difficulty reaching the attending provider, please page the [facility] (Director on Call) for [facility] on amion for assistance.

## 2023-03-17 DIAGNOSIS — B9620 Unspecified Escherichia coli [E. coli] as the cause of diseases classified elsewhere: Secondary | ICD-10-CM | POA: Diagnosis not present

## 2023-03-17 DIAGNOSIS — R55 Syncope and collapse: Secondary | ICD-10-CM | POA: Diagnosis not present

## 2023-03-17 DIAGNOSIS — A498 Other bacterial infections of unspecified site: Secondary | ICD-10-CM

## 2023-03-17 DIAGNOSIS — Z1612 Extended spectrum beta lactamase (ESBL) resistance: Secondary | ICD-10-CM | POA: Diagnosis not present

## 2023-03-17 DIAGNOSIS — R509 Fever, unspecified: Secondary | ICD-10-CM | POA: Diagnosis not present

## 2023-03-17 DIAGNOSIS — S0101XA Laceration without foreign body of scalp, initial encounter: Secondary | ICD-10-CM

## 2023-03-17 DIAGNOSIS — W19XXXA Unspecified fall, initial encounter: Secondary | ICD-10-CM

## 2023-03-17 LAB — BASIC METABOLIC PANEL
Anion gap: 3 — ABNORMAL LOW (ref 5–15)
BUN: 19 mg/dL (ref 8–23)
CO2: 26 mmol/L (ref 22–32)
Calcium: 9 mg/dL (ref 8.9–10.3)
Chloride: 103 mmol/L (ref 98–111)
Creatinine, Ser: 0.78 mg/dL (ref 0.61–1.24)
GFR, Estimated: >60 mL/min (ref >60)
Glucose, Bld: 87 mg/dL (ref 70–99)
Potassium: 4.1 mmol/L (ref 3.5–5.1)
Sodium: 132 mmol/L — ABNORMAL LOW (ref 135–145)

## 2023-03-17 LAB — CULTURE, BLOOD (ROUTINE X 2)
Special Requests: ADEQUATE
Special Requests: ADEQUATE

## 2023-03-17 LAB — CBC
HCT: 36 % — ABNORMAL LOW (ref 39.0–52.0)
Hemoglobin: 12 g/dL — ABNORMAL LOW (ref 13.0–17.0)
MCH: 29.8 pg (ref 26.0–34.0)
MCHC: 33.3 g/dL (ref 30.0–36.0)
MCV: 89.3 fL (ref 80.0–100.0)
Platelets: 139 10*3/uL — ABNORMAL LOW (ref 150–400)
RBC: 4.03 MIL/uL — ABNORMAL LOW (ref 4.22–5.81)
RDW: 13.2 % (ref 11.5–15.5)
WBC: 6.4 10*3/uL (ref 4.0–10.5)
nRBC: 0 % (ref 0.0–0.2)

## 2023-03-17 LAB — GLUCOSE, CAPILLARY: Glucose-Capillary: 85 mg/dL (ref 70–99)

## 2023-03-17 MED ORDER — BISACODYL 5 MG PO TBEC
10.0000 mg | DELAYED_RELEASE_TABLET | Freq: Every day | ORAL | Status: DC
  Administered 2023-03-17 – 2023-04-06 (×16): 10 mg via ORAL
  Filled 2023-03-17 (×18): qty 2

## 2023-03-17 MED ORDER — BISACODYL 5 MG PO TBEC: 10.0000 mg | DELAYED_RELEASE_TABLET | Freq: Once | ORAL | Status: AC

## 2023-03-17 MED ORDER — BISACODYL 10 MG RE SUPP: 10.0000 mg | Freq: Every day | RECTAL | Status: DC | PRN

## 2023-03-17 MED ORDER — POLYETHYLENE GLYCOL 3350 17 G PO PACK
17.0000 g | PACK | Freq: Two times a day (BID) | ORAL | Status: DC
  Administered 2023-03-17 – 2023-04-07 (×28): 17 g via ORAL
  Filled 2023-03-17 (×32): qty 1

## 2023-03-17 NOTE — Progress Notes (Signed)
Pharmacy Antibiotic Note  [name] is a 72 y.o. male admitted on 03/04/2023 with a fall and associated head injury. PMH consists of HTN, bipolar disorder, COPD, schizophrenia, and cognitive deficit. Blood cultures resulted on 8/25 - bacteremia and ESBL infection . E Coli in 2 of 4 bottles on BCID, CTX-M resistance detected. Pharmacy has been consulted for meropenem dosing for ESBL E. coli bacteremia.   Plan: Day 3 of meropenem - continue 1 gram IV Q8H SCr 0.78 today with estimated CrCl 83.5 mL/min Pharmacy will continue to monitor and dose adjust appropriately   Height: 5\' 9"  (175.3 cm) Weight: 81.2 kg (179 lb 0.2 oz) IBW/kg (Calculated) : 70.7  Temp (24hrs), Avg:98.4 F (36.9 C), Min:98.2 F (36.8 C), Max:98.6 F (37 C)  Recent Labs  Lab 03/14/23 0521 03/15/23 1042  WBC 7.3 8.3  CREATININE 0.96 0.93    Estimated Creatinine Clearance: 71.8 mL/min (by C-G formula based on SCr of 0.93 mg/dL).    Allergies  Allergen Reactions   Asa [Aspirin]     Hx GI bleed   Antimicrobials this admission: Meropenem 8/25  >>   Dose adjustments this admission:N/A  Microbiology results: 8/25 BCx: 2/4 E. Coli - CTX-M resistance. ESBL 8/28 BCx: NG < 12h  Thank you for allowing pharmacy to be a part of this patient's care.  Littie Deeds, PharmD PGY1 Pharmacy Resident 03/17/2023 6:07 AM

## 2023-03-17 NOTE — Progress Notes (Signed)
Physical Therapy Treatment Patient Details Name: [name] MRN: [id number] DOB: 28-May-1951 Today's Date: 03/17/2023   History of Present Illness Patient is a 72 year old male from group home with unwitnessed fall with possible syncopal episode, striking his head. History of hypertension, hyperlipidemia, COPD, bipolar disorder, and schizophrenia. MRI of brain with no acute intracranial pathology    PT Comments  Patient received in bed, he is alert with flat affect. Patient requires mod A for supine to sit at edge of bed. Difficulty scooting. He requires +2 mod A to stand from low bed. Initially patient has posterior leaning in standing with RW. He is able to ambulate 15 feet with +2 assist and chair to follow. Patient ambulates with very slow cadence, small, shuffle steps. Patient will continue to benefit from skilled PT to improve strength and functional independence.        If plan is discharge home, recommend the following: A lot of help with walking and/or transfers;A lot of help with bathing/dressing/bathroom;Help with stairs or ramp for entrance;Assist for transportation;Direct supervision/assist for medications management   Can travel by private vehicle        Equipment Recommendations  None recommended by PT (TBD)    Recommendations for Other Services       Precautions / Restrictions Precautions Precautions: Fall Restrictions Weight Bearing Restrictions: No     Mobility  Bed Mobility Overal bed mobility: Needs Assistance Bed Mobility: Supine to Sit     Supine to sit: Mod assist, +2 for physical assistance, HOB elevated     General bed mobility comments: patient requires assist to scoot to edge of bed.    Transfers Overall transfer level: Needs assistance Equipment used: Rolling walker (2 wheels) Transfers: Sit to/from Stand Sit to Stand: Mod assist, +2 physical assistance                Ambulation/Gait Ambulation/Gait assistance: Min assist, +2  physical assistance, +2 safety/equipment Gait Distance (Feet): 15 Feet Assistive device: Rolling walker (2 wheels) Gait Pattern/deviations: Step-to pattern, Decreased step length - right, Decreased step length - left, Decreased stride length, Narrow base of support, Shuffle Gait velocity: Decreased     General Gait Details: slow ambulation with small steps to door. ~15 feet. patient requires min A to propel AD, slow to respond at times   Stairs             Wheelchair Mobility     Tilt Bed    Modified Rankin (Stroke Patients Only)       Balance Overall balance assessment: Needs assistance Sitting-balance support: Feet supported Sitting balance-Leahy Scale: Good Sitting balance - Comments: able to sit edge of bed without support   Standing balance support: Bilateral upper extremity supported, During functional activity Standing balance-Leahy Scale: Poor Standing balance comment: reliant on RW and physical assist. Posterior leaning in standing                            Cognition Arousal: Alert Behavior During Therapy: Flat affect Overall Cognitive Status: No family/caregiver present to determine baseline cognitive functioning                                 General Comments: Pt is alert but has slow processing. needs encouragement to participate in session and to talk during session.        Exercises  General Comments        Pertinent Vitals/Pain Pain Assessment Pain Assessment: Faces Faces Pain Scale: Hurts a little bit Pain Location: B feet    Home Living                          Prior Function            PT Goals (current goals can now be found in the care plan section) Acute Rehab PT Goals Patient Stated Goal: to return to group home PT Goal Formulation: With patient Time For Goal Achievement: 03/19/23 Potential to Achieve Goals: Fair Progress towards PT goals: Progressing toward goals     Frequency    Min 1X/week      PT Plan      Co-evaluation              AM-PAC PT "6 Clicks" Mobility   Outcome Measure  Help needed turning from your back to your side while in a flat bed without using bedrails?: None Help needed moving from lying on your back to sitting on the side of a flat bed without using bedrails?: A Lot Help needed moving to and from a bed to a chair (including a wheelchair)?: A Lot Help needed standing up from a chair using your arms (e.g., wheelchair or bedside chair)?: A Lot Help needed to walk in hospital room?: A Lot Help needed climbing 3-5 steps with a railing? : A Lot 6 Click Score: 14    End of Session Equipment Utilized During Treatment: Gait belt Activity Tolerance: Patient limited by fatigue Patient left: in chair;with call bell/phone within reach;with chair alarm set Nurse Communication: Mobility status PT Visit Diagnosis: Unsteadiness on feet (R26.81);Muscle weakness (generalized) (M62.81);Difficulty in walking, not elsewhere classified (R26.2)     Time: 1610-9604 PT Time Calculation (min) (ACUTE ONLY): 17 min  Charges:    $Gait Training: 8-22 mins PT General Charges $$ ACUTE PT VISIT: 1 Visit                     Kypton Eltringham, PT, GCS 03/17/23,12:45 PM

## 2023-03-17 NOTE — Progress Notes (Signed)
Date of Admission:  03/04/2023     ID: [name] is a 72 y.o. male  Principal Problem:   Syncope and collapse Active Problems:   Essential hypertension   GERD (gastroesophageal reflux disease)   Hyperlipidemia   COPD (chronic obstructive pulmonary disease) (HCC)   Schizophrenia (HCC)   Unsteady gait   Hypothyroidism   E coli bacteremia   Fall   Scalp laceration    Subjective: Sitting in chair More alert Says he is okay  Medications:   citalopram  40 mg Oral Daily   docusate sodium  100 mg Oral BID   enoxaparin (LOVENOX) injection  40 mg Subcutaneous QPM   fluPHENAZine  5 mg Oral Daily   risperiDONE  1 mg Oral Daily   simvastatin  20 mg Oral Daily    Objective: Vital signs in last 24 hours: Patient Vitals for the past 24 hrs:  BP Temp Temp src Pulse Resp SpO2 Weight  03/17/23 0810 133/65 98.6 F (37 C) -- 76 16 98 % --  03/17/23 0500 -- -- -- -- -- -- 81.2 kg  03/17/23 0446 132/63 98.4 F (36.9 C) Oral 71 16 97 % --  03/16/23 2114 -- -- -- -- -- 96 % --  03/16/23 1949 (!) 144/68 98.2 F (36.8 C) -- 78 16 (!) 86 % --  03/16/23 1723 (!) 119/57 98.4 F (36.9 C) -- 66 17 (!) 84 % --       PHYSICAL EXAM:  General: Alert, cooperative, no distress, appears stated age. Oriented X 3 Lungs: Clear to auscultation bilaterally. No Wheezing or Rhonchi. No rales. Heart: Regular rate and rhythm, no murmur, rub or gallop. Abdomen: Soft, non-tender,not distended. Bowel sounds normal. No masses Extremities: atraumatic, no cyanosis. No edema. No clubbing Skin: No rashes or lesions. Or bruising Lymph: Cervical, supraclavicular normal. Neurologic: Grossly non-focal  Lab Results    Latest Ref Rng & Units 03/17/2023    8:22 AM 03/15/2023   10:42 AM 03/14/2023    5:21 AM  CBC  WBC 4.0 - 10.5 K/uL 6.4  8.3  7.3   Hemoglobin 13.0 - 17.0 g/dL 40.3  47.4  25.9   Hematocrit 39.0 - 52.0 % 36.0  40.2  40.2   Platelets 150 - 400 K/uL 139  136  143        Latest Ref Rng &  Units 03/17/2023    8:22 AM 03/15/2023   10:42 AM 03/15/2023    5:01 AM  CMP  Glucose 70 - 99 mg/dL 87  563    BUN 8 - 23 mg/dL 19  36    Creatinine 8.75 - 1.24 mg/dL 6.43  3.29    Sodium 518 - 145 mmol/L 132  133  131   Potassium 3.5 - 5.1 mmol/L 4.1  4.3    Chloride 98 - 111 mmol/L 103  99    CO2 22 - 32 mmol/L 26  25    Calcium 8.9 - 10.3 mg/dL 9.0  9.6        Microbiology: 03/14/23 [facility]- ESBl ECOLI 02/2823 BC  Studies/Results: US RENAL  Result Date: 03/16/2023 CLINICAL DATA:  E coli bacteremia. EXAM: RENAL / URINARY TRACT ULTRASOUND COMPLETE COMPARISON:  None Available. FINDINGS: Right Kidney: Renal measurements: 11.0 x 4.6 x 6.2 cm = volume: 163.4 mL. No collecting system dilatation or perinephric fluid. There is an echogenic area identified with shadowing measuring 13 mm in the kidney consistent with a stone or calcification there is a near anechoic  structure with through transmission peripherally in the lower pole of the right kidney measuring 2.8 x 2.3 x 2.6 cm. Cyst. Left Kidney: Renal measurements: 12.0 x 5.9 x 6.7 cm = volume: 251.1 mL. Echogenicity within normal limits. No mass or hydronephrosis visualized. Bladder: Appears normal for degree of bladder distention. Other: If there is further concern of the sequela of infection to the kidneys, a contrast CT scan may be useful for higher sensitivity. IMPRESSION: No collecting system dilatation. Right-sided renal stone measuring 13 mm.  Lower pole cystic lesion. Electronically Signed   By: Karen Kays M.D.   On: 03/16/2023 19:52     Assessment/Plan: 72 yr male presented with syncope,and developed fever a few days later ESBL ecoli bacteremia Source not obvious Need to r/o UTI Bladder scan no residual urine Urine culture sent  Renal scan no hydro Will need CT abdomen On meropenem - will need for minimum 10 days- repeat blood culture?   Syncope ? Bipolar disorder   Discussed the management with care team

## 2023-03-17 NOTE — Plan of Care (Signed)
  Problem: Cardiac: Goal: Will achieve and/or maintain adequate cardiac output Outcome: Progressing   Problem: Education: Goal: Knowledge of General Education information will improve Description: Including pain rating scale, medication(s)/side effects and non-pharmacologic comfort measures Outcome: Progressing   Problem: Activity: Goal: Risk for activity intolerance will decrease Outcome: Progressing   Problem: Nutrition: Goal: Adequate nutrition will be maintained Outcome: Progressing   Problem: Coping: Goal: Level of anxiety will decrease Outcome: Progressing   Problem: Elimination: Goal: Will not experience complications related to bowel motility Outcome: Progressing Goal: Will not experience complications related to urinary retention Outcome: Progressing

## 2023-03-17 NOTE — Progress Notes (Signed)
[facility] Progress Note  Patient: [name]    ZOX:[id number]  DOA: 03/04/2023     Date of Service: the patient was seen and examined on 03/17/2023  Chief Complaint  Patient presents with   Fall   Head Injury   Brief hospital course: 72 year old male with past medical history significant for hypertension, bipolar disorder, COPD, schizophrenia and cognitive deficit, presented from group home to emergency department for evaluation of fall.    Patient sustained laceration that was repaired in the ED. Patient is admitted to hospitalist service for evaluation of syncope/dizziness. Patient had imaging studies including CT head, spine did not reveal any acute process.  Baseline cognitive deficits, he need supervision, high risk for fall, not safe to go home. TOC working with APS for [employer], legal guardian.   Assessment and Plan:   ESBL E. coli bacteremia, unknown source 8/25 blood culture growing ESBL E. Coli sensitive to imipenem Continue meropenem 1 g IV 3 times daily pharmacy consulted for dosing UA negative Bladder scan ruled out urinary retention 8/28 follow repeat blood cultures Follow ID consult for antibiotics and duration of treatment  Syncope and collapse:  Unsteady gait and fall Positive for orthostatic vitals.  TTE without significant finding.  Has unsteady gait.  Received IV fluid hydration. TOC working with APS, need legal guardian, placement. -Continue fall precaution --repeat orthostatic BP when able --PT/OT rec SNF   Elevated TSH:  TSH elevated to 34, however, free T4 is wnl.  Pt has methimazole on PTA home med list. --Initially started on Synthroid 25 mcg daily which was later increased to 75 mcg daily on 8/23.   Plan: --if pt was taking methimazole for hyperthyroidism PTA, then instead of starting Synthroid for elevated TSH, can just hold methimazole for now. --d/c'd Synthroid on 8/25 -Repeat TSH and free T4 in 4 to 6 weeks   Schizophrenia (HCC):  Stable -Continue Celexa Prolixin Risperdal    COPD: Stable   Hyperlipidemia -cont home Zocor    Essential hypertension -hold home amlodipine and Lisinopril for now since BP is wnl to soft, and pt presented with orthostasis.   Isotonic hyponatremia, most likely due to poor oral intake Serum osmolality 292 --monitor Na  Constipation, started laxatives.  Body mass index is 26.44 kg/m.  Interventions:  Diet: Regular diet DVT Prophylaxis: Subcutaneous Lovenox   Advance goals of care discussion: Full code  Family Communication: family was not present at bedside, at the time of interview.  The pt provided permission to discuss medical plan with the family. Opportunity was given to ask question and all questions were answered satisfactorily.   Disposition:  Pt is from group home, admitted with syncope and collapse, found to have bacteremia ESBL E. coli, still on antibiotics. DC plan to rehab, APS involved. Follow TOC for discharge planning.  Subjective: No significant events overnight, patient was laying comfortably in the bed, denied any active issues.   Physical Exam: General: NAD, lying comfortably Appear in no distress, affect appropriate Eyes: PERRLA ENT: Oral Mucosa Clear, moist  Neck: no JVD,  Cardiovascular: S1 and S2 Present, no Murmur,  Respiratory: good respiratory effort, Bilateral Air entry equal and Decreased, no Crackles, no wheezes Abdomen: Bowel Sound present, Soft and no tenderness,  Skin: no rashes Extremities: no Pedal edema, no calf tenderness Neurologic: without any new focal findings Gait not checked due to patient safety concerns  Vitals:   03/16/23 2114 03/17/23 0446 03/17/23 0500 03/17/23 0810  BP:  132/63  133/65  Pulse:  71  76  Resp:  16  16  Temp:  98.4 F (36.9 C)  98.6 F (37 C)  TempSrc:  Oral    SpO2: 96% 97%  98%  Weight:   81.2 kg   Height:        Intake/Output Summary (Last 24 hours) at 03/17/2023 1348 Last data filed at  03/17/2023 1013 Gross per 24 hour  Intake 370.39 ml  Output 483 ml  Net -112.61 ml   Filed Weights   03/14/23 0311 03/15/23 0500 03/17/23 0500  Weight: 85.4 kg 80.5 kg 81.2 kg    Data Reviewed: I have personally reviewed and interpreted daily labs, tele strips, imagings as discussed above. I reviewed all nursing notes, pharmacy notes, vitals, pertinent old records I have discussed plan of care as described above with RN and patient/family.  CBC: Recent Labs  Lab 03/14/23 0521 03/15/23 1042 03/17/23 0822  WBC 7.3 8.3 6.4  NEUTROABS 5.2  --   --   HGB 13.3 13.2 12.0*  HCT 40.2 40.2 36.0*  MCV 90.3 90.3 89.3  PLT 143* 136* 139*   Basic Metabolic Panel: Recent Labs  Lab 03/14/23 0521 03/15/23 0501 03/15/23 1042 03/17/23 0822  NA 130* 131* 133* 132*  K 4.2  --  4.3 4.1  CL 99  --  99 103  CO2 22  --  25 26  GLUCOSE 140*  --  112* 87  BUN 23  --  36* 19  CREATININE 0.96  --  0.93 0.78  CALCIUM 9.2  --  9.6 9.0  MG 2.4  --  2.4  --   PHOS 4.0  --  3.5  --     Studies: US RENAL  Result Date: 03/16/2023 CLINICAL DATA:  E coli bacteremia. EXAM: RENAL / URINARY TRACT ULTRASOUND COMPLETE COMPARISON:  None Available. FINDINGS: Right Kidney: Renal measurements: 11.0 x 4.6 x 6.2 cm = volume: 163.4 mL. No collecting system dilatation or perinephric fluid. There is an echogenic area identified with shadowing measuring 13 mm in the kidney consistent with a stone or calcification there is a near anechoic structure with through transmission peripherally in the lower pole of the right kidney measuring 2.8 x 2.3 x 2.6 cm. Cyst. Left Kidney: Renal measurements: 12.0 x 5.9 x 6.7 cm = volume: 251.1 mL. Echogenicity within normal limits. No mass or hydronephrosis visualized. Bladder: Appears normal for degree of bladder distention. Other: If there is further concern of the sequela of infection to the kidneys, a contrast CT scan may be useful for higher sensitivity. IMPRESSION: No collecting  system dilatation. Right-sided renal stone measuring 13 mm.  Lower pole cystic lesion. Electronically Signed   By: Karen Kays M.D.   On: 03/16/2023 19:52    Scheduled Meds:  bisacodyl  10 mg Oral QHS   bisacodyl  10 mg Oral Once   citalopram  40 mg Oral Daily   docusate sodium  100 mg Oral BID   enoxaparin (LOVENOX) injection  40 mg Subcutaneous QPM   fluPHENAZine  5 mg Oral Daily   polyethylene glycol  17 g Oral BID   risperiDONE  1 mg Oral Daily   simvastatin  20 mg Oral Daily   Continuous Infusions:  meropenem (MERREM) IV 1 g (03/17/23 0556)   PRN Meds: acetaminophen **OR** acetaminophen, bisacodyl, hydrALAZINE, ondansetron **OR** ondansetron (ZOFRAN) IV, sodium phosphate  Time spent: 35 minutes  Author: Gillis Santa. MD [facility] 03/17/2023 1:48 PM  To reach On-call, see care teams to  locate the attending and reach out to them via [email or web address]. If 7PM-7AM, please contact night-coverage If you still have difficulty reaching the attending provider, please page the [facility] (Director on Call) for [facility] on amion for assistance.

## 2023-03-18 ENCOUNTER — Inpatient Hospital Stay: Payer: Medicare HMO

## 2023-03-18 DIAGNOSIS — R55 Syncope and collapse: Secondary | ICD-10-CM | POA: Diagnosis not present

## 2023-03-18 LAB — GLUCOSE, CAPILLARY: Glucose-Capillary: 84 mg/dL (ref 70–99)

## 2023-03-18 LAB — URINE CULTURE: Culture: NO GROWTH

## 2023-03-18 MED ORDER — IOHEXOL 300 MG/ML  SOLN
100.0000 mL | Freq: Once | INTRAMUSCULAR | Status: AC | PRN
  Administered 2023-03-18: 100 mL via INTRAVENOUS

## 2023-03-18 MED ORDER — IOHEXOL 240 MG/ML SOLN
500.0000 mL | INTRAMUSCULAR | Status: AC
  Administered 2023-03-18: 500 mL via ORAL

## 2023-03-18 MED ORDER — SODIUM CHLORIDE 0.9 % IV SOLN
1.0000 g | INTRAVENOUS | Status: DC
  Administered 2023-03-19 – 2023-03-23 (×5): 1 g via INTRAVENOUS
  Filled 2023-03-18: qty 1
  Filled 2023-03-18 (×4): qty 1000

## 2023-03-18 NOTE — Progress Notes (Signed)
[facility] Progress Note  Patient: [name]    ZOX:[id number]  DOA: 03/04/2023     Date of Service: the patient was seen and examined on 03/18/2023  Chief Complaint  Patient presents with   Fall   Head Injury   Brief hospital course: 72 year old male with past medical history significant for hypertension, bipolar disorder, COPD, schizophrenia and cognitive deficit, presented from group home to emergency department for evaluation of fall.    Patient sustained laceration that was repaired in the ED. Patient is admitted to hospitalist service for evaluation of syncope/dizziness. Patient had imaging studies including CT head, spine did not reveal any acute process.  Baseline cognitive deficits, he need supervision, high risk for fall, not safe to go home. TOC working with APS for [employer], legal guardian.   Assessment and Plan:   ESBL E. coli bacteremia, unknown source 8/25 blood culture growing ESBL E. Coli sensitive to imipenem Continue meropenem 1 g IV 3 times daily for 10 days till 03/24/23 pharmacy consulted for dosing UA negative Bladder scan ruled out urinary retention 8/28 repeat blood cultures NGTD ID consulted, recommended 10 days IV antibiotics.   Syncope and collapse:  Unsteady gait and fall Positive for orthostatic vitals.  TTE without significant finding.  Has unsteady gait.  Received IV fluid hydration. TOC working with APS, need legal guardian, placement. -Continue fall precaution --repeat orthostatic BP when able --PT/OT rec SNF   Elevated TSH:  TSH elevated to 34, however, free T4 is wnl.  Pt has methimazole on PTA home med list. --Initially started on Synthroid 25 mcg daily which was later increased to 75 mcg daily on 8/23.   Plan: --if pt was taking methimazole for hyperthyroidism PTA, then instead of starting Synthroid for elevated TSH, can just hold methimazole for now. --d/c'd Synthroid on 8/25 -Repeat TSH and free T4 in 4 to 6 weeks   Schizophrenia  (HCC): Stable -Continue Celexa Prolixin Risperdal  8/29 psych was consulted again to eval for capacity as per APS request  COPD: Stable   Hyperlipidemia -cont home Zocor    Essential hypertension -hold home amlodipine and Lisinopril for now since BP is wnl to soft, and pt presented with orthostasis.   Isotonic hyponatremia, most likely due to poor oral intake Serum osmolality 292 --monitor Na  Constipation, started laxatives.  Body mass index is 26.24 kg/m.  Interventions:  Diet: Regular diet DVT Prophylaxis: Subcutaneous Lovenox   Advance goals of care discussion: Full code  Family Communication: family was not present at bedside, at the time of interview.  The pt provided permission to discuss medical plan with the family. Opportunity was given to ask question and all questions were answered satisfactorily.   Disposition:  Pt is from group home, admitted with syncope and collapse, found to have bacteremia ESBL E. coli, still on antibiotics. DC plan to rehab, APS involved. Follow TOC for discharge planning.  Subjective: No significant events overnight, patient was able to takes some steps with the walker and was sitting on the bedside recliner.  Patient did move bowels.  Denied any complaints.  Physical Exam: General: NAD, lying comfortably Appear in no distress, affect appropriate Eyes: PERRLA ENT: Oral Mucosa Clear, moist  Neck: no JVD,  Cardiovascular: S1 and S2 Present, no Murmur,  Respiratory: good respiratory effort, Bilateral Air entry equal and Decreased, no Crackles, no wheezes Abdomen: Bowel Sound present, Soft and no tenderness,  Skin: no rashes Extremities: no Pedal edema, no calf tenderness Neurologic: without any new focal  findings Gait not checked due to patient safety concerns  Vitals:   03/17/23 2034 03/18/23 0458 03/18/23 0500 03/18/23 0740  BP: (!) 142/67 114/65  121/74  Pulse: 87 76  67  Resp: 18 19  18   Temp: 98.4 F (36.9 C) 98.7 F  (37.1 C)  97.8 F (36.6 C)  TempSrc:    Oral  SpO2: 95% 95%  95%  Weight:   80.6 kg   Height:        Intake/Output Summary (Last 24 hours) at 03/18/2023 1347 Last data filed at 03/18/2023 1610 Gross per 24 hour  Intake 240 ml  Output 1200 ml  Net -960 ml   Filed Weights   03/15/23 0500 03/17/23 0500 03/18/23 0500  Weight: 80.5 kg 81.2 kg 80.6 kg    Data Reviewed: I have personally reviewed and interpreted daily labs, tele strips, imagings as discussed above. I reviewed all nursing notes, pharmacy notes, vitals, pertinent old records I have discussed plan of care as described above with RN and patient/family.  CBC: Recent Labs  Lab 03/14/23 0521 03/15/23 1042 03/17/23 0822  WBC 7.3 8.3 6.4  NEUTROABS 5.2  --   --   HGB 13.3 13.2 12.0*  HCT 40.2 40.2 36.0*  MCV 90.3 90.3 89.3  PLT 143* 136* 139*   Basic Metabolic Panel: Recent Labs  Lab 03/14/23 0521 03/15/23 0501 03/15/23 1042 03/17/23 0822  NA 130* 131* 133* 132*  K 4.2  --  4.3 4.1  CL 99  --  99 103  CO2 22  --  25 26  GLUCOSE 140*  --  112* 87  BUN 23  --  36* 19  CREATININE 0.96  --  0.93 0.78  CALCIUM 9.2  --  9.6 9.0  MG 2.4  --  2.4  --   PHOS 4.0  --  3.5  --     Studies: No results found.  Scheduled Meds:  bisacodyl  10 mg Oral QHS   citalopram  40 mg Oral Daily   docusate sodium  100 mg Oral BID   enoxaparin (LOVENOX) injection  40 mg Subcutaneous QPM   fluPHENAZine  5 mg Oral Daily   polyethylene glycol  17 g Oral BID   risperiDONE  1 mg Oral Daily   simvastatin  20 mg Oral Daily   Continuous Infusions:  meropenem (MERREM) IV 1 g (03/18/23 0600)   PRN Meds: acetaminophen **OR** acetaminophen, bisacodyl, hydrALAZINE, ondansetron **OR** ondansetron (ZOFRAN) IV, sodium phosphate  Time spent: 35 minutes  Author: Gillis Santa. MD [facility] 03/18/2023 1:47 PM  To reach On-call, see care teams to locate the attending and reach out to them via [email or web address]. If 7PM-7AM, please  contact night-coverage If you still have difficulty reaching the attending provider, please page the [facility] (Director on Call) for [facility] on amion for assistance.

## 2023-03-18 NOTE — Progress Notes (Signed)
Physical Therapy Treatment Patient Details Name: [name] MRN: [id number] DOB: 09-10-1950 Today's Date: 03/18/2023   History of Present Illness Patient is a 72 year old male from group home with unwitnessed fall with possible syncopal episode, striking his head. History of hypertension, hyperlipidemia, COPD, bipolar disorder, and schizophrenia. MRI of brain with no acute intracranial pathology    PT Comments  Pt was long sitting in bed alert but non verbal. He nods head yes/no appropriately but needs max encouragement to talk. He continues to require constant assistance to exit bed, stand, and ambulate short distances. Author assisted pt with breakfast tray set up but pt unwilling to eat. Acute PT will continue to follow and progress per current POC.    If plan is discharge home, recommend the following: A lot of help with walking and/or transfers;A lot of help with bathing/dressing/bathroom;Help with stairs or ramp for entrance;Assist for transportation;Direct supervision/assist for medications management     Equipment Recommendations  None recommended by PT       Precautions / Restrictions Precautions Precautions: Fall Restrictions Weight Bearing Restrictions: No     Mobility  Bed Mobility Overal bed mobility: Needs Assistance Bed Mobility: Supine to Sit  Supine to sit: Mod assist, HOB elevated, Used rails   Transfers Overall transfer level: Needs assistance Equipment used: Rolling walker (2 wheels) Transfers: Sit to/from Stand Sit to Stand: Mod assist  General transfer comment: mod assist + vcs for fwd wt shif6t to stand form lowest bed height to RW    Ambulation/Gait Ambulation/Gait assistance: Min assist Gait Distance (Feet): 25 Feet Assistive device: Rolling walker (2 wheels) Gait Pattern/deviations: Step-to pattern, Decreased step length - right, Decreased step length - left, Decreased stride length, Narrow base of support, Shuffle Gait velocity: Decreased   General Gait Details: pt continues to have slow gait with constant min assist to propel RW fwd   Balance Overall balance assessment: Needs assistance Sitting-balance support: Feet supported Sitting balance-Leahy Scale: Good     Standing balance support: Bilateral upper extremity supported, During functional activity Standing balance-Leahy Scale: Poor       Cognition Arousal: Alert Behavior During Therapy: Flat affect Overall Cognitive Status: History of cognitive impairments - at baseline    General Comments: Pt is alert but has slow processing. needs encouragement to participate in session and to talk during session.               Pertinent Vitals/Pain Pain Assessment Pain Assessment: No/denies pain Faces Pain Scale: No hurt     PT Goals (current goals can now be found in the care plan section) Acute Rehab PT Goals Patient Stated Goal: pt mostly non verbal today. Does appropriately nod yes/no but even with enouragement, pt did not talk much. much more vocal previously session with author Progress towards PT goals: Progressing toward goals    Frequency    Min 1X/week       AM-PAC PT "6 Clicks" Mobility   Outcome Measure  Help needed turning from your back to your side while in a flat bed without using bedrails?: None Help needed moving from lying on your back to sitting on the side of a flat bed without using bedrails?: A Lot Help needed moving to and from a bed to a chair (including a wheelchair)?: A Lot Help needed standing up from a chair using your arms (e.g., wheelchair or bedside chair)?: A Lot Help needed to walk in hospital room?: A Lot Help needed climbing 3-5 steps with a railing? :  A Lot 6 Click Score: 14    End of Session   Activity Tolerance: Patient tolerated treatment well Patient left: in chair;with call bell/phone within reach;with chair alarm set Nurse Communication: Mobility status PT Visit Diagnosis: Unsteadiness on feet (R26.81);Muscle  weakness (generalized) (M62.81);Difficulty in walking, not elsewhere classified (R26.2)     Time: 4540-9811 PT Time Calculation (min) (ACUTE ONLY): 19 min  Charges:    $Gait Training: 8-22 mins PT General Charges $$ ACUTE PT VISIT: 1 Visit                     Jetta Lout PTA 03/18/23, 10:32 AM

## 2023-03-18 NOTE — TOC Progression Note (Signed)
Transition of Care [facility]) - Progression Note    Patient Details  Name: Romanus Osada MRN: [id number] Date of Birth: 1951-01-31  [facility]) CM/SW Contact  Garret Reddish, RN Phone Number: 03/18/2023, 11:10 AM  Clinical Narrative:     Chart reviewed.  I have spoken with DSS worker Lakeeta Sifforb.  Her contact number is [phone].  Mrs. Sifforb has requested capacity evaluation.  Mrs. Sifforb reports that patient does have 2 daughters but they are estranged.  Prior to DSS taking over guardianship for patient DSS would reach out to daughters to make sure they did not want to assist with decision making for their father.  I have asked Dr. Lucianne Muss to order Capacity evaluation.   TOC will continue to follow for discharge planning.          Expected Discharge Plan and Services                                               Social Determinants of Health (SDOH) Interventions SDOH Screenings   Food Insecurity: Patient Unable To Answer (03/05/2023)  Housing: Patient Unable To Answer (03/05/2023)  Transportation Needs: Patient Unable To Answer (03/05/2023)  Utilities: Patient Unable To Answer (03/05/2023)  Depression (PHQ2-9): Low Risk  (12/12/2020)  Tobacco Use: High Risk (03/05/2023)    Readmission Risk Interventions     No data to display

## 2023-03-18 NOTE — Consult Note (Signed)
Psychiatry consulted for "Reevaluation for capacity requested by APS". Upon approach, patient observed sleeping soundly with rise and fall of chest observed, indicating normal respiration. Psychiatry will re-attempt to evaluate when patient is awake and able to participate in assessment.    Fiona Coto H. Willeen Cass, NP 03/18/2023 1354

## 2023-03-18 NOTE — Plan of Care (Signed)
  Problem: Cardiac: Goal: Will achieve and/or maintain adequate cardiac output Outcome: Progressing   Problem: Education: Goal: Knowledge of General Education information will improve Description: Including pain rating scale, medication(s)/side effects and non-pharmacologic comfort measures Outcome: Progressing   Problem: Activity: Goal: Risk for activity intolerance will decrease Outcome: Progressing   Problem: Nutrition: Goal: Adequate nutrition will be maintained Outcome: Progressing   Problem: Coping: Goal: Level of anxiety will decrease Outcome: Progressing   Problem: Elimination: Goal: Will not experience complications related to bowel motility Outcome: Progressing Goal: Will not experience complications related to urinary retention Outcome: Progressing   Problem: Safety: Goal: Ability to remain free from injury will improve Outcome: Progressing

## 2023-03-18 NOTE — Progress Notes (Signed)
Pharmacy Antibiotic Note  [name] is a 72 y.o. male admitted on 03/04/2023 with a fall and associated head injury. PMH consists of HTN, bipolar disorder, COPD, schizophrenia, and cognitive deficit. Blood cultures resulted on 8/25 - bacteremia and ESBL infection . E Coli in 2 of 4 bottles on BCID, CTX-M resistance detected. Pharmacy has been consulted for meropenem dosing for ESBL E. coli bacteremia.   Plan: Day 4 of meropenem - continue 1 gram IV Q8H Renal function stable - pharmacy will continue to monitor and dose adjust appropriately   Height: 5\' 9"  (175.3 cm) Weight: 80.6 kg (177 lb 11.1 oz) IBW/kg (Calculated) : 70.7  Temp (24hrs), Avg:98.7 F (37.1 C), Min:98.4 F (36.9 C), Max:99.1 F (37.3 C)  Recent Labs  Lab 03/14/23 0521 03/15/23 1042 03/17/23 0822  WBC 7.3 8.3 6.4  CREATININE 0.96 0.93 0.78    Estimated Creatinine Clearance: 83.5 mL/min (by C-G formula based on SCr of 0.78 mg/dL).    Allergies  Allergen Reactions   Asa [Aspirin]     Hx GI bleed   Antimicrobials this admission: Meropenem 8/25  >>   Dose adjustments this admission:N/A  Microbiology results: 8/25 BCx: 2/4 E. Coli - CTX-M resistance. ESBL 8/27 UCx: pending 8/28 BCx: NG < 12h  Thank you for allowing pharmacy to be a part of this patient's care.  Littie Deeds, PharmD PGY1 Pharmacy Resident 03/18/2023 6:23 AM

## 2023-03-19 DIAGNOSIS — R55 Syncope and collapse: Secondary | ICD-10-CM | POA: Diagnosis not present

## 2023-03-19 DIAGNOSIS — B9620 Unspecified Escherichia coli [E. coli] as the cause of diseases classified elsewhere: Secondary | ICD-10-CM | POA: Diagnosis not present

## 2023-03-19 DIAGNOSIS — R509 Fever, unspecified: Secondary | ICD-10-CM | POA: Diagnosis not present

## 2023-03-19 DIAGNOSIS — Z1612 Extended spectrum beta lactamase (ESBL) resistance: Secondary | ICD-10-CM | POA: Diagnosis not present

## 2023-03-19 LAB — GLUCOSE, CAPILLARY
Glucose-Capillary: 73 mg/dL (ref 70–99)
Glucose-Capillary: 83 mg/dL (ref 70–99)
Glucose-Capillary: 88 mg/dL (ref 70–99)
Glucose-Capillary: 114 mg/dL — ABNORMAL HIGH (ref 70–99)

## 2023-03-19 LAB — BASIC METABOLIC PANEL
Anion gap: 9 (ref 5–15)
BUN: 12 mg/dL (ref 8–23)
CO2: 25 mmol/L (ref 22–32)
Calcium: 9.1 mg/dL (ref 8.9–10.3)
Chloride: 101 mmol/L (ref 98–111)
Creatinine, Ser: 0.76 mg/dL (ref 0.61–1.24)
GFR, Estimated: >60 mL/min (ref >60)
Glucose, Bld: 80 mg/dL (ref 70–99)
Potassium: 4.1 mmol/L (ref 3.5–5.1)
Sodium: 135 mmol/L (ref 135–145)

## 2023-03-19 LAB — CBC
HCT: 34.4 % — ABNORMAL LOW (ref 39.0–52.0)
Hemoglobin: 11.5 g/dL — ABNORMAL LOW (ref 13.0–17.0)
MCH: 29.9 pg (ref 26.0–34.0)
MCHC: 33.4 g/dL (ref 30.0–36.0)
MCV: 89.6 fL (ref 80.0–100.0)
Platelets: 170 10*3/uL (ref 150–400)
RBC: 3.84 MIL/uL — ABNORMAL LOW (ref 4.22–5.81)
RDW: 13.1 % (ref 11.5–15.5)
WBC: 7.3 10*3/uL (ref 4.0–10.5)
nRBC: 0 % (ref 0.0–0.2)

## 2023-03-19 NOTE — Plan of Care (Signed)

## 2023-03-19 NOTE — Progress Notes (Signed)
Mobility Specialist - Progress Note   03/19/23 1206  Mobility  Activity Ambulated with assistance in room;Stood at bedside;Transferred from bed to chair  Level of Assistance Minimal assist, patient does 75% or more  Assistive Device Front wheel walker  Distance Ambulated (ft) 20 ft  Activity Response Tolerated well  $Mobility charge 1 Mobility     Pt lying in bed upon arrival, utilizing RA. Pt agreeable to activity. Completed bed mobility, STS, and ambulation with minA. Slow, shuffling gait with mild initial post lean. Intermittent assist to navigate RW, especially during turns. Pt left in chair with alarm set, needs in reach.    Filiberto Pinks Mobility Specialist 03/19/23, 12:09 PM

## 2023-03-19 NOTE — Progress Notes (Signed)
Date of Admission:  03/04/2023     ID: [name] is a 72 y.o. male  Principal Problem:   Syncope and collapse Active Problems:   Essential hypertension   GERD (gastroesophageal reflux disease)   Hyperlipidemia   COPD (chronic obstructive pulmonary disease) (HCC)   Schizophrenia (HCC)   Unsteady gait   Hypothyroidism   E coli bacteremia   Fall   Scalp laceration   Infection due to ESBL-producing Escherichia coli    Subjective: Sitting in chair More alert Says he is okay  Medications:   bisacodyl  10 mg Oral QHS   citalopram  40 mg Oral Daily   docusate sodium  100 mg Oral BID   enoxaparin (LOVENOX) injection  40 mg Subcutaneous QPM   fluPHENAZine  5 mg Oral Daily   polyethylene glycol  17 g Oral BID   risperiDONE  1 mg Oral Daily   simvastatin  20 mg Oral Daily    Objective: Vital signs in last 24 hours: Patient Vitals for the past 24 hrs:  BP Temp Temp src Pulse Resp SpO2 Weight  03/19/23 1603 132/70 98.6 F (37 C) -- 77 18 100 % --  03/19/23 0900 124/78 98.4 F (36.9 C) Oral 88 16 98 % --  03/19/23 0526 137/73 98 F (36.7 C) Oral 80 19 96 % --  03/19/23 0500 -- -- -- -- -- -- 82.7 kg  03/19/23 0100 -- 98.4 F (36.9 C) -- -- -- -- --  03/18/23 2106 (!) 144/67 -- -- 85 18 98 % --       PHYSICAL EXAM:  General: Alert, cooperative, no distress, appears stated age. Oriented X 3 Lungs: Clear to auscultation bilaterally. No Wheezing or Rhonchi. No rales. Heart: Regular rate and rhythm, no murmur, rub or gallop. Abdomen: Soft, non-tender,not distended. Bowel sounds normal. No masses Extremities: atraumatic, no cyanosis. No edema. No clubbing Skin: No rashes or lesions. Or bruising Lymph: Cervical, supraclavicular normal. Neurologic: Grossly non-focal  Lab Results    Latest Ref Rng & Units 03/19/2023    8:23 AM 03/17/2023    8:22 AM 03/15/2023   10:42 AM  CBC  WBC 4.0 - 10.5 K/uL 7.3  6.4  8.3   Hemoglobin 13.0 - 17.0 g/dL 96.0  45.4  09.8    Hematocrit 39.0 - 52.0 % 34.4  36.0  40.2   Platelets 150 - 400 K/uL 170  139  136        Latest Ref Rng & Units 03/19/2023    8:23 AM 03/17/2023    8:22 AM 03/15/2023   10:42 AM  CMP  Glucose 70 - 99 mg/dL 80  87  119   BUN 8 - 23 mg/dL 12  19  36   Creatinine 0.61 - 1.24 mg/dL 1.47  8.29  5.62   Sodium 135 - 145 mmol/L 135  132  133   Potassium 3.5 - 5.1 mmol/L 4.1  4.1  4.3   Chloride 98 - 111 mmol/L 101  103  99   CO2 22 - 32 mmol/L 25  26  25    Calcium 8.9 - 10.3 mg/dL 9.1  9.0  9.6       Microbiology: 03/14/23 [facility]- ESBl ECOLI 02/2823 BC  Studies/Results: CT ABDOMEN PELVIS W CONTRAST  Result Date: 03/19/2023 CLINICAL DATA:  Sepsis. EXAM: CT ABDOMEN AND PELVIS WITH CONTRAST TECHNIQUE: Multidetector CT imaging of the abdomen and pelvis was performed using the standard protocol following bolus administration of intravenous contrast. RADIATION  DOSE REDUCTION: This exam was performed according to the departmental dose-optimization program which includes automated exposure control, adjustment of the mA and/or kV according to patient size and/or use of iterative reconstruction technique. CONTRAST:  OMNIPAQUE IOHEXOL 300 MG/ML  SOLN COMPARISON:  None Available. FINDINGS: Lower chest: Patchy left lung base consolidation may represent atelectasis or scarring. Pneumonia is not excluded clinical correlation is recommended. There is coronary vascular calcification. No intra-abdominal free air or free fluid. Hepatobiliary: The liver is unremarkable. No biliary ductal dilatation. The gallbladder is unremarkable. Pancreas: Unremarkable. No pancreatic ductal dilatation or surrounding inflammatory changes. Spleen: Normal in size without focal abnormality. Adrenals/Urinary Tract: The adrenal glands unremarkable. There is a 2.1 x 2.5 cm complex hypodense lesion in the inferior pole of the right kidney with apparent areas of enhancement as well as small areas of calcification concerning for neoplasm.  Further characterization with MRI without and with contrast recommended. A 2 cm cyst in the inferior pole of the right kidney more superiorly noted. There is a focal area of parenchymal calcification in the lateral interpolar right kidney. A 2 mm nonobstructing left renal inferior pole calculus versus parenchymal calcification. There is no hydronephrosis on either side. Slight heterogeneous enhancement of the right renal parenchyma. Correlation with urinalysis recommended to exclude pyelonephritis. The visualized ureters and urinary bladder probable. Stomach/Bowel: There is moderate stool throughout the colon. There is no bowel obstruction or active inflammation. The appendix is normal. Vascular/Lymphatic: Advanced aortoiliac atherosclerotic disease. There is a 3 cm infrarenal abdominal aortic aneurysm. The IVC is unremarkable. No portal venous gas. There is no adenopathy. Reproductive: The prostate and seminal vesicles are grossly unremarkable. No pelvic mass. Other: None Musculoskeletal: No acute or significant osseous findings. IMPRESSION: 1. A 2.1 x 2.5 cm complex hypodense lesion in the inferior pole of the right kidney concerning for neoplasm. Further characterization with MRI without and with contrast recommended. 2. Slight heterogeneous enhancement of the right renal parenchyma. Correlation with urinalysis recommended to evaluate for pyelonephritis. 3. No bowel obstruction. Normal appendix. 4. Patchy left lung base consolidation may represent atelectasis or scarring. Pneumonia is not excluded clinical correlation is recommended. 5. A 3 cm infrarenal abdominal aortic aneurysm. Recommend follow-up ultrasound every 3 years. This recommendation follows ACR consensus guidelines: White Paper of the ACR Incidental Findings Committee II on Vascular Findings. J Am Coll Radiol 2013; 10:789-794. 6.  Aortic Atherosclerosis (ICD10-I70.0). Electronically Signed   By: Elgie Collard M.D.   On: 03/19/2023 02:10      Assessment/Plan: 72 yr male presented with syncope,and developed fever a few days later ESBL ecoli bacteremia Likley rt pyelonephritis  Bladder scan no residual urine Urine culture NG 9 but after 48 hrs of antibiotic) Renal scan no hydro repeat blood culture neg ?CT abdomen shows complex lesion rt kidney concerning for neoplasm On ertapenem - will need for minimum 10 days-until 03/24/23    Syncope ? Bipolar disorder   Discussed the management with care team  ID will follow him peripherally this weekend [facility] on call this weekend- available by phone for urgent issue- call if needed

## 2023-03-19 NOTE — Treatment Plan (Signed)
Diagnosis: ESBL ecoli bacteremia Possible pyelonephritis Baseline Creatinine  < 0.76    Allergies  Allergen Reactions   Asa [Aspirin]     Hx GI bleed    OPAT Orders Discharge antibiotics: Ertapenem 1 gram IV every 24 hrs  Duration: 10 days End Date:03/24/23   PIC Care Per Protocol:  Labs weekly while on IV antibiotics: X__ CBC with differential  _X_ CMP   _X_ Please pull PIC at completion of IV antibiotics   Fax weekly lab results  promptly to [phone]  Clinic Follow Up Appt: not needed with ID   Call [phone] with any questions

## 2023-03-19 NOTE — Progress Notes (Signed)
[facility] Progress Note  Patient: [name]    EVO:[id number]  DOA: 03/04/2023     Date of Service: the patient was seen and examined on 03/19/2023  Chief Complaint  Patient presents with   Fall   Head Injury   Brief hospital course: 72 year old male with past medical history significant for hypertension, bipolar disorder, COPD, schizophrenia and cognitive deficit, presented from group home to emergency department for evaluation of fall.    Patient sustained laceration that was repaired in the ED. Patient is admitted to hospitalist service for evaluation of syncope/dizziness. Patient had imaging studies including CT head, spine did not reveal any acute process.  Baseline cognitive deficits, he need supervision, high risk for fall, not safe to go home. TOC working with APS for [employer], legal guardian.   Assessment and Plan:   ESBL E. coli bacteremia, unknown source 8/25 blood culture growing ESBL E. Coli sensitive to imipenem Continue meropenem 1 g IV 3 times daily for 10 days till 03/24/23 pharmacy consulted for dosing UA negative Bladder scan ruled out urinary retention 8/28 repeat blood cultures NGTD ID consulted, recommended 10 days IV antibiotics. CT A/P: Right perinephric stranding possible pyelonephritis, right renal mass, recommended MRI.  3 cm infrarenal abdominal aortic aneurysm.   Syncope and collapse:  Unsteady gait and fall Positive for orthostatic vitals.  TTE without significant finding.  Has unsteady gait.  Received IV fluid hydration. TOC working with APS, need legal guardian, placement. -Continue fall precaution --repeat orthostatic BP when able --PT/OT rec SNF   Elevated TSH:  TSH elevated to 34, however, free T4 is wnl.  Pt has methimazole on PTA home med list. --Initially started on Synthroid 25 mcg daily which was later increased to 75 mcg daily on 8/23.   Plan: --if pt was taking methimazole for hyperthyroidism PTA, then instead of starting  Synthroid for elevated TSH, can just hold methimazole for now. --d/c'd Synthroid on 8/25 -Repeat TSH and free T4 in 4 to 6 weeks   Schizophrenia (HCC): Stable -Continue Celexa Prolixin Risperdal  8/29 psych was consulted again to eval for capacity as per APS request  COPD: Stable   Hyperlipidemia -cont home Zocor    Essential hypertension -hold home amlodipine and Lisinopril for now since BP is wnl to soft, and pt presented with orthostasis.   Isotonic hyponatremia, most likely due to poor oral intake Serum osmolality 292 --monitor Na  Constipation, started laxatives.  Right renal mass CT a/p: A 2.1 x 2.5 cm complex hypodense lesion in the inferior pole of the right kidney concerning for neoplasm. Further characterization with MRI without and with contrast recommended. 8/30 d/w Urologist Dr Alvester Morin, [name], rec MRI can be done as an outpatient, not urgent Follow urologist as an outpatient  Abdominal aortic aneurysm, incidental finding on CT scan A 3 cm infrarenal abdominal aortic aneurysm. Recommend follow-up ultrasound every 3 years.  Recommend to follow with PCP for surveillance as an outpatient  Body mass index is 26.92 kg/m.  Interventions:  Diet: Regular diet DVT Prophylaxis: Subcutaneous Lovenox   Advance goals of care discussion: Full code  Family Communication: family was not present at bedside, at the time of interview.  The pt provided permission to discuss medical plan with the family. Opportunity was given to ask question and all questions were answered satisfactorily.   Disposition:  Pt is from group home, admitted with syncope and collapse, found to have bacteremia ESBL E. coli, still on antibiotics. DC plan to rehab, APS involved. Follow  TOC for discharge planning.  Subjective: No significant events overnight, patient was laying comfortably, denied any dysuria, no abdominal pain.  Denied any chest pain or palpitation, no shortness of  breath.   Physical Exam: General: NAD, lying comfortably Appear in no distress, affect appropriate Eyes: PERRLA ENT: Oral Mucosa Clear, moist  Neck: no JVD,  Cardiovascular: S1 and S2 Present, no Murmur,  Respiratory: good respiratory effort, Bilateral Air entry equal and Decreased, no Crackles, no wheezes Abdomen: Bowel Sound present, Soft and no tenderness,  Skin: no rashes Extremities: no Pedal edema, no calf tenderness Neurologic: without any new focal findings Gait not checked due to patient safety concerns  Vitals:   03/19/23 0100 03/19/23 0500 03/19/23 0526 03/19/23 0900  BP:   137/73 124/78  Pulse:   80 88  Resp:   19 16  Temp: 98.4 F (36.9 C)  98 F (36.7 C) 98.4 F (36.9 C)  TempSrc:   Oral Oral  SpO2:   96% 98%  Weight:  82.7 kg    Height:        Intake/Output Summary (Last 24 hours) at 03/19/2023 0931 Last data filed at 03/18/2023 2100 Gross per 24 hour  Intake --  Output 700 ml  Net -700 ml   Filed Weights   03/17/23 0500 03/18/23 0500 03/19/23 0500  Weight: 81.2 kg 80.6 kg 82.7 kg    Data Reviewed: I have personally reviewed and interpreted daily labs, tele strips, imagings as discussed above. I reviewed all nursing notes, pharmacy notes, vitals, pertinent old records I have discussed plan of care as described above with RN and patient/family.  CBC: Recent Labs  Lab 03/14/23 0521 03/15/23 1042 03/17/23 0822 03/19/23 0823  WBC 7.3 8.3 6.4 7.3  NEUTROABS 5.2  --   --   --   HGB 13.3 13.2 12.0* 11.5*  HCT 40.2 40.2 36.0* 34.4*  MCV 90.3 90.3 89.3 89.6  PLT 143* 136* 139* 170   Basic Metabolic Panel: Recent Labs  Lab 03/14/23 0521 03/15/23 0501 03/15/23 1042 03/17/23 0822 03/19/23 0823  NA 130* 131* 133* 132* 135  K 4.2  --  4.3 4.1 4.1  CL 99  --  99 103 101  CO2 22  --  25 26 25   GLUCOSE 140*  --  112* 87 80  BUN 23  --  36* 19 12  CREATININE 0.96  --  0.93 0.78 0.76  CALCIUM 9.2  --  9.6 9.0 9.1  MG 2.4  --  2.4  --   --    PHOS 4.0  --  3.5  --   --     Studies: CT ABDOMEN PELVIS W CONTRAST  Result Date: 03/19/2023 CLINICAL DATA:  Sepsis. EXAM: CT ABDOMEN AND PELVIS WITH CONTRAST TECHNIQUE: Multidetector CT imaging of the abdomen and pelvis was performed using the standard protocol following bolus administration of intravenous contrast. RADIATION DOSE REDUCTION: This exam was performed according to the departmental dose-optimization program which includes automated exposure control, adjustment of the mA and/or kV according to patient size and/or use of iterative reconstruction technique. CONTRAST:  OMNIPAQUE IOHEXOL 300 MG/ML  SOLN COMPARISON:  None Available. FINDINGS: Lower chest: Patchy left lung base consolidation may represent atelectasis or scarring. Pneumonia is not excluded clinical correlation is recommended. There is coronary vascular calcification. No intra-abdominal free air or free fluid. Hepatobiliary: The liver is unremarkable. No biliary ductal dilatation. The gallbladder is unremarkable. Pancreas: Unremarkable. No pancreatic ductal dilatation or surrounding inflammatory changes. Spleen: Normal  in size without focal abnormality. Adrenals/Urinary Tract: The adrenal glands unremarkable. There is a 2.1 x 2.5 cm complex hypodense lesion in the inferior pole of the right kidney with apparent areas of enhancement as well as small areas of calcification concerning for neoplasm. Further characterization with MRI without and with contrast recommended. A 2 cm cyst in the inferior pole of the right kidney more superiorly noted. There is a focal area of parenchymal calcification in the lateral interpolar right kidney. A 2 mm nonobstructing left renal inferior pole calculus versus parenchymal calcification. There is no hydronephrosis on either side. Slight heterogeneous enhancement of the right renal parenchyma. Correlation with urinalysis recommended to exclude pyelonephritis. The visualized ureters and urinary  bladder probable. Stomach/Bowel: There is moderate stool throughout the colon. There is no bowel obstruction or active inflammation. The appendix is normal. Vascular/Lymphatic: Advanced aortoiliac atherosclerotic disease. There is a 3 cm infrarenal abdominal aortic aneurysm. The IVC is unremarkable. No portal venous gas. There is no adenopathy. Reproductive: The prostate and seminal vesicles are grossly unremarkable. No pelvic mass. Other: None Musculoskeletal: No acute or significant osseous findings. IMPRESSION: 1. A 2.1 x 2.5 cm complex hypodense lesion in the inferior pole of the right kidney concerning for neoplasm. Further characterization with MRI without and with contrast recommended. 2. Slight heterogeneous enhancement of the right renal parenchyma. Correlation with urinalysis recommended to evaluate for pyelonephritis. 3. No bowel obstruction. Normal appendix. 4. Patchy left lung base consolidation may represent atelectasis or scarring. Pneumonia is not excluded clinical correlation is recommended. 5. A 3 cm infrarenal abdominal aortic aneurysm. Recommend follow-up ultrasound every 3 years. This recommendation follows ACR consensus guidelines: White Paper of the ACR Incidental Findings Committee II on Vascular Findings. J Am Coll Radiol 2013; 10:789-794. 6.  Aortic Atherosclerosis (ICD10-I70.0). Electronically Signed   By: Elgie Collard M.D.   On: 03/19/2023 02:10    Scheduled Meds:  bisacodyl  10 mg Oral QHS   citalopram  40 mg Oral Daily   docusate sodium  100 mg Oral BID   enoxaparin (LOVENOX) injection  40 mg Subcutaneous QPM   fluPHENAZine  5 mg Oral Daily   polyethylene glycol  17 g Oral BID   risperiDONE  1 mg Oral Daily   simvastatin  20 mg Oral Daily   Continuous Infusions:  ertapenem 1 g (03/19/23 0518)   PRN Meds: acetaminophen **OR** acetaminophen, bisacodyl, hydrALAZINE, ondansetron **OR** ondansetron (ZOFRAN) IV, sodium phosphate  Time spent: 35 minutes  Author: Gillis Santa. MD [facility] 03/19/2023 9:31 AM  To reach On-call, see care teams to locate the attending and reach out to them via [email or web address]. If 7PM-7AM, please contact night-coverage If you still have difficulty reaching the attending provider, please page the [facility] (Director on Call) for [facility] on amion for assistance.

## 2023-03-19 NOTE — Progress Notes (Signed)
Pharmacy Antibiotic Note  [name] is a 72 y.o. male admitted on 03/04/2023 with a fall and associated head injury. PMH consists of HTN, bipolar disorder, COPD, schizophrenia, and cognitive deficit. Blood cultures resulted on 8/25 - bacteremia and ESBL infection . E Coli in 2 of 4 bottles on BCID, CTX-M resistance detected. Pharmacy has been consulted for meropenem dosing for ESBL E. coli bacteremia.   Plan: Day 5 of meropenem - continue 1 gram IV Q8H Renal function stable - pharmacy will continue to monitor and dose adjust appropriately   Height: 5\' 9"  (175.3 cm) Weight: 82.7 kg (182 lb 5.1 oz) IBW/kg (Calculated) : 70.7  Temp (24hrs), Avg:98.3 F (36.8 C), Min:98 F (36.7 C), Max:98.4 F (36.9 C)  Recent Labs  Lab 03/14/23 0521 03/15/23 1042 03/17/23 0822 03/19/23 0823  WBC 7.3 8.3 6.4 7.3  CREATININE 0.96 0.93 0.78 0.76    Estimated Creatinine Clearance: 83.5 mL/min (by C-G formula based on SCr of 0.76 mg/dL).    Allergies  Allergen Reactions   Asa [Aspirin]     Hx GI bleed   Antimicrobials this admission: Meropenem 8/25  >>   Dose adjustments this admission:N/A  Microbiology results: 8/25 BCx: 2/4 E. Coli - CTX-M resistance. ESBL 8/27 UCx: pending 8/28 BCx: NG < 12h  Thank you for allowing pharmacy to be a part of this patient's care.  Elliot Gurney, PharmD, BCPS Clinical Pharmacist  03/19/2023 10:28 AM

## 2023-03-20 DIAGNOSIS — R55 Syncope and collapse: Secondary | ICD-10-CM | POA: Diagnosis not present

## 2023-03-20 LAB — GLUCOSE, CAPILLARY: Glucose-Capillary: 90 mg/dL (ref 70–99)

## 2023-03-20 NOTE — Consult Note (Addendum)
PHARMACY CONSULT NOTE FOR:  OUTPATIENT  PARENTERAL ANTIBIOTIC THERAPY (OPAT)  Indication: ESBL E.coli bacteremia, possible pyelonephritis  Regimen: Ertapenem 1g IV Q24 hours End date: 03/24/2023  IV antibiotic discharge orders are pended. To discharging provider:  please sign these orders via discharge navigator,  Select New Orders & click on the button choice - Manage This Unsigned Work.     Thank you for allowing pharmacy to be a part of this patient's care.  [name] A Ermon Sagan 03/20/2023, 8:35 AM

## 2023-03-20 NOTE — Progress Notes (Signed)
[facility] Progress Note  Patient: [name]    ZOX:[id number]  DOA: 03/04/2023     Date of Service: the patient was seen and examined on 03/20/2023  Chief Complaint  Patient presents with   Fall   Head Injury   Brief hospital course: 72 year old male with past medical history significant for hypertension, bipolar disorder, COPD, schizophrenia and cognitive deficit, presented from group home to emergency department for evaluation of fall.    Patient sustained laceration that was repaired in the ED. Patient is admitted to hospitalist service for evaluation of syncope/dizziness. Patient had imaging studies including CT head, spine did not reveal any acute process.  Baseline cognitive deficits, he need supervision, high risk for fall, not safe to go home. TOC working with APS for [employer], legal guardian.   Assessment and Plan:   ESBL E. coli bacteremia, unknown source 8/25 blood culture growing ESBL E. Coli sensitive to imipenem S/p Meropenem 1 g IV TID, changed to ertapenem 1 g IV daily on 8/30 for total 10 days till 03/24/23 pharmacy consulted for dosing UA negative Bladder scan ruled out urinary retention 8/28 repeat blood cultures NGTD ID consulted, recommended 10 days IV antibiotics. CT A/P: Right perinephric stranding possible pyelonephritis, right renal mass, recommended MRI.  3 cm infrarenal abdominal aortic aneurysm.   Syncope and collapse:  Unsteady gait and fall Positive for orthostatic vitals.  TTE without significant finding.  Has unsteady gait.  Received IV fluid hydration. TOC working with APS, need legal guardian, placement. -Continue fall precaution --repeat orthostatic BP when able --PT/OT rec SNF   Elevated TSH:  TSH elevated to 34, however, free T4 is wnl.  Pt has methimazole on PTA home med list. --Initially started on Synthroid 25 mcg daily which was later increased to 75 mcg daily on 8/23.   Plan: --if pt was taking methimazole for hyperthyroidism PTA,  then instead of starting Synthroid for elevated TSH, can just hold methimazole for now. --d/c'd Synthroid on 8/25 -Repeat TSH and free T4 in 4 to 6 weeks   Schizophrenia [facility]): Stable -Continue Celexa Prolixin Risperdal  8/29 psych was consulted again to eval for capacity as per APS request 8/31 still waiting for psych for reevaluation of his capacity   COPD: Stable   Hyperlipidemia -cont home Zocor    Essential hypertension -hold home amlodipine and Lisinopril for now since BP is wnl to soft, and pt presented with orthostasis.   Isotonic hyponatremia, most likely due to poor oral intake Serum osmolality 292 --monitor Na  Constipation, started laxatives.  Right renal mass CT a/p: A 2.1 x 2.5 cm complex hypodense lesion in the inferior pole of the right kidney concerning for neoplasm. Further characterization with MRI without and with contrast recommended. 8/30 d/w Urologist Dr Alvester Morin, [name], rec MRI can be done as an outpatient, not urgent Follow urologist as an outpatient  Abdominal aortic aneurysm, incidental finding on CT scan A 3 cm infrarenal abdominal aortic aneurysm. Recommend follow-up ultrasound every 3 years.  Recommend to follow with PCP for surveillance as an outpatient  Body mass index is 26.53 kg/m.  Interventions:  Diet: Regular diet DVT Prophylaxis: Subcutaneous Lovenox   Advance goals of care discussion: Full code  Family Communication: family was not present at bedside, at the time of interview.  The pt provided permission to discuss medical plan with the family. Opportunity was given to ask question and all questions were answered satisfactorily.   Disposition:  Pt is from group home, admitted with syncope  and collapse, found to have bacteremia ESBL E. coli, still on antibiotics. DC plan to rehab, APS involved. Follow TOC for discharge planning.  Subjective: No significant events overnight, patient was laying comfortably, denied any dysuria, no  abdominal pain.  Denied any chest pain or palpitation, no shortness of breath.   Physical Exam: General: NAD, lying comfortably Appear in no distress, affect appropriate Eyes: PERRLA ENT: Oral Mucosa Clear, moist  Neck: no JVD,  Cardiovascular: S1 and S2 Present, no Murmur,  Respiratory: good respiratory effort, Bilateral Air entry equal and Decreased, no Crackles, no wheezes Abdomen: Bowel Sound present, Soft and no tenderness,  Skin: no rashes Extremities: no Pedal edema, no calf tenderness Neurologic: without any new focal findings Gait not checked due to patient safety concerns  Vitals:   03/19/23 2110 03/20/23 0453 03/20/23 0500 03/20/23 0745  BP: (!) 156/73 (!) 149/77  (!) 140/84  Pulse: 80 68  66  Resp: 12 12  16   Temp: 98.2 F (36.8 C) 98.2 F (36.8 C)  98 F (36.7 C)  TempSrc: Oral Oral  Oral  SpO2: (!) 89% 96%  95%  Weight:   81.5 kg   Height:        Intake/Output Summary (Last 24 hours) at 03/20/2023 1334 Last data filed at 03/20/2023 0546 Gross per 24 hour  Intake 62.47 ml  Output 1250 ml  Net -1187.53 ml   Filed Weights   03/18/23 0500 03/19/23 0500 03/20/23 0500  Weight: 80.6 kg 82.7 kg 81.5 kg    Data Reviewed: I have personally reviewed and interpreted daily labs, tele strips, imagings as discussed above. I reviewed all nursing notes, pharmacy notes, vitals, pertinent old records I have discussed plan of care as described above with RN and patient/family.  CBC: Recent Labs  Lab 03/14/23 0521 03/15/23 1042 03/17/23 0822 03/19/23 0823  WBC 7.3 8.3 6.4 7.3  NEUTROABS 5.2  --   --   --   HGB 13.3 13.2 12.0* 11.5*  HCT 40.2 40.2 36.0* 34.4*  MCV 90.3 90.3 89.3 89.6  PLT 143* 136* 139* 170   Basic Metabolic Panel: Recent Labs  Lab 03/14/23 0521 03/15/23 0501 03/15/23 1042 03/17/23 0822 03/19/23 0823  NA 130* 131* 133* 132* 135  K 4.2  --  4.3 4.1 4.1  CL 99  --  99 103 101  CO2 22  --  25 26 25   GLUCOSE 140*  --  112* 87 80  BUN 23   --  36* 19 12  CREATININE 0.96  --  0.93 0.78 0.76  CALCIUM 9.2  --  9.6 9.0 9.1  MG 2.4  --  2.4  --   --   PHOS 4.0  --  3.5  --   --     Studies: No results found.  Scheduled Meds:  bisacodyl  10 mg Oral QHS   citalopram  40 mg Oral Daily   docusate sodium  100 mg Oral BID   enoxaparin (LOVENOX) injection  40 mg Subcutaneous QPM   fluPHENAZine  5 mg Oral Daily   polyethylene glycol  17 g Oral BID   risperiDONE  1 mg Oral Daily   simvastatin  20 mg Oral Daily   Continuous Infusions:  ertapenem 1 g (03/20/23 0620)   PRN Meds: acetaminophen **OR** acetaminophen, bisacodyl, hydrALAZINE, ondansetron **OR** ondansetron (ZOFRAN) IV, sodium phosphate  Time spent: 35 minutes  Author: Gillis Santa. MD [facility] 03/20/2023 1:34 PM  To reach On-call, see care teams to  locate the attending and reach out to them via [email or web address]. If 7PM-7AM, please contact night-coverage If you still have difficulty reaching the attending provider, please page the [facility] (Director on Call) for [facility] on amion for assistance.

## 2023-03-20 NOTE — Plan of Care (Signed)
  Problem: Clinical Measurements: Goal: Will remain free from infection Outcome: Progressing Goal: Diagnostic test results will improve Outcome: Progressing   Problem: Activity: Goal: Risk for activity intolerance will decrease Outcome: Progressing   Problem: Nutrition: Goal: Adequate nutrition will be maintained Outcome: Progressing   Problem: Coping: Goal: Level of anxiety will decrease Outcome: Progressing   Problem: Pain Managment: Goal: General experience of comfort will improve Outcome: Progressing   

## 2023-03-21 DIAGNOSIS — R55 Syncope and collapse: Secondary | ICD-10-CM | POA: Diagnosis not present

## 2023-03-21 LAB — GLUCOSE, CAPILLARY
Glucose-Capillary: 88 mg/dL (ref 70–99)
Glucose-Capillary: 82 mg/dL (ref 70–99)
Glucose-Capillary: 76 mg/dL (ref 70–99)
Glucose-Capillary: 127 mg/dL — ABNORMAL HIGH (ref 70–99)

## 2023-03-21 NOTE — Consult Note (Signed)
[facility] Face-to-Face Psychiatry Consult   Reason for Consult: Cognitive decline. Referring Physician: Lucianne Muss Patient Identification: [name] MRN:  [id number] Principal Diagnosis: Syncope and collapse Diagnosis:  Principal Problem:   Syncope and collapse Active Problems:   Essential hypertension   GERD (gastroesophageal reflux disease)   Hyperlipidemia   COPD (chronic obstructive pulmonary disease) (HCC)   Schizophrenia (HCC)   Unsteady gait   Hypothyroidism   E coli bacteremia   Fall   Scalp laceration   Infection due to ESBL-producing Escherichia coli   Total Time spent with patient: 20 minutes  Subjective:   [name] is a 72 y.o. male patient admitted with loss of consciousness at group home.  He has a history of schizophrenia.  He seems to be doing well on his current medications.  HPI: COVID positive  Past Psychiatric History: History of schizophrenia and sees Dr. Vanetta Shawl outpatient.  Risk to Self:   Risk to Others:   Prior Inpatient Therapy:   Prior Outpatient Therapy:    Past Medical History:  Past Medical History:  Diagnosis Date   Bipolar disorder (HCC)    COPD (chronic obstructive pulmonary disease) (HCC)    Depression    GERD (gastroesophageal reflux disease)    GIB (gastrointestinal bleeding)    Hyperlipidemia    Hypertension    Mild dementia (HCC)    Paranoid schizophrenia (HCC)    Smoker    Tobacco dependence    History reviewed. No pertinent surgical history. Family History:  Family History  Family history unknown: Yes   Family Psychiatric  History: Unremarkable Social History:  Social History   Substance and Sexual Activity  Alcohol Use No   Alcohol/week: 0.0 standard drinks of alcohol     Social History   Substance and Sexual Activity  Drug Use No    Social History   Socioeconomic History   Marital status: Single    Spouse name: Not on file   Number of children: Not on file   Years of education: Not on file   Highest  education level: Not on file  Occupational History   Not on file  Tobacco Use   Smoking status: Every Day    Current packs/day: 1.00    Types: Cigarettes   Smokeless tobacco: Never  Vaping Use   Vaping status: Never Used  Substance and Sexual Activity   Alcohol use: No    Alcohol/week: 0.0 standard drinks of alcohol   Drug use: No   Sexual activity: Never  Other Topics Concern   Not on file  Social History Narrative   Not on file   Social Determinants of Health   Financial Resource Strain: Not on file  Food Insecurity: Patient Unable To Answer (03/05/2023)   Hunger Vital Sign    Worried About Running Out of Food in the Last Year: Patient unable to answer    Ran Out of Food in the Last Year: Patient unable to answer  Transportation Needs: Patient Unable To Answer (03/05/2023)   PRAPARE - Transportation    Lack of Transportation (Medical): Patient unable to answer    Lack of Transportation (Non-Medical): Patient unable to answer  Physical Activity: Not on file  Stress: Not on file  Social Connections: Not on file   Additional Social History:    Allergies:   Allergies  Allergen Reactions   Asa [Aspirin]     Hx GI bleed    Labs:  Results for orders placed or performed during the hospital encounter of 03/04/23 (from  the past 48 hour(s))  Glucose, capillary     Status: Abnormal   Collection Time: 03/19/23  4:03 PM  Result Value Ref Range   Glucose-Capillary 114 (H) 70 - 99 mg/dL    Comment: Glucose reference range applies only to samples taken after fasting for at least 8 hours.  Glucose, capillary     Status: None   Collection Time: 03/20/23  4:51 AM  Result Value Ref Range   Glucose-Capillary 90 70 - 99 mg/dL    Comment: Glucose reference range applies only to samples taken after fasting for at least 8 hours.  Glucose, capillary     Status: None   Collection Time: 03/21/23  4:51 AM  Result Value Ref Range   Glucose-Capillary 88 70 - 99 mg/dL    Comment: Glucose  reference range applies only to samples taken after fasting for at least 8 hours.  Glucose, capillary     Status: None   Collection Time: 03/21/23  8:20 AM  Result Value Ref Range   Glucose-Capillary 82 70 - 99 mg/dL    Comment: Glucose reference range applies only to samples taken after fasting for at least 8 hours.  Glucose, capillary     Status: None   Collection Time: 03/21/23 12:17 PM  Result Value Ref Range   Glucose-Capillary 76 70 - 99 mg/dL    Comment: Glucose reference range applies only to samples taken after fasting for at least 8 hours.    Current Facility-Administered Medications  Medication Dose Route Frequency Provider Last Rate Last Admin   acetaminophen (TYLENOL) tablet 650 mg  650 mg Oral Q6H PRN Gertha Calkin, MD   650 mg at 03/16/23 1610   Or   acetaminophen (TYLENOL) suppository 650 mg  650 mg Rectal Q6H PRN Gertha Calkin, MD       bisacodyl (DULCOLAX) EC tablet 10 mg  10 mg Oral QHS Gillis Santa, MD   10 mg at 03/18/23 2041   bisacodyl (DULCOLAX) suppository 10 mg  10 mg Rectal Daily PRN Gillis Santa, MD       citalopram (CELEXA) tablet 40 mg  40 mg Oral Daily Irena Cords V, MD   40 mg at 03/21/23 0851   docusate sodium (COLACE) capsule 100 mg  100 mg Oral BID Irena Cords V, MD   100 mg at 03/19/23 1016   enoxaparin (LOVENOX) injection 40 mg  40 mg Subcutaneous QPM Gillis Santa, MD   40 mg at 03/20/23 2139   ertapenem [facility]) 1 g in sodium chloride 0.9 % 100 mL IVPB  1 g Intravenous Q24H Lynn Ito, MD 200 mL/hr at 03/21/23 0524 1 g at 03/21/23 0524   fluPHENAZine (PROLIXIN) tablet 5 mg  5 mg Oral Daily Irena Cords V, MD   5 mg at 03/21/23 0919   hydrALAZINE (APRESOLINE) injection 10 mg  10 mg Intravenous Q6H PRN Gertha Calkin, MD       ondansetron (ZOFRAN) tablet 4 mg  4 mg Oral Q6H PRN Gertha Calkin, MD       Or   ondansetron (ZOFRAN) injection 4 mg  4 mg Intravenous Q6H PRN Gertha Calkin, MD       polyethylene glycol (MIRALAX / GLYCOLAX) packet  17 g  17 g Oral BID Gillis Santa, MD   17 g at 03/19/23 1016   risperiDONE (RISPERDAL) tablet 1 mg  1 mg Oral Daily Gertha Calkin, MD   1 mg at 03/21/23 0852   simvastatin (ZOCOR)  tablet 20 mg  20 mg Oral Daily Darlin Priestly, MD   20 mg at 03/21/23 0851   sodium phosphate (FLEET) enema 1 enema  1 enema Rectal Once PRN Gertha Calkin, MD        Musculoskeletal: Strength & Muscle Tone: within normal limits Gait & Station: normal Patient leans: N/A            Psychiatric Specialty Exam:  Presentation  General Appearance: No data recorded Eye Contact:No data recorded Speech:No data recorded Speech Volume:No data recorded Handedness:No data recorded  Mood and Affect  Mood:No data recorded Affect:No data recorded  Thought Process  Thought Processes:No data recorded Descriptions of Associations:No data recorded Orientation:No data recorded Thought Content:No data recorded History of Schizophrenia/Schizoaffective disorder:No data recorded Duration of Psychotic Symptoms:No data recorded Hallucinations:No data recorded Ideas of Reference:No data recorded Suicidal Thoughts:No data recorded Homicidal Thoughts:No data recorded  Sensorium  Memory:No data recorded Judgment:No data recorded Insight:No data recorded  Executive Functions  Concentration:No data recorded Attention Span:No data recorded Recall:No data recorded Fund of Knowledge:No data recorded Language:No data recorded  Psychomotor Activity  Psychomotor Activity:No data recorded  Assets  Assets:No data recorded  Sleep  Sleep:No data recorded Donavon is a 72 year old African-American male who was unconscious and found at his group home and transferred to the hospital.  He is COVID positive.  He has a history of schizophrenia and cognitive problems.  He seems to be doing pretty well on his current medications of Risperdal and Prolixin.  He denies any auditory or visual hallucinations.  He denies any suicidal or  homicidal ideation.  He is alert and oriented x 2.  Judgment and insight are poor. HE DOES NOT HAVE THE CAPACITY TO MAKE DECISIONS FOR HIMSELF.  Blood pressure (!) 136/59, pulse 74, temperature 98 F (36.7 C), resp. rate 20, height 5\' 9"  (1.753 m), weight 77.7 kg, SpO2 97%. Body mass index is 25.3 kg/m.  Treatment Plan Summary: Medication management and Plan continue current medication.  Disposition: No evidence of imminent risk to self or others at present.   Patient does not meet criteria for psychiatric inpatient admission.  Sarina Ill, DO 03/21/2023 12:50 PM

## 2023-03-21 NOTE — Progress Notes (Signed)
[facility] Progress Note  Patient: [name]    NFA:[id number]  DOA: 03/04/2023     Date of Service: the patient was seen and examined on 03/21/2023  Chief Complaint  Patient presents with   Fall   Head Injury   Brief hospital course: 72 year old male with past medical history significant for hypertension, bipolar disorder, COPD, schizophrenia and cognitive deficit, presented from group home to emergency department for evaluation of fall.    Patient sustained laceration that was repaired in the ED. Patient is admitted to hospitalist service for evaluation of syncope/dizziness. Patient had imaging studies including CT head, spine did not reveal any acute process.  Baseline cognitive deficits, he need supervision, high risk for fall, not safe to go home. TOC working with APS for [employer], legal guardian.   Assessment and Plan:   ESBL E. coli bacteremia, unknown source 8/25 blood culture growing ESBL E. Coli sensitive to imipenem S/p Meropenem 1 g IV TID, changed to ertapenem 1 g IV daily on 8/30 for total 10 days till 03/24/23 pharmacy consulted for dosing UA negative Bladder scan ruled out urinary retention 8/28 repeat blood cultures NGTD ID consulted, recommended 10 days IV antibiotics. CT A/P: Right perinephric stranding possible pyelonephritis, right renal mass, recommended MRI.  3 cm infrarenal abdominal aortic aneurysm.   Syncope and collapse:  Unsteady gait and fall Positive for orthostatic vitals.  TTE without significant finding.  Has unsteady gait.  Received IV fluid hydration. TOC working with APS, need legal guardian, placement. -Continue fall precaution --repeat orthostatic BP when able --PT/OT rec SNF   Elevated TSH:  TSH elevated to 34, however, free T4 is wnl.  Pt has methimazole on PTA home med list. --Initially started on Synthroid 25 mcg daily which was later increased to 75 mcg daily on 8/23.   Plan: --if pt was taking methimazole for hyperthyroidism PTA,  then instead of starting Synthroid for elevated TSH, can just hold methimazole for now. --d/c'd Synthroid on 8/25 -Repeat TSH and free T4 in 4 to 6 weeks   Schizophrenia [facility]): Stable -Continue Celexa Prolixin Risperdal  8/29 psych was consulted again to eval for capacity as per APS request 9/1 still waiting for psych for reevaluation of his capacity   COPD: Stable   Hyperlipidemia -cont home Zocor    Essential hypertension -hold home amlodipine and Lisinopril for now since BP is wnl to soft, and pt presented with orthostasis.   Isotonic hyponatremia, Resolved most likely due to poor oral intake Serum osmolality 292 --monitor Na  Constipation, started laxatives.  Right renal mass CT a/p: A 2.1 x 2.5 cm complex hypodense lesion in the inferior pole of the right kidney concerning for neoplasm. Further characterization with MRI without and with contrast recommended. 8/30 d/w Urologist Dr Alvester Morin, [name], rec MRI can be done as an outpatient, not urgent Follow urologist as an outpatient  Abdominal aortic aneurysm, incidental finding on CT scan A 3 cm infrarenal abdominal aortic aneurysm. Recommend follow-up ultrasound every 3 years.  Recommend to follow with PCP for surveillance as an outpatient  Body mass index is 25.3 kg/m.  Interventions:  Diet: Regular diet DVT Prophylaxis: Subcutaneous Lovenox   Advance goals of care discussion: Full code  Family Communication: family was not present at bedside, at the time of interview.  The pt provided permission to discuss medical plan with the family. Opportunity was given to ask question and all questions were answered satisfactorily.   Disposition:  Pt is from group home, admitted with  syncope and collapse, found to have bacteremia ESBL E. coli, still on antibiotics. DC plan to rehab, APS involved. Follow TOC for discharge planning.  Subjective: No significant events overnight, patient was laying comfortably, denied any  dysuria, no abdominal pain.  Denied any chest pain or palpitation, no shortness of breath.   Physical Exam: General: NAD, lying comfortably Appear in no distress, affect appropriate Eyes: PERRLA ENT: Oral Mucosa Clear, moist  Neck: no JVD,  Cardiovascular: S1 and S2 Present, no Murmur,  Respiratory: good respiratory effort, Bilateral Air entry equal and Decreased, no Crackles, no wheezes Abdomen: Bowel Sound present, Soft and no tenderness,  Skin: no rashes Extremities: no Pedal edema, no calf tenderness Neurologic: without any new focal findings Gait not checked due to patient safety concerns  Vitals:   03/20/23 2044 03/21/23 0315 03/21/23 0449 03/21/23 0820  BP: (!) 145/74  (!) 140/76 (!) 136/59  Pulse: 83  73 74  Resp: 20  12 20   Temp: 98.3 F (36.8 C)  98.2 F (36.8 C) 98 F (36.7 C)  TempSrc: Oral  Oral   SpO2: 100%  93% 97%  Weight:  77.7 kg    Height:        Intake/Output Summary (Last 24 hours) at 03/21/2023 1249 Last data filed at 03/21/2023 0900 Gross per 24 hour  Intake --  Output 1500 ml  Net -1500 ml   Filed Weights   03/19/23 0500 03/20/23 0500 03/21/23 0315  Weight: 82.7 kg 81.5 kg 77.7 kg    Data Reviewed: I have personally reviewed and interpreted daily labs, tele strips, imagings as discussed above. I reviewed all nursing notes, pharmacy notes, vitals, pertinent old records I have discussed plan of care as described above with RN and patient/family.  CBC: Recent Labs  Lab 03/15/23 1042 03/17/23 0822 03/19/23 0823  WBC 8.3 6.4 7.3  HGB 13.2 12.0* 11.5*  HCT 40.2 36.0* 34.4*  MCV 90.3 89.3 89.6  PLT 136* 139* 170   Basic Metabolic Panel: Recent Labs  Lab 03/15/23 0501 03/15/23 1042 03/17/23 0822 03/19/23 0823  NA 131* 133* 132* 135  K  --  4.3 4.1 4.1  CL  --  99 103 101  CO2  --  25 26 25   GLUCOSE  --  112* 87 80  BUN  --  36* 19 12  CREATININE  --  0.93 0.78 0.76  CALCIUM  --  9.6 9.0 9.1  MG  --  2.4  --   --   PHOS  --  3.5   --   --     Studies: No results found.  Scheduled Meds:  bisacodyl  10 mg Oral QHS   citalopram  40 mg Oral Daily   docusate sodium  100 mg Oral BID   enoxaparin (LOVENOX) injection  40 mg Subcutaneous QPM   fluPHENAZine  5 mg Oral Daily   polyethylene glycol  17 g Oral BID   risperiDONE  1 mg Oral Daily   simvastatin  20 mg Oral Daily   Continuous Infusions:  ertapenem 1 g (03/21/23 0524)   PRN Meds: acetaminophen **OR** acetaminophen, bisacodyl, hydrALAZINE, ondansetron **OR** ondansetron (ZOFRAN) IV, sodium phosphate  Time spent: 35 minutes  Author: Gillis Santa. MD [facility] 03/21/2023 12:49 PM  To reach On-call, see care teams to locate the attending and reach out to them via [email or web address]. If 7PM-7AM, please contact night-coverage If you still have difficulty reaching the attending provider, please page the [facility] (Director on Call) for  [facility] on amion for assistance.

## 2023-03-21 NOTE — Plan of Care (Signed)
  Problem: Clinical Measurements: Goal: Ability to maintain clinical measurements within normal limits will improve Outcome: Progressing Goal: Will remain free from infection Outcome: Progressing Goal: Diagnostic test results will improve Outcome: Progressing Goal: Cardiovascular complication will be avoided Outcome: Progressing   Problem: Pain Managment: Goal: General experience of comfort will improve Outcome: Progressing   Problem: Safety: Goal: Ability to remain free from injury will improve Outcome: Progressing   Problem: Skin Integrity: Goal: Risk for impaired skin integrity will decrease Outcome: Progressing   Problem: Elimination: Goal: Will not experience complications related to bowel motility Outcome: Progressing Goal: Will not experience complications related to urinary retention Outcome: Progressing   Problem: Nutrition: Goal: Adequate nutrition will be maintained Outcome: Progressing   Problem: Education: Goal: Knowledge of condition and prescribed therapy will improve Outcome: Progressing

## 2023-03-21 NOTE — Plan of Care (Signed)
  Problem: Pain Managment: Goal: General experience of comfort will improve Outcome: Progressing   Problem: Education: Goal: Knowledge of condition and prescribed therapy will improve Outcome: Not Progressing   Problem: Activity: Goal: Risk for activity intolerance will decrease Outcome: Not Progressing

## 2023-03-22 DIAGNOSIS — R55 Syncope and collapse: Secondary | ICD-10-CM | POA: Diagnosis not present

## 2023-03-22 LAB — BASIC METABOLIC PANEL
Anion gap: 6 (ref 5–15)
BUN: 18 mg/dL (ref 8–23)
CO2: 25 mmol/L (ref 22–32)
Calcium: 9.2 mg/dL (ref 8.9–10.3)
Chloride: 103 mmol/L (ref 98–111)
Creatinine, Ser: 0.86 mg/dL (ref 0.61–1.24)
GFR, Estimated: >60 mL/min (ref >60)
Glucose, Bld: 81 mg/dL (ref 70–99)
Potassium: 4 mmol/L (ref 3.5–5.1)
Sodium: 134 mmol/L — ABNORMAL LOW (ref 135–145)

## 2023-03-22 LAB — CULTURE, BLOOD (ROUTINE X 2)
Culture: NO GROWTH
Culture: NO GROWTH
Special Requests: ADEQUATE
Special Requests: ADEQUATE

## 2023-03-22 LAB — CBC
HCT: 36.5 % — ABNORMAL LOW (ref 39.0–52.0)
Hemoglobin: 11.6 g/dL — ABNORMAL LOW (ref 13.0–17.0)
MCH: 28.9 pg (ref 26.0–34.0)
MCHC: 31.8 g/dL (ref 30.0–36.0)
MCV: 90.8 fL (ref 80.0–100.0)
Platelets: 238 10*3/uL (ref 150–400)
RBC: 4.02 MIL/uL — ABNORMAL LOW (ref 4.22–5.81)
RDW: 12.8 % (ref 11.5–15.5)
WBC: 7.7 10*3/uL (ref 4.0–10.5)
nRBC: 0 % (ref 0.0–0.2)

## 2023-03-22 LAB — GLUCOSE, CAPILLARY: Glucose-Capillary: 86 mg/dL (ref 70–99)

## 2023-03-22 NOTE — Plan of Care (Signed)
  Problem: Education: Goal: Knowledge of condition and prescribed therapy will improve Outcome: Progressing   Problem: Cardiac: Goal: Will achieve and/or maintain adequate cardiac output Outcome: Progressing   Problem: Physical Regulation: Goal: Complications related to the disease process, condition or treatment will be avoided or minimized Outcome: Progressing   Problem: Education: Goal: Knowledge of General Education information will improve Description: Including pain rating scale, medication(s)/side effects and non-pharmacologic comfort measures Outcome: Progressing   Problem: Health Behavior/Discharge Planning: Goal: Ability to manage health-related needs will improve Outcome: Progressing   Problem: Clinical Measurements: Goal: Ability to maintain clinical measurements within normal limits will improve Outcome: Progressing Goal: Will remain free from infection Outcome: Progressing Goal: Diagnostic test results will improve Outcome: Progressing Goal: Cardiovascular complication will be avoided Outcome: Progressing   Problem: Activity: Goal: Risk for activity intolerance will decrease Outcome: Progressing   Problem: Nutrition: Goal: Adequate nutrition will be maintained Outcome: Progressing   Problem: Coping: Goal: Level of anxiety will decrease Outcome: Progressing   Problem: Elimination: Goal: Will not experience complications related to bowel motility Outcome: Progressing Goal: Will not experience complications related to urinary retention Outcome: Progressing   Problem: Pain Managment: Goal: General experience of comfort will improve Outcome: Progressing   Problem: Safety: Goal: Ability to remain free from injury will improve Outcome: Progressing   Problem: Skin Integrity: Goal: Risk for impaired skin integrity will decrease Outcome: Progressing

## 2023-03-22 NOTE — Progress Notes (Signed)
[facility] Progress Note  Patient: [name]    ZOX:[id number]  DOA: 03/04/2023     Date of Service: the patient was seen and examined on 03/22/2023  Chief Complaint  Patient presents with   Fall   Head Injury   Brief hospital course: 72 year old male with past medical history significant for hypertension, bipolar disorder, COPD, schizophrenia and cognitive deficit, presented from group home to emergency department for evaluation of fall.    Patient sustained laceration that was repaired in the ED. Patient is admitted to hospitalist service for evaluation of syncope/dizziness. Patient had imaging studies including CT head, spine did not reveal any acute process.  Baseline cognitive deficits, he need supervision, high risk for fall, not safe to go home. TOC working with APS for [employer], legal guardian.   Assessment and Plan:   ESBL E. coli bacteremia, unknown source 8/25 blood culture growing ESBL E. Coli sensitive to imipenem S/p Meropenem 1 g IV TID, changed to ertapenem 1 g IV daily on 8/30 for total 10 days till 03/24/23 pharmacy consulted for dosing UA negative Bladder scan ruled out urinary retention 8/28 repeat blood cultures NGTD ID consulted, recommended 10 days IV antibiotics. CT A/P: Right perinephric stranding possible pyelonephritis, right renal mass, recommended MRI.  3 cm infrarenal abdominal aortic aneurysm.   Syncope and collapse:  Unsteady gait and fall Positive for orthostatic vitals.  TTE without significant finding.  Has unsteady gait.  Received IV fluid hydration. TOC working with APS, need legal guardian, placement. -Continue fall precaution --repeat orthostatic BP when able --PT/OT rec SNF   Elevated TSH:  TSH elevated to 34, however, free T4 is wnl.  Pt has methimazole on PTA home med list. --Initially started on Synthroid 25 mcg daily which was later increased to 75 mcg daily on 8/23.   Plan: --if pt was taking methimazole for hyperthyroidism PTA,  then instead of starting Synthroid for elevated TSH, can just hold methimazole for now. --d/c'd Synthroid on 8/25 -Repeat TSH and free T4 in 4 to 6 weeks   Schizophrenia [facility]): Stable -Continue Celexa Prolixin Risperdal  8/29 psych was consulted again to eval for capacity as per APS request 9/1 see by psych, awaiting for capacity evaluation.   9/2 Texted to psych again today   COPD: Stable   Hyperlipidemia -cont home Zocor    Essential hypertension -hold home amlodipine and Lisinopril for now since BP is wnl to soft, and pt presented with orthostasis.   Isotonic hyponatremia, Resolved most likely due to poor oral intake Serum osmolality 292 --monitor Na  Constipation, started laxatives.  Right renal mass CT a/p: A 2.1 x 2.5 cm complex hypodense lesion in the inferior pole of the right kidney concerning for neoplasm. Further characterization with MRI without and with contrast recommended. 8/30 d/w Urologist Dr Alvester Morin, [name], rec MRI can be done as an outpatient, not urgent Follow urologist as an outpatient  Abdominal aortic aneurysm, incidental finding on CT scan A 3 cm infrarenal abdominal aortic aneurysm. Recommend follow-up ultrasound every 3 years.  Recommend to follow with PCP for surveillance as an outpatient  Body mass index is 26.01 kg/m.  Interventions:  Diet: Regular diet DVT Prophylaxis: Subcutaneous Lovenox   Advance goals of care discussion: Full code  Family Communication: family was not present at bedside, at the time of interview.  The pt provided permission to discuss medical plan with the family. Opportunity was given to ask question and all questions were answered satisfactorily.   Disposition:  Pt  is from group home, admitted with syncope and collapse, found to have bacteremia ESBL E. coli, still on antibiotics. DC plan to rehab, APS involved. Follow TOC for discharge planning.  Subjective: No significant events overnight, patient was sleepy  today, woke up by calling his name.  Resting comfortably, denied any complaints.  Physical Exam: General: NAD, lying comfortably Appear in no distress, affect appropriate Eyes: PERRLA ENT: Oral Mucosa Clear, moist  Neck: no JVD,  Cardiovascular: S1 and S2 Present, no Murmur,  Respiratory: good respiratory effort, Bilateral Air entry equal and Decreased, no Crackles, no wheezes Abdomen: Bowel Sound present, Soft and no tenderness,  Skin: no rashes Extremities: no Pedal edema, no calf tenderness Neurologic: without any new focal findings Gait not checked due to patient safety concerns  Vitals:   03/21/23 1540 03/22/23 0500 03/22/23 0540 03/22/23 0811  BP: 109/64  130/77 114/64  Pulse: 91  80 75  Resp: 18  18 18   Temp: 98 F (36.7 C)  98.4 F (36.9 C) 98.7 F (37.1 C)  TempSrc:   Oral Oral  SpO2: 96%  95% 97%  Weight:  79.9 kg    Height:        Intake/Output Summary (Last 24 hours) at 03/22/2023 1340 Last data filed at 03/22/2023 0540 Gross per 24 hour  Intake 240 ml  Output 1500 ml  Net -1260 ml   Filed Weights   03/20/23 0500 03/21/23 0315 03/22/23 0500  Weight: 81.5 kg 77.7 kg 79.9 kg    Data Reviewed: I have personally reviewed and interpreted daily labs, tele strips, imagings as discussed above. I reviewed all nursing notes, pharmacy notes, vitals, pertinent old records I have discussed plan of care as described above with RN and patient/family.  CBC: Recent Labs  Lab 03/17/23 0822 03/19/23 0823 03/22/23 0829  WBC 6.4 7.3 7.7  HGB 12.0* 11.5* 11.6*  HCT 36.0* 34.4* 36.5*  MCV 89.3 89.6 90.8  PLT 139* 170 238   Basic Metabolic Panel: Recent Labs  Lab 03/17/23 0822 03/19/23 0823 03/22/23 0829  NA 132* 135 134*  K 4.1 4.1 4.0  CL 103 101 103  CO2 26 25 25   GLUCOSE 87 80 81  BUN 19 12 18   CREATININE 0.78 0.76 0.86  CALCIUM 9.0 9.1 9.2    Studies: No results found.  Scheduled Meds:  bisacodyl  10 mg Oral QHS   citalopram  40 mg Oral Daily    docusate sodium  100 mg Oral BID   enoxaparin (LOVENOX) injection  40 mg Subcutaneous QPM   fluPHENAZine  5 mg Oral Daily   polyethylene glycol  17 g Oral BID   risperiDONE  1 mg Oral Daily   simvastatin  20 mg Oral Daily   Continuous Infusions:  ertapenem 1 g (03/22/23 0526)   PRN Meds: acetaminophen **OR** acetaminophen, bisacodyl, hydrALAZINE, ondansetron **OR** ondansetron (ZOFRAN) IV, sodium phosphate  Time spent: 35 minutes  Author: Gillis Santa. MD [facility] 03/22/2023 1:40 PM  To reach On-call, see care teams to locate the attending and reach out to them via [email or web address]. If 7PM-7AM, please contact night-coverage If you still have difficulty reaching the attending provider, please page the [facility] (Director on Call) for [facility] on amion for assistance.

## 2023-03-22 NOTE — Progress Notes (Signed)
72 yr male presented with syncope,and developed fever a few days later ESBL ecoli bacteremia rt pyelonephritis  Bladder scan no residual urine Urine culture NG 9 but after 48 hrs of antibiotic) Renal scan no hydro repeat blood culture neg ?CT abdomen shows complex lesion rt kidney concerning for neoplasm On ertapenem - will need for minimum 10 days-until 03/24/23    Syncope ? Bipolar disorder   Discussed the management with hospitalist ID will sign off- call if needed

## 2023-03-23 DIAGNOSIS — R55 Syncope and collapse: Secondary | ICD-10-CM | POA: Diagnosis not present

## 2023-03-23 LAB — GLUCOSE, CAPILLARY: Glucose-Capillary: 97 mg/dL (ref 70–99)

## 2023-03-23 MED ORDER — CARMEX CLASSIC LIP BALM EX OINT
TOPICAL_OINTMENT | CUTANEOUS | Status: DC | PRN
  Filled 2023-03-23: qty 1

## 2023-03-23 MED ORDER — SODIUM CHLORIDE 0.9 % IV SOLN
1.0000 g | INTRAVENOUS | Status: AC
  Administered 2023-03-24: 1 g via INTRAVENOUS
  Filled 2023-03-23: qty 1000

## 2023-03-23 NOTE — Evaluation (Signed)
Occupational Therapy Evaluation Patient Details Name: [name] MRN: [id number] DOB: 23-Apr-1951 Today's Date: 03/23/2023   History of Present Illness Patient is a 72 year old male from group home with unwitnessed fall with possible syncopal episode, striking his head. History of hypertension, hyperlipidemia, COPD, bipolar disorder, and schizophrenia. MRI of brain with no acute intracranial pathology   Clinical Impression   Patient presenting with decreased Ind in self care, balance, functional mobility/transfers, endurance, and safety awareness. Patient reports living at home alone but per chart review pt lives at group home. Pt needing increased time to process and gives very short answers to questions.  Patient currently functioning at min A for bed mobility and mod A for functional transfer. Pt seated in recliner chair with chair alarm activated and call bell and all needed items within reach. Lunch tray set up in front of pt in chair as therapist exits the room.   Patient will benefit from acute OT to increase overall independence in the areas of ADLs, functional mobility, and safety awareness in order to safely discharge.      If plan is discharge home, recommend the following: A lot of help with walking and/or transfers;A lot of help with bathing/dressing/bathroom;Assistance with cooking/housework;Assist for transportation;Help with stairs or ramp for entrance;Direct supervision/assist for financial management;Direct supervision/assist for medications management    Functional Status Assessment  Patient has had a recent decline in their functional status and demonstrates the ability to make significant improvements in function in a reasonable and predictable amount of time.  Equipment Recommendations  Other (comment) (defer to next venue of care)       Precautions / Restrictions Precautions Precautions: Fall Restrictions Weight Bearing Restrictions: No      Mobility Bed  Mobility Overal bed mobility: Needs Assistance Bed Mobility: Supine to Sit     Supine to sit: Min assist          Transfers Overall transfer level: Needs assistance Equipment used: Rolling walker (2 wheels) Transfers: Sit to/from Stand Sit to Stand: Mod assist           General transfer comment: Pt needing increased cuing for technique and sequence      Balance Overall balance assessment: Needs assistance Sitting-balance support: Feet supported Sitting balance-Leahy Scale: Good     Standing balance support: Bilateral upper extremity supported, During functional activity, Reliant on assistive device for balance Standing balance-Leahy Scale: Poor                             ADL either performed or assessed with clinical judgement   ADL Overall ADL's : Needs assistance/impaired Eating/Feeding: Set up;Sitting                       Toilet Transfer: BSC/3in1;Moderate assistance Toilet Transfer Details (indicate cue type and reason): simulated                 Vision Patient Visual Report: No change from baseline              Pertinent Vitals/Pain Pain Assessment Pain Assessment: No/denies pain     Extremity/Trunk Assessment Upper Extremity Assessment Upper Extremity Assessment: Overall WFL for tasks assessed   Lower Extremity Assessment Lower Extremity Assessment: Generalized weakness       Communication Communication Communication: Difficulty communicating thoughts/reduced clarity of speech;Difficulty following commands/understanding Following commands: Follows one step commands with increased time   Cognition Arousal: Alert Behavior During  Therapy: Flat affect Overall Cognitive Status: History of cognitive impairments - at baseline                                 General Comments: Pt needing increased time to process and communicate. He does best with yes and no questions.                Home Living  Family/patient expects to be discharged to:: Group home                                 Additional Comments: Pt reports living at home alone but per chart review pt living at group home.      Prior Functioning/Environment Prior Level of Function : Patient poor historian/Family not available             Mobility Comments: patient reports he uses a walker at times and has assist at times with mobility. ADLs Comments: Pt reports being Ind in self care tasks        OT Problem List: Decreased strength;Decreased activity tolerance;Decreased safety awareness;Impaired balance (sitting and/or standing);Decreased knowledge of use of DME or AE      OT Treatment/Interventions: Self-care/ADL training;Therapeutic exercise;Therapeutic activities;Energy conservation;DME and/or AE instruction;Patient/family education;Balance training    OT Goals(Current goals can be found in the care plan section) Acute Rehab OT Goals Patient Stated Goal: none stated Time For Goal Achievement: 04/06/23 Potential to Achieve Goals: Fair ADL Goals Pt Will Perform Grooming: with supervision;standing Pt Will Perform Lower Body Dressing: with supervision;sit to/from stand Pt Will Transfer to Toilet: with supervision;ambulating Pt Will Perform Toileting - Clothing Manipulation and hygiene: with supervision;sit to/from stand  OT Frequency: Min 1X/week       AM-PAC OT "6 Clicks" Daily Activity     Outcome Measure Help from another person eating meals?: None Help from another person taking care of personal grooming?: A Little Help from another person toileting, which includes using toliet, bedpan, or urinal?: A Lot Help from another person bathing (including washing, rinsing, drying)?: A Lot Help from another person to put on and taking off regular upper body clothing?: A Lot Help from another person to put on and taking off regular lower body clothing?: A Lot 6 Click Score: 15   End of Session  Nurse Communication: Mobility status  Activity Tolerance: Patient tolerated treatment well Patient left: in chair;with call bell/phone within reach;with chair alarm set  OT Visit Diagnosis: Unsteadiness on feet (R26.81);Repeated falls (R29.6);Muscle weakness (generalized) (M62.81)                Time: 1355-1410 OT Time Calculation (min): 15 min Charges:  OT General Charges $OT Visit: 1 Visit OT Evaluation $OT Eval Moderate Complexity: 1 Mod OT Treatments $Therapeutic Activity: 8-22 mins  Jackquline Denmark, MS, OTR/L , CBIS ascom [phone]  03/23/23, 3:41 PM

## 2023-03-23 NOTE — Plan of Care (Signed)
  Problem: Education: Goal: Knowledge of condition and prescribed therapy will improve Outcome: Progressing   Problem: Cardiac: Goal: Will achieve and/or maintain adequate cardiac output Outcome: Progressing   Problem: Physical Regulation: Goal: Complications related to the disease process, condition or treatment will be avoided or minimized Outcome: Progressing   Problem: Education: Goal: Knowledge of General Education information will improve Description: Including pain rating scale, medication(s)/side effects and non-pharmacologic comfort measures Outcome: Progressing   Problem: Health Behavior/Discharge Planning: Goal: Ability to manage health-related needs will improve Outcome: Progressing   Problem: Clinical Measurements: Goal: Ability to maintain clinical measurements within normal limits will improve Outcome: Progressing Goal: Will remain free from infection Outcome: Progressing Goal: Diagnostic test results will improve Outcome: Progressing Goal: Cardiovascular complication will be avoided Outcome: Progressing   Problem: Activity: Goal: Risk for activity intolerance will decrease Outcome: Progressing   Problem: Nutrition: Goal: Adequate nutrition will be maintained Outcome: Progressing   Problem: Coping: Goal: Level of anxiety will decrease Outcome: Progressing   Problem: Elimination: Goal: Will not experience complications related to bowel motility Outcome: Progressing Goal: Will not experience complications related to urinary retention Outcome: Progressing   Problem: Pain Managment: Goal: General experience of comfort will improve Outcome: Progressing   Problem: Safety: Goal: Ability to remain free from injury will improve Outcome: Progressing   Problem: Skin Integrity: Goal: Risk for impaired skin integrity will decrease Outcome: Progressing

## 2023-03-23 NOTE — Progress Notes (Signed)
Physical Therapy Treatment Patient Details Name: [name] MRN: [id number] DOB: [birth year] Today's Date: 03/23/2023   History of Present Illness Patient is a 72 year old male from group home with unwitnessed fall with possible syncopal episode, striking his head. History of hypertension, hyperlipidemia, COPD, bipolar disorder, and schizophrenia. MRI of brain with no acute intracranial pathology    PT Comments  Pt with flat affect with minimal verbal communication but followed 1-step commands well and put forth good effort during the session.  Pt required heavy mod A to come to standing from the recliner with cues for sequencing and once in standing needed Mod A to prevent posterior LOB initially but once pt was steady required only occasional min A for stability during ambulation.  Pt fatigued quickly with gait and was limited to ambulating 6 feet x 3 with seated rest break between walks.  Pt's SpO2 was WNL on room air during the session with HR increasing from the low 100s to the 130s after ambulation.  Pt will benefit from continued PT services upon discharge to safely address deficits listed in patient problem list for decreased caregiver assistance and eventual return to PLOF.     If plan is discharge home, recommend the following: A lot of help with walking and/or transfers;A lot of help with bathing/dressing/bathroom;Help with stairs or ramp for entrance;Assist for transportation;Direct supervision/assist for medications management   Can travel by private vehicle     No  Equipment Recommendations  Other (comment) (TBD at next venue of care)    Recommendations for Other Services       Precautions / Restrictions Precautions Precautions: Fall Restrictions Weight Bearing Restrictions: No     Mobility  Bed Mobility               General bed mobility comments: NT, pt in recliner pre-post session    Transfers Overall transfer level: Needs assistance Equipment used:  Rolling walker (2 wheels) Transfers: Sit to/from Stand Sit to Stand: Mod assist           General transfer comment: Heavy multi-modal cues for sequencing, most notably for increased trunk flexion, Mod A to stand with poor eccentric control during sitting    Ambulation/Gait Ambulation/Gait assistance: Min assist Gait Distance (Feet): 6 Feet x 3 Assistive device: Rolling walker (2 wheels) Gait Pattern/deviations: Step-to pattern, Decreased step length - right, Decreased step length - left, Decreased stride length, Narrow base of support, Shuffle, Trunk flexed Gait velocity: Decreased     General Gait Details: Very slow, effortful gait pattern with mostly shuffling steps with chair follow for safety; min A for stability and to help advance the RW   Stairs             Wheelchair Mobility     Tilt Bed    Modified Rankin (Stroke Patients Only)       Balance       Sitting balance - Comments: Unsupported sitting NT this session   Standing balance support: Bilateral upper extremity supported, During functional activity, Reliant on assistive device for balance Standing balance-Leahy Scale: Poor                              Cognition Arousal: Alert Behavior During Therapy: Flat affect Overall Cognitive Status: History of cognitive impairments - at baseline  General Comments: Pt needs encouragement to communicate verbally during session and even then typically responds with very few words        Exercises Total Joint Exercises Ankle Circles/Pumps: AROM, AAROM, Strengthening, Both, 5 reps, 10 reps Quad Sets: Strengthening, Both, 10 reps Hip ABduction/ADduction: AAROM, Strengthening, Both, 5 reps Straight Leg Raises: AAROM, Strengthening, Both, 5 reps Long Arc Quad: Strengthening, Both, 10 reps Knee Flexion: Strengthening, Both, 10 reps    General Comments        Pertinent Vitals/Pain Pain  Assessment Pain Assessment: No/denies pain    Home Living Family/patient expects to be discharged to:: Group home                   Additional Comments: Pt reports living at home alone but per chart review pt living at group home.    Prior Function            PT Goals (current goals can now be found in the care plan section) Progress towards PT goals: Progressing toward goals    Frequency    Min 1X/week      PT Plan      Co-evaluation              AM-PAC PT "6 Clicks" Mobility   Outcome Measure  Help needed turning from your back to your side while in a flat bed without using bedrails?: None Help needed moving from lying on your back to sitting on the side of a flat bed without using bedrails?: A Lot Help needed moving to and from a bed to a chair (including a wheelchair)?: A Lot Help needed standing up from a chair using your arms (e.g., wheelchair or bedside chair)?: A Lot Help needed to walk in hospital room?: A Lot Help needed climbing 3-5 steps with a railing? : Total 6 Click Score: 13    End of Session Equipment Utilized During Treatment: Gait belt Activity Tolerance: Patient tolerated treatment well Patient left: in chair;with call bell/phone within reach;with chair alarm set Nurse Communication: Mobility status PT Visit Diagnosis: Unsteadiness on feet (R26.81);Muscle weakness (generalized) (M62.81);Difficulty in walking, not elsewhere classified (R26.2)     Time: 1446-1510 PT Time Calculation (min) (ACUTE ONLY): 24 min  Charges:    $Gait Training: 8-22 mins $Therapeutic Exercise: 8-22 mins PT General Charges $$ ACUTE PT VISIT: 1 Visit                     D. Scott Kymora Sciara PT, DPT 03/23/23, 3:43 PM

## 2023-03-23 NOTE — Progress Notes (Signed)
[facility] Progress Note  Patient: [name]    ZOX:[id number]  DOA: 03/04/2023     Date of Service: the patient was seen and examined on 03/23/2023  Chief Complaint  Patient presents with   Fall   Head Injury   Brief hospital course: 72 year old male with past medical history significant for hypertension, bipolar disorder, COPD, schizophrenia and cognitive deficit, presented from group home to emergency department for evaluation of fall.    Patient sustained laceration that was repaired in the ED. Patient is admitted to hospitalist service for evaluation of syncope/dizziness. Patient had imaging studies including CT head, spine did not reveal any acute process.  Baseline cognitive deficits, he need supervision, high risk for fall, not safe to go home. TOC working with APS for [employer], legal guardian.   Assessment and Plan:   ESBL E. coli bacteremia, unknown source 8/25 blood culture growing ESBL E. Coli sensitive to imipenem S/p Meropenem 1 g IV TID, changed to ertapenem 1 g IV daily on 8/30 for total 10 days till 03/24/23 pharmacy consulted for dosing UA negative Bladder scan ruled out urinary retention 8/28 repeat blood cultures NGTD ID consulted, recommended 10 days IV antibiotics. CT A/P: Right perinephric stranding possible pyelonephritis, right renal mass, recommended MRI.  3 cm infrarenal abdominal aortic aneurysm.   Syncope and collapse:  Unsteady gait and fall Positive for orthostatic vitals.  TTE without significant finding.  Has unsteady gait.  Received IV fluid hydration. TOC working with APS, need legal guardian, placement. -Continue fall precaution --repeat orthostatic BP when able --PT/OT rec SNF   Elevated TSH:  TSH elevated to 34, however, free T4 is wnl.  Pt has methimazole on PTA home med list. --Initially started on Synthroid 25 mcg daily which was later increased to 75 mcg daily on 8/23.   Plan: --if pt was taking methimazole for hyperthyroidism PTA,  then instead of starting Synthroid for elevated TSH, can just hold methimazole for now. --d/c'd Synthroid on 8/25 -Repeat TSH and free T4 in 4 weeks.  Order placed to check on 04/05/23   Schizophrenia [facility]): Stable -Continue Celexa Prolixin Risperdal  8/29 psych was consulted again to eval for capacity as per APS request 9/1 seen by psych and stated that patient does not have capacity to make decisions for himself   COPD: Stable   Hyperlipidemia -cont home Zocor    Essential hypertension -hold home amlodipine and Lisinopril for now since BP is wnl to soft, and pt presented with orthostasis.   Isotonic hyponatremia most likely due to poor oral intake Serum osmolality 292 --monitor Na  Constipation, started laxatives.  Right renal mass CT a/p: A 2.1 x 2.5 cm complex hypodense lesion in the inferior pole of the right kidney concerning for neoplasm. Further characterization with MRI without and with contrast recommended. 8/30 d/w Urologist Dr Alvester Morin, [name], rec MRI can be done as an outpatient, not urgent Follow urologist as an outpatient  Abdominal aortic aneurysm, incidental finding on CT scan A 3 cm infrarenal abdominal aortic aneurysm. Recommend follow-up ultrasound every 3 years.  Recommend to follow with PCP for surveillance as an outpatient  Body mass index is 26.14 kg/m.  Interventions:  Diet: Regular diet DVT Prophylaxis: Subcutaneous Lovenox   Advance goals of care discussion: Full code  Family Communication: family was not present at bedside, at the time of interview.  The pt provided permission to discuss medical plan with the family. Opportunity was given to ask question and all questions were answered satisfactorily.  Disposition:  Pt is from group home, admitted with syncope and collapse, found to have bacteremia ESBL E. coli, still on antibiotics. DC plan to rehab, APS involved. Follow TOC for discharge planning.  Subjective: No significant events  overnight, patient was awake and alert, eating breakfast.  Resting comfortably, denied any complaints.  Physical Exam: General: NAD, lying comfortably Appear in no distress, affect appropriate Eyes: PERRLA ENT: Oral Mucosa Clear, moist  Neck: no JVD,  Cardiovascular: S1 and S2 Present, no Murmur,  Respiratory: good respiratory effort, Bilateral Air entry equal and Decreased, no Crackles, no wheezes Abdomen: Bowel Sound present, Soft and no tenderness,  Skin: no rashes Extremities: no Pedal edema, no calf tenderness Neurologic: without any new focal findings Gait not checked due to patient safety concerns  Vitals:   03/22/23 2010 03/23/23 0117 03/23/23 0446 03/23/23 0828  BP: 127/71  126/73 123/67  Pulse: 99  82 77  Resp: 16  16 18   Temp: 98.4 F (36.9 C)  98.4 F (36.9 C) 98.3 F (36.8 C)  TempSrc: Oral  Oral   SpO2: 99%  98% 98%  Weight:  80.3 kg    Height:        Intake/Output Summary (Last 24 hours) at 03/23/2023 1304 Last data filed at 03/23/2023 1017 Gross per 24 hour  Intake 3420 ml  Output 550 ml  Net 2870 ml   Filed Weights   03/21/23 0315 03/22/23 0500 03/23/23 0117  Weight: 77.7 kg 79.9 kg 80.3 kg    Data Reviewed: I have personally reviewed and interpreted daily labs, tele strips, imagings as discussed above. I reviewed all nursing notes, pharmacy notes, vitals, pertinent old records I have discussed plan of care as described above with RN and patient/family.  CBC: Recent Labs  Lab 03/17/23 0822 03/19/23 0823 03/22/23 0829  WBC 6.4 7.3 7.7  HGB 12.0* 11.5* 11.6*  HCT 36.0* 34.4* 36.5*  MCV 89.3 89.6 90.8  PLT 139* 170 238   Basic Metabolic Panel: Recent Labs  Lab 03/17/23 0822 03/19/23 0823 03/22/23 0829  NA 132* 135 134*  K 4.1 4.1 4.0  CL 103 101 103  CO2 26 25 25   GLUCOSE 87 80 81  BUN 19 12 18   CREATININE 0.78 0.76 0.86  CALCIUM 9.0 9.1 9.2    Studies: No results found.  Scheduled Meds:  bisacodyl  10 mg Oral QHS   citalopram   40 mg Oral Daily   docusate sodium  100 mg Oral BID   enoxaparin (LOVENOX) injection  40 mg Subcutaneous QPM   fluPHENAZine  5 mg Oral Daily   polyethylene glycol  17 g Oral BID   risperiDONE  1 mg Oral Daily   simvastatin  20 mg Oral Daily   Continuous Infusions:  [START ON 03/24/2023] ertapenem     PRN Meds: acetaminophen **OR** acetaminophen, bisacodyl, hydrALAZINE, lip balm, ondansetron **OR** ondansetron (ZOFRAN) IV, sodium phosphate  Time spent: 35 minutes  Author: Gillis Santa. MD [facility] 03/23/2023 1:04 PM  To reach On-call, see care teams to locate the attending and reach out to them via [email or web address]. If 7PM-7AM, please contact night-coverage If you still have difficulty reaching the attending provider, please page the [facility] (Director on Call) for [facility] on amion for assistance.

## 2023-03-24 DIAGNOSIS — F200 Paranoid schizophrenia: Secondary | ICD-10-CM | POA: Diagnosis not present

## 2023-03-24 DIAGNOSIS — R55 Syncope and collapse: Secondary | ICD-10-CM | POA: Diagnosis not present

## 2023-03-24 DIAGNOSIS — W19XXXA Unspecified fall, initial encounter: Secondary | ICD-10-CM

## 2023-03-24 DIAGNOSIS — A498 Other bacterial infections of unspecified site: Secondary | ICD-10-CM

## 2023-03-24 DIAGNOSIS — R2681 Unsteadiness on feet: Secondary | ICD-10-CM | POA: Diagnosis not present

## 2023-03-24 LAB — CBC
HCT: 35.5 % — ABNORMAL LOW (ref 39.0–52.0)
Hemoglobin: 11.8 g/dL — ABNORMAL LOW (ref 13.0–17.0)
MCH: 29.7 pg (ref 26.0–34.0)
MCHC: 33.2 g/dL (ref 30.0–36.0)
MCV: 89.4 fL (ref 80.0–100.0)
Platelets: 226 10*3/uL (ref 150–400)
RBC: 3.97 MIL/uL — ABNORMAL LOW (ref 4.22–5.81)
RDW: 13 % (ref 11.5–15.5)
WBC: 8.5 10*3/uL (ref 4.0–10.5)
nRBC: 0 % (ref 0.0–0.2)

## 2023-03-24 LAB — BASIC METABOLIC PANEL
Anion gap: 8 (ref 5–15)
BUN: 20 mg/dL (ref 8–23)
CO2: 25 mmol/L (ref 22–32)
Calcium: 9.2 mg/dL (ref 8.9–10.3)
Chloride: 101 mmol/L (ref 98–111)
Creatinine, Ser: 0.94 mg/dL (ref 0.61–1.24)
GFR, Estimated: >60 mL/min (ref >60)
Glucose, Bld: 85 mg/dL (ref 70–99)
Potassium: 4.2 mmol/L (ref 3.5–5.1)
Sodium: 134 mmol/L — ABNORMAL LOW (ref 135–145)

## 2023-03-24 LAB — GLUCOSE, CAPILLARY: Glucose-Capillary: 88 mg/dL (ref 70–99)

## 2023-03-24 NOTE — Progress Notes (Signed)
Physical Therapy Treatment Patient Details Name: [name] MRN: [id number] DOB: [birth year] Today's Date: 03/24/2023   History of Present Illness Patient is a 72 year old male from group home with unwitnessed fall with possible syncopal episode, striking his head. History of hypertension, hyperlipidemia, COPD, bipolar disorder, and schizophrenia. MRI of brain with no acute intracranial pathology    PT Comments  Pt found in bed, willing to work with therapy and put forth good effort throughout. Pt able to perform STS from variable heights and surfaces this session initially with Mod A but quickly progressed to Min A; verbal and tactile cues provided throughout. Pt able to perform 3 ambulatory bouts with RW at [facility] A for AD  management in room with seated rest break between each bout, verbal and tactile cues provided for AD management, greater step length, and upright posture. Pt seemingly walking slower than prior sessions per documentation, pt reports legs feeling tired. Performed quick LE strength assessment, noting LLE knee extension and hip flexion to be weaker than R side. Pt will benefit from continued PT services upon discharge to safely address deficits listed in patient problem list for decreased caregiver assistance and eventual return to PLOF.    If plan is discharge home, recommend the following: A lot of help with walking and/or transfers;A lot of help with bathing/dressing/bathroom;Help with stairs or ramp for entrance;Assist for transportation;Direct supervision/assist for medications management   Can travel by private vehicle     No  Equipment Recommendations   (TBD at next venue of care)    Recommendations for Other Services       Precautions / Restrictions Precautions Precautions: Fall Restrictions Weight Bearing Restrictions: No     Mobility  Bed Mobility Overal bed mobility: Needs Assistance Bed Mobility: Supine to Sit     Supine to sit: HOB elevated, Min  assist     General bed mobility comments: intermittent min A with LE's,    Transfers Overall transfer level: Needs assistance Equipment used: Rolling walker (2 wheels) Transfers: Sit to/from Stand Sit to Stand: Mod assist, Min assist           General transfer comment: Mod A initial, progressing to Min A with cues    Ambulation/Gait Ambulation/Gait assistance: Min assist Gait Distance (Feet): 8 Feet x3 Assistive device: Rolling walker (2 wheels) Gait Pattern/deviations: Step-to pattern, Decreased step length - right, Decreased step length - left, Decreased stride length, Narrow base of support, Shuffle, Trunk flexed Gait velocity: Decreased     General Gait Details: Very slow, shuflfing gait pattern. chair follow for safety, pt cont. to need Min A to help advance RW when appropriate in tandem with VC's to do so.   Stairs             Wheelchair Mobility     Tilt Bed    Modified Rankin (Stroke Patients Only)       Balance Overall balance assessment: Needs assistance Sitting-balance support: Feet supported, Bilateral upper extremity supported Sitting balance-Leahy Scale: Good     Standing balance support: Bilateral upper extremity supported, During functional activity, Reliant on assistive device for balance Standing balance-Leahy Scale: Poor Standing balance comment: Static; reliant on RW.                            Cognition Arousal: Alert Behavior During Therapy: Flat affect Overall Cognitive Status: History of cognitive impairments - at baseline  Exercises      General Comments        Pertinent Vitals/Pain Pain Assessment Faces Pain Scale: No hurt    Home Living                          Prior Function            PT Goals (current goals can now be found in the care plan section) Progress towards PT goals: Progressing toward goals    Frequency    Min  1X/week      PT Plan      Co-evaluation              AM-PAC PT "6 Clicks" Mobility   Outcome Measure  Help needed turning from your back to your side while in a flat bed without using bedrails?: None Help needed moving from lying on your back to sitting on the side of a flat bed without using bedrails?: A Lot Help needed moving to and from a bed to a chair (including a wheelchair)?: A Lot Help needed standing up from a chair using your arms (e.g., wheelchair or bedside chair)?: A Little Help needed to walk in hospital room?: A Little Help needed climbing 3-5 steps with a railing? : A Lot 6 Click Score: 16    End of Session Equipment Utilized During Treatment: Gait belt Activity Tolerance: Patient tolerated treatment well Patient left: in chair;with call bell/phone within reach;with chair alarm set Nurse Communication: Mobility status, MD notified of pt's LLE weakness and difficulty with gait compared to earlier sessions PT Visit Diagnosis: Unsteadiness on feet (R26.81);Muscle weakness (generalized) (M62.81);Difficulty in walking, not elsewhere classified (R26.2)     Time: 1610-9604 PT Time Calculation (min) (ACUTE ONLY): 34 min  Charges:                            Cecile Sheerer, SPT 03/24/23, 4:26 PM

## 2023-03-24 NOTE — TOC Progression Note (Signed)
Transition of Care [facility]) - Progression Note    Patient Details  Name: [name] MRN: [id number] Date of Birth: 03-27-1951  [facility] CM/SW Contact  Garret Reddish, RN Phone Number: 03/24/2023, 10:23 AM  Clinical Narrative:   I spoke with DSS Social Worker Lakeeta Sifford.  Mrs. Sifford has requested Psyche evaluation.  I have emailed Mrs. Sifford patient's Psyche evaluation.  Mrs. Sifford reports that she will follow back up with me regarding next steps from DSS.    TOC will continue to follow for discharge planning.           Expected Discharge Plan and Services                                               Social Determinants of Health (SDOH) Interventions SDOH Screenings   Food Insecurity: Patient Unable To Answer (03/05/2023)  Housing: Patient Unable To Answer (03/05/2023)  Transportation Needs: Patient Unable To Answer (03/05/2023)  Utilities: Patient Unable To Answer (03/05/2023)  Depression (PHQ2-9): Low Risk  (12/12/2020)  Tobacco Use: High Risk (03/05/2023)    Readmission Risk Interventions     No data to display

## 2023-03-24 NOTE — Progress Notes (Signed)
Progress Note   Patient: [name] ZOX:[id number] DOB: 01-03-1951 DOA: 03/04/2023     9 DOS: the patient was seen and examined on 03/24/2023   Brief hospital course: 72 year old male with past medical history significant for hypertension, bipolar disorder, COPD, schizophrenia and cognitive deficit, presented from group home to emergency department for evaluation of fall.    Patient sustained laceration that was repaired in the ED. Patient is admitted to hospitalist service for evaluation of syncope/dizziness. Patient had imaging studies including CT head, spine did not reveal any acute process.  Baseline cognitive deficits, he need supervision, high risk for fall, not safe to go home. TOC working with APS for [employer], legal guardian. His blood cultures grew ESBL E.coli, got IV meropenem 1 g IV 3 times daily ID advised ertapenem 1 g IV daily for total 10 days until 03/24/2023.  Assessment and Plan: ESBL E. coli bacteremia, unknown source 8/25 blood culture growing ESBL E. Coli sensitive to imipenem S/p Meropenem 1 g IV TID, changed to ertapenem 1 g IV daily on 8/30 for total 10 days till today per ID recs. Pharmacy to dose. UA negative. Bladder scan ruled out urinary retention 8/28 repeat blood cultures NGTD CT A/P: Right perinephric stranding possible pyelonephritis, right renal mass, recommended MRI. 3 cm infrarenal abdominal aortic aneurysm.   Syncope and collapse:  Unsteady gait and fall Positive for orthostatic vitals.  TTE without significant finding.  Has unsteady gait.  Received IV fluid hydration. Continue fall precautions. PT/OT rec SNF. TOC working with APS, need legal guardian, placement.   Elevated TSH:  pt was taking methimazole for hyperthyroidism PTA which is held now. d/c'd Synthroid on 8/25 Repeat TSH and free T4 in 4 weeks.  Order placed to check on 04/05/23   Schizophrenia [name]: Stable Continue Celexa, Prolixin, Risperdal  8/29 psych was consulted again to eval for  capacity as per APS request 9/1 seen by psych and stated that patient does not have capacity to make decisions for himself   COPD:  No exacerbation. Stable   Hyperlipidemia cont Zocor    Essential hypertension Hold home amlodipine and Lisinopril for now since BP is wnl to soft, and pt presented with orthostasis.   Isotonic hyponatremia most likely due to poor oral intake Serum osmolality 292 Na 134 stable.   Constipation,  Continue stool softeners, laxatives PRN.   Right renal mass CT a/p: A 2.1 x 2.5 cm complex hypodense lesion in the inferior pole of the right kidney concerning for neoplasm. Further characterization with MRI without and with contrast recommended. 8/30 d/w Urologist Dr Alvester Morin, [name], rec MRI can be done as an outpatient, not urgent. Follow urologist as an outpatient   Abdominal aortic aneurysm, incidental finding on CT scan A 3 cm infrarenal abdominal aortic aneurysm. Recommend follow-up ultrasound every 3 years.  Recommend to follow with PCP for surveillance as an outpatient   Diet: Regular diet DVT Prophylaxis: Subcutaneous Lovenox       Subjective: Patient is seen and examined today morning.  He is lying comfortably.  Poor historian, more sleepy and does not complain.  Physical Exam: Vitals:   03/24/23 0500 03/24/23 0503 03/24/23 0755 03/24/23 1546  BP:  120/73 118/61 93/74  Pulse:  82 84 93  Resp:  16 18 19   Temp:  98.1 F (36.7 C) 98.5 F (36.9 C) 98.2 F (36.8 C)  TempSrc:  Oral    SpO2:  95% 95% 97%  Weight: 80.6 kg     Height:  General - Elderly African-American male, no apparent distress HEENT - PERRLA, EOMI, atraumatic head, non tender sinuses. Lung -diffuse rhonchi Heart - S1, S2 heard, no murmurs, rubs, trace pedal edema Neuro - Alert, awake, unable to do full neuro exam. Skin - Warm and dry. Data Reviewed:     Latest Ref Rng & Units 03/24/2023    8:27 AM 03/22/2023    8:29 AM 03/19/2023    8:23 AM  CBC  WBC 4.0 - 10.5  K/uL 8.5  7.7  7.3   Hemoglobin 13.0 - 17.0 g/dL 56.3  87.5  64.3   Hematocrit 39.0 - 52.0 % 35.5  36.5  34.4   Platelets 150 - 400 K/uL 226  238  170        Latest Ref Rng & Units 03/24/2023    8:27 AM 03/22/2023    8:29 AM 03/19/2023    8:23 AM  BMP  Glucose 70 - 99 mg/dL 85  81  80   BUN 8 - 23 mg/dL 20  18  12    Creatinine 0.61 - 1.24 mg/dL 3.29  5.18  8.41   Sodium 135 - 145 mmol/L 134  134  135   Potassium 3.5 - 5.1 mmol/L 4.2  4.0  4.1   Chloride 98 - 111 mmol/L 101  103  101   CO2 22 - 32 mmol/L 25  25  25    Calcium 8.9 - 10.3 mg/dL 9.2  9.2  9.1    No results found.  Family Communication: No family, TOC working with APS for guardianship and placement.  Disposition: Status is: Inpatient Remains inpatient appropriate because: safe dc plan  Planned Discharge Destination: Barriers to discharge: he has no capacity. APS involved for guardianship    Time spent: 41 minutes  Author: Marcelino Duster, MD 03/24/2023 4:33 PM  For on call review [email or web address].

## 2023-03-24 NOTE — Plan of Care (Signed)
  Problem: Education: Goal: Knowledge of condition and prescribed therapy will improve Outcome: Progressing   Problem: Cardiac: Goal: Will achieve and/or maintain adequate cardiac output Outcome: Progressing   Problem: Physical Regulation: Goal: Complications related to the disease process, condition or treatment will be avoided or minimized Outcome: Progressing   Problem: Education: Goal: Knowledge of General Education information will improve Description: Including pain rating scale, medication(s)/side effects and non-pharmacologic comfort measures Outcome: Progressing   Problem: Health Behavior/Discharge Planning: Goal: Ability to manage health-related needs will improve Outcome: Progressing   Problem: Clinical Measurements: Goal: Ability to maintain clinical measurements within normal limits will improve Outcome: Progressing Goal: Will remain free from infection Outcome: Progressing Goal: Diagnostic test results will improve Outcome: Progressing Goal: Cardiovascular complication will be avoided Outcome: Progressing   Problem: Activity: Goal: Risk for activity intolerance will decrease Outcome: Progressing   Problem: Nutrition: Goal: Adequate nutrition will be maintained Outcome: Progressing   Problem: Coping: Goal: Level of anxiety will decrease Outcome: Progressing   Problem: Elimination: Goal: Will not experience complications related to bowel motility Outcome: Progressing Goal: Will not experience complications related to urinary retention Outcome: Progressing   Problem: Pain Managment: Goal: General experience of comfort will improve Outcome: Progressing   Problem: Safety: Goal: Ability to remain free from injury will improve Outcome: Progressing   Problem: Skin Integrity: Goal: Risk for impaired skin integrity will decrease Outcome: Progressing

## 2023-03-25 DIAGNOSIS — J449 Chronic obstructive pulmonary disease, unspecified: Secondary | ICD-10-CM | POA: Diagnosis not present

## 2023-03-25 DIAGNOSIS — R7881 Bacteremia: Secondary | ICD-10-CM

## 2023-03-25 DIAGNOSIS — I10 Essential (primary) hypertension: Secondary | ICD-10-CM | POA: Diagnosis not present

## 2023-03-25 DIAGNOSIS — R55 Syncope and collapse: Secondary | ICD-10-CM | POA: Diagnosis not present

## 2023-03-25 LAB — GLUCOSE, CAPILLARY: Glucose-Capillary: 78 mg/dL (ref 70–99)

## 2023-03-25 NOTE — Progress Notes (Signed)
Occupational Therapy Treatment Patient Details Name: [name] MRN: [id number] DOB: 1950/09/11 Today's Date: 03/25/2023   History of present illness Patient is a 72 year old male from group home with unwitnessed fall with possible syncopal episode, striking his head. History of hypertension, hyperlipidemia, COPD, bipolar disorder, and schizophrenia. MRI of brain with no acute intracranial pathology   OT comments  Upon entering the room, pt supine in bed with lights off and sleeping soundly. Pt very lethargic in bed and opens eyes to briefly speak to therapist but then goes back to sleep. Pt rolls with max A and bed placed into trendelenburg and pt does wake briefly to push through B LEs to move towards [facility] but once bed is returned to neutral position he goes back to sleep. Pt unable to remain awake for further OT intervention. Call bell and all needed items within reach.       If plan is discharge home, recommend the following:  A lot of help with walking and/or transfers;A lot of help with bathing/dressing/bathroom;Assistance with cooking/housework;Assist for transportation;Help with stairs or ramp for entrance;Direct supervision/assist for financial management;Direct supervision/assist for medications management   Equipment Recommendations  Other (comment) (defer to next venue of care)       Precautions / Restrictions Precautions Precautions: Fall       Mobility Bed Mobility Overal bed mobility: Needs Assistance Bed Mobility: Rolling Rolling: Max assist              Transfers                   General transfer comment: unable to attempt secondary to lethargy         ADL either performed or assessed with clinical judgement      Cognition Arousal: Lethargic Behavior During Therapy: Flat affect Overall Cognitive Status: History of cognitive impairments - at baseline                                                     Pertinent  Vitals/ Pain       Pain Assessment Pain Assessment: No/denies pain         Frequency  Min 1X/week        Progress Toward Goals  OT Goals(current goals can now be found in the care plan section)  Progress towards OT goals: Progressing toward goals      AM-PAC OT "6 Clicks" Daily Activity     Outcome Measure   Help from another person eating meals?: None Help from another person taking care of personal grooming?: A Little Help from another person toileting, which includes using toliet, bedpan, or urinal?: A Lot Help from another person bathing (including washing, rinsing, drying)?: A Lot Help from another person to put on and taking off regular upper body clothing?: A Lot Help from another person to put on and taking off regular lower body clothing?: A Lot 6 Click Score: 15    End of Session    OT Visit Diagnosis: Unsteadiness on feet (R26.81);Repeated falls (R29.6);Muscle weakness (generalized) (M62.81)   Activity Tolerance Patient tolerated treatment well   Patient Left with call bell/phone within reach;in bed;with bed alarm set   Nurse Communication Mobility status        Time: 4098-1191 OT Time Calculation (min): 9 min  Charges: OT General Charges $OT Visit:  1 Visit OT Treatments $Therapeutic Activity: 8-22 mins  Jackquline Denmark, MS, OTR/L , CBIS ascom [phone]  03/25/23, 12:06 PM

## 2023-03-25 NOTE — Progress Notes (Signed)
Physical Therapy Treatment Patient Details Name: [name] MRN: [id number] DOB: 04-Apr-1951 Today's Date: 03/25/2023   History of Present Illness Patient is a 72 year old male from group home with unwitnessed fall with possible syncopal episode, striking his head. History of hypertension, hyperlipidemia, COPD, bipolar disorder, and schizophrenia. MRI of brain with no acute intracranial pathology    PT Comments  Pt was supine in bed upon arrival. He is awake and alert but requires a lot of encouragement to communicate/talk to author throughout session. Pt continues to nod head yes/no appropriately but is encouraged to speak more. He was able to exit L side of bed, stand 3 x EOB prior to ambulating to doorway of room and return." Im tired." Pt tends to have parkinsonian like gait pattern. He required constant min assist to progress RW with vcs for longer strides/step length. Pt refused siting in recliner and requested to return to bed. Pt will need assistance/supervision at DC for safety.    If plan is discharge home, recommend the following: A lot of help with walking and/or transfers;A lot of help with bathing/dressing/bathroom;Help with stairs or ramp for entrance;Assist for transportation;Direct supervision/assist for medications management     Equipment Recommendations  Other (comment) (Defer to next level of care. May need LTC placement)       Precautions / Restrictions Precautions Precautions: Fall Restrictions Weight Bearing Restrictions: No     Mobility  Bed Mobility Overal bed mobility: Needs Assistance Bed Mobility: Supine to Sit, Sit to Supine  Supine to sit: Used rails, Min assist, Mod assist Sit to supine: Min assist   Transfers Overall transfer level: Needs assistance Equipment used: Rolling walker (2 wheels) Transfers: Sit to/from Stand Sit to Stand: Min assist, Mod assist  General transfer comment: Pt stood 3 x EOB with vcs +  min-mod assist. pt was cued for fwd wt  shift and improved technique. poor carry-over between attempts    Ambulation/Gait Ambulation/Gait assistance: Min assist Gait Distance (Feet): 20 Feet Assistive device: Rolling walker (2 wheels) Gait Pattern/deviations: Step-to pattern, Decreased step length - right, Decreased step length - left, Decreased stride length, Narrow base of support, Shuffle, Trunk flexed Gait velocity: Decreased  General Gait Details: Pt presents with narrow shuffling gait. has freezing if author does not provide constant min assist to progress RW fwd. Parkinsonian like gait pattern.   Balance Overall balance assessment: Needs assistance Sitting-balance support: Feet supported, Bilateral upper extremity supported Sitting balance-Leahy Scale: Good     Standing balance support: Bilateral upper extremity supported, During functional activity, Reliant on assistive device for balance Standing balance-Leahy Scale: Poor     Cognition Arousal: Alert Behavior During Therapy: Flat affect Overall Cognitive Status: History of cognitive impairments - at baseline    General Comments: Pt did talk this session but remains extremely limited in conversation.               Pertinent Vitals/Pain Pain Assessment Pain Assessment: No/denies pain     PT Goals (current goals can now be found in the care plan section) Acute Rehab PT Goals Patient Stated Goal: pt did talk more than last session with Thereasa Parkin however cognition and mostly being non verbal limits session progression. Progress towards PT goals: Progressing toward goals    Frequency    Min 1X/week       AM-PAC PT "6 Clicks" Mobility   Outcome Measure  Help needed turning from your back to your side while in a flat bed without using bedrails?: None  Help needed moving from lying on your back to sitting on the side of a flat bed without using bedrails?: A Lot Help needed moving to and from a bed to a chair (including a wheelchair)?: A Lot Help needed  standing up from a chair using your arms (e.g., wheelchair or bedside chair)?: A Little Help needed to walk in hospital room?: A Lot Help needed climbing 3-5 steps with a railing? : A Lot 6 Click Score: 15    End of Session   Activity Tolerance: Patient tolerated treatment well Patient left: in bed;with call bell/phone within reach;with bed alarm set Nurse Communication: Mobility status PT Visit Diagnosis: Unsteadiness on feet (R26.81);Muscle weakness (generalized) (M62.81);Difficulty in walking, not elsewhere classified (R26.2)     Time: 3474-2595 PT Time Calculation (min) (ACUTE ONLY): 20 min  Charges:    $Gait Training: 8-22 mins PT General Charges $$ ACUTE PT VISIT: 1 Visit                    Jetta Lout PTA 03/25/23, 3:17 PM

## 2023-03-25 NOTE — Progress Notes (Signed)
Progress Note   Patient: [name] [id number] DOB: 04-15-1951 DOA: 03/04/2023     10 DOS: the patient was seen and examined on 03/25/2023   Brief hospital course: 72 year old male with past medical history significant for hypertension, bipolar disorder, COPD, schizophrenia and cognitive deficit, presented from group home to emergency department for evaluation of fall.    Patient sustained laceration that was repaired in the ED. Patient is admitted to hospitalist service for evaluation of syncope/dizziness. Patient had imaging studies including CT head, spine did not reveal any acute process.  Baseline cognitive deficits, he need supervision, high risk for fall, not safe to go home. TOC working with APS for [employer], legal guardian. His blood cultures grew ESBL E.coli, got IV meropenem 1 g IV 3 times daily ID advised ertapenem 1 g IV daily for total 10 days until 03/24/2023.  Assessment and Plan: ESBL E. coli bacteremia, unknown source 8/25 blood culture growing ESBL E. Coli sensitive to imipenem S/p Meropenem 1 g IV TID, changed to ertapenem 1 g IV daily on 8/30 for total 10 days which he finished per ID recs.  UA negative. Bladder scan ruled out urinary retention 8/28 repeat blood cultures NGTD CT A/P: Right perinephric stranding possible pyelonephritis, right renal mass, recommended MRI. 3 cm infrarenal abdominal aortic aneurysm.   Syncope and collapse:  Unsteady gait and fall Positive for orthostatic vitals.  TTE without significant finding.  Has unsteady gait.  Received IV fluid hydration. Continue fall precautions. PT/OT rec SNF. TOC working with APS,  He will need legal guardian as he does not have capacity to take his decisions.   Elevated TSH:  pt was taking methimazole for hyperthyroidism PTA which is held now. Discontinued Synthroid on 8/25 Repeat TSH and free T4 in 4 weeks.  Ordered for 04/05/23   Schizophrenia (HCC): Stable Continue Celexa, Prolixin, Risperdal  8/29 psych  was consulted to eval for capacity as per APS request 9/1 seen by psych and stated that patient does not have capacity to make decisions for himself   COPD:  No exacerbation. Stable   Hyperlipidemia cont Zocor    Essential hypertension Hold home amlodipine and Lisinopril for now since BP is wnl to soft, and pt presented with orthostasis.   Isotonic hyponatremia most likely due to poor oral intake Serum osmolality 292 Na 134 stable.   Constipation,  Continue stool softeners, laxatives PRN.   Right renal mass CT a/p: A 2.1 x 2.5 cm complex hypodense lesion in the inferior pole of the right kidney concerning for neoplasm. Further characterization with MRI without and with contrast recommended. 8/30 d/w Urologist Dr Alvester Morin, [name], rec MRI can be done as an outpatient, not urgent. Follow urologist as an outpatient   Abdominal aortic aneurysm, incidental finding on CT scan A 3 cm infrarenal abdominal aortic aneurysm. Recommend follow-up ultrasound every 3 years.  Recommend to follow with PCP for surveillance as an outpatient   Diet: Regular diet DVT Prophylaxis: Subcutaneous Lovenox       Subjective: Patient is seen and examined today morning.  He denies any complaints. Eats poor. Advised to work with PT, out of bed to chair.  Physical Exam: Vitals:   03/24/23 2115 03/25/23 0500 03/25/23 0517 03/25/23 0826  BP: 125/78  125/65 133/70  Pulse: 81  80 87  Resp: 19  20 18   Temp: 98.1 F (36.7 C)  98.2 F (36.8 C) 98.2 F (36.8 C)  TempSrc: Oral  Oral Oral  SpO2: 97%  94% 96%  Weight:  81.3 kg    Height:       General - Elderly African-American male, no apparent distress HEENT - PERRLA, EOMI, atraumatic head, non tender sinuses. Lung -diffuse rhonchi Heart - S1, S2 heard, no murmurs, rubs, trace pedal edema Neuro - Alert, awake,non focal exam. Unsteady gait. Skin - Warm and dry. Data Reviewed:     Latest Ref Rng & Units 03/24/2023    8:27 AM 03/22/2023    8:29 AM  03/19/2023    8:23 AM  CBC  WBC 4.0 - 10.5 K/uL 8.5  7.7  7.3   Hemoglobin 13.0 - 17.0 g/dL 96.0  45.4  09.8   Hematocrit 39.0 - 52.0 % 35.5  36.5  34.4   Platelets 150 - 400 K/uL 226  238  170        Latest Ref Rng & Units 03/24/2023    8:27 AM 03/22/2023    8:29 AM 03/19/2023    8:23 AM  BMP  Glucose 70 - 99 mg/dL 85  81  80   BUN 8 - 23 mg/dL 20  18  12    Creatinine 0.61 - 1.24 mg/dL 1.19  1.47  8.29   Sodium 135 - 145 mmol/L 134  134  135   Potassium 3.5 - 5.1 mmol/L 4.2  4.0  4.1   Chloride 98 - 111 mmol/L 101  103  101   CO2 22 - 32 mmol/L 25  25  25    Calcium 8.9 - 10.3 mg/dL 9.2  9.2  9.1    No results found.  Family Communication: No family, TOC working with APS for guardianship and placement.  Disposition: Status is: Inpatient Remains inpatient appropriate because: safe dc plan  Planned Discharge Destination: Barriers to discharge: he has no capacity. APS involved for guardianship    Time spent: 40 minutes  Author: Marcelino Duster, MD 03/25/2023 5:12 PM  For on call review [email or web address].

## 2023-03-26 DIAGNOSIS — R55 Syncope and collapse: Secondary | ICD-10-CM | POA: Diagnosis not present

## 2023-03-26 DIAGNOSIS — J449 Chronic obstructive pulmonary disease, unspecified: Secondary | ICD-10-CM | POA: Diagnosis not present

## 2023-03-26 DIAGNOSIS — R7881 Bacteremia: Secondary | ICD-10-CM | POA: Diagnosis not present

## 2023-03-26 DIAGNOSIS — I10 Essential (primary) hypertension: Secondary | ICD-10-CM | POA: Diagnosis not present

## 2023-03-26 LAB — BASIC METABOLIC PANEL
Anion gap: 7 (ref 5–15)
BUN: 19 mg/dL (ref 8–23)
CO2: 25 mmol/L (ref 22–32)
Calcium: 9.2 mg/dL (ref 8.9–10.3)
Chloride: 105 mmol/L (ref 98–111)
Creatinine, Ser: 0.89 mg/dL (ref 0.61–1.24)
GFR, Estimated: >60 mL/min (ref >60)
Glucose, Bld: 84 mg/dL (ref 70–99)
Potassium: 4 mmol/L (ref 3.5–5.1)
Sodium: 137 mmol/L (ref 135–145)

## 2023-03-26 LAB — CBC
HCT: 34.9 % — ABNORMAL LOW (ref 39.0–52.0)
Hemoglobin: 11.6 g/dL — ABNORMAL LOW (ref 13.0–17.0)
MCH: 30.1 pg (ref 26.0–34.0)
MCHC: 33.2 g/dL (ref 30.0–36.0)
MCV: 90.6 fL (ref 80.0–100.0)
Platelets: 204 10*3/uL (ref 150–400)
RBC: 3.85 MIL/uL — ABNORMAL LOW (ref 4.22–5.81)
RDW: 13 % (ref 11.5–15.5)
WBC: 8.4 10*3/uL (ref 4.0–10.5)
nRBC: 0 % (ref 0.0–0.2)

## 2023-03-26 LAB — GLUCOSE, CAPILLARY: Glucose-Capillary: 103 mg/dL — ABNORMAL HIGH (ref 70–99)

## 2023-03-26 NOTE — Progress Notes (Signed)
Progress Note   Patient: [name] MVH:[id number] DOB: 1950-11-27 DOA: 03/04/2023     11 DOS: the patient was seen and examined on 03/26/2023   Brief hospital course: 72 year old male with past medical history significant for hypertension, bipolar disorder, COPD, schizophrenia and cognitive deficit, presented from group home to emergency department for evaluation of fall.    Patient sustained laceration that was repaired in the ED. Patient is admitted to hospitalist service for evaluation of syncope/dizziness. Patient had imaging studies including CT head, spine did not reveal any acute process.  Baseline cognitive deficits, he need supervision, high risk for fall, not safe to go home. TOC working with DSS for [employer], legal guardian. His blood cultures grew ESBL E.coli, got IV meropenem 1 g IV 3 times daily ID advised ertapenem 1 g IV daily for total 10 days until 03/24/2023. He finished antibiotic regimen awaiting legal guardianship, placement.  Assessment and Plan: ESBL E. coli bacteremia, unknown source 8/25 blood culture growing ESBL E. Coli sensitive to imipenem S/p Meropenem 1 g IV TID, changed to ertapenem 1 g IV daily on 8/30 for total 10 days which he finished per ID recs.  UA negative. Bladder scan ruled out urinary retention 8/28 repeat blood cultures NGTD CT A/P: Right perinephric stranding possible pyelonephritis, right renal mass, recommended MRI. 3 cm infrarenal abdominal aortic aneurysm. Abdominal MRI as outpatient per urology recommendations.   Syncope and collapse:  Unsteady gait and fall Positive for orthostatic vitals.  TTE without significant finding.  Has unsteady gait. Continue fall precautions. PT/OT rec SNF. TOC working with DSS, he will need legal guardian as he does not have capacity to take his decisions.   Elevated TSH:  pt was taking methimazole for hyperthyroidism PTA which is held now. Discontinued Synthroid on 8/25 Repeat TSH and free T4 in 4 weeks.   Ordered for 04/05/23   Schizophrenia (HCC): Stable Continue Celexa, Prolixin, Risperdal  8/29 psych was consulted to eval for capacity as per APS request 9/1 seen by psych and stated that patient does not have capacity to make decisions for himself. He will need legal guardianship, DSS involved.   COPD:  No exacerbation. Bronchodilators as needed. He is on room air.   Hyperlipidemia cont Zocor    Essential hypertension Hold home amlodipine and Lisinopril for now since BP is wnl to soft, and pt presented with orthostasis.   Isotonic hyponatremia most likely due to poor oral intake Serum osmolality 292 Na 134 stable.   Constipation,  Continue stool softeners, laxatives PRN.   Right renal mass CT a/p: A 2.1 x 2.5 cm complex hypodense lesion in the inferior pole of the right kidney concerning for neoplasm. Further characterization with MRI without and with contrast recommended. 8/30 d/w Urologist [name], [name], rec MRI can be done as an outpatient, not urgent. Follow urologist as an outpatient   Abdominal aortic aneurysm, incidental finding on CT scan A 3 cm infrarenal abdominal aortic aneurysm. Recommend follow-up ultrasound every 3 years. Recommend to follow with PCP for surveillance as an outpatient   Diet: Regular diet DVT Prophylaxis: Subcutaneous Lovenox       Subjective: Patient is seen and examined today morning.  He denies any complaints. Eats poor. Advised to work with PT, out of bed to chair.  Physical Exam: Vitals:   03/25/23 2101 03/26/23 0500 03/26/23 0529 03/26/23 0854  BP: 132/70  139/74 (!) 145/91  Pulse: 84  90 88  Resp:   18 15  Temp: 98 F (36.7  C)  98.2 F (36.8 C) 98.4 F (36.9 C)  TempSrc: Oral  Oral   SpO2: 95%  92% 95%  Weight:  79.3 kg    Height:       General - Elderly African-American male, no apparent distress HEENT - PERRLA, EOMI, atraumatic head, non tender sinuses. Lung -diffuse rhonchi Heart - S1, S2 heard, no murmurs,  rubs, trace pedal edema Neuro - Alert, awake,non focal exam. Unsteady gait. Skin - Warm and dry. Data Reviewed:     Latest Ref Rng & Units 03/26/2023    8:47 AM 03/24/2023    8:27 AM 03/22/2023    8:29 AM  CBC  WBC 4.0 - 10.5 K/uL 8.4  8.5  7.7   Hemoglobin 13.0 - 17.0 g/dL 16.1  09.6  04.5   Hematocrit 39.0 - 52.0 % 34.9  35.5  36.5   Platelets 150 - 400 K/uL 204  226  238        Latest Ref Rng & Units 03/26/2023    8:47 AM 03/24/2023    8:27 AM 03/22/2023    8:29 AM  BMP  Glucose 70 - 99 mg/dL 84  85  81   BUN 8 - 23 mg/dL 19  20  18    Creatinine 0.61 - 1.24 mg/dL 4.09  8.11  9.14   Sodium 135 - 145 mmol/L 137  134  134   Potassium 3.5 - 5.1 mmol/L 4.0  4.2  4.0   Chloride 98 - 111 mmol/L 105  101  103   CO2 22 - 32 mmol/L 25  25  25    Calcium 8.9 - 10.3 mg/dL 9.2  9.2  9.2    No results found.  Family Communication: No family, TOC working with DSS for guardianship and placement.  Disposition: Status is: Inpatient Remains inpatient appropriate because: safe dc plan  Planned Discharge Destination: Barriers to discharge: he has no capacity. DSS involved need legal guardianship    Time spent: 40 minutes  Author: Marcelino Duster, MD 03/26/2023 1:23 PM  For on call review [email or web address].

## 2023-03-26 NOTE — Progress Notes (Signed)
Physical Therapy Treatment Patient Details Name: [name] MRN: [id number] DOB: Dec 08, 1950 Today's Date: 03/26/2023   History of Present Illness Patient is a 72 year old male from group home with unwitnessed fall with possible syncopal episode, striking his head. History of hypertension, hyperlipidemia, COPD, bipolar disorder, and schizophrenia. MRI of brain with no acute intracranial pathology    PT Comments  Pt received in bed after receiving ADL assist from nursing. Improved ability to transfer to edge of bed and complete sit<>stand transfers this date. Increased gait distance tolerated with support of RW, CGA for safety, and MinA to maneuver RW around objects and assist in forward motion as pt demonstrates Parkinsonian like gait. No LOB, no c/o fatigue or discomfort during session. Pt mostly non-verbal throughout session, but will answer with single word to simple direct questions appropriately. Will continue to progress per POC. Current recommendations remain appropriate.   If plan is discharge home, recommend the following: A little help with walking and/or transfers;A lot of help with bathing/dressing/bathroom;Assistance with cooking/housework;Direct supervision/assist for medications management;Direct supervision/assist for financial management;Assist for transportation;Supervision due to cognitive status   Can travel by private vehicle     Yes  Equipment Recommendations  Other (comment) (Defer to next level of care)    Recommendations for Other Services       Precautions / Restrictions Precautions Precautions: Fall Restrictions Weight Bearing Restrictions: No     Mobility  Bed Mobility Overal bed mobility: Needs Assistance Bed Mobility: Supine to Sit Rolling: Min assist, Used rails     Sit to supine: Min assist   General bed mobility comments: simple single step verbal cues to complete task    Transfers Overall transfer level: Needs assistance Equipment used:  Rolling walker (2 wheels) Transfers: Sit to/from Stand Sit to Stand: Min assist           General transfer comment: Pt able to stand from slightly raised bed on second attempt with MinA    Ambulation/Gait Ambulation/Gait assistance: Contact guard assist, Min assist Gait Distance (Feet): 50 Feet Assistive device: Rolling walker (2 wheels) Gait Pattern/deviations: Step-to pattern, Decreased step length - right, Decreased step length - left, Decreased stride length, Narrow base of support, Shuffle, Trunk flexed Gait velocity: Decreased     General Gait Details: Pt presents with narrow shuffling gait. has freezing if author does not provide constant min assist to progress RW fwd. Parkinsonian like gait pattern.   Stairs             Wheelchair Mobility     Tilt Bed    Modified Rankin (Stroke Patients Only)       Balance Overall balance assessment: Needs assistance Sitting-balance support: Feet supported, Bilateral upper extremity supported Sitting balance-Leahy Scale: Good     Standing balance support: Bilateral upper extremity supported, During functional activity, Reliant on assistive device for balance Standing balance-Leahy Scale: Fair Standing balance comment: Static; reliant on RW.                            Cognition Arousal: Alert Behavior During Therapy: Flat affect Overall Cognitive Status: History of cognitive impairments - at baseline                                 General Comments: Pt did talk this session but remains extremely limited in conversation.        Exercises Total  Joint Exercises Ankle Circles/Pumps: AROM, AAROM, Strengthening, Both, 5 reps, 10 reps Long Arc Quad: Strengthening, Both, 10 reps Knee Flexion: Strengthening, Both, 5 reps    General Comments General comments (skin integrity, edema, etc.): Pt educated on the benefits and role of PT and appeared to understand therapist's explaination.       Pertinent Vitals/Pain Pain Assessment Pain Assessment: No/denies pain    Home Living                          Prior Function            PT Goals (current goals can now be found in the care plan section) Acute Rehab PT Goals Patient Stated Goal: pt did talk more than last session with Thereasa Parkin however cognition and mostly being non verbal limits session progression. Progress towards PT goals: Progressing toward goals    Frequency    Min 1X/week      PT Plan      Co-evaluation              AM-PAC PT "6 Clicks" Mobility   Outcome Measure  Help needed turning from your back to your side while in a flat bed without using bedrails?: None Help needed moving from lying on your back to sitting on the side of a flat bed without using bedrails?: A Little Help needed moving to and from a bed to a chair (including a wheelchair)?: A Little Help needed standing up from a chair using your arms (e.g., wheelchair or bedside chair)?: A Little Help needed to walk in hospital room?: A Little Help needed climbing 3-5 steps with a railing? : A Lot 6 Click Score: 18    End of Session Equipment Utilized During Treatment: Gait belt Activity Tolerance: Patient tolerated treatment well Patient left: in chair;with call bell/phone within reach;with chair alarm set Nurse Communication: Mobility status PT Visit Diagnosis: Unsteadiness on feet (R26.81);Muscle weakness (generalized) (M62.81);Difficulty in walking, not elsewhere classified (R26.2)     Time: 1040-1103 PT Time Calculation (min) (ACUTE ONLY): 23 min  Charges:    $Gait Training: 8-22 mins $Therapeutic Exercise: 8-22 mins PT General Charges $$ ACUTE PT VISIT: 1 Visit                    Zadie Cleverly, PTA  [name] 03/26/2023, 1:21 PM

## 2023-03-27 DIAGNOSIS — R7881 Bacteremia: Secondary | ICD-10-CM | POA: Diagnosis not present

## 2023-03-27 DIAGNOSIS — I10 Essential (primary) hypertension: Secondary | ICD-10-CM | POA: Diagnosis not present

## 2023-03-27 DIAGNOSIS — J449 Chronic obstructive pulmonary disease, unspecified: Secondary | ICD-10-CM | POA: Diagnosis not present

## 2023-03-27 DIAGNOSIS — R55 Syncope and collapse: Secondary | ICD-10-CM | POA: Diagnosis not present

## 2023-03-27 LAB — GLUCOSE, CAPILLARY: Glucose-Capillary: 74 mg/dL (ref 70–99)

## 2023-03-27 NOTE — Progress Notes (Signed)
Progress Note   Patient: [name] YTK:[id number] DOB: 12-16-1950 DOA: 03/04/2023     12 DOS: the patient was seen and examined on 03/27/2023   Brief hospital course: 72 year old male with past medical history significant for hypertension, bipolar disorder, COPD, schizophrenia and cognitive deficit, presented from group home to emergency department for evaluation of fall. Patient sustained laceration that was repaired in the ED. Patient is admitted to hospitalist service for evaluation of syncope/dizziness. Patient had imaging studies which did not reveal any acute process.  Baseline cognitive deficits, he need supervision, high risk for fall, not safe to go home. TOC working with DSS for [employer], legal guardian. His blood cultures grew ESBL E.coli, got IV meropenem 1 g IV 3 times daily ID advised ertapenem 1 g IV daily for total 10 days until 03/24/2023. He finished antibiotic regimen awaiting legal guardianship with DSS and placement.  Assessment and Plan: ESBL E. coli bacteremia, unknown source 8/25 blood culture growing ESBL E. Coli sensitive to imipenem S/p Meropenem 1 g IV TID, changed to ertapenem 1 g IV daily on 8/30 for total 10 days which he finished per ID recs.  8/28 repeat blood cultures NGTD CT A/P: Right perinephric stranding possible pyelonephritis, right renal mass, recommended MRI. 3 cm infrarenal abdominal aortic aneurysm. Abdominal MRI as outpatient per urology recommendations.   Syncope and collapse:  Unsteady gait and fall Positive for orthostatic vitals.  TTE without significant finding.  Has unsteady gait. Continue fall precautions. PT/OT rec SNF. TOC working with DSS, he will need legal guardian as he does not have capacity to take his decisions.   Elevated TSH:  pt was taking methimazole for hyperthyroidism PTA which is held now. Discontinued Synthroid on 8/25. Repeat TSH and free T4 in 4 weeks which is ordered for 04/05/23   Schizophrenia [facility]): Stable Continue  Celexa, Prolixin, Risperdal  8/29 psych was consulted to eval for capacity as per APS request 9/1 seen by psych and stated that patient does not have capacity to make decisions for himself. He will need legal guardianship, DSS involved.   COPD:  No exacerbation. Bronchodilators as needed. He is on room air.   Hyperlipidemia cont Zocor    Essential hypertension Hold home amlodipine and Lisinopril for now since BP is wnl to soft, and pt presented with orthostasis.   Isotonic hyponatremia most likely due to poor oral intake Serum osmolality 292 Na 134 stable.   Constipation,  Continue stool softeners, laxatives PRN.   Right renal mass CT a/p: A 2.1 x 2.5 cm complex hypodense lesion in the inferior pole of the right kidney concerning for neoplasm. Further characterization with MRI without and with contrast recommended. 8/30 d/w Urologist Dr Alvester Morin, [name], rec MRI can be done as an outpatient, not urgent. Follow urologist as an outpatient   Abdominal aortic aneurysm, incidental finding on CT scan A 3 cm infrarenal abdominal aortic aneurysm. Recommend follow-up ultrasound every 3 years. Recommend to follow with PCP for surveillance as an outpatient   Diet: Regular diet DVT Prophylaxis: Subcutaneous Lovenox       Subjective: Patient is seen and examined today morning.  He is sleepign comfortably, did not answer me. No overnight issues noted.  Physical Exam: Vitals:   03/26/23 1601 03/26/23 2144 03/27/23 0508 03/27/23 0820  BP: 109/71 (!) 148/68 (!) 142/76 120/69  Pulse: 77 77 99 67  Resp: 15     Temp: 98.2 F (36.8 C) 98.1 F (36.7 C) 98.2 F (36.8 C) 98.3 F (36.8 C)  TempSrc: Oral     SpO2: 99% 100% 94% 98%  Weight:      Height:       General - Elderly African-American male, no apparent distress HEENT - PERRLA, EOMI, atraumatic head, non tender sinuses. Lung -diffuse rhonchi Heart - S1, S2 heard, no murmurs, rubs, trace pedal edema Neuro - sleeping, unable to do  full neuro exam. Skin - Warm and dry. Data Reviewed:     Latest Ref Rng & Units 03/26/2023    8:47 AM 03/24/2023    8:27 AM 03/22/2023    8:29 AM  CBC  WBC 4.0 - 10.5 K/uL 8.4  8.5  7.7   Hemoglobin 13.0 - 17.0 g/dL 16.1  09.6  04.5   Hematocrit 39.0 - 52.0 % 34.9  35.5  36.5   Platelets 150 - 400 K/uL 204  226  238        Latest Ref Rng & Units 03/26/2023    8:47 AM 03/24/2023    8:27 AM 03/22/2023    8:29 AM  BMP  Glucose 70 - 99 mg/dL 84  85  81   BUN 8 - 23 mg/dL 19  20  18    Creatinine 0.61 - 1.24 mg/dL 4.09  8.11  9.14   Sodium 135 - 145 mmol/L 137  134  134   Potassium 3.5 - 5.1 mmol/L 4.0  4.2  4.0   Chloride 98 - 111 mmol/L 105  101  103   CO2 22 - 32 mmol/L 25  25  25    Calcium 8.9 - 10.3 mg/dL 9.2  9.2  9.2    No results found.  Family Communication: No family, TOC working with DSS for guardianship and placement.  Disposition: Status is: Inpatient Remains inpatient appropriate because: safe dc plan  Planned Discharge Destination: Barriers to discharge: he has no capacity. DSS involved need legal guardianship    Time spent: 38 minutes  Author: Marcelino Duster, MD 03/27/2023 4:10 PM  For on call review [email or web address].

## 2023-03-28 DIAGNOSIS — R7881 Bacteremia: Secondary | ICD-10-CM | POA: Diagnosis not present

## 2023-03-28 DIAGNOSIS — R55 Syncope and collapse: Secondary | ICD-10-CM | POA: Diagnosis not present

## 2023-03-28 DIAGNOSIS — I10 Essential (primary) hypertension: Secondary | ICD-10-CM | POA: Diagnosis not present

## 2023-03-28 DIAGNOSIS — J449 Chronic obstructive pulmonary disease, unspecified: Secondary | ICD-10-CM | POA: Diagnosis not present

## 2023-03-28 LAB — GLUCOSE, CAPILLARY
Glucose-Capillary: 89 mg/dL (ref 70–99)
Glucose-Capillary: 116 mg/dL — ABNORMAL HIGH (ref 70–99)

## 2023-03-28 NOTE — Progress Notes (Signed)
MD order received in [facility] to remove sutures from right eyebrow; 4 sutures removed; skin cleansed with betadine and alcohol prep pad; aread CDI; pt tolerated well

## 2023-03-28 NOTE — Plan of Care (Signed)
  Problem: Education: Goal: Knowledge of condition and prescribed therapy will improve Outcome: Progressing   Problem: Cardiac: Goal: Will achieve and/or maintain adequate cardiac output Outcome: Progressing   Problem: Physical Regulation: Goal: Complications related to the disease process, condition or treatment will be avoided or minimized Outcome: Progressing   Problem: Education: Goal: Knowledge of General Education information will improve Description: Including pain rating scale, medication(s)/side effects and non-pharmacologic comfort measures Outcome: Progressing   Problem: Health Behavior/Discharge Planning: Goal: Ability to manage health-related needs will improve Outcome: Progressing   Problem: Clinical Measurements: Goal: Ability to maintain clinical measurements within normal limits will improve Outcome: Progressing Goal: Will remain free from infection Outcome: Progressing Goal: Diagnostic test results will improve Outcome: Progressing Goal: Cardiovascular complication will be avoided Outcome: Progressing   Problem: Activity: Goal: Risk for activity intolerance will decrease Outcome: Progressing  Oob to bsc to chair to bsc to bed this shift Problem: Nutrition: Goal: Adequate nutrition will be maintained Outcome: Progressing   Problem: Coping: Goal: Level of anxiety will decrease Outcome: Progressing   Problem: Elimination: Goal: Will not experience complications related to bowel motility Outcome: Progressing Type 6 stool in BSC this shift Goal: Will not experience complications related to urinary retention Outcome: Progressing  Voided in BSC this shift x 2 Problem: Pain Managment: Goal: General experience of comfort will improve Outcome: Progressing   Problem: Safety: Goal: Ability to remain free from injury will improve Outcome: Progressing   Problem: Skin Integrity: Goal: Risk for impaired skin integrity will decrease Outcome: Progressing

## 2023-03-28 NOTE — Progress Notes (Signed)
Progress Note   Patient: [name] ZOX:[id number] DOB: 1951-06-25 DOA: 03/04/2023     13 DOS: the patient was seen and examined on 03/28/2023   Brief hospital course: 72 year old male with past medical history significant for hypertension, bipolar disorder, COPD, schizophrenia and cognitive deficit, presented from group home to emergency department for evaluation of fall. Patient sustained laceration that was repaired in the ED. Patient is admitted to hospitalist service for evaluation of syncope/dizziness. Patient had imaging studies which did not reveal any acute process.  Baseline cognitive deficits, he need supervision, high risk for fall, not safe to go home. TOC working with DSS for [employer], legal guardian. His blood cultures grew ESBL E.coli, got IV meropenem 1 g IV 3 times daily ID advised ertapenem 1 g IV daily for total 10 days until 03/24/2023. He finished antibiotic regimen awaiting legal guardianship with DSS and placement.  Assessment and Plan: ESBL E. coli bacteremia, unknown source Finished antibiotics.   Syncope and collapse:  Unsteady gait and fall PT/OT rec SNF. TOC working with DSS, he will need legal guardian as he does not have capacity to take his decisions.   Elevated TSH:  Hold methimazole. Discontinued Synthroid on 8/25.  Repeat TSH and free T4 in 4 weeks which is ordered for 04/05/23   Schizophrenia [facility]): Stable Continue Celexa, Prolixin, Risperdal  8/29 psych was consulted to eval for capacity as per APS request 9/1 seen by psych and stated that patient does not have capacity to make decisions for himself. He will need legal guardianship, DSS involved.   COPD:  No exacerbation. Bronchodilators as needed. He is on room air.   Hyperlipidemia cont Zocor    Essential hypertension Hold home antihypertensives as he was dizzy, orthostatic.   Isotonic hyponatremia most likely due to poor oral intake Na stable now.   Constipation,  Continue stool  softeners, laxatives PRN.   Right renal mass CT a/p: A 2.1 x 2.5 cm complex hypodense lesion in the inferior pole of the right kidney concerning for neoplasm. Further characterization with MRI without and with contrast recommended. 8/30 d/w Urologist Dr Alvester Morin, [name], rec MRI can be done as an outpatient, not urgent. Follow urologist as an outpatient   Abdominal aortic aneurysm, incidental finding on CT scan Recommend to follow with PCP for surveillance as an outpatient   Diet: Regular diet DVT Prophylaxis: Subcutaneous Lovenox       Subjective: Patient is seen and examined today morning.  He is sleeping comfortably, does not answer me. Not eating well. Did not get out of bed.  Physical Exam: Vitals:   03/27/23 2146 03/28/23 0455 03/28/23 0500 03/28/23 0934  BP: (!) 155/69 136/67  115/72  Pulse: 74 78  74  Resp: 18 16  18   Temp: 98 F (36.7 C) 98.6 F (37 C)  98 F (36.7 C)  TempSrc:  Oral    SpO2: 97% 97%  99%  Weight:   79.4 kg   Height:       General - Elderly African-American male, no apparent distress HEENT - PERRLA, EOMI, atraumatic head, non tender sinuses. Lung -diffuse rhonchi Heart - S1, S2 heard, no murmurs, rubs, trace pedal edema Neuro - sleeping, arousable, unable to do full neuro exam. Skin - Warm and dry. Data Reviewed:     Latest Ref Rng & Units 03/26/2023    8:47 AM 03/24/2023    8:27 AM 03/22/2023    8:29 AM  CBC  WBC 4.0 - 10.5 K/uL 8.4  8.5  7.7   Hemoglobin 13.0 - 17.0 g/dL 16.1  09.6  04.5   Hematocrit 39.0 - 52.0 % 34.9  35.5  36.5   Platelets 150 - 400 K/uL 204  226  238        Latest Ref Rng & Units 03/26/2023    8:47 AM 03/24/2023    8:27 AM 03/22/2023    8:29 AM  BMP  Glucose 70 - 99 mg/dL 84  85  81   BUN 8 - 23 mg/dL 19  20  18    Creatinine 0.61 - 1.24 mg/dL 4.09  8.11  9.14   Sodium 135 - 145 mmol/L 137  134  134   Potassium 3.5 - 5.1 mmol/L 4.0  4.2  4.0   Chloride 98 - 111 mmol/L 105  101  103   CO2 22 - 32 mmol/L 25  25  25     Calcium 8.9 - 10.3 mg/dL 9.2  9.2  9.2    No results found.  Family Communication: TOC working with DSS for guardianship and placement.  Disposition: Status is: Inpatient Remains inpatient appropriate because: safe dc plan  Planned Discharge Destination: Barriers to discharge: he has no capacity. DSS involved need legal guardianship    Time spent: 28 minutes  Author: Marcelino Duster, MD 03/28/2023 2:10 PM  For on call review [email or web address].

## 2023-03-29 DIAGNOSIS — R7881 Bacteremia: Secondary | ICD-10-CM | POA: Diagnosis not present

## 2023-03-29 DIAGNOSIS — I10 Essential (primary) hypertension: Secondary | ICD-10-CM | POA: Diagnosis not present

## 2023-03-29 DIAGNOSIS — R55 Syncope and collapse: Secondary | ICD-10-CM | POA: Diagnosis not present

## 2023-03-29 DIAGNOSIS — J449 Chronic obstructive pulmonary disease, unspecified: Secondary | ICD-10-CM | POA: Diagnosis not present

## 2023-03-29 LAB — GLUCOSE, CAPILLARY
Glucose-Capillary: 98 mg/dL (ref 70–99)
Glucose-Capillary: 84 mg/dL (ref 70–99)
Glucose-Capillary: 153 mg/dL — ABNORMAL HIGH (ref 70–99)

## 2023-03-29 NOTE — Progress Notes (Signed)
Occupational Therapy Treatment Patient Details Name: [name] MRN: [id number] DOB: 08/05/1950 Today's Date: 03/29/2023   History of present illness Patient is a 72 year old male from group home with unwitnessed fall with possible syncopal episode, striking his head. History of hypertension, hyperlipidemia, COPD, bipolar disorder, and schizophrenia. MRI of brain with no acute intracranial pathology   OT comments  [name] was seen for OT treatment on this date. Upon arrival to room pt reclined in bed, agreeable to tx. Pt requires MIN A + RW for functional mobility ~15 ft in room. CGA + RW tooth brushing standing sink side, increased assist required for using cup to rinse mouth as pt required BUE support as he fatigued. Pt making good progress toward goals, will continue to follow POC. Discharge recommendation remains appropriate.        If plan is discharge home, recommend the following:  A little help with walking and/or transfers;A little help with bathing/dressing/bathroom;Help with stairs or ramp for entrance;Supervision due to cognitive status   Equipment Recommendations  Other (comment) (RW)    Recommendations for Other Services      Precautions / Restrictions Precautions Precautions: Fall Restrictions Weight Bearing Restrictions: No       Mobility Bed Mobility Overal bed mobility: Modified Independent                  Transfers Overall transfer level: Needs assistance Equipment used: Rolling walker (2 wheels) Transfers: Sit to/from Stand Sit to Stand: Min assist           General transfer comment: improved to CGA + RW with elevated bed height     Balance Overall balance assessment: Needs assistance Sitting-balance support: Single extremity supported, Feet supported Sitting balance-Leahy Scale: Good     Standing balance support: Single extremity supported, During functional activity Standing balance-Leahy Scale: Fair                              ADL either performed or assessed with clinical judgement   ADL Overall ADL's : Needs assistance/impaired                                       General ADL Comments: CGA + RW tooth brushing standing sink side, increased assist required for using cup to rinse mouth as pt required BUE support as he fatigued      Cognition Arousal: Alert Behavior During Therapy: WFL for tasks assessed/performed Overall Cognitive Status: History of cognitive impairments - at baseline                                 General Comments: Answers direct questions with one word responses or nods                   Pertinent Vitals/ Pain       Pain Assessment Pain Assessment: No/denies pain   Frequency  Min 1X/week        Progress Toward Goals  OT Goals(current goals can now be found in the care plan section)  Progress towards OT goals: Progressing toward goals  Acute Rehab OT Goals Patient Stated Goal: to walk Time For Goal Achievement: 04/06/23 Potential to Achieve Goals: Fair ADL Goals Pt Will Perform Grooming: with supervision;standing Pt Will Perform Lower Body Dressing:  with supervision;sit to/from stand Pt Will Transfer to Toilet: with supervision;ambulating Pt Will Perform Toileting - Clothing Manipulation and hygiene: with supervision;sit to/from stand   AM-PAC OT "6 Clicks" Daily Activity     Outcome Measure   Help from another person eating meals?: None Help from another person taking care of personal grooming?: A Little Help from another person toileting, which includes using toliet, bedpan, or urinal?: A Lot Help from another person bathing (including washing, rinsing, drying)?: A Lot Help from another person to put on and taking off regular upper body clothing?: A Little Help from another person to put on and taking off regular lower body clothing?: A Lot 6 Click Score: 16    End of Session Equipment Utilized During Treatment:  Rolling walker (2 wheels)  OT Visit Diagnosis: Unsteadiness on feet (R26.81);Repeated falls (R29.6);Muscle weakness (generalized) (M62.81)   Activity Tolerance Patient tolerated treatment well   Patient Left in bed;with call bell/phone within reach;with bed alarm set   Nurse Communication          Time: 1610-9604 OT Time Calculation (min): 22 min  Charges: OT General Charges $OT Visit: 1 Visit OT Treatments $Self Care/Home Management : 8-22 mins  Kathie Dike, M.S. OTR/L  03/29/23, 3:43 PM  ascom [phone]

## 2023-03-29 NOTE — Progress Notes (Signed)
Progress Note   Patient: [name] XBJ:[id number] DOB: [birth year] DOA: 03/04/2023     14 DOS: the patient was seen and examined on 03/29/2023   Brief hospital course: 72 year old male with past medical history significant for hypertension, bipolar disorder, COPD, schizophrenia and cognitive deficit, presented from group home to emergency department for evaluation of fall. Patient sustained laceration that was repaired in the ED. Patient is admitted to hospitalist service for evaluation of syncope/dizziness. Patient had imaging studies which did not reveal any acute process.  Baseline cognitive deficits, he need supervision, high risk for fall, not safe to go home. TOC working with DSS for [employer], legal guardian. His blood cultures grew ESBL E.coli, got IV meropenem 1 g IV 3 times daily ID advised ertapenem 1 g IV daily for total 10 days until 03/24/2023. He finished antibiotic regimen awaiting legal guardianship with DSS and placement.  Assessment and Plan: ESBL E. coli bacteremia, unknown source Finished antibiotics.   Syncope and collapse:  Unsteady gait and fall Right eyebrow sutures removed 03/28/23. Vital signs can be changed to Q shift per floor protocol. TOC working with DSS, he will need legal guardian as he does not have capacity to take his decisions.   Elevated TSH:  Hold methimazole. Discontinued Synthroid on 8/25.  Repeat TSH and free T4 in 4 weeks which is ordered for 04/05/23   Schizophrenia [facility]): Stable Continue Celexa, Prolixin, Risperdal  8/29 psych was consulted to eval for capacity as per APS request 9/1 seen by psych and stated that patient does not have capacity to make decisions for himself. He will need legal guardianship, DSS involved.   COPD:  No exacerbation. Bronchodilators as needed. He is on room air.   Hyperlipidemia cont Zocor    Essential hypertension Hold home antihypertensives as he was dizzy, orthostatic.   Isotonic hyponatremia most likely  due to poor oral intake Na stable now.   Constipation,  Continue stool softeners, laxatives PRN.   Right renal mass CT a/p: A 2.1 x 2.5 cm complex hypodense lesion in the inferior pole of the right kidney concerning for neoplasm. Further characterization with MRI without and with contrast recommended. 8/30 d/w Urologist Dr Alvester Morin, [name], rec MRI can be done as an outpatient, not urgent. Follow urologist as an outpatient   Abdominal aortic aneurysm, incidental finding on CT scan Recommend to follow with PCP for surveillance as an outpatient   Diet: Regular diet DVT Prophylaxis: Subcutaneous Lovenox       Subjective: Patient is seen and examined today morning.  He is lying comfortably, answers me slowly. Did not eat breakfast.   Physical Exam: Vitals:   03/28/23 2138 03/29/23 0216 03/29/23 0448 03/29/23 0804  BP: 120/75  132/71 (!) 141/69  Pulse: 84  74 70  Resp: 12  12 18   Temp: 99 F (37.2 C)  98.3 F (36.8 C) 98 F (36.7 C)  TempSrc:      SpO2: 98%  96% 98%  Weight:  78.1 kg    Height:       General - Elderly African-American male, no apparent distress HEENT - PERRLA, EOMI, atraumatic head, non tender sinuses. Lung -diffuse rhonchi Heart - S1, S2 heard, no murmurs, rubs, trace pedal edema Neuro - sleeping, arousable, unable to do full neuro exam. Skin - Warm and dry. Data Reviewed:     Latest Ref Rng & Units 03/26/2023    8:47 AM 03/24/2023    8:27 AM 03/22/2023    8:29 AM  CBC  WBC 4.0 - 10.5 K/uL 8.4  8.5  7.7   Hemoglobin 13.0 - 17.0 g/dL 40.9  81.1  91.4   Hematocrit 39.0 - 52.0 % 34.9  35.5  36.5   Platelets 150 - 400 K/uL 204  226  238        Latest Ref Rng & Units 03/26/2023    8:47 AM 03/24/2023    8:27 AM 03/22/2023    8:29 AM  BMP  Glucose 70 - 99 mg/dL 84  85  81   BUN 8 - 23 mg/dL 19  20  18    Creatinine 0.61 - 1.24 mg/dL 7.82  9.56  2.13   Sodium 135 - 145 mmol/L 137  134  134   Potassium 3.5 - 5.1 mmol/L 4.0  4.2  4.0   Chloride 98 - 111 mmol/L  105  101  103   CO2 22 - 32 mmol/L 25  25  25    Calcium 8.9 - 10.3 mg/dL 9.2  9.2  9.2    No results found.  Family Communication: TOC working with DSS for guardianship and placement.  Disposition: Status is: Inpatient Remains inpatient appropriate because: safe dc plan  Planned Discharge Destination: Barriers to discharge: he has no capacity. DSS involved need legal guardianship    Time spent: 29 minutes  Author: Marcelino Duster, MD 03/29/2023 3:14 PM  For on call review [email or web address].

## 2023-03-29 NOTE — Progress Notes (Signed)
Physical Therapy Treatment Patient Details Name: [name] MRN: [id number] DOB: 01/15/1951 Today's Date: 03/29/2023   History of Present Illness Patient is a 72 year old male from group home with unwitnessed fall with possible syncopal episode, striking his head. History of hypertension, hyperlipidemia, COPD, bipolar disorder, and schizophrenia. MRI of brain with no acute intracranial pathology    PT Comments  Pt was pleasant and motivated to participate during the session and put forth good effort throughout. Pt required extra time and effort along with the bed rails during sup to sit but no physical assistance.  Pt did require minimal physical assistance, however, to come to standing from sitting from various height surfaces along with cuing for increased trunk flexion and hand placement.  Pt was generally steady during amb with no overt LOB but did require cuing for general sequencing with the RW and was limited to two bouts of amb of 40 feet followed by 20 feet secondary to fatigue.  Pt reported no adverse symptoms during the session.  Pt will benefit from continued PT services upon discharge to safely address deficits listed in patient problem list for decreased caregiver assistance and eventual return to PLOF.      If plan is discharge home, recommend the following: A little help with walking and/or transfers;A lot of help with bathing/dressing/bathroom;Assistance with cooking/housework;Direct supervision/assist for medications management;Direct supervision/assist for financial management;Assist for transportation;Supervision due to cognitive status   Can travel by private vehicle     Yes  Equipment Recommendations  Other (comment) (TBD at next venue of care)    Recommendations for Other Services       Precautions / Restrictions Precautions Precautions: Fall Restrictions Weight Bearing Restrictions: No     Mobility  Bed Mobility Overal bed mobility: Modified Independent              General bed mobility comments: Min extra time, effort, and use of bed rail during sup to sit but no physical assistance required    Transfers Overall transfer level: Needs assistance Equipment used: Rolling walker (2 wheels) Transfers: Sit to/from Stand Sit to Stand: Min assist           General transfer comment: Mod verbal and tactile cues for increased trunk flexion and hand placement with min assist to come to full upright standing from various height surfaces    Ambulation/Gait Ambulation/Gait assistance: Contact guard assist Gait Distance (Feet): 40 Feet x 1, 20 Feet x 1 Assistive device: Rolling walker (2 wheels) Gait Pattern/deviations: Decreased step length - right, Decreased step length - left, Narrow base of support, Shuffle, Trunk flexed, Step-through pattern Gait velocity: Decreased     General Gait Details: Mod verbal cues for upright posture with increased cadence and step length; pt generally steady with no overt LOB and no adverse symptoms   Stairs             Wheelchair Mobility     Tilt Bed    Modified Rankin (Stroke Patients Only)       Balance Overall balance assessment: Needs assistance Sitting-balance support: Single extremity supported, Feet supported Sitting balance-Leahy Scale: Good     Standing balance support: Bilateral upper extremity supported, During functional activity, Reliant on assistive device for balance Standing balance-Leahy Scale: Fair                              Cognition Arousal: Alert Behavior During Therapy: Flat affect Overall Cognitive Status:  History of cognitive impairments - at baseline                                 General Comments: Answers direct questions with one word responses or nods        Exercises      General Comments        Pertinent Vitals/Pain Pain Assessment Pain Assessment: No/denies pain    Home Living                           Prior Function            PT Goals (current goals can now be found in the care plan section) Progress towards PT goals: Progressing toward goals    Frequency    Min 1X/week      PT Plan      Co-evaluation              AM-PAC PT "6 Clicks" Mobility   Outcome Measure  Help needed turning from your back to your side while in a flat bed without using bedrails?: None Help needed moving from lying on your back to sitting on the side of a flat bed without using bedrails?: A Little Help needed moving to and from a bed to a chair (including a wheelchair)?: A Little Help needed standing up from a chair using your arms (e.g., wheelchair or bedside chair)?: A Little Help needed to walk in hospital room?: A Little Help needed climbing 3-5 steps with a railing? : A Lot 6 Click Score: 18    End of Session Equipment Utilized During Treatment: Gait belt Activity Tolerance: Patient tolerated treatment well Patient left: in chair;with call bell/phone within reach;with chair alarm set;with nursing/sitter in room;Other (comment) (Pt left with CNA for vitals) Nurse Communication: Mobility status PT Visit Diagnosis: Unsteadiness on feet (R26.81);Muscle weakness (generalized) (M62.81);Difficulty in walking, not elsewhere classified (R26.2)     Time: 9528-4132 PT Time Calculation (min) (ACUTE ONLY): 18 min  Charges:    $Gait Training: 8-22 mins PT General Charges $$ ACUTE PT VISIT: 1 Visit                    D. Scott Keyry Irache[place] PT, DPT 03/29/23, 4:32 PM

## 2023-03-29 NOTE — Plan of Care (Signed)
  Problem: Education: Goal: Knowledge of condition and prescribed therapy will improve Outcome: Progressing   Problem: Cardiac: Goal: Will achieve and/or maintain adequate cardiac output Outcome: Progressing   Problem: Physical Regulation: Goal: Complications related to the disease process, condition or treatment will be avoided or minimized Outcome: Progressing   Problem: Education: Goal: Knowledge of General Education information will improve Description: Including pain rating scale, medication(s)/side effects and non-pharmacologic comfort measures Outcome: Progressing   Problem: Health Behavior/Discharge Planning: Goal: Ability to manage health-related needs will improve Outcome: Progressing   Problem: Clinical Measurements: Goal: Ability to maintain clinical measurements within normal limits will improve Outcome: Progressing Goal: Will remain free from infection Outcome: Progressing Goal: Diagnostic test results will improve Outcome: Progressing Goal: Cardiovascular complication will be avoided Outcome: Progressing   Problem: Activity: Goal: Risk for activity intolerance will decrease Outcome: Progressing   Problem: Coping: Goal: Level of anxiety will decrease Outcome: Progressing

## 2023-03-30 ENCOUNTER — Telehealth: Payer: Self-pay

## 2023-03-30 DIAGNOSIS — J449 Chronic obstructive pulmonary disease, unspecified: Secondary | ICD-10-CM | POA: Diagnosis not present

## 2023-03-30 DIAGNOSIS — I10 Essential (primary) hypertension: Secondary | ICD-10-CM | POA: Diagnosis not present

## 2023-03-30 DIAGNOSIS — R7881 Bacteremia: Secondary | ICD-10-CM | POA: Diagnosis not present

## 2023-03-30 DIAGNOSIS — R55 Syncope and collapse: Secondary | ICD-10-CM | POA: Diagnosis not present

## 2023-03-30 NOTE — Progress Notes (Signed)
Progress Note   Patient: [name] LKG:[id number] DOB: 12-26-1950 DOA: 03/04/2023     15 DOS: the patient was seen and examined on 03/30/2023   Brief hospital course: 72 year old male with past medical history significant for hypertension, bipolar disorder, COPD, schizophrenia and cognitive deficit, presented from group home to emergency department for evaluation of fall. Patient sustained laceration that was repaired in the ED. Patient is admitted to hospitalist service for evaluation of syncope/dizziness. Patient had imaging studies which did not reveal any acute process.  Baseline cognitive deficits, he need supervision, high risk for fall, not safe to go home. TOC working with DSS for [employer], legal guardian. His blood cultures grew ESBL E.coli, got IV meropenem 1 g IV 3 times daily ID advised ertapenem 1 g IV daily for total 10 days until 03/24/2023. He finished antibiotic regimen awaiting legal guardianship with DSS and placement.  Assessment and Plan: ESBL E. coli bacteremia, unknown source Finished antibiotics.   Syncope and collapse:  Unsteady gait and fall Resolved. PT follow up. TOC working with DSS, he will need legal guardian as he does not have capacity to take his decisions.   Elevated TSH:  Hold methimazole. Discontinued Synthroid on 8/25.  Repeat TSH and free T4 in 4 weeks which is ordered for 04/05/23   Schizophrenia [facility]): Stable Continue Celexa, Prolixin, Risperdal  8/29 psych was consulted to eval for capacity as per APS request 9/1 seen by psych and stated that patient does not have capacity to make decisions for himself. He will need legal guardianship, DSS involved.   COPD:  No exacerbation. Bronchodilators as needed. He is on room air.   Hyperlipidemia cont Zocor    Essential hypertension Hold home antihypertensives as he was dizzy, orthostatic.   Isotonic hyponatremia most likely due to poor oral intake Na stable now.   Constipation,  Continue stool  softeners, laxatives PRN.   Right renal mass Seen on CT a/p: A 2.1 x 2.5 cm complex hypodense lesion in the inferior pole of the right kidney concerning for neoplasm. Further characterization with MRI without and with contrast recommended. 8/30 d/w Urologist Dr Alvester Morin, [name], rec MRI can be done as an outpatient, not urgent. Follow urologist as an outpatient   Abdominal aortic aneurysm, incidental finding on CT scan Recommend to follow with PCP for surveillance as an outpatient   Diet: Regular diet DVT Prophylaxis: Subcutaneous Lovenox       Subjective: Patient is seen and examined today morning.  He is lying comfortably, denies any complaints. Eating poor. Advised to get out of bed.  Physical Exam: Vitals:   03/30/23 0504 03/30/23 0808 03/30/23 1632 03/30/23 2044  BP: 116/76 121/67 122/67 123/62  Pulse: 74 73 98 74  Resp: 20 16 16 16   Temp: 98.4 F (36.9 C) 98.9 F (37.2 C)  98.6 F (37 C)  TempSrc: Oral Oral  Oral  SpO2: 98% 91% 97% 97%  Weight:      Height:       General - Elderly African-American male, no apparent distress HEENT - PERRLA, EOMI, atraumatic head, non tender sinuses. Lung -diffuse rhonchi Heart - S1, S2 heard, no murmurs, rubs, trace pedal edema Neuro - sleeping, arousable, unable to do full neuro exam. Skin - Warm and dry. Data Reviewed:     Latest Ref Rng & Units 03/26/2023    8:47 AM 03/24/2023    8:27 AM 03/22/2023    8:29 AM  CBC  WBC 4.0 - 10.5 K/uL 8.4  8.5  7.7   Hemoglobin 13.0 - 17.0 g/dL 24.4  01.0  27.2   Hematocrit 39.0 - 52.0 % 34.9  35.5  36.5   Platelets 150 - 400 K/uL 204  226  238        Latest Ref Rng & Units 03/26/2023    8:47 AM 03/24/2023    8:27 AM 03/22/2023    8:29 AM  BMP  Glucose 70 - 99 mg/dL 84  85  81   BUN 8 - 23 mg/dL 19  20  18    Creatinine 0.61 - 1.24 mg/dL 5.36  6.44  0.34   Sodium 135 - 145 mmol/L 137  134  134   Potassium 3.5 - 5.1 mmol/L 4.0  4.2  4.0   Chloride 98 - 111 mmol/L 105  101  103   CO2 22 - 32  mmol/L 25  25  25    Calcium 8.9 - 10.3 mg/dL 9.2  9.2  9.2    No results found.  Family Communication: TOC working with DSS for guardianship and placement.  Disposition: Status is: Inpatient Remains inpatient appropriate because: safe dc plan  Planned Discharge Destination: Barriers to discharge: he has no capacity. DSS involved need legal guardianship    Time spent: 30 minutes  Author: Marcelino Duster, MD 03/30/2023 9:05 PM  For on call review [email or web address].

## 2023-03-30 NOTE — Telephone Encounter (Signed)
Called to reinforce importance of thorough nail care upon pt's d/c to reduce bacteria spread as pt's nails are excessively long. Caregiver verbalized pt would have assist c this upon d/c.   Danelle Earthly, MS, OTR/L

## 2023-03-30 NOTE — Progress Notes (Signed)
Occupational Therapy Treatment Patient Details Name: [name] MRN: [id number] DOB: [birth year] Today's Date: 03/30/2023   History of present illness Patient is a 72 year old male from group home with unwitnessed fall with possible syncopal episode, striking his head. History of hypertension, hyperlipidemia, COPD, bipolar disorder, and schizophrenia. MRI of brain with no acute intracranial pathology   OT comments  OT greeted pt in bed upon arrival and pt agreeable to OOB activities.  Pt tolerated ambulation within room using RW and min guard to reach bathroom toilet.  Toilet transfer with min A on 2 trials, with 1 trial pt being able to have a BM.  Pt required min A for thorough peri care in standing.  Pt tolerated grooming activities with min guard while standing at sink, including oral care, hand hygiene, and nail care, which consisted of using a new toothbrush to scrub under nails d/t excessive visible dirt/food/possibly feces, which was present even prior to pt's BM during OT session.  Hand hygiene very poor; pt's nails are excessively long and visibly dirty under the nails. Pt was unable to communicate who assists with his nail care in the home. OT provided education on importance of good nail care and hygiene to reduce spread of bacteria. OT assisted pt to scrub beneath nails with a toothbrush while standing at sink, and provided verbal cues for thorough hand washing.  Standing tolerance limited to 2-3 min before seated rest break was needed between each of the above grooming activities.  Pt verbalized fatigue in BLEs and noted mild LBP, but was improved with a short 1-2 min seated rest break.  Pt will continue to benefit from skilled OT to maximize safety and indep with daily tasks.  Will continue to follow in the acute setting to work towards OT goals as stated in plan.       If plan is discharge home, recommend the following:  A little help with walking and/or transfers;A little help with  bathing/dressing/bathroom;Help with stairs or ramp for entrance;Supervision due to cognitive status;Direct supervision/assist for medications management   Equipment Recommendations  Other (comment) (RW)    Recommendations for Other Services      Precautions / Restrictions Precautions Precautions: Fall Restrictions Weight Bearing Restrictions: No       Mobility Bed Mobility Overal bed mobility: Modified Independent Bed Mobility: Supine to Sit, Sit to Supine Rolling: Used rails   Supine to sit: Modified independent (Device/Increase time), HOB elevated, Used rails Sit to supine: Modified independent (Device/Increase time)     Patient Response: Cooperative, Flat affect  Transfers Overall transfer level: Needs assistance Equipment used: Rolling walker (2 wheels) Transfers: Sit to/from Stand Sit to Stand: Min assist           General transfer comment: Min A and vc for hand placement for sit to stand from lowered bed.     Balance Overall balance assessment: Needs assistance Sitting-balance support: Single extremity supported, Feet supported Sitting balance-Leahy Scale: Good     Standing balance support: Single extremity supported, Reliant on assistive device for balance Standing balance-Leahy Scale: Fair Standing balance comment: Able stand with single UE support for ADLs with min guard                           ADL either performed or assessed with clinical judgement   ADL Overall ADL's : Needs assistance/impaired     Grooming: Wash/dry hands;Oral care;Contact guard assist;Standing Grooming Details (indicate cue type and  reason): Pt required seated rest breaks between each grooming task noted above, in addition to nail care (washing under nails with a tooth brush)                 Toilet Transfer: Minimal assistance;Rolling walker (2 wheels);Grab bars;Regular Toilet   Toileting- Clothing Manipulation and Hygiene: Minimal assistance;Sit to/from  stand Toileting - Clothing Manipulation Details (indicate cue type and reason): vc and min A for thoroughness to clean after a BM; min guard for standing balance     Functional mobility during ADLs: Contact guard assist;Rolling walker (2 wheels) General ADL Comments: Ambulatory to bathroom for above noted ADLs with min guard and RW.    Extremity/Trunk Assessment Upper Extremity Assessment Upper Extremity Assessment: Overall WFL for tasks assessed   Lower Extremity Assessment Lower Extremity Assessment: Generalized weakness        Vision Patient Visual Report: No change from baseline                Cognition Arousal: Alert Behavior During Therapy: Flat affect Overall Cognitive Status: History of cognitive impairments - at baseline                                 General Comments: Answers direct questions with short responses                     General Comments Hand hygiene very poor; pt's nails are excessively long and visibly dirty under the nails.  Pt was unable to communicate who assisted with his nail care.  OT provided education on importance of good nail care and hygiene to reduce spread of bacteria.  OT assisted pt to scrub beneath nails with a toothbrush while standing at sink, and provided verbal cues for thorough hand washing.    Pertinent Vitals/ Pain       Pain Assessment Pain Assessment: Faces Faces Pain Scale: Hurts a little bit Pain Location: low back with standing ADLs Pain Descriptors / Indicators: Aching, Discomfort Pain Intervention(s): Limited activity within patient's tolerance, Monitored during session, Repositioned  Home Living                                                        Frequency  Min 1X/week        Progress Toward Goals  OT Goals(current goals can now be found in the care plan section)  Progress towards OT goals: Progressing toward goals  Acute Rehab OT Goals Patient Stated  Goal: to walk Time For Goal Achievement: 04/06/23 Potential to Achieve Goals: Fair  Plan                      AM-PAC OT "6 Clicks" Daily Activity     Outcome Measure   Help from another person eating meals?: None Help from another person taking care of personal grooming?: A Little Help from another person toileting, which includes using toliet, bedpan, or urinal?: A Little Help from another person bathing (including washing, rinsing, drying)?: A Lot Help from another person to put on and taking off regular upper body clothing?: A Little Help from another person to put on and taking off regular lower body clothing?: A Lot 6 Click Score: 17  End of Session Equipment Utilized During Treatment: Rolling walker (2 wheels);Gait belt  OT Visit Diagnosis: Unsteadiness on feet (R26.81);Repeated falls (R29.6);Muscle weakness (generalized) (M62.81)   Activity Tolerance Patient tolerated treatment well   Patient Left in bed;with call bell/phone within reach;with bed alarm set   Nurse Communication Mobility status;Other (comment) (pt completed hand hygiene and able to have a BM in toilet)        Time: 0981-1914 OT Time Calculation (min): 35 min  Charges: OT General Charges $OT Visit: 1 Visit OT Treatments $Self Care/Home Management : 23-37 mins  Danelle Earthly, MS, OTR/L  Otis Dials 03/30/2023, 2:15 PM

## 2023-03-30 NOTE — Plan of Care (Signed)
  Problem: Education: Goal: Knowledge of condition and prescribed therapy will improve Outcome: Progressing   Problem: Cardiac: Goal: Will achieve and/or maintain adequate cardiac output Outcome: Progressing   Problem: Physical Regulation: Goal: Complications related to the disease process, condition or treatment will be avoided or minimized Outcome: Progressing   Problem: Education: Goal: Knowledge of General Education information will improve Description: Including pain rating scale, medication(s)/side effects and non-pharmacologic comfort measures Outcome: Progressing   Problem: Health Behavior/Discharge Planning: Goal: Ability to manage health-related needs will improve Outcome: Progressing   Problem: Activity: Goal: Risk for activity intolerance will decrease Outcome: Progressing   Problem: Nutrition: Goal: Adequate nutrition will be maintained Outcome: Progressing   Problem: Coping: Goal: Level of anxiety will decrease Outcome: Progressing   Problem: Elimination: Goal: Will not experience complications related to bowel motility Outcome: Progressing Goal: Will not experience complications related to urinary retention Outcome: Progressing

## 2023-03-30 NOTE — Progress Notes (Signed)
Mobility Specialist - Progress Note   03/30/23 1439  Mobility  Activity Ambulated with assistance in room;Transferred from bed to chair  Level of Assistance Standby assist, set-up cues, supervision of patient - no hands on  Assistive Device Front wheel walker  Distance Ambulated (ft) 8 ft  Activity Response Tolerated well  $Mobility charge 1 Mobility     Pt sleeping in bed upon arrival, utilizing RA. Pt awakened to gentle touch/voice. Pt agreeable to activity. Completed bed mobility with supervision. Took several fwd/retro steps prior to sitting in recliner. Pt left in chair with alarm set, needs in reach and lunch tray set up.    [name] Mobility Specialist 03/30/23, 2:40 PM

## 2023-03-31 DIAGNOSIS — I10 Essential (primary) hypertension: Secondary | ICD-10-CM | POA: Diagnosis not present

## 2023-03-31 DIAGNOSIS — J449 Chronic obstructive pulmonary disease, unspecified: Secondary | ICD-10-CM | POA: Diagnosis not present

## 2023-03-31 DIAGNOSIS — R55 Syncope and collapse: Secondary | ICD-10-CM | POA: Diagnosis not present

## 2023-03-31 DIAGNOSIS — R7881 Bacteremia: Secondary | ICD-10-CM | POA: Diagnosis not present

## 2023-03-31 NOTE — Progress Notes (Signed)
Occupational Therapy Treatment Patient Details Name: [name] MRN: [id number] DOB: 1951/06/20 Today's Date: 03/31/2023   History of present illness Patient is a 72 year old male from group home with unwitnessed fall with possible syncopal episode, striking his head. History of hypertension, hyperlipidemia, COPD, bipolar disorder, and schizophrenia. MRI of brain with no acute intracranial pathology   OT comments  Pt seen for OT tx. Initially pt declining to participate but with encouragement agreeable. Upon pulling back covers, OT noted pt had had a BM. Pt denies awareness. Pt engaged in standing pericare requiring CGA with LUE on RW for stability and set up for washcloths for repeated cleansing. Pt tolerated well. Pt returned to bed afterwards. Progressing towards goals, continues to benefit from skilled OT services.       If plan is discharge home, recommend the following:  A little help with walking and/or transfers;A little help with bathing/dressing/bathroom;Help with stairs or ramp for entrance;Supervision due to cognitive status;Direct supervision/assist for medications management   Equipment Recommendations  Other (comment) (2WW)    Recommendations for Other Services      Precautions / Restrictions Precautions Precautions: Fall Restrictions Weight Bearing Restrictions: No       Mobility Bed Mobility Overal bed mobility: Needs Assistance Bed Mobility: Supine to Sit, Sit to Supine Rolling: Used rails   Supine to sit: Min assist Sit to supine: Modified independent (Device/Increase time)   General bed mobility comments: MIN Afor BLE    Transfers Overall transfer level: Needs assistance Equipment used: Rolling walker (2 wheels) Transfers: Sit to/from Stand Sit to Stand: Contact guard assist, Min assist                 Balance Overall balance assessment: Needs assistance Sitting-balance support: No upper extremity supported, Feet supported Sitting  balance-Leahy Scale: Good     Standing balance support: Single extremity supported, During functional activity, Reliant on assistive device for balance Standing balance-Leahy Scale: Fair Standing balance comment: Able stand with LUE support on walker while completing pericare standing EOB, no LOB                           ADL either performed or assessed with clinical judgement   ADL Overall ADL's : Needs assistance/impaired     Grooming: Sitting;Set up;Wash/dry face;Wash/dry Quarry manager and Hygiene: Sit to/from stand;Set up;Contact guard assist Toileting - Clothing Manipulation Details (indicate cue type and reason): in standing with LUE on RW for stability, pt set up for pericare in standing and able to complete with CGA for standing balance requiring several washcloths for BM            Extremity/Trunk Assessment              Vision       Perception     Praxis      Cognition Arousal: Alert Behavior During Therapy: Flat affect Overall Cognitive Status: History of cognitive impairments - at baseline                                          Exercises      Shoulder Instructions       General Comments      Pertinent Vitals/  Pain       Pain Assessment Pain Assessment: No/denies pain  Home Living                                          Prior Functioning/Environment              Frequency  Min 1X/week        Progress Toward Goals  OT Goals(current goals can now be found in the care plan section)  Progress towards OT goals: Progressing toward goals  Acute Rehab OT Goals Patient Stated Goal: to walk Time For Goal Achievement: 04/06/23 Potential to Achieve Goals: Fair  Plan      Co-evaluation                 AM-PAC OT "6 Clicks" Daily Activity     Outcome Measure   Help from another person eating meals?: None Help from  another person taking care of personal grooming?: A Little Help from another person toileting, which includes using toliet, bedpan, or urinal?: A Little Help from another person bathing (including washing, rinsing, drying)?: A Lot Help from another person to put on and taking off regular upper body clothing?: A Little Help from another person to put on and taking off regular lower body clothing?: A Lot 6 Click Score: 17    End of Session Equipment Utilized During Treatment: Rolling walker (2 wheels)  OT Visit Diagnosis: Unsteadiness on feet (R26.81);Repeated falls (R29.6);Muscle weakness (generalized) (M62.81)   Activity Tolerance Patient tolerated treatment well   Patient Left in bed;with call bell/phone within reach;with bed alarm set   Nurse Communication Patient requests pain meds        Time: 1610-9604 OT Time Calculation (min): 9 min  Charges: OT General Charges $OT Visit: 1 Visit OT Treatments $Self Care/Home Management : 8-22 mins  Arman Filter., MPH, MS, OTR/L ascom [phone] 03/31/23, 4:24 PM

## 2023-03-31 NOTE — Progress Notes (Signed)
Physical Therapy Treatment Patient Details Name: [name] MRN: [id number] DOB: 18-Jun-1951 Today's Date: 03/31/2023   History of Present Illness Patient is a 72 year old male from group home with unwitnessed fall with possible syncopal episode, striking his head. History of hypertension, hyperlipidemia, COPD, bipolar disorder, and schizophrenia. MRI of brain with no acute intracranial pathology    PT Comments  Pt with flat affect but good overall effort with plenty of cuing.  He was able to get to sitting with minimal assist and then stood and with set up got himself cleaned up.  He was willing to do some minimal ambulation in room with CGA and walker use but did not wish to do more than a single loop in the room.  He had no LOBs or overt safety issues, but needed a lot of close cuing and guidance to stay on task.  Pt will benefit from continued PT per POC.   If plan is discharge home, recommend the following: A little help with walking and/or transfers;A lot of help with bathing/dressing/bathroom;Assistance with cooking/housework;Direct supervision/assist for medications management;Direct supervision/assist for financial management;Assist for transportation;Supervision due to cognitive status   Can travel by private vehicle     Yes  Equipment Recommendations   (TBD)    Recommendations for Other Services       Precautions / Restrictions Precautions Precautions: Fall Restrictions Weight Bearing Restrictions: No     Mobility  Bed Mobility Overal bed mobility: Needs Assistance Bed Mobility: Supine to Sit Rolling: Used rails   Supine to sit: Contact guard     General bed mobility comments: Min extra time, effort, and use of bed rail during sup to sit but no physical assistance required Patient Response: Flat affect  Transfers Overall transfer level: Needs assistance Equipment used: Rolling walker (2 wheels) Transfers: Sit to/from Stand Sit to Stand: Min assist            General transfer comment: Min A and vc for hand placement for sit to stand from lowered bed, unable to rise w/o assist on first attempt, minA on second.    Ambulation/Gait Ambulation/Gait assistance: Contact guard assist Gait Distance (Feet): 20 Feet Assistive device: Rolling walker (2 wheels)   Gait velocity: Decreased     General Gait Details: Mod verbal cues for upright posture/AD use with increased cadence and step length; pt generally steady with no overt LOB and no adverse symptoms - relatively quick to fatigue (vitals remained stable) and declines multiple loops in the room   Stairs             Wheelchair Mobility     Tilt Bed Tilt Bed Patient Response: Flat affect  Modified Rankin (Stroke Patients Only)       Balance Overall balance assessment: Needs assistance Sitting-balance support: Single extremity supported, Feet supported Sitting balance-Leahy Scale: Good     Standing balance support: Bilateral upper extremity supported, Single extremity supported Standing balance-Leahy Scale: Fair Standing balance comment: Able stand with single UE support on walker while conducting pericare standing EOB, no LOBs with b/l UEs during dynamic tasks                            Cognition   Behavior During Therapy: Flat affect Overall Cognitive Status: History of cognitive impairments - at baseline  General Comments: Answers direct questions with short responses        Exercises      General Comments General comments (skin integrity, edema, etc.): Pt was soilded on arrival, appeared unware.  Plenty of cuing, but he did not need a lot of assist cleaning up (after set up) while standing EOB.      Pertinent Vitals/Pain Pain Assessment Pain Assessment: No/denies pain    Home Living                          Prior Function            PT Goals (current goals can now be found in the care  plan section) Progress towards PT goals: Progressing toward goals    Frequency    Min 1X/week      PT Plan      Co-evaluation              AM-PAC PT "6 Clicks" Mobility   Outcome Measure  Help needed turning from your back to your side while in a flat bed without using bedrails?: None Help needed moving from lying on your back to sitting on the side of a flat bed without using bedrails?: A Little Help needed moving to and from a bed to a chair (including a wheelchair)?: A Little Help needed standing up from a chair using your arms (e.g., wheelchair or bedside chair)?: A Little Help needed to walk in hospital room?: A Little Help needed climbing 3-5 steps with a railing? : A Lot 6 Click Score: 18    End of Session Equipment Utilized During Treatment: Gait belt Activity Tolerance: Patient tolerated treatment well Patient left: in chair;with call bell/phone within reach;with chair alarm set Nurse Communication: Mobility status PT Visit Diagnosis: Unsteadiness on feet (R26.81);Muscle weakness (generalized) (M62.81);Difficulty in walking, not elsewhere classified (R26.2)     Time: 6644-0347 PT Time Calculation (min) (ACUTE ONLY): 15 min  Charges:    $Therapeutic Activity: 8-22 mins PT General Charges $$ ACUTE PT VISIT: 1 Visit                     Malachi Pro, DPT 03/31/2023, 10:59 AM

## 2023-03-31 NOTE — Care Management Important Message (Signed)
Important Message  Patient Details  Name: [name] MRN: [id number] Date of Birth: 04-24-1951   Medicare Important Message Given:  N/A - LOS <3 / Initial given by admissions     [name] 03/31/2023, 2:38 PM

## 2023-03-31 NOTE — Progress Notes (Signed)
Progress Note   Patient: [name] YNW:[id number] DOB: 11-23-1950 DOA: 03/04/2023     16 DOS: the patient was seen and examined on 03/31/2023   Brief hospital course: 72 year old male with past medical history significant for hypertension, bipolar disorder, COPD, schizophrenia and cognitive deficit, presented from group home to emergency department for evaluation of fall. Patient sustained laceration that was repaired in the ED. Patient is admitted to hospitalist service for evaluation of syncope/dizziness. Patient had imaging studies which did not reveal any acute process.  Baseline cognitive deficits, he need supervision, high risk for fall, not safe to go home. TOC working with DSS for [employer], legal guardian. His blood cultures grew ESBL E.coli, got IV meropenem 1 g IV 3 times daily ID advised ertapenem 1 g IV daily for total 10 days until 03/24/2023. He finished antibiotic regimen awaiting legal guardianship with DSS and placement.  Assessment and Plan: ESBL E. coli bacteremia, unknown source Finished antibiotics. On contact precautions.   Syncope and collapse:  Unsteady gait and fall Resolved. PT follow up. TOC working with DSS, he will need legal guardian as he does not have capacity to take his decisions.   Elevated TSH:  Hold methimazole. Discontinued Synthroid on 8/25.  Repeat TSH and free T4 in 4 weeks which is ordered for 04/05/23   Schizophrenia [facility]): Stable Continue Celexa, Prolixin, Risperdal  8/29 psych was consulted to eval for capacity as per APS request 9/1 seen by psych and stated that patient does not have capacity to make decisions for himself. He will need legal guardianship, DSS involved.   COPD:  No exacerbation. Bronchodilators as needed. He is on room air.   Hyperlipidemia cont Zocor    Essential hypertension Hold home antihypertensives as he was dizzy, orthostatic.   Isotonic hyponatremia most likely due to poor oral intake Na stable now.    Constipation,  Continue stool softeners, laxatives PRN.   Right renal mass Seen on CT a/p: A 2.1 x 2.5 cm complex hypodense lesion in the inferior pole of the right kidney concerning for neoplasm. Further characterization with MRI without and with contrast recommended. 8/30 d/w Urologist Dr Alvester Morin, [name], rec MRI can be done as an outpatient, not urgent. Follow urologist as an outpatient   Abdominal aortic aneurysm, incidental finding on CT scan Recommend to follow with PCP for surveillance as an outpatient   Diet: Regular diet DVT Prophylaxis: Subcutaneous Lovenox       Subjective: Patient is seen and examined today morning.  He is sitting in chair comfortably, ate better. No overnight issues.  Physical Exam: Vitals:   03/30/23 2044 03/31/23 0348 03/31/23 0500 03/31/23 0813  BP: 123/62 (!) 153/78  134/68  Pulse: 74 79  71  Resp: 16 16  18   Temp: 98.6 F (37 C) 99.1 F (37.3 C)  99.2 F (37.3 C)  TempSrc: Oral     SpO2: 97% 97%  96%  Weight:   78.3 kg   Height:       General - Elderly African-American male, no apparent distress HEENT - PERRLA, EOMI, atraumatic head, non tender sinuses. Lung -diffuse rhonchi Heart - S1, S2 heard, no murmurs, rubs, trace pedal edema Neuro - sleeping, arousable, unable to do full neuro exam. Skin - Warm and dry. Data Reviewed:     Latest Ref Rng & Units 03/26/2023    8:47 AM 03/24/2023    8:27 AM 03/22/2023    8:29 AM  CBC  WBC 4.0 - 10.5 K/uL 8.4  8.5  7.7   Hemoglobin 13.0 - 17.0 g/dL 54.0  98.1  19.1   Hematocrit 39.0 - 52.0 % 34.9  35.5  36.5   Platelets 150 - 400 K/uL 204  226  238        Latest Ref Rng & Units 03/26/2023    8:47 AM 03/24/2023    8:27 AM 03/22/2023    8:29 AM  BMP  Glucose 70 - 99 mg/dL 84  85  81   BUN 8 - 23 mg/dL 19  20  18    Creatinine 0.61 - 1.24 mg/dL 4.78  2.95  6.21   Sodium 135 - 145 mmol/L 137  134  134   Potassium 3.5 - 5.1 mmol/L 4.0  4.2  4.0   Chloride 98 - 111 mmol/L 105  101  103   CO2 22 - 32  mmol/L 25  25  25    Calcium 8.9 - 10.3 mg/dL 9.2  9.2  9.2    No results found.  Family Communication: TOC working with DSS for guardianship and placement.  Disposition: Status is: Inpatient Remains inpatient appropriate because: medically ready for discharge awaiting safe dc plan.  Planned Discharge Destination: Barriers to discharge: he has no capacity. DSS involved need legal guardianship    Time spent: 30 minutes  Author: Marcelino Duster, MD 03/31/2023 3:13 PM  For on call review [email or web address].

## 2023-04-01 DIAGNOSIS — R7881 Bacteremia: Secondary | ICD-10-CM | POA: Diagnosis not present

## 2023-04-01 DIAGNOSIS — J449 Chronic obstructive pulmonary disease, unspecified: Secondary | ICD-10-CM | POA: Diagnosis not present

## 2023-04-01 DIAGNOSIS — R55 Syncope and collapse: Secondary | ICD-10-CM | POA: Diagnosis not present

## 2023-04-01 DIAGNOSIS — I10 Essential (primary) hypertension: Secondary | ICD-10-CM | POA: Diagnosis not present

## 2023-04-01 LAB — GLUCOSE, CAPILLARY: Glucose-Capillary: 86 mg/dL (ref 70–99)

## 2023-04-01 NOTE — Progress Notes (Signed)
Progress Note   Patient: [name] ZOX:[id number] DOB: Sep 03, 1950 DOA: 03/04/2023     17 DOS: the patient was seen and examined on 04/01/2023   Brief hospital course: 72 year old male with past medical history significant for hypertension, bipolar disorder, COPD, schizophrenia and cognitive deficit, presented from group home to emergency department for evaluation of fall. Patient sustained laceration that was repaired in the ED. Patient is admitted to hospitalist service for evaluation of syncope/dizziness. Patient had imaging studies which did not reveal any acute process.  Baseline cognitive deficits, he need supervision, high risk for fall, not safe to go home. TOC working with DSS for [employer], legal guardian. His blood cultures grew ESBL E.coli, got IV meropenem 1 g IV 3 times daily ID advised ertapenem 1 g IV daily for total 10 days until 03/24/2023. He finished antibiotic regimen awaiting legal guardianship with DSS and placement.  Assessment and Plan: ESBL E. coli bacteremia, unknown source Finished antibiotics. On contact precautions.   Syncope and collapse:  Unsteady gait and fall Resolved. PT follow up. Out of bed to chair. TOC working with DSS, need legal guardian as he does not have capacity to take his decisions.   Elevated TSH:  Hold methimazole. Discontinued Synthroid on 8/25.  Repeat TSH and free T4 in 4 weeks which is ordered for 04/05/23   Schizophrenia [facility]): Stable Continue Celexa, Prolixin, Risperdal  Patient does not have capacity to make decisions for himself. He will need legal guardianship, DSS involved.   COPD:  No exacerbation. Bronchodilators as needed. He is on room air.   Hyperlipidemia cont Zocor    Essential hypertension Hold home antihypertensives as he was dizzy, orthostatic.   Isotonic hyponatremia most likely due to poor oral intake Na stable now.   Constipation,  Continue stool softeners, laxatives PRN.   Right renal mass CT abdomen/  pelvis with 2.1 x 2.5 cm complex hypodense lesion in the inferior pole of the right kidney concerning for neoplasm. Further characterization with MRI without and with contrast recommended. Urologist  recommended MRI as an outpatient, not urgent. Follow urologist as an outpatient.   Abdominal aortic aneurysm, incidental finding on CT scan Recommend to follow with PCP for surveillance as an outpatient   Diet: Regular diet DVT Prophylaxis: Subcutaneous Lovenox       Subjective: Patient is seen and examined today morning.  He is sleeping comfortably, ate better. No overnight issues. Discussed with RN.  Physical Exam: Vitals:   03/31/23 2139 04/01/23 0458 04/01/23 0500 04/01/23 0750  BP: 135/75 137/76  (!) 152/84  Pulse: 82 75  72  Resp: 20 20  18   Temp: 99.1 F (37.3 C) 98.3 F (36.8 C)  98.7 F (37.1 C)  TempSrc:      SpO2: 97% 96%  96%  Weight:   77.9 kg   Height:       General - Elderly African-American male, no apparent distress HEENT - PERRLA, EOMI, atraumatic head, non tender sinuses. Lung -diffuse rhonchi Heart - S1, S2 heard, no murmurs, rubs, trace pedal edema Neuro - sleeping, arousable, unable to do full neuro exam. Skin - Warm and dry. Data Reviewed:     Latest Ref Rng & Units 03/26/2023    8:47 AM 03/24/2023    8:27 AM 03/22/2023    8:29 AM  CBC  WBC 4.0 - 10.5 K/uL 8.4  8.5  7.7   Hemoglobin 13.0 - 17.0 g/dL 81.1  91.4  78.2   Hematocrit 39.0 - 52.0 % 34.9  35.5  36.5   Platelets 150 - 400 K/uL 204  226  238        Latest Ref Rng & Units 03/26/2023    8:47 AM 03/24/2023    8:27 AM 03/22/2023    8:29 AM  BMP  Glucose 70 - 99 mg/dL 84  85  81   BUN 8 - 23 mg/dL 19  20  18    Creatinine 0.61 - 1.24 mg/dL 2.84  1.32  4.40   Sodium 135 - 145 mmol/L 137  134  134   Potassium 3.5 - 5.1 mmol/L 4.0  4.2  4.0   Chloride 98 - 111 mmol/L 105  101  103   CO2 22 - 32 mmol/L 25  25  25    Calcium 8.9 - 10.3 mg/dL 9.2  9.2  9.2    No results found.  Family Communication:  TOC working with DSS for guardianship and placement.  Disposition: Status is: Inpatient Remains inpatient appropriate because: medically ready for discharge awaiting safe dc plan.  Planned Discharge Destination: Barriers to discharge: he has no capacity. DSS involved need legal guardianship    Time spent: 30 minutes  Author: Marcelino Duster, MD 04/01/2023 4:26 PM  For on call review [email or web address].

## 2023-04-01 NOTE — Care Management Important Message (Signed)
Important Message  Patient Details  Name: [name] MRN: [id number] Date of Birth: 04-Aug-1950   Medicare Important Message Given:  N/A - LOS <3 / Initial given by admissions     Olegario Messier A [place] 04/01/2023, 8:51 AM

## 2023-04-02 DIAGNOSIS — J449 Chronic obstructive pulmonary disease, unspecified: Secondary | ICD-10-CM | POA: Diagnosis not present

## 2023-04-02 DIAGNOSIS — R55 Syncope and collapse: Secondary | ICD-10-CM | POA: Diagnosis not present

## 2023-04-02 DIAGNOSIS — R7881 Bacteremia: Secondary | ICD-10-CM | POA: Diagnosis not present

## 2023-04-02 DIAGNOSIS — I10 Essential (primary) hypertension: Secondary | ICD-10-CM | POA: Diagnosis not present

## 2023-04-02 LAB — GLUCOSE, CAPILLARY: Glucose-Capillary: 89 mg/dL (ref 70–99)

## 2023-04-02 NOTE — Progress Notes (Signed)
PT Cancellation Note  Patient Details Name: [name] MRN: [id number] DOB: February 15, 1951   Cancelled Treatment:     PT attempt. Pt refused. He endorses R side pain. Ask him if he wanted something for pain but he said he did not. He just did not feel like getting up at this time. Acute PT will continue to follow and progress as able per current POC.    Rushie Chestnut 04/02/2023, 4:38 PM

## 2023-04-02 NOTE — Progress Notes (Signed)
Occupational Therapy Treatment Patient Details Name: [name] MRN: [id number] DOB: 11-01-1950 Today's Date: 04/02/2023   History of present illness Patient is a 72 year old male from group home with unwitnessed fall with possible syncopal episode, striking his head. History of hypertension, hyperlipidemia, COPD, bipolar disorder, and schizophrenia. MRI of brain with no acute intracranial pathology   OT comments  Upon entering the room, pt supine in bed and agreeable to OT intervention with encouragement. Pt reports urgent need to urinate and did not feel he could make it to bathroom in time. Pt performing bed mobility without assistance to EOB and use of urinal. Pt stands with min A from EOB and ambulates with RW and CGA to get to sink for grooming tasks in standing. When pt has no UE support he displays R lateral lean in standing requiring min A for balance . Pt then takes several steps along EOB to return to [facility] with min guard for balance and use of RW. Sit >supine without assistance. Call bell and all needed items within reach upon exiting the room.       If plan is discharge home, recommend the following:  A little help with walking and/or transfers;A little help with bathing/dressing/bathroom;Help with stairs or ramp for entrance;Supervision due to cognitive status;Direct supervision/assist for medications management   Equipment Recommendations  Other (comment) (RW)       Precautions / Restrictions Precautions Precautions: Fall       Mobility Bed Mobility Overal bed mobility: Needs Assistance Bed Mobility: Supine to Sit, Sit to Supine Rolling: Used rails   Supine to sit: Contact guard Sit to supine: Contact guard assist        Transfers Overall transfer level: Needs assistance Equipment used: Rolling walker (2 wheels) Transfers: Sit to/from Stand Sit to Stand: Min assist                 Balance Overall balance assessment: Needs assistance Sitting-balance  support: No upper extremity supported, Feet supported Sitting balance-Leahy Scale: Good   Postural control: Right lateral lean Standing balance support: During functional activity, Reliant on assistive device for balance, Bilateral upper extremity supported Standing balance-Leahy Scale: Fair                             ADL either performed or assessed with clinical judgement   ADL Overall ADL's : Needs assistance/impaired     Grooming: Wash/dry face;Wash/dry hands;Standing;Minimal assistance                                      Extremity/Trunk Assessment Upper Extremity Assessment Upper Extremity Assessment: Overall WFL for tasks assessed   Lower Extremity Assessment Lower Extremity Assessment: Generalized weakness;Overall WFL for tasks assessed        Vision Patient Visual Report: No change from baseline            Cognition Arousal: Alert Behavior During Therapy: Flat affect Overall Cognitive Status: History of cognitive impairments - at baseline                                                     Pertinent Vitals/ Pain       Pain Assessment Pain Assessment: No/denies  pain         Frequency  Min 1X/week        Progress Toward Goals  OT Goals(current goals can now be found in the care plan section)  Progress towards OT goals: Progressing toward goals      AM-PAC OT "6 Clicks" Daily Activity     Outcome Measure   Help from another person eating meals?: None Help from another person taking care of personal grooming?: A Little Help from another person toileting, which includes using toliet, bedpan, or urinal?: A Little Help from another person bathing (including washing, rinsing, drying)?: A Lot Help from another person to put on and taking off regular upper body clothing?: A Little Help from another person to put on and taking off regular lower body clothing?: A Lot 6 Click Score: 17    End of  Session Equipment Utilized During Treatment: Rolling walker (2 wheels)  OT Visit Diagnosis: Unsteadiness on feet (R26.81);Repeated falls (R29.6);Muscle weakness (generalized) (M62.81)   Activity Tolerance Patient tolerated treatment well   Patient Left in bed;with call bell/phone within reach;with bed alarm set   Nurse Communication Mobility status        Time: 1610-9604 OT Time Calculation (min): 12 min  Charges: OT General Charges $OT Visit: 1 Visit OT Treatments $Self Care/Home Management : 8-22 mins  Jackquline Denmark, MS, OTR/L , CBIS ascom [phone]  04/02/23, 1:29 PM

## 2023-04-02 NOTE — Progress Notes (Signed)
Progress Note   Patient: [name] ZOX:[id number] DOB: 09-May-1951 DOA: 03/04/2023     18 DOS: the patient was seen and examined on 04/02/2023   Brief hospital course: 72 year old male with past medical history significant for hypertension, bipolar disorder, COPD, schizophrenia and cognitive deficit, presented from group home to emergency department for evaluation of fall. Patient sustained laceration that was repaired in the ED. Patient is admitted to hospitalist service for evaluation of syncope/dizziness. Patient had imaging studies which did not reveal any acute process.  Baseline cognitive deficits, he need supervision, high risk for fall, not safe to go home. TOC working with DSS for [employer], legal guardian. His blood cultures grew ESBL E.coli, got IV meropenem 1 g IV 3 times daily ID advised ertapenem 1 g IV daily for total 10 days until 03/24/2023. He finished antibiotic regimen awaiting legal guardianship with DSS and placement.  Assessment and Plan: ESBL E. coli bacteremia, unknown source Finished antibiotics. On contact precautions.   Syncope and collapse:  Unsteady gait and fall Resolved. PT follow up. Out of bed to chair. TOC working with DSS, need legal guardian as he does not have capacity to take his decisions.   Elevated TSH:  Hold methimazole. Discontinued Synthroid on 8/25.  Repeat TSH and free T4 in 4 weeks which is ordered for 04/05/23   Schizophrenia [facility]): Stable Continue Celexa, Prolixin, Risperdal  Patient does not have capacity to make decisions for himself. He will need legal guardianship, DSS involved.   COPD:  No exacerbation. Bronchodilators as needed. He is on room air.   Hyperlipidemia cont Zocor    Essential hypertension Hold home antihypertensives as he was dizzy, orthostatic.   Isotonic hyponatremia most likely due to poor oral intake Na stable now.   Constipation,  Continue stool softeners, laxatives PRN.   Right renal mass CT abdomen/  pelvis with 2.1 x 2.5 cm complex hypodense lesion in the inferior pole of the right kidney concerning for neoplasm. Further characterization with MRI without and with contrast recommended. Urologist  recommended MRI as an outpatient, not urgent. Follow urologist as an outpatient.   Abdominal aortic aneurysm, incidental finding on CT scan Recommend to follow with PCP for surveillance as an outpatient   Diet: Regular diet DVT Prophylaxis: Subcutaneous Lovenox       Subjective: Patient is seen and examined today morning.  He is sleeping comfortably, ate better. No overnight issues. Discussed with RN.  Physical Exam: Vitals:   04/01/23 2202 04/02/23 0500 04/02/23 0511 04/02/23 0921  BP: 124/61  118/73 112/69  Pulse: 65  65 65  Resp: 19  20 16   Temp: 98.9 F (37.2 C)  98.2 F (36.8 C) 98.6 F (37 C)  TempSrc: Oral     SpO2: 96%  93% 95%  Weight:  78.7 kg    Height:       General - Elderly African-American male, no apparent distress HEENT - PERRLA, EOMI, atraumatic head, non tender sinuses. Lung -diffuse rhonchi Heart - S1, S2 heard, no murmurs, rubs, trace pedal edema Neuro - sleeping, arousable, unable to do full neuro exam. Skin - Warm and dry. Data Reviewed:     Latest Ref Rng & Units 03/26/2023    8:47 AM 03/24/2023    8:27 AM 03/22/2023    8:29 AM  CBC  WBC 4.0 - 10.5 K/uL 8.4  8.5  7.7   Hemoglobin 13.0 - 17.0 g/dL 81.1  91.4  78.2   Hematocrit 39.0 - 52.0 % 34.9  35.5  36.5   Platelets 150 - 400 K/uL 204  226  238        Latest Ref Rng & Units 03/26/2023    8:47 AM 03/24/2023    8:27 AM 03/22/2023    8:29 AM  BMP  Glucose 70 - 99 mg/dL 84  85  81   BUN 8 - 23 mg/dL 19  20  18    Creatinine 0.61 - 1.24 mg/dL 4.09  8.11  9.14   Sodium 135 - 145 mmol/L 137  134  134   Potassium 3.5 - 5.1 mmol/L 4.0  4.2  4.0   Chloride 98 - 111 mmol/L 105  101  103   CO2 22 - 32 mmol/L 25  25  25    Calcium 8.9 - 10.3 mg/dL 9.2  9.2  9.2    No results found.  Family Communication:  TOC working with DSS for guardianship and placement.  Disposition: Status is: Inpatient Remains inpatient appropriate because: medically ready for discharge awaiting safe dc plan.  Planned Discharge Destination: Barriers to discharge: he has no capacity. DSS involved need legal guardianship    Time spent: 30 minutes  Author: Marcelino Duster, MD 04/02/2023 11:30 AM  For on call review [email or web address].

## 2023-04-02 NOTE — Plan of Care (Signed)
  Problem: Education: Goal: Knowledge of condition and prescribed therapy will improve Outcome: Progressing   Problem: Cardiac: Goal: Will achieve and/or maintain adequate cardiac output Outcome: Progressing   Problem: Physical Regulation: Goal: Complications related to the disease process, condition or treatment will be avoided or minimized Outcome: Progressing   Problem: Education: Goal: Knowledge of General Education information will improve Description: Including pain rating scale, medication(s)/side effects and non-pharmacologic comfort measures Outcome: Progressing   Problem: Health Behavior/Discharge Planning: Goal: Ability to manage health-related needs will improve Outcome: Progressing   Problem: Clinical Measurements: Goal: Ability to maintain clinical measurements within normal limits will improve Outcome: Progressing Goal: Will remain free from infection Outcome: Progressing Goal: Diagnostic test results will improve Outcome: Progressing Goal: Cardiovascular complication will be avoided Outcome: Progressing   Problem: Activity: Goal: Risk for activity intolerance will decrease Outcome: Progressing   Problem: Nutrition: Goal: Adequate nutrition will be maintained Outcome: Progressing   Problem: Coping: Goal: Level of anxiety will decrease Outcome: Progressing   Problem: Elimination: Goal: Will not experience complications related to bowel motility Outcome: Progressing Goal: Will not experience complications related to urinary retention Outcome: Progressing   Problem: Pain Managment: Goal: General experience of comfort will improve Outcome: Progressing   Problem: Safety: Goal: Ability to remain free from injury will improve Outcome: Progressing   Problem: Skin Integrity: Goal: Risk for impaired skin integrity will decrease Outcome: Progressing

## 2023-04-03 DIAGNOSIS — R55 Syncope and collapse: Secondary | ICD-10-CM | POA: Diagnosis not present

## 2023-04-03 LAB — GLUCOSE, CAPILLARY: Glucose-Capillary: 86 mg/dL (ref 70–99)

## 2023-04-03 NOTE — Progress Notes (Signed)
[facility] Progress Note  Patient: [name]    ZOX:[id number]  DOA: 03/04/2023     Date of Service: the patient was seen and examined on 04/03/2023  Chief Complaint  Patient presents with   Fall   Head Injury   Brief hospital course: 72 year old male with past medical history significant for hypertension, bipolar disorder, COPD, schizophrenia and cognitive deficit, presented from group home to emergency department for evaluation of fall. Patient sustained laceration that was repaired in the ED. Patient is admitted to hospitalist service for evaluation of syncope/dizziness. Patient had imaging studies which did not reveal any acute process.  Baseline cognitive deficits, he need supervision, high risk for fall, not safe to go home. TOC working with DSS for [employer], legal guardian. His blood cultures grew ESBL E.coli, got IV meropenem 1 g IV 3 times daily ID advised ertapenem 1 g IV daily for total 10 days until 03/24/2023. He finished antibiotic regimen awaiting legal guardianship with DSS and placement.   Assessment and Plan: ESBL E. coli bacteremia, unknown source Finished antibiotics. On contact precautions.   Syncope and collapse:  Unsteady gait and fall Resolved. PT follow up. Out of bed to chair. TOC working with DSS, need legal guardian as he does not have capacity to take his decisions.   Elevated TSH:  Hold methimazole. Discontinued Synthroid on 8/25.  Repeat TSH and free T4 in 4 weeks which is ordered for 04/05/23   Schizophrenia [facility]): Stable Continue Celexa, Prolixin, Risperdal  Patient does not have capacity to make decisions for himself. He will need legal guardianship, DSS involved.   COPD:  No exacerbation. Bronchodilators as needed. He is on room air.   Hyperlipidemia cont Zocor    Essential hypertension Hold home antihypertensives as he was dizzy, orthostatic.   Isotonic hyponatremia most likely due to poor oral intake Na stable now.    Constipation,  Continue stool softeners, laxatives PRN.   Right renal mass CT abdomen/ pelvis with 2.1 x 2.5 cm complex hypodense lesion in the inferior pole of the right kidney concerning for neoplasm. Further characterization with MRI without and with contrast recommended. Urologist  recommended MRI as an outpatient, not urgent. Follow urologist as an outpatient.   Abdominal aortic aneurysm, incidental finding on CT scan Recommend to follow with PCP for surveillance as an outpatient   Body mass index is 25.23 kg/m.  Interventions:  Pressure Injury 03/23/23 Buttocks Left;Right Stage 2 -  Partial thickness loss of dermis presenting as a shallow open injury with a red, pink wound bed without slough. Shallow open wound on buttock (Active)  03/23/23 1124  Location: Buttocks  Location Orientation: Left;Right  Staging: Stage 2 -  Partial thickness loss of dermis presenting as a shallow open injury with a red, pink wound bed without slough.  Wound Description (Comments): Shallow open wound on buttock  Present on Admission:   Dressing Type Foam - Lift dressing to assess site every shift 04/03/23 0945     Diet: Regular diet DVT Prophylaxis: Subcutaneous Lovenox   Advance goals of care discussion: Full code  Family Communication: family was not present at bedside, at the time of interview.  The pt provided permission to discuss medical plan with the family. Opportunity was given to ask question and all questions were answered satisfactorily.   Disposition:  Status is: Inpatient Remains inpatient appropriate because: medically ready for discharge awaiting safe dc plan.  Planned Discharge Destination: Barriers to discharge: he has no capacity. DSS involved need legal guardianship  Subjective: No significant events overnight, patient was sleepy in the morning, he woke up and open his eyes.  Patient denied any complaints, resting comfortably.  Physical Exam: General: NAD, lying  comfortably Appear in no distress, affect appropriate Eyes: PERRLA ENT: Oral Mucosa Clear, moist  Neck: no JVD,  Cardiovascular: S1 and S2 Present, no Murmur,  Respiratory: good respiratory effort, Bilateral Air entry equal and Decreased, no Crackles, no wheezes Abdomen: Bowel Sound present, Soft and no tenderness,  Skin: no rashes Extremities: no Pedal edema, no calf tenderness Neurologic: without any new focal findings Gait not checked due to patient safety concerns  Vitals:   04/03/23 0500 04/03/23 0515 04/03/23 0924 04/03/23 1145  BP:  110/74 118/69 105/63  Pulse:  77 72 72  Resp:  18 12 16   Temp:  98.7 F (37.1 C) 98.6 F (37 C) 98.4 F (36.9 C)  TempSrc:    Oral  SpO2:  98% 94% 96%  Weight: 77.5 kg     Height:        Intake/Output Summary (Last 24 hours) at 04/03/2023 1345 Last data filed at 04/03/2023 1300 Gross per 24 hour  Intake 160 ml  Output 475 ml  Net -315 ml   Filed Weights   04/01/23 0500 04/02/23 0500 04/03/23 0500  Weight: 77.9 kg 78.7 kg 77.5 kg    Data Reviewed: I have personally reviewed and interpreted daily labs, tele strips, imagings as discussed above. I reviewed all nursing notes, pharmacy notes, vitals, pertinent old records I have discussed plan of care as described above with RN and patient/family.  CBC: No results for input(s): "WBC", "NEUTROABS", "HGB", "HCT", "MCV", "PLT" in the last 168 hours. Basic Metabolic Panel: No results for input(s): "NA", "K", "CL", "CO2", "GLUCOSE", "BUN", "CREATININE", "CALCIUM", "MG", "PHOS" in the last 168 hours.  Studies: No results found.  Scheduled Meds:  bisacodyl  10 mg Oral QHS   citalopram  40 mg Oral Daily   docusate sodium  100 mg Oral BID   enoxaparin (LOVENOX) injection  40 mg Subcutaneous QPM   fluPHENAZine  5 mg Oral Daily   polyethylene glycol  17 g Oral BID   risperiDONE  1 mg Oral Daily   simvastatin  20 mg Oral Daily   Continuous Infusions: PRN Meds: acetaminophen **OR**  acetaminophen, bisacodyl, hydrALAZINE, lip balm, ondansetron **OR** ondansetron (ZOFRAN) IV, sodium phosphate  Time spent: 35 minutes  Author: Gillis Santa. MD [facility] 04/03/2023 1:45 PM  To reach On-call, see care teams to locate the attending and reach out to them via [email or web address]. If 7PM-7AM, please contact night-coverage If you still have difficulty reaching the attending provider, please page the [facility] (Director on Call) for [facility] on amion for assistance.

## 2023-04-03 NOTE — Plan of Care (Signed)
  Problem: Education: Goal: Knowledge of condition and prescribed therapy will improve Outcome: Progressing   Problem: Cardiac: Goal: Will achieve and/or maintain adequate cardiac output Outcome: Progressing   Problem: Physical Regulation: Goal: Complications related to the disease process, condition or treatment will be avoided or minimized Outcome: Progressing   Problem: Education: Goal: Knowledge of General Education information will improve Description: Including pain rating scale, medication(s)/side effects and non-pharmacologic comfort measures Outcome: Progressing   Problem: Health Behavior/Discharge Planning: Goal: Ability to manage health-related needs will improve Outcome: Progressing   Problem: Clinical Measurements: Goal: Ability to maintain clinical measurements within normal limits will improve Outcome: Progressing Goal: Will remain free from infection Outcome: Progressing Goal: Diagnostic test results will improve Outcome: Progressing Goal: Cardiovascular complication will be avoided Outcome: Progressing   Problem: Activity: Goal: Risk for activity intolerance will decrease Outcome: Progressing   Problem: Nutrition: Goal: Adequate nutrition will be maintained Outcome: Progressing   Problem: Coping: Goal: Level of anxiety will decrease Outcome: Progressing   Problem: Elimination: Goal: Will not experience complications related to bowel motility Outcome: Progressing Goal: Will not experience complications related to urinary retention Outcome: Progressing   Problem: Pain Managment: Goal: General experience of comfort will improve Outcome: Progressing   Problem: Safety: Goal: Ability to remain free from injury will improve Outcome: Progressing   Problem: Skin Integrity: Goal: Risk for impaired skin integrity will decrease Outcome: Progressing

## 2023-04-04 DIAGNOSIS — R55 Syncope and collapse: Secondary | ICD-10-CM | POA: Diagnosis not present

## 2023-04-04 LAB — GLUCOSE, CAPILLARY
Glucose-Capillary: 91 mg/dL (ref 70–99)
Glucose-Capillary: 78 mg/dL (ref 70–99)
Glucose-Capillary: 105 mg/dL — ABNORMAL HIGH (ref 70–99)
Glucose-Capillary: 101 mg/dL — ABNORMAL HIGH (ref 70–99)

## 2023-04-04 NOTE — Plan of Care (Signed)
  Problem: Education: Goal: Knowledge of condition and prescribed therapy will improve Outcome: Progressing   Problem: Cardiac: Goal: Will achieve and/or maintain adequate cardiac output Outcome: Progressing   Problem: Physical Regulation: Goal: Complications related to the disease process, condition or treatment will be avoided or minimized Outcome: Progressing   Problem: Education: Goal: Knowledge of General Education information will improve Description: Including pain rating scale, medication(s)/side effects and non-pharmacologic comfort measures Outcome: Progressing   Problem: Health Behavior/Discharge Planning: Goal: Ability to manage health-related needs will improve Outcome: Progressing   Problem: Clinical Measurements: Goal: Ability to maintain clinical measurements within normal limits will improve Outcome: Progressing Goal: Will remain free from infection Outcome: Progressing Goal: Diagnostic test results will improve Outcome: Progressing Goal: Cardiovascular complication will be avoided Outcome: Progressing   Problem: Activity: Goal: Risk for activity intolerance will decrease Outcome: Progressing   Problem: Nutrition: Goal: Adequate nutrition will be maintained Outcome: Progressing   Problem: Coping: Goal: Level of anxiety will decrease Outcome: Progressing   Problem: Elimination: Goal: Will not experience complications related to bowel motility Outcome: Progressing Goal: Will not experience complications related to urinary retention Outcome: Progressing   Problem: Pain Managment: Goal: General experience of comfort will improve Outcome: Progressing   Problem: Safety: Goal: Ability to remain free from injury will improve Outcome: Progressing   Problem: Skin Integrity: Goal: Risk for impaired skin integrity will decrease Outcome: Progressing

## 2023-04-04 NOTE — Progress Notes (Signed)
[facility] Progress Note  Patient: [name]    ZOX:[id number]  DOA: 03/04/2023     Date of Service: the patient was seen and examined on 04/04/2023  Chief Complaint  Patient presents with   Fall   Head Injury   Brief hospital course: 72 year old male with past medical history significant for hypertension, bipolar disorder, COPD, schizophrenia and cognitive deficit, presented from group home to emergency department for evaluation of fall. Patient sustained laceration that was repaired in the ED. Patient is admitted to hospitalist service for evaluation of syncope/dizziness. Patient had imaging studies which did not reveal any acute process.  Baseline cognitive deficits, he need supervision, high risk for fall, not safe to go home. TOC working with DSS for [employer], legal guardian. His blood cultures grew ESBL E.coli, got IV meropenem 1 g IV 3 times daily ID advised ertapenem 1 g IV daily for total 10 days until 03/24/2023. He finished antibiotic regimen awaiting legal guardianship with DSS and placement.   Assessment and Plan: ESBL E. coli bacteremia, unknown source Finished antibiotics. On contact precautions.   Syncope and collapse:  Unsteady gait and fall Resolved. PT follow up. Out of bed to chair. TOC working with DSS, need legal guardian as he does not have capacity to take his decisions.   Elevated TSH:  Hold methimazole. Discontinued Synthroid on 8/25.  Repeat TSH and free T4 in 4 weeks which is ordered for 04/05/23   Schizophrenia [facility]): Stable Continue Celexa, Prolixin, Risperdal  Patient does not have capacity to make decisions for himself. He will need legal guardianship, DSS involved.   COPD:  No exacerbation. Bronchodilators as needed. He is on room air.   Hyperlipidemia cont Zocor    Essential hypertension Hold home antihypertensives as he was dizzy, orthostatic. BP stable without antihypertensive medications  Isotonic hyponatremia, resolved  most  likely due to poor oral intake  S/p Na tablets.   Constipation,  Continue stool softeners, laxatives PRN.   Right renal mass CT abdomen/ pelvis with 2.1 x 2.5 cm complex hypodense lesion in the inferior pole of the right kidney concerning for neoplasm. Further characterization with MRI without and with contrast recommended. Urologist  recommended MRI as an outpatient, not urgent. Follow urologist as an outpatient.   Abdominal aortic aneurysm, incidental finding on CT scan Recommend to follow with PCP for surveillance as an outpatient   Body mass index is 25.62 kg/m.  Interventions:  Pressure Injury 03/23/23 Buttocks Left;Right Stage 2 -  Partial thickness loss of dermis presenting as a shallow open injury with a red, pink wound bed without slough. Shallow open wound on buttock (Active)  03/23/23 1124  Location: Buttocks  Location Orientation: Left;Right  Staging: Stage 2 -  Partial thickness loss of dermis presenting as a shallow open injury with a red, pink wound bed without slough.  Wound Description (Comments): Shallow open wound on buttock  Present on Admission:   Dressing Type Foam - Lift dressing to assess site every shift 04/04/23 0830     Diet: Regular diet DVT Prophylaxis: Subcutaneous Lovenox   Advance goals of care discussion: Full code  Family Communication: family was not present at bedside, at the time of interview.  The pt provided permission to discuss medical plan with the family. Opportunity was given to ask question and all questions were answered satisfactorily.   Disposition:  Status is: Inpatient Remains inpatient appropriate because: medically ready for discharge awaiting safe dc plan.  Planned Discharge Destination: Barriers to discharge: he has  no capacity. DSS involved need legal guardianship  Subjective: No significant events overnight, patient was laying comfortably in the bed, denied any complaints.   Physical Exam: General: NAD, lying  comfortably Appear in no distress, affect appropriate Eyes: PERRLA ENT: Oral Mucosa Clear, moist  Neck: no JVD,  Cardiovascular: S1 and S2 Present, no Murmur,  Respiratory: good respiratory effort, Bilateral Air entry equal and Decreased, no Crackles, no wheezes Abdomen: Bowel Sound present, Soft and no tenderness,  Skin: no rashes Extremities: no Pedal edema, no calf tenderness Neurologic: without any new focal findings Gait not checked due to patient safety concerns  Vitals:   04/03/23 2035 04/04/23 0456 04/04/23 0500 04/04/23 0825  BP: 115/62 137/65  129/67  Pulse: 70 65  70  Resp: 17 18  17   Temp: 98.3 F (36.8 C) 98.3 F (36.8 C)  98.5 F (36.9 C)  TempSrc: Oral     SpO2: 98% 97%  97%  Weight:   78.7 kg   Height:        Intake/Output Summary (Last 24 hours) at 04/04/2023 1153 Last data filed at 04/04/2023 0500 Gross per 24 hour  Intake --  Output 1500 ml  Net -1500 ml   Filed Weights   04/02/23 0500 04/03/23 0500 04/04/23 0500  Weight: 78.7 kg 77.5 kg 78.7 kg    Data Reviewed: I have personally reviewed and interpreted daily labs, tele strips, imagings as discussed above. I reviewed all nursing notes, pharmacy notes, vitals, pertinent old records I have discussed plan of care as described above with RN and patient/family.  CBC: No results for input(s): "WBC", "NEUTROABS", "HGB", "HCT", "MCV", "PLT" in the last 168 hours. Basic Metabolic Panel: No results for input(s): "NA", "K", "CL", "CO2", "GLUCOSE", "BUN", "CREATININE", "CALCIUM", "MG", "PHOS" in the last 168 hours.  Studies: No results found.  Scheduled Meds:  bisacodyl  10 mg Oral QHS   citalopram  40 mg Oral Daily   docusate sodium  100 mg Oral BID   enoxaparin (LOVENOX) injection  40 mg Subcutaneous QPM   fluPHENAZine  5 mg Oral Daily   polyethylene glycol  17 g Oral BID   risperiDONE  1 mg Oral Daily   simvastatin  20 mg Oral Daily   Continuous Infusions: PRN Meds: acetaminophen **OR**  acetaminophen, bisacodyl, hydrALAZINE, lip balm, ondansetron **OR** ondansetron (ZOFRAN) IV, sodium phosphate  Time spent: 35 minutes  Author: Gillis Santa. MD [facility] 04/04/2023 11:53 AM  To reach On-call, see care teams to locate the attending and reach out to them via [email or web address]. If 7PM-7AM, please contact night-coverage If you still have difficulty reaching the attending provider, please page the [facility] (Director on Call) for [facility] on amion for assistance.

## 2023-04-05 DIAGNOSIS — R55 Syncope and collapse: Secondary | ICD-10-CM | POA: Diagnosis not present

## 2023-04-05 LAB — GLUCOSE, CAPILLARY: Glucose-Capillary: 86 mg/dL (ref 70–99)

## 2023-04-05 LAB — T4, FREE: Free T4: 1.71 ng/dL — ABNORMAL HIGH (ref 0.61–1.12)

## 2023-04-05 LAB — TSH: TSH: 1.315 u[IU]/mL (ref 0.350–4.500)

## 2023-04-05 MED ORDER — METHIMAZOLE 5 MG PO TABS
15.0000 mg | ORAL_TABLET | Freq: Every day | ORAL | Status: DC
  Administered 2023-04-05 – 2023-04-09 (×5): 15 mg via ORAL
  Filled 2023-04-05 (×6): qty 1

## 2023-04-05 NOTE — Progress Notes (Signed)
Occupational Therapy Treatment Patient Details Name: [name] MRN: [id number] DOB: 12/06/1950 Today's Date: 04/05/2023   History of present illness Patient is a 72 year old male from group home with unwitnessed fall with possible syncopal episode, striking his head. History of hypertension, hyperlipidemia, COPD, bipolar disorder, and schizophrenia. MRI of brain with no acute intracranial pathology   OT comments  Pt seen for OT tx. Pt received in recliner, agreeable to session. Initially endorsing need to use the urinal and preferring to stand to complete. Pt required MINA and VC for hand placement to complete with RW. VC for pulling his feet under him in order to prevent a posterior LOB. Once in standing pt declined need to use the urinal and returned to sitting. Pt declined to complete grooming at sink, agreeable at recliner. Setup for brushing teething and washing face. Pt progressing towards goals, continues to benefit from skilled OT services.       If plan is discharge home, recommend the following:  A little help with walking and/or transfers;A little help with bathing/dressing/bathroom;Help with stairs or ramp for entrance;Supervision due to cognitive status;Direct supervision/assist for medications management   Equipment Recommendations  Other (comment) (RW)    Recommendations for Other Services      Precautions / Restrictions Precautions Precautions: Fall Restrictions Weight Bearing Restrictions: No       Mobility Bed Mobility               General bed mobility comments: NT in recliner at start and end of session    Transfers Overall transfer level: Needs assistance Equipment used: Rolling walker (2 wheels) Transfers: Sit to/from Stand Sit to Stand: Min assist, From elevated surface                 Balance Overall balance assessment: Needs assistance Sitting-balance support: No upper extremity supported, Feet supported Sitting balance-Leahy Scale:  Good     Standing balance support: Reliant on assistive device for balance, Bilateral upper extremity supported, During functional activity Standing balance-Leahy Scale: Fair                             ADL either performed or assessed with clinical judgement   ADL Overall ADL's : Needs assistance/impaired     Grooming: Sitting;Set up;Oral care;Wash/dry face;Supervision/safety Grooming Details (indicate cue type and reason): VC to initiate each grooming task despite visual cues present                 Toilet Transfer: Minimal assistance;Rolling walker (2 wheels) Toilet Transfer Details (indicate cue type and reason): Pt stood from recliner requiring initial MIN A to complete due to slight posterior lean requiring VC for pulling feet back to improve balance. Once in standing, pt denied need to use the urinal and returned to sitting. Pt declined to trial grooming tasks in standing.                Extremity/Trunk Assessment              Vision       Perception     Praxis      Cognition Arousal: Alert Behavior During Therapy: Flat affect Overall Cognitive Status: History of cognitive impairments - at baseline  Exercises      Shoulder Instructions       General Comments      Pertinent Vitals/ Pain       Pain Assessment Pain Assessment: No/denies pain  Home Living                                          Prior Functioning/Environment              Frequency  Min 1X/week        Progress Toward Goals  OT Goals(current goals can now be found in the care plan section)  Progress towards OT goals: Progressing toward goals  Acute Rehab OT Goals Patient Stated Goal: to walk Time For Goal Achievement: 04/19/23 Potential to Achieve Goals: Fair  Plan      Co-evaluation                 AM-PAC OT "6 Clicks" Daily Activity     Outcome Measure    Help from another person eating meals?: None Help from another person taking care of personal grooming?: A Little Help from another person toileting, which includes using toliet, bedpan, or urinal?: A Little Help from another person bathing (including washing, rinsing, drying)?: A Lot Help from another person to put on and taking off regular upper body clothing?: A Little Help from another person to put on and taking off regular lower body clothing?: A Lot 6 Click Score: 17    End of Session Equipment Utilized During Treatment: Rolling walker (2 wheels)  OT Visit Diagnosis: Unsteadiness on feet (R26.81);Repeated falls (R29.6);Muscle weakness (generalized) (M62.81)   Activity Tolerance Patient tolerated treatment well   Patient Left in chair;with call bell/phone within reach;with chair alarm set   Nurse Communication          Time: 1119-1130 OT Time Calculation (min): 11 min  Charges: OT General Charges $OT Visit: 1 Visit OT Treatments $Self Care/Home Management : 8-22 mins  Arman Filter., MPH, MS, OTR/L ascom [phone] 04/05/23, 2:04 PM  Addendum 9/17 8am: Goals reviewed prior date, date for goal completion updated to reflect goals. Arman Filter., MPH, MS, OTR/L ascom [phone] 04/06/23, 8:10 AM

## 2023-04-05 NOTE — Progress Notes (Signed)
Physical Therapy Treatment Patient Details Name: [name] MRN: [id number] DOB: [birth year] Today's Date: 04/05/2023   History of Present Illness Patient is a 72 year old male from group home with unwitnessed fall with possible syncopal episode, striking his head. History of hypertension, hyperlipidemia, COPD, bipolar disorder, and schizophrenia. MRI of brain with no acute intracranial pathology    PT Comments  Pt received in bed, agreed to PT session. Supine to sit with increased time, HOB raised and Supervision. No c/o dizziness or discomfort upon sitting. General flat affect, able to answer questions appropriately and follow simple commands. Upon standing at [facility] with MinA, pt declined increased gait distance due to fatigue. No LOB with short distance gait, able to step backwards safely and lower to bedside chair. Pt positioned to comfort, all needs in reach. Will continue to progress as able.    If plan is discharge home, recommend the following: A little help with walking and/or transfers;A lot of help with bathing/dressing/bathroom;Assistance with cooking/housework;Direct supervision/assist for medications management;Direct supervision/assist for financial management;Assist for transportation;Supervision due to cognitive status   Can travel by private vehicle     Yes  Equipment Recommendations  Other (comment) (TBD at next level of care)    Recommendations for Other Services       Precautions / Restrictions Precautions Precautions: Fall Restrictions Weight Bearing Restrictions: No     Mobility  Bed Mobility Overal bed mobility: Needs Assistance Bed Mobility: Supine to Sit, Sit to Supine     Supine to sit: Supervision, HOB elevated     General bed mobility comments: Able to scoot to EOB without physical assist    Transfers Overall transfer level: Needs assistance Equipment used: Rolling walker (2 wheels) Transfers: Sit to/from Stand Sit to Stand: Min assist, From  elevated surface           General transfer comment: MinA to weight shift forward and come to upright standing    Ambulation/Gait Ambulation/Gait assistance: Contact guard assist Gait Distance (Feet): 5 Feet Assistive device: Rolling walker (2 wheels) Gait Pattern/deviations: Decreased step length - right, Decreased step length - left, Narrow base of support, Shuffle, Step-through pattern Gait velocity: Decreased     General Gait Details: Pt declined further gait distance   Stairs             Wheelchair Mobility     Tilt Bed    Modified Rankin (Stroke Patients Only)       Balance Overall balance assessment: Needs assistance Sitting-balance support: No upper extremity supported, Feet supported Sitting balance-Leahy Scale: Good     Standing balance support: During functional activity, Reliant on assistive device for balance, Bilateral upper extremity supported Standing balance-Leahy Scale: Fair                              Cognition Arousal: Alert Behavior During Therapy: Flat affect Overall Cognitive Status: History of cognitive impairments - at baseline                                 General Comments: Answers direct questions with short responses        Exercises General Exercises - Lower Extremity Ankle Circles/Pumps: AROM, Both, 10 reps, Seated Other Exercises Other Exercises: Visual cues to complete desired exercises    General Comments General comments (skin integrity, edema, etc.): Pt cooperative and pleasant throughout session. Limited by fatigue  Pertinent Vitals/Pain Pain Assessment Pain Assessment: No/denies pain    Home Living                          Prior Function            PT Goals (current goals can now be found in the care plan section) Acute Rehab PT Goals Patient Stated Goal: pt did talk more than last session with Thereasa Parkin however cognition and mostly being non verbal limits  session progression.    Frequency    Min 1X/week      PT Plan      Co-evaluation              AM-PAC PT "6 Clicks" Mobility   Outcome Measure  Help needed turning from your back to your side while in a flat bed without using bedrails?: None Help needed moving from lying on your back to sitting on the side of a flat bed without using bedrails?: A Little Help needed moving to and from a bed to a chair (including a wheelchair)?: A Little Help needed standing up from a chair using your arms (e.g., wheelchair or bedside chair)?: A Little Help needed to walk in hospital room?: A Little Help needed climbing 3-5 steps with a railing? : A Lot 6 Click Score: 18    End of Session Equipment Utilized During Treatment: Gait belt Activity Tolerance: Patient limited by fatigue Patient left: in chair;with call bell/phone within reach;with chair alarm set Nurse Communication: Mobility status PT Visit Diagnosis: Unsteadiness on feet (R26.81);Muscle weakness (generalized) (M62.81);Difficulty in walking, not elsewhere classified (R26.2)     Time: 1610-9604 PT Time Calculation (min) (ACUTE ONLY): 21 min  Charges:    $Therapeutic Activity: 8-22 mins PT General Charges $$ ACUTE PT VISIT: 1 Visit                    Zadie Cleverly, PTA  [name] 04/05/2023, 9:55 AM

## 2023-04-05 NOTE — Progress Notes (Addendum)
[facility] Progress Note  Patient: [name]    QIO:[id number]  DOA: 03/04/2023     Date of Service: the patient was seen and examined on 04/05/2023  Chief Complaint  Patient presents with   Fall   Head Injury   Brief hospital course: 72 year old male with past medical history significant for hypertension, bipolar disorder, COPD, schizophrenia and cognitive deficit, presented from group home to emergency department for evaluation of fall. Patient sustained laceration that was repaired in the ED. Patient is admitted to hospitalist service for evaluation of syncope/dizziness. Patient had imaging studies which did not reveal any acute process.  Baseline cognitive deficits, he need supervision, high risk for fall, not safe to go home. TOC working with DSS for [employer], legal guardian. His blood cultures grew ESBL E.coli, got IV meropenem 1 g IV 3 times daily ID advised ertapenem 1 g IV daily for total 10 days until 03/24/2023. He finished antibiotic regimen awaiting legal guardianship with DSS and placement.   Assessment and Plan: ESBL E. coli bacteremia, unknown source Finished antibiotics. On contact precautions.   Syncope and collapse:  Unsteady gait and fall Resolved. PT follow up. Out of bed to chair. TOC working with DSS, need legal guardian as he does not have capacity to take his decisions.  Hyperthyroidism 9/16 TSH 1.3 wnl, free T4 level 1.7 elevated 9/16 started methimazole 15 mg p.o. daily Follow free T3 levels Repeat free T4 daily until within normal range    Schizophrenia (HCC): Stable Continue Celexa, Prolixin, Risperdal  Patient does not have capacity to make decisions for himself. He will need legal guardianship, DSS involved.   COPD:  No exacerbation. Bronchodilators as needed. He is on room air.   Hyperlipidemia cont Zocor    Essential hypertension Hold home antihypertensives as he was dizzy, orthostatic. BP stable without antihypertensive  medications  Isotonic hyponatremia, resolved  most likely due to poor oral intake  S/p Na tablets.   Constipation,  Continue stool softeners, laxatives PRN.   Right renal mass CT abdomen/ pelvis with 2.1 x 2.5 cm complex hypodense lesion in the inferior pole of the right kidney concerning for neoplasm. Further characterization with MRI without and with contrast recommended. Urologist  recommended MRI as an outpatient, not urgent. Follow urologist as an outpatient.   Abdominal aortic aneurysm, incidental finding on CT scan Recommend to follow with PCP for surveillance as an outpatient   Body mass index is 25.33 kg/m.  Interventions:  Pressure Injury 03/23/23 Buttocks Left;Right Stage 2 -  Partial thickness loss of dermis presenting as a shallow open injury with a red, pink wound bed without slough. Shallow open wound on buttock (Active)  03/23/23 1124  Location: Buttocks  Location Orientation: Left;Right  Staging: Stage 2 -  Partial thickness loss of dermis presenting as a shallow open injury with a red, pink wound bed without slough.  Wound Description (Comments): Shallow open wound on buttock  Present on Admission:   Dressing Type Foam - Lift dressing to assess site every shift 04/04/23 2055     Diet: Regular diet DVT Prophylaxis: Subcutaneous Lovenox   Advance goals of care discussion: Full code  Family Communication: family was not present at bedside, at the time of interview.  The pt provided permission to discuss medical plan with the family. Opportunity was given to ask question and all questions were answered satisfactorily.   Disposition:  Status is: Inpatient Remains inpatient appropriate because: medically ready for discharge awaiting safe dc plan.  Planned Discharge  Destination: Barriers to discharge: he has no capacity. DSS involved need legal guardianship  Subjective: No significant events overnight, patient is more awake and alert today, he was sitting in the  recliner comfortably.  Denied any complaints.   Physical Exam: General: NAD, lying comfortably Appear in no distress, affect appropriate Eyes: PERRLA ENT: Oral Mucosa Clear, moist  Neck: no JVD,  Cardiovascular: S1 and S2 Present, no Murmur,  Respiratory: good respiratory effort, Bilateral Air entry equal and Decreased, no Crackles, no wheezes Abdomen: Bowel Sound present, Soft and no tenderness,  Skin: no rashes Extremities: no Pedal edema, no calf tenderness Neurologic: without any new focal findings Gait not checked due to patient safety concerns  Vitals:   04/04/23 2123 04/05/23 0500 04/05/23 0540 04/05/23 0843  BP: (!) 145/87  126/63 120/60  Pulse: 67  71 68  Resp:    19  Temp: 98.8 F (37.1 C)  98.5 F (36.9 C) 98.5 F (36.9 C)  TempSrc: Oral  Oral Oral  SpO2: 100%  97% 95%  Weight:  77.8 kg    Height:        Intake/Output Summary (Last 24 hours) at 04/05/2023 1508 Last data filed at 04/05/2023 1455 Gross per 24 hour  Intake 360 ml  Output 175 ml  Net 185 ml   Filed Weights   04/03/23 0500 04/04/23 0500 04/05/23 0500  Weight: 77.5 kg 78.7 kg 77.8 kg    Data Reviewed: I have personally reviewed and interpreted daily labs, tele strips, imagings as discussed above. I reviewed all nursing notes, pharmacy notes, vitals, pertinent old records I have discussed plan of care as described above with RN and patient/family.  CBC: No results for input(s): "WBC", "NEUTROABS", "HGB", "HCT", "MCV", "PLT" in the last 168 hours. Basic Metabolic Panel: No results for input(s): "NA", "K", "CL", "CO2", "GLUCOSE", "BUN", "CREATININE", "CALCIUM", "MG", "PHOS" in the last 168 hours.  Studies: No results found.  Scheduled Meds:  bisacodyl  10 mg Oral QHS   citalopram  40 mg Oral Daily   docusate sodium  100 mg Oral BID   enoxaparin (LOVENOX) injection  40 mg Subcutaneous QPM   fluPHENAZine  5 mg Oral Daily   polyethylene glycol  17 g Oral BID   risperiDONE  1 mg Oral Daily    simvastatin  20 mg Oral Daily   Continuous Infusions: PRN Meds: acetaminophen **OR** acetaminophen, bisacodyl, hydrALAZINE, lip balm, ondansetron **OR** ondansetron (ZOFRAN) IV, sodium phosphate  Time spent: 35 minutes  Author: Gillis Santa. MD [facility] 04/05/2023 3:08 PM  To reach On-call, see care teams to locate the attending and reach out to them via [email or web address]. If 7PM-7AM, please contact night-coverage If you still have difficulty reaching the attending provider, please page the [facility] (Director on Call) for [facility] on amion for assistance.

## 2023-04-06 DIAGNOSIS — J449 Chronic obstructive pulmonary disease, unspecified: Secondary | ICD-10-CM | POA: Diagnosis not present

## 2023-04-06 DIAGNOSIS — I10 Essential (primary) hypertension: Secondary | ICD-10-CM | POA: Diagnosis not present

## 2023-04-06 DIAGNOSIS — R55 Syncope and collapse: Secondary | ICD-10-CM | POA: Diagnosis not present

## 2023-04-06 DIAGNOSIS — R7881 Bacteremia: Secondary | ICD-10-CM | POA: Diagnosis not present

## 2023-04-06 LAB — T4, FREE: Free T4: 1.81 ng/dL — ABNORMAL HIGH (ref 0.61–1.12)

## 2023-04-06 LAB — GLUCOSE, CAPILLARY
Glucose-Capillary: 79 mg/dL (ref 70–99)
Glucose-Capillary: 75 mg/dL (ref 70–99)

## 2023-04-06 LAB — T3, FREE: T3, Free: 2.8 pg/mL (ref 2.0–4.4)

## 2023-04-06 NOTE — Progress Notes (Signed)
Mobility Specialist - Progress Note   04/06/23 1448  Mobility  Activity Ambulated with assistance in room;Transferred from chair to bed  Level of Assistance Standby assist, set-up cues, supervision of patient - no hands on  Assistive Device Front wheel walker  Distance Ambulated (ft) 25 ft  Activity Response Tolerated well  $Mobility charge 1 Mobility     Pt sitting in recliner upon arrival, utilizing RA. Pt agreeable to activity. STS from chair with minA + extra time. Ambulated short distance in room with short, shuffled steps before returning supine. Pt left in bed with alarm set, needs in reach.    Filiberto Pinks Mobility Specialist 04/06/23, 2:50 PM

## 2023-04-06 NOTE — Progress Notes (Signed)
RE: [name] Date of Birth: August 05, 1950 Date: 04/06/2023     To Whom It May Concern:   Please be advised that the above-named patient will require a short-term nursing home stay - anticipated 30 days or less for rehabilitation and strengthening.  The plan is for return home

## 2023-04-06 NOTE — Plan of Care (Signed)
  Problem: Education: Goal: Knowledge of condition and prescribed therapy will improve Outcome: Progressing   Problem: Cardiac: Goal: Will achieve and/or maintain adequate cardiac output Outcome: Progressing   Problem: Physical Regulation: Goal: Complications related to the disease process, condition or treatment will be avoided or minimized Outcome: Progressing   Problem: Education: Goal: Knowledge of General Education information will improve Description: Including pain rating scale, medication(s)/side effects and non-pharmacologic comfort measures Outcome: Progressing   Problem: Health Behavior/Discharge Planning: Goal: Ability to manage health-related needs will improve Outcome: Progressing   Problem: Clinical Measurements: Goal: Ability to maintain clinical measurements within normal limits will improve Outcome: Progressing Goal: Will remain free from infection Outcome: Progressing Goal: Diagnostic test results will improve Outcome: Progressing Goal: Cardiovascular complication will be avoided Outcome: Progressing   Problem: Activity: Goal: Risk for activity intolerance will decrease Outcome: Progressing   Problem: Nutrition: Goal: Adequate nutrition will be maintained Outcome: Progressing   Problem: Coping: Goal: Level of anxiety will decrease Outcome: Progressing   Problem: Elimination: Goal: Will not experience complications related to bowel motility Outcome: Progressing Goal: Will not experience complications related to urinary retention Outcome: Progressing   Problem: Pain Managment: Goal: General experience of comfort will improve Outcome: Progressing   Problem: Safety: Goal: Ability to remain free from injury will improve Outcome: Progressing   Problem: Skin Integrity: Goal: Risk for impaired skin integrity will decrease Outcome: Progressing

## 2023-04-06 NOTE — NC FL2 (Cosign Needed Addendum)
[place] MEDICAID FL2 LEVEL OF CARE FORM     IDENTIFICATION  Patient Name: [name] Birthdate: 1951/03/01 Sex: male Admission Date (Current Location): 03/04/2023  [facility] and [place] Number:  Chiropodist and Address:  [facility], [address] [place], [place] [address]      Provider Number: [id number]  Attending Physician Name and Address:  Marcelino Duster, MD  Relative Name and Phone Number:  Trellis Paganini ( Group Home Owner) [phone]    Current Level of Care: Hospital Recommended Level of Care: Skilled Nursing Facility Prior Approval Number:    Date Approved/Denied:   PASRR Number: [id number] Discharge Plan: SNF    Current Diagnoses: Patient Active Problem List   Diagnosis Date Noted   Fall 03/17/2023   Scalp laceration 03/17/2023   Infection due to ESBL-producing Escherichia coli 03/17/2023   E coli bacteremia 03/15/2023   Hypothyroidism 03/05/2023   Syncope and collapse 03/04/2023   History of stroke 04/13/2021   Hyponatremia 04/10/2021   Wheeze    Unsteady gait    Thrombocytopenia (HCC)    COVID-19 virus infection    Elevated d-dimer    Depression    Elevated LFTs    Weakness 04/02/2021   Vitamin D deficiency 08/29/2015   Prediabetes 08/29/2015   Essential hypertension 08/28/2015   GERD (gastroesophageal reflux disease) 08/28/2015   Hyperlipidemia 08/28/2015   BPH (benign prostatic hyperplasia) 08/28/2015   Hx of sleep apnea 08/28/2015   COPD (chronic obstructive pulmonary disease) (HCC) 08/28/2015   Smoker 08/28/2015   Hx of lower gastrointestinal bleeding 08/28/2015   Hx of gastritis 08/28/2015   Bipolar 1 disorder (HCC) 08/28/2015   Schizophrenia (HCC) 08/28/2015    Orientation RESPIRATION BLADDER Height & Weight     Self, Place  Normal Incontinent Weight: 79.2 kg Height:  5\' 9"  (175.3 cm)  BEHAVIORAL SYMPTOMS/MOOD NEUROLOGICAL BOWEL NUTRITION STATUS      Incontinent  (See Discharge  Summary)  AMBULATORY STATUS COMMUNICATION OF NEEDS Skin   Extensive Assist Verbally Normal                       Personal Care Assistance Level of Assistance  Bathing, Feeding, Dressing Bathing Assistance: Maximum assistance Feeding assistance: Limited assistance Dressing Assistance: Maximum assistance     Functional Limitations Info  Sight, Hearing, Speech Sight Info: Adequate Hearing Info: Adequate Speech Info: Adequate    SPECIAL CARE FACTORS FREQUENCY  PT (By licensed PT), OT (By licensed OT)     PT Frequency: 5x weekly OT Frequency: 5x weekly            Contractures Contractures Info: Not present    Additional Factors Info  Code Status, Allergies Code Status Info: Full code Allergies Info: Asa (Aspirin)           Current Medications (04/06/2023):  This is the current hospital active medication list Current Facility-Administered Medications  Medication Dose Route Frequency Provider Last Rate Last Admin   acetaminophen (TYLENOL) tablet 650 mg  650 mg Oral Q6H PRN Gertha Calkin, MD   650 mg at 04/01/23 7253   Or   acetaminophen (TYLENOL) suppository 650 mg  650 mg Rectal Q6H PRN Gertha Calkin, MD       bisacodyl (DULCOLAX) EC tablet 10 mg  10 mg Oral QHS Gillis Santa, MD   10 mg at 04/05/23 2059   bisacodyl (DULCOLAX) suppository 10 mg  10 mg Rectal Daily PRN Gillis Santa, MD  citalopram (CELEXA) tablet 40 mg  40 mg Oral Daily Irena Cords V, MD   40 mg at 04/06/23 0845   docusate sodium (COLACE) capsule 100 mg  100 mg Oral BID Gertha Calkin, MD   100 mg at 04/06/23 0844   enoxaparin (LOVENOX) injection 40 mg  40 mg Subcutaneous QPM Gillis Santa, MD   40 mg at 04/05/23 2059   fluPHENAZine (PROLIXIN) tablet 5 mg  5 mg Oral Daily Irena Cords V, MD   5 mg at 04/06/23 1006   hydrALAZINE (APRESOLINE) injection 10 mg  10 mg Intravenous Q6H PRN Gertha Calkin, MD       lip balm (CARMEX) ointment   Topical PRN Gillis Santa, MD       methimazole (TAPAZOLE)  tablet 15 mg  15 mg Oral Daily Gillis Santa, MD   15 mg at 04/06/23 1006   ondansetron (ZOFRAN) tablet 4 mg  4 mg Oral Q6H PRN Gertha Calkin, MD       Or   ondansetron (ZOFRAN) injection 4 mg  4 mg Intravenous Q6H PRN Gertha Calkin, MD       polyethylene glycol (MIRALAX / GLYCOLAX) packet 17 g  17 g Oral BID Gillis Santa, MD   17 g at 04/06/23 0845   risperiDONE (RISPERDAL) tablet 1 mg  1 mg Oral Daily Gertha Calkin, MD   1 mg at 04/06/23 0844   simvastatin (ZOCOR) tablet 20 mg  20 mg Oral Daily Darlin Priestly, MD   20 mg at 04/06/23 0845   sodium phosphate (FLEET) enema 1 enema  1 enema Rectal Once PRN Gertha Calkin, MD         Discharge Medications: Please see discharge summary for a list of discharge medications.  Relevant Imaging Results:  Relevant Lab Results:   Additional Information SS- [id number]  Garret Reddish, RN

## 2023-04-06 NOTE — Progress Notes (Signed)
Physical Therapy Treatment Patient Details Name: [name] MRN: [id number] DOB: 05-25-1951 Today's Date: 04/06/2023   History of Present Illness Patient is a 72 year old male from group home with unwitnessed fall with possible syncopal episode, striking his head. History of hypertension, hyperlipidemia, COPD, bipolar disorder, and schizophrenia. MRI of brain with no acute intracranial pathology    PT Comments  Pt received in bed, agreed to PT session. Able to follow simple commands with increased time. Sitting EOB, pt noted to be incontinent of stool requiring several sit<>stand transfers and prolonged standing at [facility] for assistance with hygiene. Pt completed gait training in room with CGA, decreased step length with occasional unsteadiness, no true LOB. Increased tolerance for activity this date. Pt very cooperative throughout session. Current recommendations remain appropriate.    If plan is discharge home, recommend the following: A little help with walking and/or transfers;A lot of help with bathing/dressing/bathroom;Assistance with cooking/housework;Direct supervision/assist for medications management;Direct supervision/assist for financial management;Assist for transportation;Supervision due to cognitive status   Can travel by private vehicle     Yes  Equipment Recommendations  Other (comment) (TBD at next facility)    Recommendations for Other Services       Precautions / Restrictions Precautions Precautions: Fall Restrictions Weight Bearing Restrictions: No     Mobility  Bed Mobility Overal bed mobility: Needs Assistance Bed Mobility: Supine to Sit     Supine to sit: Supervision, HOB elevated          Transfers Overall transfer level: Needs assistance Equipment used: Rolling walker (2 wheels) Transfers: Sit to/from Stand Sit to Stand: Min assist, From elevated surface           General transfer comment: MinA to weight shift forward and come to upright  standing    Ambulation/Gait Ambulation/Gait assistance: Contact guard assist Gait Distance (Feet): 30 Feet Assistive device: Rolling walker (2 wheels) Gait Pattern/deviations: Decreased step length - right, Decreased step length - left, Narrow base of support, Shuffle, Step-through pattern Gait velocity: Decreased     General Gait Details: Pt declined further gait distance   Stairs             Wheelchair Mobility     Tilt Bed    Modified Rankin (Stroke Patients Only)       Balance Overall balance assessment: Needs assistance Sitting-balance support: No upper extremity supported, Feet supported Sitting balance-Leahy Scale: Good Sitting balance - Comments: Able to maintain balance while reaching out of BOS   Standing balance support: Reliant on assistive device for balance, Bilateral upper extremity supported, During functional activity Standing balance-Leahy Scale: Fair Standing balance comment: Pt able to stand on one leg at RW to don undergarments                            Cognition Arousal: Alert Behavior During Therapy: Flat affect Overall Cognitive Status: History of cognitive impairments - at baseline                                 General Comments: Answers direct questions with short responses        Exercises Total Joint Exercises Ankle Circles/Pumps: AROM, AAROM, Strengthening, Both, 5 reps, 10 reps Long Arc Quad: Strengthening, Both, 10 reps    General Comments General comments (skin integrity, edema, etc.): Pt cooperative and pleasant throughout session      Pertinent Vitals/Pain  Pain Assessment Pain Assessment: No/denies pain    Home Living                          Prior Function            PT Goals (current goals can now be found in the care plan section) Acute Rehab PT Goals Patient Stated Goal: pt did talk more than last session with Thereasa Parkin however cognition and mostly being non verbal limits  session progression. Progress towards PT goals: Progressing toward goals    Frequency    Min 1X/week      PT Plan      Co-evaluation              AM-PAC PT "6 Clicks" Mobility   Outcome Measure  Help needed turning from your back to your side while in a flat bed without using bedrails?: None Help needed moving from lying on your back to sitting on the side of a flat bed without using bedrails?: A Little Help needed moving to and from a bed to a chair (including a wheelchair)?: A Little Help needed standing up from a chair using your arms (e.g., wheelchair or bedside chair)?: A Little Help needed to walk in hospital room?: A Little Help needed climbing 3-5 steps with a railing? : A Lot 6 Click Score: 18    End of Session Equipment Utilized During Treatment: Gait belt Activity Tolerance: Patient limited by fatigue Patient left: in chair;with call bell/phone within reach;with chair alarm set Nurse Communication: Mobility status PT Visit Diagnosis: Unsteadiness on feet (R26.81);Muscle weakness (generalized) (M62.81);Difficulty in walking, not elsewhere classified (R26.2)     Time: 1027-2536 PT Time Calculation (min) (ACUTE ONLY): 32 min  Charges:    $Gait Training: 8-22 mins $Therapeutic Exercise: 8-22 mins PT General Charges $$ ACUTE PT VISIT: 1 Visit                    Zadie Cleverly, PTA  [name] 04/06/2023, 2:44 PM

## 2023-04-06 NOTE — Progress Notes (Signed)
Progress Note   Patient: [name] NUU:[id number] DOB: 1951-02-23 DOA: 03/04/2023     22 DOS: the patient was seen and examined on 04/06/2023   Brief hospital course: 72 year old male with past medical history significant for hypertension, bipolar disorder, COPD, schizophrenia and cognitive deficit, presented from group home to emergency department for evaluation of fall. Patient sustained laceration that was repaired in the ED. Patient is admitted to hospitalist service for evaluation of syncope/dizziness. Patient had imaging studies which did not reveal any acute process.  Baseline cognitive deficits, he need supervision, high risk for fall, not safe to go home. TOC working with DSS for [employer], legal guardian. 03/14/23 Blood cultures grew ESBL E.coli, got IV meropenem 1 g IV 3 times daily ID advised ertapenem 1 g IV daily for total 10 days which he finished 03/24/2023. He is currently awaiting legal guardianship with DSS and placement.  Assessment and Plan: ESBL E. coli bacteremia, unknown source Finished antibiotics. On contact precautions.   Syncope and collapse:  Unsteady gait and fall Resolved. PT follow up. Out of bed to chair. TOC working with DSS, need legal guardian as he does not have capacity to take his decisions.   Elevated TSH:  Hold methimazole. Discontinued Synthroid on 8/25.  Repeat TSH and free T4 in 4 weeks which is ordered for 04/05/23   Schizophrenia (HCC): Stable Continue Celexa, Prolixin, Risperdal. Calm, cooperative. Patient does not have capacity to make decisions for himself awaiting legal guardianship, DSS involved.   COPD:  No exacerbation. Bronchodilators as needed. Stable on room air.   Hyperlipidemia continue Zocor    Essential hypertension Continue to hold home antihypertensives du to dizzy, orthostatic. BP seems stable.    Isotonic hyponatremia most likely due to poor oral intake Na stable   Constipation,  Continue stool softeners,  laxatives PRN. Encourage out of bed to chair.   Right renal mass CT abdomen/ pelvis with 2.1 x 2.5 cm complex hypodense lesion in the inferior pole of the right kidney concerning for neoplasm. Further characterization with MRI without and with contrast recommended. Urologist  recommended MRI as an outpatient, not urgent. Follow urologist as an outpatient.   Abdominal aortic aneurysm, incidental finding on CT scan Recommend to follow with PCP for surveillance as an outpatient  PT/ OT follow up. Nursing supportive care. Fall. Aspiration precautions. Diet: Regular diet DVT Prophylaxis: Subcutaneous Lovenox       Subjective: Patient is seen and examined today morning.  He is sleeping comfortably, ate better. No overnight issues. Discussed with RN.  Physical Exam: Vitals:   04/05/23 2008 04/06/23 0500 04/06/23 0509 04/06/23 0810  BP: 118/67  117/71 125/68  Pulse: 78  71 62  Resp: 18  18 18   Temp: 99.1 F (37.3 C)  98.1 F (36.7 C) 98.2 F (36.8 C)  TempSrc: Oral  Oral Oral  SpO2: 98%  96% 97%  Weight:  79.2 kg    Height:       General - Elderly African-American male, no apparent distress HEENT - PERRLA, EOMI, atraumatic head, non tender sinuses. Lung -diffuse rhonchi Heart - S1, S2 heard, no murmurs, rubs, trace pedal edema Neuro - Alert, awake, non focal neuro exam. Skin - Warm and dry. Data Reviewed:     Latest Ref Rng & Units 03/26/2023    8:47 AM 03/24/2023    8:27 AM 03/22/2023    8:29 AM  CBC  WBC 4.0 - 10.5 K/uL 8.4  8.5  7.7   Hemoglobin 13.0 - 17.0  g/dL 19.1  47.8  29.5   Hematocrit 39.0 - 52.0 % 34.9  35.5  36.5   Platelets 150 - 400 K/uL 204  226  238        Latest Ref Rng & Units 03/26/2023    8:47 AM 03/24/2023    8:27 AM 03/22/2023    8:29 AM  BMP  Glucose 70 - 99 mg/dL 84  85  81   BUN 8 - 23 mg/dL 19  20  18    Creatinine 0.61 - 1.24 mg/dL 6.21  3.08  6.57   Sodium 135 - 145 mmol/L 137  134  134   Potassium 3.5 - 5.1 mmol/L 4.0  4.2  4.0   Chloride 98 -  111 mmol/L 105  101  103   CO2 22 - 32 mmol/L 25  25  25    Calcium 8.9 - 10.3 mg/dL 9.2  9.2  9.2    No results found.  Family Communication: TOC working with DSS for guardianship and placement.  Disposition: Status is: Inpatient Remains inpatient appropriate because: medically ready for discharge awaiting safe dc plan.  Planned Discharge Destination: Barriers to discharge: he has no capacity. DSS involved need legal guardianship    Time spent: 30 minutes  Author: Marcelino Duster, MD 04/06/2023 11:34 AM  For on call review [email or web address].

## 2023-04-06 NOTE — TOC Progression Note (Signed)
Transition of Care [facility]) - Progression Note    Patient Details  Name: [name] MRN: [id number] Date of Birth: 1950/10/09  Transition of Care [facility]) CM/SW Contact  Garret Reddish, RN Phone Number: 04/06/2023, 3:41 PM  Clinical Narrative:   Chart reviewed. I have left a voice message for [name], [name].  I have spoken with DSS worker [name].  She informs me that DSS would not be able to sign patient in to a facility without being the guardian over the patient.  I have left voice message for [name], as he was making medical decisions for the patient prior to admission.  Patient has been deemed to not have capacity to make his medical decisions. DSS was reached out to due to possible guardianship may be needed for the patient ,as he now lacks capacity to make his own medical decisions.     I have completed Fl 2, and submitted for Passr.  Patient was flagged as level 2 Passr.  H and P, Fl 2, Psyche note, 30 day note, and Progress note sent to [facility] as requested.  Will await for an assigned Passr number.    I have completed bed search to entire hub for Skilled Nursing bed.    TOC will continue to follow for discharge planning.           Expected Discharge Plan and Services                                               Social Determinants of Health (SDOH) Interventions SDOH Screenings   Food Insecurity: Patient Unable To Answer (03/05/2023)  Housing: Patient Unable To Answer (03/05/2023)  Transportation Needs: Patient Unable To Answer (03/05/2023)  Utilities: Patient Unable To Answer (03/05/2023)  Depression (PHQ2-9): Low Risk  (12/12/2020)  Tobacco Use: High Risk (03/05/2023)    Readmission Risk Interventions     No data to display

## 2023-04-07 DIAGNOSIS — R7881 Bacteremia: Secondary | ICD-10-CM | POA: Diagnosis not present

## 2023-04-07 DIAGNOSIS — I10 Essential (primary) hypertension: Secondary | ICD-10-CM | POA: Diagnosis not present

## 2023-04-07 DIAGNOSIS — J449 Chronic obstructive pulmonary disease, unspecified: Secondary | ICD-10-CM | POA: Diagnosis not present

## 2023-04-07 DIAGNOSIS — R55 Syncope and collapse: Secondary | ICD-10-CM | POA: Diagnosis not present

## 2023-04-07 LAB — GLUCOSE, CAPILLARY: Glucose-Capillary: 81 mg/dL (ref 70–99)

## 2023-04-07 NOTE — Care Management Important Message (Signed)
Important Message  Patient Details  Name: [name] MRN: [id number] Date of Birth: 02-14-1951   Medicare Important Message Given:  Other (see comment)  Patient is in an isolation room and I have tried contacting him by phone [phone]) a couple of times today but no answer. Will try again tomorrow.    Olegario Messier A Christa Fasig 04/07/2023, 2:34 PM

## 2023-04-07 NOTE — Plan of Care (Signed)
  Problem: Education: Goal: Knowledge of condition and prescribed therapy will improve Outcome: Progressing   Problem: Cardiac: Goal: Will achieve and/or maintain adequate cardiac output Outcome: Progressing   Problem: Physical Regulation: Goal: Complications related to the disease process, condition or treatment will be avoided or minimized Outcome: Progressing   Problem: Education: Goal: Knowledge of General Education information will improve Description: Including pain rating scale, medication(s)/side effects and non-pharmacologic comfort measures Outcome: Progressing   Problem: Health Behavior/Discharge Planning: Goal: Ability to manage health-related needs will improve Outcome: Progressing   Problem: Clinical Measurements: Goal: Ability to maintain clinical measurements within normal limits will improve Outcome: Progressing Goal: Will remain free from infection Outcome: Progressing Goal: Diagnostic test results will improve Outcome: Progressing Goal: Cardiovascular complication will be avoided Outcome: Progressing   Problem: Activity: Goal: Risk for activity intolerance will decrease Outcome: Progressing   Problem: Nutrition: Goal: Adequate nutrition will be maintained Outcome: Progressing   Problem: Coping: Goal: Level of anxiety will decrease Outcome: Progressing   Problem: Elimination: Goal: Will not experience complications related to bowel motility Outcome: Progressing Goal: Will not experience complications related to urinary retention Outcome: Progressing   Problem: Pain Managment: Goal: General experience of comfort will improve Outcome: Progressing   Problem: Safety: Goal: Ability to remain free from injury will improve Outcome: Progressing   Problem: Skin Integrity: Goal: Risk for impaired skin integrity will decrease Outcome: Progressing

## 2023-04-07 NOTE — Progress Notes (Signed)
Progress Note   Patient: [name] UXL:[id number] DOB: 08/15/1950 DOA: 03/04/2023     23 DOS: the patient was seen and examined on 04/07/2023   Brief hospital course: 72 year old male with past medical history significant for hypertension, bipolar disorder, COPD, schizophrenia and cognitive deficit, presented from group home to emergency department for evaluation of fall. Patient sustained laceration that was repaired in the ED. Patient is admitted to hospitalist service for evaluation of syncope/dizziness. Patient had imaging studies which did not reveal any acute process.  Baseline cognitive deficits, he need supervision, high risk for fall, not safe to go home. TOC working with DSS for [employer], legal guardian. 03/14/23 Blood cultures grew ESBL E.coli, got IV meropenem 1 g IV 3 times daily ID advised ertapenem 1 g IV daily for total 10 days which he finished 03/24/2023. He is currently awaiting legal guardianship with DSS and placement.  Assessment and Plan: ESBL E. coli bacteremia, unknown source Finished antibiotics. On contact precautions.   Syncope and collapse:  Unsteady gait and fall Resolved. PT follow up. Out of bed to chair. TOC working with DSS, need legal guardian as he does not have capacity to take his decisions.   Elevated TSH:  Hold methimazole. Discontinued Synthroid on 8/25.  Repeat TSH and free T4 in 4 weeks which is ordered for 04/05/23   Schizophrenia (HCC): Stable Continue Celexa, Prolixin, Risperdal. Calm, cooperative. Patient does not have capacity to make decisions for himself awaiting legal guardianship, DSS involved.   COPD:  No exacerbation. Bronchodilators as needed. Stable on room air.   Hyperlipidemia continue Zocor    Essential hypertension Continue to hold home antihypertensives du to dizzy, orthostatic. BP seems stable.    Isotonic hyponatremia most likely due to poor oral intake Na stable   Constipation,  Continue stool softeners,  laxatives PRN. Encourage out of bed to chair.   Right renal mass CT abdomen/ pelvis with 2.1 x 2.5 cm complex hypodense lesion in the inferior pole of the right kidney concerning for neoplasm. Further characterization with MRI without and with contrast recommended. Urologist  recommended MRI as an outpatient, not urgent. Follow urologist as an outpatient.   Abdominal aortic aneurysm, incidental finding on CT scan Recommend to follow with PCP for surveillance as an outpatient  PT/ OT follow up. Nursing supportive care. Fall. Aspiration precautions. Diet: Regular diet DVT Prophylaxis: Subcutaneous Lovenox       Subjective: Patient is seen and examined today morning.  He is sleeping comfortably, ate better. No overnight issues. Discussed with RN.  Physical Exam: Vitals:   04/06/23 0810 04/06/23 2057 04/07/23 0402 04/07/23 0500  BP: 125/68 123/65 105/61   Pulse: 62 84 80   Resp: 18     Temp: 98.2 F (36.8 C) 98.8 F (37.1 C) 98.9 F (37.2 C)   TempSrc: Oral Oral Oral   SpO2: 97% 98% 96%   Weight:    78.7 kg  Height:       General - Elderly African-American male, no apparent distress HEENT - PERRLA, EOMI, atraumatic head, non tender sinuses. Lung -diffuse rhonchi Heart - S1, S2 heard, no murmurs, rubs, trace pedal edema Neuro - Alert, awake, non focal neuro exam. Skin - Warm and dry. Data Reviewed:     Latest Ref Rng & Units 03/26/2023    8:47 AM 03/24/2023    8:27 AM 03/22/2023    8:29 AM  CBC  WBC 4.0 - 10.5 K/uL 8.4  8.5  7.7   Hemoglobin 13.0 - 17.0  g/dL 53.6  64.4  03.4   Hematocrit 39.0 - 52.0 % 34.9  35.5  36.5   Platelets 150 - 400 K/uL 204  226  238        Latest Ref Rng & Units 03/26/2023    8:47 AM 03/24/2023    8:27 AM 03/22/2023    8:29 AM  BMP  Glucose 70 - 99 mg/dL 84  85  81   BUN 8 - 23 mg/dL 19  20  18    Creatinine 0.61 - 1.24 mg/dL 7.42  5.95  6.38   Sodium 135 - 145 mmol/L 137  134  134   Potassium 3.5 - 5.1 mmol/L 4.0  4.2  4.0   Chloride 98 - 111  mmol/L 105  101  103   CO2 22 - 32 mmol/L 25  25  25    Calcium 8.9 - 10.3 mg/dL 9.2  9.2  9.2    No results found.  Family Communication: TOC working with DSS for guardianship and placement.  Disposition: Status is: Inpatient Remains inpatient appropriate because: medically ready for discharge awaiting safe dc plan.  Planned Discharge Destination: Barriers to discharge: he has no capacity. DSS involved need legal guardianship    Time spent: 28 minutes  Author: Marcelino Duster, MD 04/07/2023 3:45 PM  For on call review [email or web address].

## 2023-04-07 NOTE — TOC Progression Note (Signed)
Transition of Care [facility]) - Progression Note    Patient Details  Name: [name] MRN: [id number] Date of Birth: [birth year]  [facility]) CM/SW Contact  Garret Reddish, RN Phone Number: 04/07/2023, 10:42 AM  Clinical Narrative:     Chart reviewed.  [place] Passr stilling pending at this time.    I have received a call from Mr. Utah Welden owner of [facility].  Mr. Tough informs me that he is a distant resident of Mr. Thi and would be willing to consider taking guardianship.  I have informed Mr. Pearl that currently DSS is working on Mrs. Hritz case for possible guardianship.  I informed Mr. Mortazavi that I would make DSS aware that he would be willing to be a guardian for Mr. Poston.   Mr. Kuehler reports that once patient is able to discharge from short term rehab he has a room available for him at his two locations.  He has a location in [facility] and in a newly opened facility in [place].    I have received bed offer from [facility] with [facility].   I have informed Mrs. Barry Brunner, DSS Case Worker and informed her of above bed offer.  I have  made Mrs. Barry Brunner aware that Mr. Lorne Skeens Scamardo is interested in becoming Mr. Callison  legal guardian.  Mr. Barry Brunner will speak with her Supervisor in regard to Mr. Klebe wanting to become legal guardian for Mr. Bieger.     I have left a voicemail for Lesly Dukes, Admission Coordinator for [facility].    TOC will continue to follow for discharge planning.           Expected Discharge Plan and Services                                               Social Determinants of Health (SDOH) Interventions SDOH Screenings   Food Insecurity: Patient Unable To Answer (03/05/2023)  Housing: Patient Unable To Answer (03/05/2023)  Transportation Needs: Patient Unable To Answer (03/05/2023)  Utilities: Patient Unable To Answer (03/05/2023)  Depression (PHQ2-9): Low Risk  (12/12/2020)   Tobacco Use: High Risk (03/05/2023)    Readmission Risk Interventions     No data to display

## 2023-04-07 NOTE — Progress Notes (Signed)
Occupational Therapy Treatment Patient Details Name: [name] MRN: [id number] DOB: April 15, 1951 Today's Date: 04/07/2023   History of present illness Patient is a 72 year old male from group home with unwitnessed fall with possible syncopal episode, striking his head. History of hypertension, hyperlipidemia, COPD, bipolar disorder, and schizophrenia. MRI of brain with no acute intracranial pathology   OT comments  Pt. Was assisted with personal care and hygiene with nursing following a large BM. Soiled bed linens were replaced with fresh clean linens. Pt. Was provided with, and assisted with a fresh clean gown and nonskid footie socks. Pt. Required MaxA for LE bathing, ModA donning socks, minA donning gown. Pt. Required set-up for hand hygiene.  Pt. Continues to benefit from OT services for ADL training, and pt./caregiver education.       If plan is discharge home, recommend the following:      Equipment Recommendations       Recommendations for Other Services      Precautions / Restrictions Precautions Precautions: Fall Restrictions Weight Bearing Restrictions: No       Mobility Bed Mobility     Rolling: Used rails         General bed mobility comments: MinA  with cues, and increased time to complete    Transfers                         Balance                                           ADL either performed or assessed with clinical judgement   ADL       Grooming: Wash/dry hands;Bed level with set-up       Lower Body Bathing: Maximal assistance   Upper Body Dressing : Minimal assistance     Lower Body Dressing Details (indicate cue type and reason): ModA                    Extremity/Trunk Assessment Upper Extremity Assessment Upper Extremity Assessment: Overall WFL for tasks assessed   Lower Extremity Assessment Lower Extremity Assessment: Overall WFL for tasks assessed        Vision Patient Visual Report:  No change from baseline     Perception     Praxis      Cognition Arousal: Alert   Overall Cognitive Status: History of cognitive impairments - at baseline                                 General Comments: Answers direct questions with short responses        Exercises      Shoulder Instructions       General Comments      Pertinent Vitals/ Pain       Pain Assessment Pain Assessment: No/denies pain  Home Living                                          Prior Functioning/Environment              Frequency  Min 1X/week        Progress Toward Goals  OT Goals(current goals can now be found in  the care plan section)  Progress towards OT goals: Progressing toward goals  Acute Rehab OT Goals Patient Stated Goal: To feel better Time For Goal Achievement: 04/19/23 Potential to Achieve Goals: Good  Plan      Co-evaluation                 AM-PAC OT "6 Clicks" Daily Activity     Outcome Measure   Help from another person eating meals?: None Help from another person taking care of personal grooming?: A Little Help from another person toileting, which includes using toliet, bedpan, or urinal?: A Little Help from another person bathing (including washing, rinsing, drying)?: A Lot Help from another person to put on and taking off regular upper body clothing?: A Little Help from another person to put on and taking off regular lower body clothing?: A Lot 6 Click Score: 17    End of Session    OT Visit Diagnosis: Unsteadiness on feet (R26.81);Repeated falls (R29.6);Muscle weakness (generalized) (M62.81)   Activity Tolerance Patient tolerated treatment well   Patient Left in bed;with call bell/phone within reach;with bed alarm set   Nurse Communication          Time: 1425-1500 OT Time Calculation (min): 35 min  Charges: OT General Charges $OT Visit: 1 Visit OT Treatments $Self Care/Home Management : 23-37  mins  Olegario Messier, MS, OTR/L   Olegario Messier 04/07/2023, 4:34 PM

## 2023-04-08 DIAGNOSIS — I10 Essential (primary) hypertension: Secondary | ICD-10-CM | POA: Diagnosis not present

## 2023-04-08 DIAGNOSIS — R7881 Bacteremia: Secondary | ICD-10-CM | POA: Diagnosis not present

## 2023-04-08 DIAGNOSIS — J449 Chronic obstructive pulmonary disease, unspecified: Secondary | ICD-10-CM | POA: Diagnosis not present

## 2023-04-08 DIAGNOSIS — R55 Syncope and collapse: Secondary | ICD-10-CM | POA: Diagnosis not present

## 2023-04-08 LAB — GLUCOSE, CAPILLARY: Glucose-Capillary: 90 mg/dL (ref 70–99)

## 2023-04-08 LAB — T4, FREE: Free T4: 1.54 ng/dL — ABNORMAL HIGH (ref 0.61–1.12)

## 2023-04-08 NOTE — Progress Notes (Signed)
Physical Therapy Treatment Patient Details Name: [name] MRN: [id number] DOB: [birth year] Today's Date: 04/08/2023   History of Present Illness Patient is a 72 year old male from group home with unwitnessed fall with possible syncopal episode, striking his head. History of hypertension, hyperlipidemia, COPD, bipolar disorder, and schizophrenia. MRI of brain with no acute intracranial pathology    PT Comments  Patient is agreeable to PT session. Patient continues to require assistance for bed mobility and transfers. Patient was able to stand x 2 bouts with Min A and cues for technique. Short distance ambulation performed with Min A using rolling walker. Standing activity tolerance is limited for progression of further ambulation. Anticipate patient will require continued rehabilitation after this hospital stay < 3 hours/day. Care plan extended by one week and goals remain appropriate this time. PT will continue to follow.     If plan is discharge home, recommend the following: A little help with walking and/or transfers;A lot of help with bathing/dressing/bathroom;Assistance with cooking/housework;Direct supervision/assist for medications management;Direct supervision/assist for financial management;Assist for transportation;Supervision due to cognitive status   Can travel by private vehicle     Yes  Equipment Recommendations   (to be determined at next level of care)    Recommendations for Other Services       Precautions / Restrictions Precautions Precautions: Fall Restrictions Weight Bearing Restrictions: No     Mobility  Bed Mobility Overal bed mobility: Needs Assistance Bed Mobility: Supine to Sit, Sit to Supine     Supine to sit: Contact guard Sit to supine: Contact guard assist   General bed mobility comments: increased time and effort required    Transfers Overall transfer level: Needs assistance Equipment used: Rolling walker (2 wheels) Transfers: Sit to/from  Stand Sit to Stand: Min assist           General transfer comment: lifting assistance required for standing. 2 standing bouts performed    Ambulation/Gait Ambulation/Gait assistance: Min assist Gait Distance (Feet): 3 Feet Assistive device: Rolling walker (2 wheels) Gait Pattern/deviations: Decreased stride length Gait velocity: decreased     General Gait Details: patient is able to ambulate a short distance in the room x 2 bouts with seated rest break required between bouts of walking. standing tolerance is limited for progression of further ambulation   Stairs             Wheelchair Mobility     Tilt Bed    Modified Rankin (Stroke Patients Only)       Balance Overall balance assessment: Needs assistance Sitting-balance support: Feet supported Sitting balance-Leahy Scale: Good     Standing balance support: Reliant on assistive device for balance, Bilateral upper extremity supported, During functional activity Standing balance-Leahy Scale: Poor Standing balance comment: heavy reliance on rolling walker for support in standing                            Cognition Arousal: Alert Behavior During Therapy: Flat affect Overall Cognitive Status: History of cognitive impairments - at baseline                                 General Comments: patient is able to follow single step commands consistently with delay        Exercises      General Comments        Pertinent Vitals/Pain Pain Assessment Pain Assessment: No/denies  pain    Home Living                          Prior Function            PT Goals (current goals can now be found in the care plan section) Acute Rehab PT Goals Patient Stated Goal: none stated PT Goal Formulation: With patient Time For Goal Achievement: 04/15/23 Potential to Achieve Goals: Fair Progress towards PT goals: Progressing toward goals    Frequency    Min 1X/week      PT  Plan      Co-evaluation              AM-PAC PT "6 Clicks" Mobility   Outcome Measure  Help needed turning from your back to your side while in a flat bed without using bedrails?: None Help needed moving from lying on your back to sitting on the side of a flat bed without using bedrails?: A Little Help needed moving to and from a bed to a chair (including a wheelchair)?: A Little Help needed standing up from a chair using your arms (e.g., wheelchair or bedside chair)?: A Little Help needed to walk in hospital room?: A Little Help needed climbing 3-5 steps with a railing? : A Lot 6 Click Score: 18    End of Session Equipment Utilized During Treatment: Gait belt Activity Tolerance: Patient tolerated treatment well Patient left: in bed;with call bell/phone within reach;with bed alarm set Nurse Communication: Mobility status (discussed with nurse tech) PT Visit Diagnosis: Unsteadiness on feet (R26.81);Muscle weakness (generalized) (M62.81);Difficulty in walking, not elsewhere classified (R26.2)     Time: 2595-6387 PT Time Calculation (min) (ACUTE ONLY): 11 min  Charges:    $Therapeutic Activity: 8-22 mins PT General Charges $$ ACUTE PT VISIT: 1 Visit                    Donna Bernard, PT, MPT    Ina Homes 04/08/2023, 2:53 PM

## 2023-04-08 NOTE — Plan of Care (Signed)
  Problem: Education: Goal: Knowledge of condition and prescribed therapy will improve Outcome: Progressing   Problem: Cardiac: Goal: Will achieve and/or maintain adequate cardiac output Outcome: Progressing   Problem: Physical Regulation: Goal: Complications related to the disease process, condition or treatment will be avoided or minimized Outcome: Progressing   Problem: Education: Goal: Knowledge of General Education information will improve Description: Including pain rating scale, medication(s)/side effects and non-pharmacologic comfort measures Outcome: Progressing   Problem: Health Behavior/Discharge Planning: Goal: Ability to manage health-related needs will improve Outcome: Progressing   Problem: Clinical Measurements: Goal: Ability to maintain clinical measurements within normal limits will improve Outcome: Progressing Goal: Will remain free from infection Outcome: Progressing Goal: Diagnostic test results will improve Outcome: Progressing Goal: Cardiovascular complication will be avoided Outcome: Progressing   Problem: Activity: Goal: Risk for activity intolerance will decrease Outcome: Progressing   Problem: Nutrition: Goal: Adequate nutrition will be maintained Outcome: Progressing   Problem: Coping: Goal: Level of anxiety will decrease Outcome: Progressing   Problem: Elimination: Goal: Will not experience complications related to bowel motility Outcome: Progressing Goal: Will not experience complications related to urinary retention Outcome: Progressing   Problem: Pain Managment: Goal: General experience of comfort will improve Outcome: Progressing   Problem: Safety: Goal: Ability to remain free from injury will improve Outcome: Progressing   Problem: Skin Integrity: Goal: Risk for impaired skin integrity will decrease Outcome: Progressing

## 2023-04-08 NOTE — Care Management Important Message (Signed)
Important Message  Patient Details  Name: [name] MRN: [id number] Date of Birth: May 19, 1951   Medicare Important Message Given:  Yes  I received a call and reviewed the Important Message from Medicare with Aceson Kuhlmann the patients distant nephew on his father's side of the family. He is the owner of [facility] where that patient resides and has relayed to [facility] that he would be interested in guardianship if needed. Stated he understood these rights and would like to be contacted at discharge so he can make plans to visit him at the skilled facility.I will place a note in the handoff column for RNCM.    Olegario Messier A Noemi Ishmael 04/08/2023, 9:39 AM

## 2023-04-08 NOTE — Progress Notes (Signed)
Progress Note   Patient: [name] MWN:[id number] DOB: Jan 14, 1951 DOA: 03/04/2023     24 DOS: the patient was seen and examined on 04/08/2023   Brief hospital course: 72 year old male with past medical history significant for hypertension, bipolar disorder, COPD, schizophrenia and cognitive deficit, presented from group home to emergency department for evaluation of fall. Patient sustained laceration that was repaired in the ED. Patient is admitted to hospitalist service for evaluation of syncope/dizziness. Patient had imaging studies which did not reveal any acute process.  Baseline cognitive deficits, he need supervision, high risk for fall, not safe to go home. TOC working with DSS for [employer], legal guardian. 03/14/23 Blood cultures grew ESBL E.coli, got IV meropenem 1 g IV 3 times daily ID advised ertapenem 1 g IV daily for total 10 days which he finished 03/24/2023. He is currently awaiting legal guardianship with DSS and insurance auth for placement.  Assessment and Plan: ESBL E. coli bacteremia, unknown source Finished antibiotics. On contact precautions.   Syncope and collapse:  Unsteady gait and fall Resolved. PT follow up. Out of bed to chair. TOC working with DSS, need legal guardian as he does not have capacity to take his decisions.   Elevated TSH:  Hold methimazole. Discontinued Synthroid on 8/25.  Repeat TSH and free T4 in 4 weeks which is ordered for 04/05/23   Schizophrenia (HCC): Stable Continue Celexa, Prolixin, Risperdal. Calm, cooperative. Patient does not have capacity to make decisions for himself awaiting legal guardianship, DSS involved.   COPD:  No exacerbation. Bronchodilators as needed. Stable on room air.   Hyperlipidemia continue Zocor    Essential hypertension Continue to hold home antihypertensives du to dizzy, orthostatic. BP seems stable.    Isotonic hyponatremia most likely due to poor oral intake Na stable   Constipation,  Continue stool  softeners, laxatives PRN. Encourage out of bed to chair.   Right renal mass CT abdomen/ pelvis with 2.1 x 2.5 cm complex hypodense lesion in the inferior pole of the right kidney concerning for neoplasm. Further characterization with MRI without and with contrast recommended. Urologist  recommended MRI as an outpatient, not urgent. Follow urologist as an outpatient.   Abdominal aortic aneurysm, incidental finding on CT scan Recommend to follow with PCP for surveillance as an outpatient  PT/ OT follow up. Nursing supportive care. Fall. Aspiration precautions. Diet: Regular diet DVT Prophylaxis: Subcutaneous Lovenox       Subjective: Patient is seen and examined today morning.  He is sleeping comfortably, ate better. No overnight issues. Discussed with RN.  Physical Exam: Vitals:   04/07/23 2046 04/08/23 0524 04/08/23 0837 04/08/23 1629  BP: 128/64 127/64 112/65 (!) 118/54  Pulse: 75 69 65 62  Resp:   17 18  Temp: 98.8 F (37.1 C) 98.7 F (37.1 C) 98.4 F (36.9 C) (!) 97.4 F (36.3 C)  TempSrc: Oral Oral Oral   SpO2: 96% 99% 98% 97%  Weight:      Height:       General - Elderly African-American male, no apparent distress HEENT - PERRLA, EOMI, atraumatic head, non tender sinuses. Lung -diffuse rhonchi Heart - S1, S2 heard, no murmurs, rubs, trace pedal edema Neuro - Alert, awake, non focal neuro exam. Skin - Warm and dry. Data Reviewed:     Latest Ref Rng & Units 03/26/2023    8:47 AM 03/24/2023    8:27 AM 03/22/2023    8:29 AM  CBC  WBC 4.0 - 10.5 K/uL 8.4  8.5  7.7   Hemoglobin 13.0 - 17.0 g/dL 69.6  29.5  28.4   Hematocrit 39.0 - 52.0 % 34.9  35.5  36.5   Platelets 150 - 400 K/uL 204  226  238        Latest Ref Rng & Units 03/26/2023    8:47 AM 03/24/2023    8:27 AM 03/22/2023    8:29 AM  BMP  Glucose 70 - 99 mg/dL 84  85  81   BUN 8 - 23 mg/dL 19  20  18    Creatinine 0.61 - 1.24 mg/dL 1.32  4.40  1.02   Sodium 135 - 145 mmol/L 137  134  134   Potassium 3.5 - 5.1  mmol/L 4.0  4.2  4.0   Chloride 98 - 111 mmol/L 105  101  103   CO2 22 - 32 mmol/L 25  25  25    Calcium 8.9 - 10.3 mg/dL 9.2  9.2  9.2    No results found.  Family Communication: TOC working with DSS for guardianship and placement.  Disposition: Status is: Inpatient Remains inpatient appropriate because: medically ready for discharge awaiting safe dc plan.  Planned Discharge Destination: Barriers to discharge: he has no capacity. DSS involved need legal guardianship , insurance auth pending for facility placement.    Time spent: 28 minutes  Author: Marcelino Duster, MD 04/08/2023 5:30 PM  For on call review [email or web address].

## 2023-04-08 NOTE — TOC Progression Note (Signed)
Transition of Care [facility]) - Progression Note    Patient Details  Name: [name] MRN: [id number] Date of Birth: July 31, 1950  [facility] CM/SW Contact  Garret Reddish, RN Phone Number: 04/08/2023, 11:38 AM  Clinical Narrative:   Obtained Passur number.  Passr number is [id number] [phone].  Passr will expire in 30 days which will be 05-07-2023.    I have spoken with Allision, Admission Coordinator with [facility].  She is able to accept patient.  She informs me that [name] will be able to sign patient into the facility as he is wanting to obtain guardianship for [name].  I have left a message for [name] to see if he would be willing to sign [name] into the facility.  The Hospital will be willing to sign the patient into the facility also.   TOC will start insurance authorization once finalize who will sign the patient into the facility.    TOC will continue to follow patient for discharge planning.             Expected Discharge Plan and Services                                               Social Determinants of Health (SDOH) Interventions SDOH Screenings   Food Insecurity: Patient Unable To Answer (03/05/2023)  Housing: Patient Unable To Answer (03/05/2023)  Transportation Needs: Patient Unable To Answer (03/05/2023)  Utilities: Patient Unable To Answer (03/05/2023)  Depression (PHQ2-9): Low Risk  (12/12/2020)  Tobacco Use: High Risk (03/05/2023)    Readmission Risk Interventions     No data to display

## 2023-04-09 DIAGNOSIS — J449 Chronic obstructive pulmonary disease, unspecified: Secondary | ICD-10-CM | POA: Diagnosis not present

## 2023-04-09 DIAGNOSIS — R7881 Bacteremia: Secondary | ICD-10-CM | POA: Diagnosis not present

## 2023-04-09 DIAGNOSIS — I10 Essential (primary) hypertension: Secondary | ICD-10-CM | POA: Diagnosis not present

## 2023-04-09 DIAGNOSIS — R55 Syncope and collapse: Secondary | ICD-10-CM | POA: Diagnosis not present

## 2023-04-09 LAB — GLUCOSE, CAPILLARY: Glucose-Capillary: 90 mg/dL (ref 70–99)

## 2023-04-09 LAB — T4, FREE: Free T4: 1.58 ng/dL — ABNORMAL HIGH (ref 0.61–1.12)

## 2023-04-09 NOTE — Progress Notes (Signed)
Occupational Therapy Treatment Patient Details Name: [name] MRN: [id number] DOB: 1950-11-02 Today's Date: 04/09/2023   History of present illness Patient is a 72 year old male from group home with unwitnessed fall with possible syncopal episode, striking his head. History of hypertension, hyperlipidemia, COPD, bipolar disorder, and schizophrenia. MRI of brain with no acute intracranial pathology   OT comments  Pt received supine, agreeable to OT tx. Flat affect throughout, increased time needed for responses. Performs ADLs as indicated below in flowsheet. Sitting EOB to wash hands/face, then working on functional transfer abilities with sit / stands x 4, minA-progressing to CGA with increased reps + cuing to control descent. Lateral scooting with cues for sequencing toward HOB. Benefits from increased reps of sit to stands to improve technique and safety. Pt making good progress towards goals, OT will continue to follow for functional gains.       If plan is discharge home, recommend the following:  A little help with walking and/or transfers;A little help with bathing/dressing/bathroom;Help with stairs or ramp for entrance;Supervision due to cognitive status;Direct supervision/assist for medications management   Equipment Recommendations  Other (comment) (RW)    Recommendations for Other Services Other (comment)    Precautions / Restrictions Precautions Precautions: Fall Restrictions Weight Bearing Restrictions: No       Mobility Bed Mobility Overal bed mobility: Needs Assistance Bed Mobility: Supine to Sit, Sit to Supine     Supine to sit: Supervision Sit to supine: Supervision        Transfers Overall transfer level: Needs assistance Equipment used: Rolling walker (2 wheels) Transfers: Sit to/from Stand Sit to Stand: Min assist, Contact guard assist           General transfer comment: cues for hand placement, improving to CGA after proper technique and cues  to control descent     Balance Overall balance assessment: Needs assistance Sitting-balance support: Feet supported Sitting balance-Leahy Scale: Good     Standing balance support: Reliant on assistive device for balance, Bilateral upper extremity supported, During functional activity   Standing balance comment: RW for support                           ADL either performed or assessed with clinical judgement   ADL Overall ADL's : Needs assistance/impaired Eating/Feeding: Set up;Sitting   Grooming: Wash/dry hands;Wash/dry face;Set up;Brushing hair                               Functional mobility during ADLs: Rolling walker (2 wheels) General ADL Comments: Sitting EOB to wash hands/face, then working on functional transfer abilities with sit / stands x 4, minA-progressing to CGA with increased reps + cuing to control descent. Lateral scooting with cues for sequencing toward HOB      Cognition Arousal: Alert Behavior During Therapy: Flat affect Overall Cognitive Status: History of cognitive impairments - at baseline                                 General Comments: patient is able to follow single step commands consistently with delay                   Pertinent Vitals/ Pain       Pain Assessment Pain Assessment: No/denies pain   Frequency  Min 1X/week  Progress Toward Goals  OT Goals(current goals can now be found in the care plan section)  Progress towards OT goals: Progressing toward goals  Acute Rehab OT Goals Time For Goal Achievement: 04/19/23 Potential to Achieve Goals: Good  Plan         AM-PAC OT "6 Clicks" Daily Activity     Outcome Measure   Help from another person eating meals?: None Help from another person taking care of personal grooming?: A Little Help from another person toileting, which includes using toliet, bedpan, or urinal?: A Little Help from another person bathing (including washing,  rinsing, drying)?: A Lot Help from another person to put on and taking off regular upper body clothing?: A Little Help from another person to put on and taking off regular lower body clothing?: A Lot 6 Click Score: 17    End of Session Equipment Utilized During Treatment: Rolling walker (2 wheels);Gait belt  OT Visit Diagnosis: Unsteadiness on feet (R26.81);Repeated falls (R29.6);Muscle weakness (generalized) (M62.81)   Activity Tolerance Patient tolerated treatment well   Patient Left in bed;with call bell/phone within reach;with bed alarm set   Nurse Communication Other (comment)        Time: 1610-9604 OT Time Calculation (min): 12 min  Charges: OT General Charges $OT Visit: 1 Visit OT Treatments $Self Care/Home Management : 8-22 mins  Daianna Vasques L. Alithia Zavaleta, OTR/L  04/09/23, 3:34 PM

## 2023-04-09 NOTE — Discharge Summary (Signed)
Physician Discharge Summary   Patient: [name] MRN: [id number] DOB: 1951/02/04  Admit date:     03/04/2023  Discharge date: 04/09/23  Discharge Physician: Marcelino Duster   PCP: Koren Bound, NP   Recommendations at discharge:    PCP follow up in 1 week. Follow TSH, free t3, t4 levels. He has h/o hyperthyroid on methimazole. Outpatient Urology evaluation for renal mass. Outpatient surveillance 3 year CT abdomen for Abdominal aortic aneurysm  Discharge Diagnoses: Principal Problem:   Syncope and collapse Active Problems:   Essential hypertension   GERD (gastroesophageal reflux disease)   Hyperlipidemia   COPD (chronic obstructive pulmonary disease) (HCC)   Schizophrenia (HCC)   Unsteady gait   Hypothyroidism   E coli bacteremia   Fall   Scalp laceration   Infection due to ESBL-producing Escherichia coli  Resolved Problems:   * No resolved hospital problems. *  Hospital Course: 72 year old male with past medical history significant for hypertension, bipolar disorder, COPD, schizophrenia and cognitive deficit, presented from group home to emergency department for evaluation of fall. Patient sustained laceration that was repaired in the ED. Patient is admitted to hospitalist service for evaluation of syncope/dizziness. Patient had imaging studies which did not reveal any acute process.  Baseline cognitive deficits, he need supervision, high risk for fall, not safe to go home. TOC working with DSS for [employer], legal guardian. His blood cultures grew ESBL E.coli, got IV meropenem 1 g IV 3 times daily ID advised ertapenem 1 g IV daily for total 10 days until 03/24/2023. He finished antibiotic regimen awaiting legal guardianship with DSS.  Assessment and Plan: ESBL E. coli bacteremia, unknown source Finished antibiotics. On contact precautions.   Syncope and collapse:  Unsteady gait and fall Resolved. PT follow up. Out of bed to chair. TOC working with DSS, need legal  guardian as he does not have capacity to take his decisions.   Hyperthyroidism Upon presentation, TSH was high, methimazole held and started Synthroid. Later synthroid held. 9/16 TSH 1.3 wnl, free T4 level 1.7 elevated 9/16 started methimazole 15 mg p.o. daily Outpatient follow up of TSH ,T3, T4 levels.   Schizophrenia (HCC): Stable Continue Celexa, Prolixin, Risperdal  Patient does not have capacity to make decisions for himself. He will need legal guardianship, DSS involved.   COPD:  No exacerbation. Bronchodilators as needed. He is on room air.   Hyperlipidemia cont Zocor    Essential hypertension Held home antihypertensives as he was dizzy, orthostatic. BP stable without antihypertensive medications   Isotonic hyponatremia, resolved  most likely due to poor oral intake S/p Na tablets.   Constipation,  Continue stool softeners, laxatives PRN.   Right renal mass CT abdomen/ pelvis with 2.1 x 2.5 cm complex hypodense lesion in the inferior pole of the right kidney concerning for neoplasm. Further characterization with MRI without and with contrast recommended. Urologist  recommended MRI as an outpatient, not urgent. Follow urologist as an outpatient.   Abdominal aortic aneurysm, incidental finding on CT scan Recommend to follow with PCP for surveillance as an outpatient       Consultants: ID, psychiatry Procedures performed: none  Disposition: Skilled nursing facility Diet recommendation:  Discharge Diet Orders (From admission, onward)     Start     Ordered   04/09/23 0000  Diet - low sodium heart healthy        04/09/23 1303           Cardiac diet DISCHARGE MEDICATION: Allergies as of 04/09/2023  Reactions   Asa [aspirin]    Hx GI bleed        Medication List     STOP taking these medications    amLODipine 5 MG tablet Commonly known as: NORVASC   lisinopril 10 MG tablet Commonly known as: ZESTRIL   traZODone 50 MG tablet Commonly  known as: DESYREL       TAKE these medications    citalopram 40 MG tablet Commonly known as: CELEXA Take 1 tablet (40 mg total) by mouth daily.   fluPHENAZine 5 MG tablet Commonly known as: PROLIXIN Take 1 tablet (5 mg total) by mouth daily.   methimazole 5 MG tablet Commonly known as: TAPAZOLE Take 5 mg by mouth 3 (three) times daily.   risperiDONE 1 MG tablet Commonly known as: RISPERDAL Take 1 tablet (1 mg total) by mouth daily.   simvastatin 20 MG tablet Commonly known as: ZOCOR TAKE 1 TABLET BY MOUTH DAILY   tamsulosin 0.4 MG Caps capsule Commonly known as: FLOMAX TAKE 1 CAPSULE BY MOUTH DAILY AFTER SUPPER   Vitamin D3 25 MCG (1000 UT) Caps Take 1 capsule (1,000 Units total) by mouth daily.        Follow-up Information     Koren Bound, NP. Go in 1 week.   Specialty: Nurse Practitioner Why: 1-2 weeks for hospital follow up Contact information: [address] [place] [facility] [address] [phone]         [facility] Schedule an appointment as soon as possible for a visit in 1 week(s).   Why: Please made appointment for 1-2 weeks post discharge for re-establishment of mental health care. Contact information: [address] [place] [address] [phone]         Ray Church III, MD Follow up in 1 month(s).   Specialty: Urology Contact information: [address] [place] [address] [phone]                Discharge Exam: Ceasar Mons Weights   04/06/23 0500 04/07/23 0500 04/09/23 0432  Weight: 79.2 kg 78.7 kg 78.9 kg   General - Elderly African-American male, calm, no apparent distress HEENT - PERRLA, EOMI, atraumatic head, non tender sinuses. Lung -diffuse rhonchi Heart - S1, S2 heard, no murmurs, rubs, trace pedal edema Neuro - Alert, awake, non focal neuro exam. Skin - Warm and dry.  Condition at discharge: stable  The results of significant diagnostics from this hospitalization (including imaging,  microbiology, ancillary and laboratory) are listed below for reference.   Imaging Studies: CT ABDOMEN PELVIS W CONTRAST  Result Date: 03/19/2023 CLINICAL DATA:  Sepsis. EXAM: CT ABDOMEN AND PELVIS WITH CONTRAST TECHNIQUE: Multidetector CT imaging of the abdomen and pelvis was performed using the standard protocol following bolus administration of intravenous contrast. RADIATION DOSE REDUCTION: This exam was performed according to the departmental dose-optimization program which includes automated exposure control, adjustment of the mA and/or kV according to patient size and/or use of iterative reconstruction technique. CONTRAST:  OMNIPAQUE IOHEXOL 300 MG/ML  SOLN COMPARISON:  None Available. FINDINGS: Lower chest: Patchy left lung base consolidation may represent atelectasis or scarring. Pneumonia is not excluded clinical correlation is recommended. There is coronary vascular calcification. No intra-abdominal free air or free fluid. Hepatobiliary: The liver is unremarkable. No biliary ductal dilatation. The gallbladder is unremarkable. Pancreas: Unremarkable. No pancreatic ductal dilatation or surrounding inflammatory changes. Spleen: Normal in size without focal abnormality. Adrenals/Urinary Tract: The adrenal glands unremarkable. There is a 2.1 x 2.5 cm  complex hypodense lesion in the inferior pole of the right kidney with apparent areas of enhancement as well as small areas of calcification concerning for neoplasm. Further characterization with MRI without and with contrast recommended. A 2 cm cyst in the inferior pole of the right kidney more superiorly noted. There is a focal area of parenchymal calcification in the lateral interpolar right kidney. A 2 mm nonobstructing left renal inferior pole calculus versus parenchymal calcification. There is no hydronephrosis on either side. Slight heterogeneous enhancement of the right renal parenchyma. Correlation with urinalysis recommended to exclude  pyelonephritis. The visualized ureters and urinary bladder probable. Stomach/Bowel: There is moderate stool throughout the colon. There is no bowel obstruction or active inflammation. The appendix is normal. Vascular/Lymphatic: Advanced aortoiliac atherosclerotic disease. There is a 3 cm infrarenal abdominal aortic aneurysm. The IVC is unremarkable. No portal venous gas. There is no adenopathy. Reproductive: The prostate and seminal vesicles are grossly unremarkable. No pelvic mass. Other: None Musculoskeletal: No acute or significant osseous findings. IMPRESSION: 1. A 2.1 x 2.5 cm complex hypodense lesion in the inferior pole of the right kidney concerning for neoplasm. Further characterization with MRI without and with contrast recommended. 2. Slight heterogeneous enhancement of the right renal parenchyma. Correlation with urinalysis recommended to evaluate for pyelonephritis. 3. No bowel obstruction. Normal appendix. 4. Patchy left lung base consolidation may represent atelectasis or scarring. Pneumonia is not excluded clinical correlation is recommended. 5. A 3 cm infrarenal abdominal aortic aneurysm. Recommend follow-up ultrasound every 3 years. This recommendation follows ACR consensus guidelines: White Paper of the ACR Incidental Findings Committee II on Vascular Findings. J Am Coll Radiol 2013; 10:789-794. 6.  Aortic Atherosclerosis (ICD10-I70.0). Electronically Signed   By: Elgie Collard M.D.   On: 03/19/2023 02:10   US RENAL  Result Date: 03/16/2023 CLINICAL DATA:  E coli bacteremia. EXAM: RENAL / URINARY TRACT ULTRASOUND COMPLETE COMPARISON:  None Available. FINDINGS: Right Kidney: Renal measurements: 11.0 x 4.6 x 6.2 cm = volume: 163.4 mL. No collecting system dilatation or perinephric fluid. There is an echogenic area identified with shadowing measuring 13 mm in the kidney consistent with a stone or calcification there is a near anechoic structure with through transmission peripherally in the  lower pole of the right kidney measuring 2.8 x 2.3 x 2.6 cm. Cyst. Left Kidney: Renal measurements: 12.0 x 5.9 x 6.7 cm = volume: 251.1 mL. Echogenicity within normal limits. No mass or hydronephrosis visualized. Bladder: Appears normal for degree of bladder distention. Other: If there is further concern of the sequela of infection to the kidneys, a contrast CT scan may be useful for higher sensitivity. IMPRESSION: No collecting system dilatation. Right-sided renal stone measuring 13 mm.  Lower pole cystic lesion. Electronically Signed   By: Karen Kays M.D.   On: 03/16/2023 19:52    Microbiology: Results for orders placed or performed during the hospital encounter of 03/04/23  SARS Coronavirus 2 by RT PCR (hospital order, performed in [facility] hospital lab) *cepheid single result test* Anterior Nasal Swab     Status: None   Collection Time: 03/14/23 11:13 AM   Specimen: Anterior Nasal Swab  Result Value Ref Range Status   SARS Coronavirus 2 by RT PCR NEGATIVE NEGATIVE Final    Comment: (NOTE) SARS-CoV-2 target nucleic acids are NOT DETECTED.  The SARS-CoV-2 RNA is generally detectable in upper and lower respiratory specimens during the acute phase of infection. The lowest concentration of SARS-CoV-2 viral copies this assay can detect is 250 copies /  mL. A negative result does not preclude SARS-CoV-2 infection and should not be used as the sole basis for treatment or other patient management decisions.  A negative result may occur with improper specimen collection / handling, submission of specimen other than nasopharyngeal swab, presence of viral mutation(s) within the areas targeted by this assay, and inadequate number of viral copies (<250 copies / mL). A negative result must be combined with clinical observations, patient history, and epidemiological information.  Fact Sheet for Patients:   RoadLapTop.co.za  Fact Sheet for Healthcare  Providers: http://kim-miller.com/  This test is not yet approved or  cleared by the [place] FDA and has been authorized for detection and/or diagnosis of SARS-CoV-2 by FDA under an Emergency Use Authorization (EUA).  This EUA will remain in effect (meaning this test can be used) for the duration of the COVID-19 declaration under Section 564(b)(1) of the Act, 21 U.S.C. section 360bbb-3(b)(1), unless the authorization is terminated or revoked sooner.  Performed at [facility], [address] [place], [place] [address]   Culture, blood (Routine X 2) w Reflex to ID Panel     Status: Abnormal   Collection Time: 03/14/23 12:45 PM   Specimen: BLOOD  Result Value Ref Range Status   Specimen Description   Final    BLOOD BLOOD LEFT HAND Performed at [facility], [address] [place], [place] [address]    Special Requests   Final    BOTTLES DRAWN AEROBIC AND ANAEROBIC Blood Culture adequate volume Performed at [facility], [address] [place], [place] [address]    Culture  Setup Time   Final    GRAM NEGATIVE RODS ANAEROBIC BOTTLE ONLY Organism ID to follow CRITICAL RESULT CALLED TO, READ BACK BY AND VERIFIED WITH: JASON ROBBINS @ 0322 03/14/23 LFD GRAM STAIN REVIEWED-AGREE WITH RESULT Performed at [facility], [address], [place], [place] [address]    Culture (A)  Final    ESCHERICHIA COLI Confirmed Extended Spectrum Beta-Lactamase Producer (ESBL).  In bloodstream infections from ESBL organisms, carbapenems are preferred over piperacillin/tazobactam. They are shown to have a lower risk of mortality.    Report Status 03/17/2023 FINAL  Final   Organism ID, Bacteria ESCHERICHIA COLI  Final      Susceptibility   Escherichia coli - MIC*    AMPICILLIN >=32 RESISTANT Resistant     CEFEPIME 16 RESISTANT Resistant     CEFTAZIDIME RESISTANT Resistant     CEFTRIAXONE >=64 RESISTANT Resistant     CIPROFLOXACIN >=4  RESISTANT Resistant     GENTAMICIN <=1 SENSITIVE Sensitive     IMIPENEM <=0.25 SENSITIVE Sensitive     TRIMETH/SULFA <=20 SENSITIVE Sensitive     AMPICILLIN/SULBACTAM >=32 RESISTANT Resistant     PIP/TAZO <=4 SENSITIVE Sensitive     * ESCHERICHIA COLI  Blood Culture ID Panel (Reflexed)     Status: Abnormal   Collection Time: 03/14/23 12:45 PM  Result Value Ref Range Status   Enterococcus faecalis NOT DETECTED NOT DETECTED Final   Enterococcus Faecium NOT DETECTED NOT DETECTED Final   Listeria monocytogenes NOT DETECTED NOT DETECTED Final   Staphylococcus species NOT DETECTED NOT DETECTED Final   Staphylococcus aureus (BCID) NOT DETECTED NOT DETECTED Final   Staphylococcus epidermidis NOT DETECTED NOT DETECTED Final   Staphylococcus lugdunensis NOT DETECTED NOT DETECTED Final   Streptococcus species NOT DETECTED NOT DETECTED Final   Streptococcus agalactiae NOT DETECTED NOT DETECTED Final   Streptococcus pneumoniae NOT DETECTED NOT DETECTED  Final   Streptococcus pyogenes NOT DETECTED NOT DETECTED Final   A.calcoaceticus-baumannii NOT DETECTED NOT DETECTED Final   Bacteroides fragilis NOT DETECTED NOT DETECTED Final   Enterobacterales DETECTED (A) NOT DETECTED Final    Comment: Enterobacterales represent a large order of gram negative bacteria, not a single organism. CRITICAL RESULT CALLED TO, READ BACK BY AND VERIFIED WITH: JASON ROBBINS @ 0322 03/14/23 LFD    Enterobacter cloacae complex NOT DETECTED NOT DETECTED Final   Escherichia coli DETECTED (A) NOT DETECTED Final    Comment: CRITICAL RESULT CALLED TO, READ BACK BY AND VERIFIED WITH: JASON ROBBINS @ 0322 03/14/23 LFD    Klebsiella aerogenes NOT DETECTED NOT DETECTED Final   Klebsiella oxytoca NOT DETECTED NOT DETECTED Final   Klebsiella pneumoniae NOT DETECTED NOT DETECTED Final   Proteus species NOT DETECTED NOT DETECTED Final   Salmonella species NOT DETECTED NOT DETECTED Final   Serratia marcescens NOT DETECTED NOT  DETECTED Final   Haemophilus influenzae NOT DETECTED NOT DETECTED Final   Neisseria meningitidis NOT DETECTED NOT DETECTED Final   Pseudomonas aeruginosa NOT DETECTED NOT DETECTED Final   Stenotrophomonas maltophilia NOT DETECTED NOT DETECTED Final   Candida albicans NOT DETECTED NOT DETECTED Final   Candida auris NOT DETECTED NOT DETECTED Final   Candida glabrata NOT DETECTED NOT DETECTED Final   Candida krusei NOT DETECTED NOT DETECTED Final   Candida parapsilosis NOT DETECTED NOT DETECTED Final   Candida tropicalis NOT DETECTED NOT DETECTED Final   Cryptococcus neoformans/gattii NOT DETECTED NOT DETECTED Final   CTX-M ESBL DETECTED (A) NOT DETECTED Final    Comment: CRITICAL RESULT CALLED TO, READ BACK BY AND VERIFIED WITH: JASON ROBBINS @ 0322 03/14/23 LFD (NOTE) Extended spectrum beta-lactamase detected. Recommend a carbapenem as initial therapy.      Carbapenem resistance IMP NOT DETECTED NOT DETECTED Final   Carbapenem resistance KPC NOT DETECTED NOT DETECTED Final   Carbapenem resistance NDM NOT DETECTED NOT DETECTED Final   Carbapenem resist OXA 48 LIKE NOT DETECTED NOT DETECTED Final   Carbapenem resistance VIM NOT DETECTED NOT DETECTED Final    Comment: Performed at [facility], [address] [place], [place] [address]  Culture, blood (Routine X 2) w Reflex to ID Panel     Status: Abnormal   Collection Time: 03/14/23 12:52 PM   Specimen: BLOOD  Result Value Ref Range Status   Specimen Description   Final    BLOOD BLOOD RIGHT HAND Performed at [facility], [address] [place], [place] [address]    Special Requests   Final    BOTTLES DRAWN AEROBIC AND ANAEROBIC Blood Culture adequate volume Performed at [facility], [address], [place], [place] [address]    Culture  Setup Time   Final    GRAM NEGATIVE RODS AEROBIC BOTTLE ONLY CRITICAL RESULT CALLED TO, READ BACK BY AND VERIFIED WITH: JASON ROBBINS @ 0322 0/25/24 LFD GRAM  STAIN REVIEWED-AGREE WITH RESULT    Culture (A)  Final    ESCHERICHIA COLI SUSCEPTIBILITIES PERFORMED ON PREVIOUS CULTURE WITHIN THE LAST 5 DAYS. Performed at [facility], [address] [place], [place] [address]    Report Status 03/17/2023 FINAL  Final  Urine Culture (for pregnant, neutropenic or urologic patients or patients with an indwelling urinary catheter)     Status: None   Collection Time: 03/16/23 10:02 PM   Specimen: Urine, Clean Catch  Result Value Ref Range Status   Specimen Description   Final  URINE, CLEAN CATCH Performed at [facility], [address], [place], [place] [address]    Special Requests   Final    NONE Performed at [facility], [address] [place], [place] [address]    Culture   Final    NO GROWTH Performed at [facility], [address] [place], [place] [address]    Report Status 03/18/2023 FINAL  Final  Culture, blood (Routine X 2) w Reflex to ID Panel     Status: None   Collection Time: 03/17/23  5:05 AM   Specimen: BLOOD  Result Value Ref Range Status   Specimen Description BLOOD LEFT HAND  Final   Special Requests IN PEDIATRIC BOTTLE Blood Culture adequate volume  Final   Culture   Final    NO GROWTH 5 DAYS Performed at [facility], [address], [place], [place] [address]    Report Status 03/22/2023 FINAL  Final  Culture, blood (Routine X 2) w Reflex to ID Panel     Status: None   Collection Time: 03/17/23  5:05 AM   Specimen: BLOOD  Result Value Ref Range Status   Specimen Description BLOOD LEFT FA  Final   Special Requests IN PEDIATRIC BOTTLE Blood Culture adequate volume  Final   Culture   Final    NO GROWTH 5 DAYS Performed at [facility], [address] [place], [place] [address]    Report Status 03/22/2023 FINAL  Final    Labs: CBC: No results for input(s): "WBC", "NEUTROABS", "HGB", "HCT", "MCV", "PLT" in the last 168 hours. Basic Metabolic Panel: No  results for input(s): "NA", "K", "CL", "CO2", "GLUCOSE", "BUN", "CREATININE", "CALCIUM", "MG", "PHOS" in the last 168 hours. Liver Function Tests: No results for input(s): "AST", "ALT", "ALKPHOS", "BILITOT", "PROT", "ALBUMIN" in the last 168 hours. CBG: Recent Labs  Lab 04/06/23 0511 04/06/23 0811 04/07/23 0537 04/08/23 0524 04/09/23 0429  GLUCAP 79 75 81 90 90    Discharge time spent: 39 minutes.  Signed: Marcelino Duster, MD [facility] 04/09/2023

## 2023-04-09 NOTE — Plan of Care (Signed)
  Problem: Education: Goal: Knowledge of condition and prescribed therapy will improve Outcome: Completed/Met   Problem: Cardiac: Goal: Will achieve and/or maintain adequate cardiac output Outcome: Completed/Met   Problem: Physical Regulation: Goal: Complications related to the disease process, condition or treatment will be avoided or minimized Outcome: Completed/Met   Problem: Education: Goal: Knowledge of General Education information will improve Description: Including pain rating scale, medication(s)/side effects and non-pharmacologic comfort measures Outcome: Completed/Met   Problem: Health Behavior/Discharge Planning: Goal: Ability to manage health-related needs will improve Outcome: Completed/Met   Problem: Clinical Measurements: Goal: Ability to maintain clinical measurements within normal limits will improve Outcome: Completed/Met Goal: Will remain free from infection Outcome: Completed/Met Goal: Diagnostic test results will improve Outcome: Completed/Met Goal: Cardiovascular complication will be avoided Outcome: Completed/Met   Problem: Activity: Goal: Risk for activity intolerance will decrease Outcome: Completed/Met   Problem: Nutrition: Goal: Adequate nutrition will be maintained Outcome: Completed/Met   Problem: Coping: Goal: Level of anxiety will decrease Outcome: Completed/Met   Problem: Elimination: Goal: Will not experience complications related to bowel motility Outcome: Completed/Met Goal: Will not experience complications related to urinary retention Outcome: Completed/Met   Problem: Pain Managment: Goal: General experience of comfort will improve Outcome: Completed/Met   Problem: Safety: Goal: Ability to remain free from injury will improve Outcome: Completed/Met   Problem: Skin Integrity: Goal: Risk for impaired skin integrity will decrease Outcome: Completed/Met

## 2023-04-09 NOTE — Plan of Care (Signed)
  Problem: Education: Goal: Knowledge of condition and prescribed therapy will improve Outcome: Progressing   Problem: Cardiac: Goal: Will achieve and/or maintain adequate cardiac output Outcome: Progressing   Problem: Physical Regulation: Goal: Complications related to the disease process, condition or treatment will be avoided or minimized Outcome: Progressing   Problem: Education: Goal: Knowledge of General Education information will improve Description: Including pain rating scale, medication(s)/side effects and non-pharmacologic comfort measures Outcome: Progressing   Problem: Health Behavior/Discharge Planning: Goal: Ability to manage health-related needs will improve Outcome: Progressing   Problem: Clinical Measurements: Goal: Ability to maintain clinical measurements within normal limits will improve Outcome: Progressing Goal: Will remain free from infection Outcome: Progressing Goal: Diagnostic test results will improve Outcome: Progressing Goal: Cardiovascular complication will be avoided Outcome: Progressing   Problem: Activity: Goal: Risk for activity intolerance will decrease Outcome: Progressing   Problem: Nutrition: Goal: Adequate nutrition will be maintained Outcome: Progressing   Problem: Coping: Goal: Level of anxiety will decrease Outcome: Progressing   Problem: Elimination: Goal: Will not experience complications related to bowel motility Outcome: Progressing Goal: Will not experience complications related to urinary retention Outcome: Progressing   Problem: Pain Managment: Goal: General experience of comfort will improve Outcome: Progressing   Problem: Safety: Goal: Ability to remain free from injury will improve Outcome: Progressing   Problem: Skin Integrity: Goal: Risk for impaired skin integrity will decrease Outcome: Progressing

## 2023-04-09 NOTE — TOC Progression Note (Signed)
Transition of Care [facility]) - Progression Note    Patient Details  Name: [name] MRN: [id number] Date of Birth: Aug 06, 1950  [facility] CM/SW Contact  Garret Reddish, RN Phone Number: 04/09/2023, 8:51 AM  Clinical Narrative:    Attempted to submit SNF authorization on yesterday.  Navi reports that DOB is showing to be wrong in the Epic system.    Confirm correct DOB with documents on document list.  SNF authorization submitted.    SNF authorization pending.  SNF pending auth ID- [id number].  I have spoken with [name].  He is agreeable to signing patient into SNF facility.  I have informed [facility] with [facility].    TOC will continue to follow for discharge planning.         Expected Discharge Plan and Services                                               Social Determinants of Health (SDOH) Interventions SDOH Screenings   Food Insecurity: Patient Unable To Answer (03/05/2023)  Housing: Patient Unable To Answer (03/05/2023)  Transportation Needs: Patient Unable To Answer (03/05/2023)  Utilities: Patient Unable To Answer (03/05/2023)  Depression (PHQ2-9): Low Risk  (12/12/2020)  Tobacco Use: High Risk (03/05/2023)    Readmission Risk Interventions     No data to display

## 2023-04-09 NOTE — TOC Progression Note (Signed)
Transition of Care [facility]) - Progression Note    Patient Details  Name: Coolidge Coffing MRN: [id number] Date of Birth: Mar 06, 1951  Transition of Care [facility]) CM/SW Contact  Garret Reddish, RN Phone Number: 04/09/2023, 2:36 PM  Clinical Narrative:     Patient has received SNF approval.  [facility] [id number], SNF authorization approved from 04-09-2023- 04-13-2023.  Next review date is 04-13-2023.    I have informed Allision, Admission Coordinator with [facility] that patient has been approved for SNF.  I have informed Allision that Mr. Tien Khoshaba will sign patient into the facility.  I have informed Mrs. Lekeet Stifforb, with DSS that patient will be discharged today to [facility] and Mrs. Timmy Deutschman will sign patient into the facility today.  I have informed Mr. Sester that he will be going to [facility] today.  I have informed Mr. Rayner that [facility] will transport him to the facility today.  I have also informed Mr. Knight Merklin that patient will transported to the facility today via [facility].  I have arranged ambulance transport via [facility].    I have informed staff nurse of the above information.          Expected Discharge Plan and Services         Expected Discharge Date: 04/09/23                                     Social Determinants of Health (SDOH) Interventions SDOH Screenings   Food Insecurity: Patient Unable To Answer (03/05/2023)  Housing: Patient Unable To Answer (03/05/2023)  Transportation Needs: Patient Unable To Answer (03/05/2023)  Utilities: Patient Unable To Answer (03/05/2023)  Depression (PHQ2-9): Low Risk  (12/12/2020)  Tobacco Use: High Risk (03/05/2023)    Readmission Risk Interventions     No data to display

## 2023-04-09 NOTE — TOC Progression Note (Signed)
Transition of Care [facility]) - CM/SW Discharge Note   Patient Details  Name: [name] MRN: [id number] Date of Birth: 1951-01-05  Transition [facility] Care [facility]) CM/SW Contact:  Garret Reddish, RN Phone Number: 04/09/2023, 2:58 PM   Clinical Narrative:     Call received from [facility].  They inform me that they are back up and will not be able to transport patient to [facility] today.    I have arranged ambulance transport with [facility].  Transport time is arranged for 10 pm.  I have informed Revonda Standard, Admission Coordinator with [employer].  She has alert her staff and will be able to accept patient tonight.    I have informed staff nurse of the above information.          Patient Goals and CMS Choice      Discharge Placement                         Discharge Plan and Services Additional resources added to the After Visit Summary for                                       Social Determinants of Health (SDOH) Interventions SDOH Screenings   Food Insecurity: Patient Unable To Answer (03/05/2023)  Housing: Patient Unable To Answer (03/05/2023)  Transportation Needs: Patient Unable To Answer (03/05/2023)  Utilities: Patient Unable To Answer (03/05/2023)  Depression (PHQ2-9): Low Risk  (12/12/2020)  Tobacco Use: High Risk (03/05/2023)     Readmission Risk Interventions     No data to display

## 2023-04-09 NOTE — Plan of Care (Signed)
EMS here to tsfer pt to [facility].  Called report to [place].  Pt going to [facility].  He was incontinent of B&B and we got him cleaned up before transport.

## 2023-04-09 NOTE — Progress Notes (Signed)
Patient report given to assigned RN. All questions are addressed via phone.

## 2023-05-06 ENCOUNTER — Encounter: Payer: Self-pay | Admitting: Internal Medicine

## 2023-05-06 ENCOUNTER — Other Ambulatory Visit: Payer: Self-pay

## 2023-05-06 ENCOUNTER — Inpatient Hospital Stay
Admission: EM | Admit: 2023-05-06 | Discharge: 2023-05-25 | DRG: 981 | Disposition: A | Discharge status: Skilled Nursing Facility | Payer: Medicare HMO | Attending: Internal Medicine | Admitting: Internal Medicine

## 2023-05-06 ENCOUNTER — Observation Stay: Payer: Medicare HMO

## 2023-05-06 ENCOUNTER — Emergency Department: Payer: Medicare HMO

## 2023-05-06 DIAGNOSIS — N401 Enlarged prostate with lower urinary tract symptoms: Secondary | ICD-10-CM

## 2023-05-06 DIAGNOSIS — L89313 Pressure ulcer of right buttock, stage 3: Secondary | ICD-10-CM | POA: Diagnosis present

## 2023-05-06 DIAGNOSIS — N400 Enlarged prostate without lower urinary tract symptoms: Secondary | ICD-10-CM | POA: Diagnosis present

## 2023-05-06 DIAGNOSIS — I10 Essential (primary) hypertension: Secondary | ICD-10-CM | POA: Diagnosis present

## 2023-05-06 DIAGNOSIS — E039 Hypothyroidism, unspecified: Secondary | ICD-10-CM | POA: Diagnosis present

## 2023-05-06 DIAGNOSIS — X58XXXA Exposure to other specified factors, initial encounter: Secondary | ICD-10-CM | POA: Diagnosis present

## 2023-05-06 DIAGNOSIS — N3000 Acute cystitis without hematuria: Secondary | ICD-10-CM | POA: Diagnosis not present

## 2023-05-06 DIAGNOSIS — E0590 Thyrotoxicosis, unspecified without thyrotoxic crisis or storm: Secondary | ICD-10-CM | POA: Diagnosis present

## 2023-05-06 DIAGNOSIS — F209 Schizophrenia, unspecified: Secondary | ICD-10-CM | POA: Diagnosis present

## 2023-05-06 DIAGNOSIS — L24A0 Irritant contact dermatitis due to friction or contact with body fluids, unspecified: Secondary | ICD-10-CM | POA: Diagnosis present

## 2023-05-06 DIAGNOSIS — E43 Unspecified severe protein-calorie malnutrition: Secondary | ICD-10-CM | POA: Diagnosis present

## 2023-05-06 DIAGNOSIS — L8990 Pressure ulcer of unspecified site, unspecified stage: Secondary | ICD-10-CM | POA: Insufficient documentation

## 2023-05-06 DIAGNOSIS — F17210 Nicotine dependence, cigarettes, uncomplicated: Secondary | ICD-10-CM | POA: Diagnosis present

## 2023-05-06 DIAGNOSIS — R531 Weakness: Principal | ICD-10-CM

## 2023-05-06 DIAGNOSIS — Z79899 Other long term (current) drug therapy: Secondary | ICD-10-CM

## 2023-05-06 DIAGNOSIS — U071 COVID-19: Principal | ICD-10-CM | POA: Diagnosis present

## 2023-05-06 DIAGNOSIS — I110 Hypertensive heart disease with heart failure: Secondary | ICD-10-CM | POA: Diagnosis present

## 2023-05-06 DIAGNOSIS — F319 Bipolar disorder, unspecified: Secondary | ICD-10-CM | POA: Diagnosis present

## 2023-05-06 DIAGNOSIS — N390 Urinary tract infection, site not specified: Secondary | ICD-10-CM | POA: Diagnosis present

## 2023-05-06 DIAGNOSIS — I5032 Chronic diastolic (congestive) heart failure: Secondary | ICD-10-CM | POA: Diagnosis present

## 2023-05-06 DIAGNOSIS — L03115 Cellulitis of right lower limb: Secondary | ICD-10-CM | POA: Diagnosis present

## 2023-05-06 DIAGNOSIS — S72141A Displaced intertrochanteric fracture of right femur, initial encounter for closed fracture: Secondary | ICD-10-CM | POA: Diagnosis present

## 2023-05-06 DIAGNOSIS — J449 Chronic obstructive pulmonary disease, unspecified: Secondary | ICD-10-CM | POA: Diagnosis not present

## 2023-05-06 DIAGNOSIS — E785 Hyperlipidemia, unspecified: Secondary | ICD-10-CM | POA: Diagnosis present

## 2023-05-06 DIAGNOSIS — F200 Paranoid schizophrenia: Secondary | ICD-10-CM | POA: Diagnosis present

## 2023-05-06 DIAGNOSIS — S72001A Fracture of unspecified part of neck of right femur, initial encounter for closed fracture: Secondary | ICD-10-CM | POA: Diagnosis present

## 2023-05-06 DIAGNOSIS — F03A3 Unspecified dementia, mild, with mood disturbance: Secondary | ICD-10-CM | POA: Diagnosis present

## 2023-05-06 DIAGNOSIS — F17200 Nicotine dependence, unspecified, uncomplicated: Secondary | ICD-10-CM | POA: Diagnosis present

## 2023-05-06 DIAGNOSIS — Z6825 Body mass index (BMI) 25.0-25.9, adult: Secondary | ICD-10-CM

## 2023-05-06 LAB — CBC WITH DIFFERENTIAL/PLATELET
Abs Immature Granulocytes: 0.06 10*3/uL (ref 0.00–0.07)
Basophils Absolute: 0 10*3/uL (ref 0.0–0.1)
Basophils Relative: 0 %
Eosinophils Absolute: 0 10*3/uL (ref 0.0–0.5)
Eosinophils Relative: 0 %
HCT: 38.7 % — ABNORMAL LOW (ref 39.0–52.0)
Hemoglobin: 11.9 g/dL — ABNORMAL LOW (ref 13.0–17.0)
Immature Granulocytes: 1 %
Lymphocytes Relative: 10 %
Lymphs Abs: 1.1 10*3/uL (ref 0.7–4.0)
MCH: 28.5 pg (ref 26.0–34.0)
MCHC: 30.7 g/dL (ref 30.0–36.0)
MCV: 92.6 fL (ref 80.0–100.0)
Monocytes Absolute: 0.9 10*3/uL (ref 0.1–1.0)
Monocytes Relative: 8 %
Neutro Abs: 9.3 10*3/uL — ABNORMAL HIGH (ref 1.7–7.7)
Neutrophils Relative %: 81 %
Platelets: 219 10*3/uL (ref 150–400)
RBC: 4.18 MIL/uL — ABNORMAL LOW (ref 4.22–5.81)
RDW: 13.6 % (ref 11.5–15.5)
WBC: 11.4 10*3/uL — ABNORMAL HIGH (ref 4.0–10.5)
nRBC: 0 % (ref 0.0–0.2)

## 2023-05-06 LAB — RESP PANEL BY RT-PCR (RSV, FLU A&B, COVID)  RVPGX2
Influenza A by PCR: NEGATIVE
Influenza B by PCR: NEGATIVE
Resp Syncytial Virus by PCR: NEGATIVE
SARS Coronavirus 2 by RT PCR: POSITIVE — AB

## 2023-05-06 LAB — COMPREHENSIVE METABOLIC PANEL
ALT: 13 U/L (ref 0–44)
AST: 20 U/L (ref 15–41)
Albumin: 2.7 g/dL — ABNORMAL LOW (ref 3.5–5.0)
Alkaline Phosphatase: 73 U/L (ref 38–126)
Anion gap: 9 (ref 5–15)
BUN: 20 mg/dL (ref 8–23)
CO2: 25 mmol/L (ref 22–32)
Calcium: 9.1 mg/dL (ref 8.9–10.3)
Chloride: 104 mmol/L (ref 98–111)
Creatinine, Ser: 0.81 mg/dL (ref 0.61–1.24)
GFR, Estimated: >60 mL/min (ref >60)
Glucose, Bld: 83 mg/dL (ref 70–99)
Potassium: 3.9 mmol/L (ref 3.5–5.1)
Sodium: 138 mmol/L (ref 135–145)
Total Bilirubin: 1.8 mg/dL — ABNORMAL HIGH (ref 0.3–1.2)
Total Protein: 7.4 g/dL (ref 6.5–8.1)

## 2023-05-06 LAB — URINALYSIS, W/ REFLEX TO CULTURE (INFECTION SUSPECTED)
Bilirubin Urine: NEGATIVE
Glucose, UA: NEGATIVE mg/dL
Hgb urine dipstick: NEGATIVE
Ketones, ur: 20 mg/dL — AB
Leukocytes,Ua: NEGATIVE
Nitrite: NEGATIVE
Protein, ur: NEGATIVE mg/dL
Specific Gravity, Urine: 1.027 (ref 1.005–1.030)
pH: 5 (ref 5.0–8.0)

## 2023-05-06 LAB — BRAIN NATRIURETIC PEPTIDE: B Natriuretic Peptide: 44.8 pg/mL (ref 0.0–100.0)

## 2023-05-06 LAB — LACTIC ACID, PLASMA: Lactic Acid, Venous: 0.8 mmol/L (ref 0.5–1.9)

## 2023-05-06 MED ORDER — SODIUM CHLORIDE 0.9 % IV BOLUS
1000.0000 mL | Freq: Once | INTRAVENOUS | Status: AC
  Administered 2023-05-06: 1000 mL via INTRAVENOUS

## 2023-05-06 MED ORDER — DM-GUAIFENESIN ER 30-600 MG PO TB12: 1.0000 | ORAL_TABLET | Freq: Two times a day (BID) | ORAL | Status: DC | PRN

## 2023-05-06 MED ORDER — VITAMIN D 25 MCG (1000 UNIT) PO TABS
1000.0000 [IU] | ORAL_TABLET | Freq: Every day | ORAL | Status: DC
  Administered 2023-05-07 – 2023-05-25 (×18): 1000 [IU] via ORAL
  Filled 2023-05-06 (×18): qty 1

## 2023-05-06 MED ORDER — SODIUM CHLORIDE 0.9 % IV SOLN: 1.0000 g | INTRAVENOUS | Status: DC

## 2023-05-06 MED ORDER — METHIMAZOLE 5 MG PO TABS
5.0000 mg | ORAL_TABLET | Freq: Three times a day (TID) | ORAL | Status: DC
  Administered 2023-05-06: 5 mg via ORAL
  Filled 2023-05-06: qty 1

## 2023-05-06 MED ORDER — HYDRALAZINE HCL 20 MG/ML IJ SOLN: 5.0000 mg | INTRAMUSCULAR | Status: DC | PRN

## 2023-05-06 MED ORDER — SODIUM CHLORIDE 0.9 % IV SOLN
1.0000 g | Freq: Once | INTRAVENOUS | Status: AC
  Administered 2023-05-06: 1 g via INTRAVENOUS
  Filled 2023-05-06: qty 10

## 2023-05-06 MED ORDER — ACETAMINOPHEN 325 MG PO TABS: 650.0000 mg | ORAL_TABLET | Freq: Four times a day (QID) | ORAL | Status: DC | PRN

## 2023-05-06 MED ORDER — TAMSULOSIN HCL 0.4 MG PO CAPS
0.4000 mg | ORAL_CAPSULE | Freq: Every day | ORAL | Status: DC
  Administered 2023-05-07 – 2023-05-24 (×19): 0.4 mg via ORAL
  Filled 2023-05-06 (×18): qty 1

## 2023-05-06 MED ORDER — NICOTINE 21 MG/24HR TD PT24
21.0000 mg | MEDICATED_PATCH | Freq: Every day | TRANSDERMAL | Status: DC
  Administered 2023-05-06 – 2023-05-25 (×17): 21 mg via TRANSDERMAL
  Filled 2023-05-06 (×20): qty 1

## 2023-05-06 MED ORDER — SODIUM CHLORIDE 0.9 % IV SOLN
1.0000 g | Freq: Three times a day (TID) | INTRAVENOUS | Status: DC
  Administered 2023-05-06 – 2023-05-08 (×5): 1 g via INTRAVENOUS
  Filled 2023-05-06 (×6): qty 20

## 2023-05-06 MED ORDER — CITALOPRAM HYDROBROMIDE 10 MG PO TABS
40.0000 mg | ORAL_TABLET | Freq: Every day | ORAL | Status: DC
  Administered 2023-05-06 – 2023-05-25 (×19): 40 mg via ORAL
  Filled 2023-05-06 (×12): qty 4
  Filled 2023-05-06: qty 2
  Filled 2023-05-06 (×6): qty 4

## 2023-05-06 MED ORDER — ONDANSETRON HCL 4 MG/2ML IJ SOLN: 4.0000 mg | Freq: Three times a day (TID) | INTRAMUSCULAR | Status: DC | PRN

## 2023-05-06 MED ORDER — METHIMAZOLE 5 MG PO TABS
5.0000 mg | ORAL_TABLET | Freq: Three times a day (TID) | ORAL | Status: DC
  Administered 2023-05-07 – 2023-05-25 (×55): 5 mg via ORAL
  Filled 2023-05-06 (×59): qty 1

## 2023-05-06 MED ORDER — FLUPHENAZINE HCL 5 MG PO TABS
5.0000 mg | ORAL_TABLET | Freq: Every day | ORAL | Status: DC
  Administered 2023-05-06: 5 mg via ORAL
  Filled 2023-05-06: qty 1

## 2023-05-06 MED ORDER — RISPERIDONE 1 MG PO TABS
1.0000 mg | ORAL_TABLET | Freq: Every day | ORAL | Status: DC
  Administered 2023-05-06 – 2023-05-25 (×19): 1 mg via ORAL
  Filled 2023-05-06 (×20): qty 1

## 2023-05-06 MED ORDER — ALBUTEROL SULFATE (2.5 MG/3ML) 0.083% IN NEBU: 2.5000 mg | INHALATION_SOLUTION | RESPIRATORY_TRACT | Status: DC | PRN

## 2023-05-06 MED ORDER — ENOXAPARIN SODIUM 40 MG/0.4ML IJ SOSY
40.0000 mg | PREFILLED_SYRINGE | INTRAMUSCULAR | Status: DC
  Administered 2023-05-06 – 2023-05-16 (×11): 40 mg via SUBCUTANEOUS
  Filled 2023-05-06 (×11): qty 0.4

## 2023-05-06 MED ORDER — SIMVASTATIN 20 MG PO TABS
20.0000 mg | ORAL_TABLET | Freq: Every day | ORAL | Status: DC
  Administered 2023-05-06 – 2023-05-25 (×19): 20 mg via ORAL
  Filled 2023-05-06 (×3): qty 1
  Filled 2023-05-06: qty 2
  Filled 2023-05-06 (×15): qty 1

## 2023-05-06 NOTE — ED Provider Notes (Signed)
-----------------------------------------   8:32 PM on 05/06/2023 ----------------------------------------- Patient care assumed from Dr. Rosalia Hammers.  We were finally able to get a urine sample back which does show mild amount of white blood cells and rare bacteria, we will cover with antibiotics as a precaution and send a urine culture.  Patient is chemistry shows no significant findings CBC shows slight leukocytosis but otherwise reassuring.  CT scan head shows no acute finding.  We attempted to get the patient up to ambulate he is unable to ambulate at this time due to generalized weakness.  Unfortunately the patient is new to his current group home and it is not entirely clear the patient's baseline.  He remains mildly tachycardic around 115 bpm we will dose a liter of fluid and reassess.  If the patient is still unable to ambulate patient may require admission to the hospital service for workup and treatment.  Patient's COVID test is positive likely explaining the patient's symptoms.  Will admit the patient to the hospitalist for further workup and treatment.   Minna Antis, MD 05/06/23 2124

## 2023-05-06 NOTE — ED Notes (Signed)
Pt given bed bath and changed into clean gown by this RN and Pete Glatter, Charity fundraiser. Condom catheter placed for urine sample collection. Stage two bedsores note on pt's bottom. Mepilex applied. Bilateral lower leg swelling noted.

## 2023-05-06 NOTE — ED Notes (Signed)
Pt given water to drink.

## 2023-05-06 NOTE — ED Triage Notes (Signed)
Pt from a newly established group home via [facility]. Per caregiver, pt was found sitting in his urine this morning. Unknown baseline mentation. Pt normally walks per caregiver but could not get up on his own today. Pt answers with one words but disoriented x4.  EMS vitals: BP 174/93 HR 91 Temp 98 F oral 90% RA -> 98% 2L [place]

## 2023-05-06 NOTE — H&P (Signed)
History and Physical    [name] ZOX:[id number] DOB: Jan 30, 1951 DOA: 05/06/2023  Referring MD/NP/PA:   PCP: Koren Bound, NP   Patient coming from:  The patient is coming from group home.     Chief Complaint: weakness  HPI: [name] is a 72 y.o. male with medical history significant of dementia, bipolar, schizophrenia, hypertension, hyperlipidemia, COPD, diastolic CHF, stroke, hypothyroidism, UTI-ESBL, who presents with weakness.  Patient has hx of dementia, and is able to provide very limited medical history. Most of the history is obtained by discussing the case with ED physician, per EMS report, and with the nursing staff.   Per report, pt was found to be sitting on the couch since yesterday.  He was found covered in urine soaked blanket in AM. He is very weak. When I saw pt in ED, he knows his own name, and knows that he is in hospital, confused about the time. Not sure about his baseline mental status.  During the interview, he has mild dry cough, no respiratory distress.  No active nausea, vomiting, diarrhea noted.  He denies chest pain or abdominal pain.  He denies dysuria and burning on urination.  He moves all extremities.  No facial droop or slurred speech.  Patient is calm during interview.   Data reviewed independently and ED Course: pt was found to have positive covid19 PCR, WBC 11.4, lactic acid 0.8, urinalysis (hazy appearance, negative leukocyte, rare bacteria, WBC 11-20), GFR> 60, temperature normal, blood pressure 128/70, heart rate 116, RR 21--> 19, oxygen saturation 95% on room air.  CT head negative.  Patient is placed on telemetry bed for observation.   EKG: I have personally reviewed.  Sinus rhythm, QTc 477, LAD, poor R wave progression, anteroseptal infarction pattern   Review of Systems: Could not be reviewed accurately due to dementia.   Allergy:  Allergies  Allergen Reactions   Asa [Aspirin]     Hx GI bleed    Past Medical History:   Diagnosis Date   Bipolar disorder (HCC)    COPD (chronic obstructive pulmonary disease) (HCC)    Depression    GERD (gastroesophageal reflux disease)    GIB (gastrointestinal bleeding)    Hyperlipidemia    Hypertension    Mild dementia (HCC)    Paranoid schizophrenia (HCC)    Smoker    Tobacco dependence     History reviewed. No pertinent surgical history.    Social History:  reports that he has been smoking cigarettes. He has never used smokeless tobacco. He reports that he does not drink alcohol and does not use drugs.  Family History: Could not be reviewed accurately due to dementia Family History  Family history unknown: Yes     Prior to Admission medications   Medication Sig Start Date End Date Taking? Authorizing Provider  Cholecalciferol (VITAMIN D3) 1000 units CAPS Take 1 capsule (1,000 Units total) by mouth daily. 08/05/17   Plonk, Chrissie Noa, MD  citalopram (CELEXA) 40 MG tablet Take 1 tablet (40 mg total) by mouth daily. 09/18/20   Zena Amos, MD  fluPHENAZine (PROLIXIN) 5 MG tablet Take 1 tablet (5 mg total) by mouth daily. 09/18/20   Zena Amos, MD  methimazole (TAPAZOLE) 5 MG tablet Take 5 mg by mouth 3 (three) times daily. 01/11/23   [provider]  risperiDONE (RISPERDAL) 1 MG tablet Take 1 tablet (1 mg total) by mouth daily. 09/18/20   Zena Amos, MD  simvastatin (ZOCOR) 20 MG tablet TAKE 1 TABLET BY MOUTH DAILY  05/31/17   Plonk, Chrissie Noa, MD  tamsulosin (FLOMAX) 0.4 MG CAPS capsule TAKE 1 CAPSULE BY MOUTH DAILY AFTER SUPPER 12/11/16   Schuyler Amor, MD    Physical Exam: Vitals:   05/06/23 2130 05/06/23 2200 05/06/23 2230 05/06/23 2329  BP: (!) 124/98 (!) 140/78 125/68 (!) 144/66  Pulse: (!) 116 (!) 105 100 (!) 107  Resp: 17 13 13 20   Temp:    98.8 F (37.1 C)  TempSrc:      SpO2: 95% 95% 96% 97%  Weight:      Height:       General: Not in acute distress HEENT:       Eyes: PERRL, EOMI, no jaundice       ENT: No discharge from the ears and  nose, no pharynx injection, no tonsillar enlargement.        Neck: No JVD, no bruit, no mass felt. Heme: No neck lymph node enlargement. Cardiac: S1/S2, RRR, No murmurs, No gallops or rubs. Respiratory: No rales, wheezing, rhonchi or rubs. GI: Soft, nondistended, nontender, no organomegaly, BS present. GU: No hematuria Ext: No pitting leg edema bilaterally. 1+DP/PT pulse bilaterally. Musculoskeletal: No joint deformities, No joint redness or warmth, no limitation of ROM in spin. Skin: No rashes.  Neuro: Patient is lethargic, knows his own name, knows that he is in hospital, confused about time. cranial nerves II-XII grossly intact, moves all extremities  Psych: Patient is calm  Labs on Admission: I have personally reviewed following labs and imaging studies  CBC: Recent Labs  Lab 05/06/23 1030  WBC 11.4*  NEUTROABS 9.3*  HGB 11.9*  HCT 38.7*  MCV 92.6  PLT 219   Basic Metabolic Panel: Recent Labs  Lab 05/06/23 1150  NA 138  K 3.9  CL 104  CO2 25  GLUCOSE 83  BUN 20  CREATININE 0.81  CALCIUM 9.1   GFR: Estimated Creatinine Clearance: 82.4 mL/min (by C-G formula based on SCr of 0.81 mg/dL). Liver Function Tests: Recent Labs  Lab 05/06/23 1150  AST 20  ALT 13  ALKPHOS 73  BILITOT 1.8*  PROT 7.4  ALBUMIN 2.7*   No results for input(s): "LIPASE", "AMYLASE" in the last 168 hours. No results for input(s): "AMMONIA" in the last 168 hours. Coagulation Profile: No results for input(s): "INR", "PROTIME" in the last 168 hours. Cardiac Enzymes: No results for input(s): "CKTOTAL", "CKMB", "CKMBINDEX", "TROPONINI" in the last 168 hours. BNP (last 3 results) No results for input(s): "PROBNP" in the last 8760 hours. HbA1C: No results for input(s): "HGBA1C" in the last 72 hours. CBG: No results for input(s): "GLUCAP" in the last 168 hours. Lipid Profile: No results for input(s): "CHOL", "HDL", "LDLCALC", "TRIG", "CHOLHDL", "LDLDIRECT" in the last 72 hours. Thyroid  Function Tests: No results for input(s): "TSH", "T4TOTAL", "FREET4", "T3FREE", "THYROIDAB" in the last 72 hours. Anemia Panel: No results for input(s): "VITAMINB12", "FOLATE", "FERRITIN", "TIBC", "IRON", "RETICCTPCT" in the last 72 hours. Urine analysis:    Component Value Date/Time   COLORURINE AMBER (A) 05/06/2023 1858   APPEARANCEUR HAZY (A) 05/06/2023 1858   APPEARANCEUR Clear 01/03/2013 0232   LABSPEC 1.027 05/06/2023 1858   LABSPEC 1.006 01/03/2013 0232   PHURINE 5.0 05/06/2023 1858   GLUCOSEU NEGATIVE 05/06/2023 1858   GLUCOSEU Negative 01/03/2013 0232   HGBUR NEGATIVE 05/06/2023 1858   BILIRUBINUR NEGATIVE 05/06/2023 1858   BILIRUBINUR Negative 01/03/2013 0232   KETONESUR 20 (A) 05/06/2023 1858   PROTEINUR NEGATIVE 05/06/2023 1858   NITRITE NEGATIVE 05/06/2023 1858  LEUKOCYTESUR NEGATIVE 05/06/2023 1858   LEUKOCYTESUR Negative 01/03/2013 0232   Sepsis Labs: @LABRCNTIP (procalcitonin:4,lacticidven:4) ) Recent Results (from the past 240 hour(s))  Resp panel by RT-PCR (RSV, Flu A&B, Covid) Anterior Nasal Swab     Status: Abnormal   Collection Time: 05/06/23  8:12 PM   Specimen: Anterior Nasal Swab  Result Value Ref Range Status   SARS Coronavirus 2 by RT PCR POSITIVE (A) NEGATIVE Final    Comment: (NOTE) SARS-CoV-2 target nucleic acids are DETECTED.  The SARS-CoV-2 RNA is generally detectable in upper respiratory specimens during the acute phase of infection. Positive results are indicative of the presence of the identified virus, but do not rule out bacterial infection or co-infection with other pathogens not detected by the test. Clinical correlation with patient history and other diagnostic information is necessary to determine patient infection status. The expected result is Negative.  Fact Sheet for Patients: BloggerCourse.com  Fact Sheet for Healthcare Providers: SeriousBroker.it  This test is not yet  approved or cleared by the [place] FDA and  has been authorized for detection and/or diagnosis of SARS-CoV-2 by FDA under an Emergency Use Authorization (EUA).  This EUA will remain in effect (meaning this test can be used) for the duration of  the COVID-19 declaration under Section 564(b)(1) of the A ct, 21 U.S.C. section 360bbb-3(b)(1), unless the authorization is terminated or revoked sooner.     Influenza A by PCR NEGATIVE NEGATIVE Final   Influenza B by PCR NEGATIVE NEGATIVE Final    Comment: (NOTE) The Xpert Xpress SARS-CoV-2/FLU/RSV plus assay is intended as an aid in the diagnosis of influenza from Nasopharyngeal swab specimens and should not be used as a sole basis for treatment. Nasal washings and aspirates are unacceptable for Xpert Xpress SARS-CoV-2/FLU/RSV testing.  Fact Sheet for Patients: BloggerCourse.com  Fact Sheet for Healthcare Providers: SeriousBroker.it  This test is not yet approved or cleared by the [place] FDA and has been authorized for detection and/or diagnosis of SARS-CoV-2 by FDA under an Emergency Use Authorization (EUA). This EUA will remain in effect (meaning this test can be used) for the duration of the COVID-19 declaration under Section 564(b)(1) of the Act, 21 U.S.C. section 360bbb-3(b)(1), unless the authorization is terminated or revoked.     Resp Syncytial Virus by PCR NEGATIVE NEGATIVE Final    Comment: (NOTE) Fact Sheet for Patients: BloggerCourse.com  Fact Sheet for Healthcare Providers: SeriousBroker.it  This test is not yet approved or cleared by the [place] FDA and has been authorized for detection and/or diagnosis of SARS-CoV-2 by FDA under an Emergency Use Authorization (EUA). This EUA will remain in effect (meaning this test can be used) for the duration of the COVID-19 declaration under Section 564(b)(1) of  the Act, 21 U.S.C. section 360bbb-3(b)(1), unless the authorization is terminated or revoked.  Performed at [facility], [address] [place], [place] [address]      Radiological Exams on Admission: CT Head Wo Contrast  Result Date: 05/06/2023 CLINICAL DATA:  Mental status change, unknown cause EXAM: CT HEAD WITHOUT CONTRAST TECHNIQUE: Contiguous axial images were obtained from the base of the skull through the vertex without intravenous contrast. RADIATION DOSE REDUCTION: This exam was performed according to the departmental dose-optimization program which includes automated exposure control, adjustment of the mA and/or kV according to patient size and/or use of iterative reconstruction technique. COMPARISON:  MRI head 03/05/2023.  CT head 03/04/2023. FINDINGS: Brain: No evidence of acute infarction, hemorrhage, hydrocephalus, extra-axial collection or mass  lesion/mass effect. Similar chronic microvascular ischemic disease and remote infarcts in the right basal ganglia, pons, and white matter. Vascular: Calcific atherosclerosis. No hyperdense vessel identified. Skull: No acute fracture. Sinuses/Orbits: Mild paranasal sinus mucosal thickening. No acute orbital findings. Other: No mastoid effusions. IMPRESSION: 1. No evidence of acute intracranial abnormality. 2. Chronic microvascular ischemic disease. Electronically Signed   By: Feliberto Harts M.D.   On: 05/06/2023 12:48      Assessment/Plan Principal Problem:   COVID-19 virus infection Active Problems:   UTI (urinary tract infection)   COPD (chronic obstructive pulmonary disease) (HCC)   Essential hypertension   Hyperlipidemia   Chronic diastolic CHF (congestive heart failure) (HCC)   Hyperthyroidism   Bipolar 1 disorder (HCC)   Schizophrenia (HCC)   BPH (benign prostatic hyperplasia)   Smoker   Assessment and Plan:   COVID-19 virus infection: Patient has positive COVID-19, no respiratory distress, no oxygen  desaturation.  Patient has mild dry cough during the interview.  -Placed on telemetry bed for observation -Bronchodilators and as needed Mucinex -Supportive care -Follow-up chest x-ray  UTI (urinary tract infection): Has mild leukocytosis with WBC 11.4, heart rate of 114, transient tachypnea with RR 21 --> 19.  Lactic acid normal.  Clinically does not seem to have sepsis. -Meropenem (patient received 1 dose of Rocephin) -Follow-up of blood culture and urine culture -IV fluid: Patient received 2 L normal saline in ED  COPD (chronic obstructive pulmonary disease) (HCC) -Bronchodilators and as needed Mucinex  Essential hypertension: Patient is not taking medications.  Blood pressure 128/70 -IV hydralazine as needed  Hyperlipidemia -Zocor  Chronic diastolic CHF (congestive heart failure) (HCC): 2D echo on 03/05/2023 showed EF of 60 to 65% with grade 1 diastolic dysfunction.  Patient does not have leg edema JVD.  BNP is normal 44.8.  CHF is compensated. -Watch volume status closely  Hyperthyroidism -Continue home methimazole  Bipolar 1 disorder (HCC) and schizophrenia (HCC): Patient is calm currently -Celexa, risperidone -Patient is not taking fluphenazine currently  BPH (benign prostatic hyperplasia) -Flomax  Smoker -Nicotine patch      DVT ppx: SQ Lovenox  Code Status: Full code     Family Communication: not done, no family member is at bed side.      Disposition Plan:  Anticipate discharge back to previous environment, group home  Consults called:  none  Admission status and Level of care: Telemetry Medical:    for obs    Dispo: The patient is from: Group home              Anticipated d/c is to: Group home              Anticipated d/c date is: 1 day              Patient currently is not medically stable to d/c.    Severity of Illness:  The appropriate patient status for this patient is OBSERVATION. Observation status is judged to be reasonable and necessary  in order to provide the required intensity of service to ensure the patient's safety. The patient's presenting symptoms, physical exam findings, and initial radiographic and laboratory data in the context of their medical condition is felt to place them at decreased risk for further clinical deterioration. Furthermore, it is anticipated that the patient will be medically stable for discharge from the hospital within 2 midnights of admission.        Date of Service 05/06/2023    Lorretta Harp [facility]   If  7PM-7AM, please contact night-coverage [email or web address] 05/06/2023, 11:50 PM

## 2023-05-06 NOTE — Progress Notes (Signed)
Pharmacy Antibiotic Note  [name] is a 72 y.o. male admitted on 05/06/2023 with UTI with history of ESBL. Pharmacy has been consulted for meropenem dosing.  Plan: Meropenem 1 gram every 8 hours  Height: 5\' 9"  (175.3 cm) Weight: 82.8 kg (182 lb 8 oz) IBW/kg (Calculated) : 70.7  Temp (24hrs), Avg:98.5 F (36.9 C), Min:98.4 F (36.9 C), Max:98.6 F (37 C)  Recent Labs  Lab 05/06/23 1030 05/06/23 1150  WBC 11.4*  --   CREATININE  --  0.81    Estimated Creatinine Clearance: 82.4 mL/min (by C-G formula based on SCr of 0.81 mg/dL).    Allergies  Allergen Reactions   Asa [Aspirin]     Hx GI bleed    Antimicrobials this admission: 10/17 meropenem >>  Dose adjustments this admission: N/a  Microbiology results: 10/17 BCx: to be collected 10/17 UCx: in process   Thank you for allowing pharmacy to be a part of this patient's care.   Elliot Gurney, PharmD, BCPS Clinical Pharmacist  05/06/2023 9:37 PM

## 2023-05-06 NOTE — ED Notes (Signed)
Pt given ice water to drink.  RN attempted to ambulate patient.  Pt was not able to move legs without assistance.  RN did not stand patient due to fall concerns.  MD made aware.

## 2023-05-06 NOTE — ED Provider Notes (Signed)
[facility] Provider Note    Event Date/Time   First MD Initiated Contact with Patient 05/06/23 1021     (approximate)   History   Altered Mental Status   HPI  [name] is a 72 year old male with history of hypertension, bipolar disorder, COPD, schizophrenia, cognitive deficit presenting to the emergency department for evaluation of weakness.  Patient able to tell me his name and that we are in the hospital, is unsure why he is here.  Denies pain or any complaints.  EMS reports that patient is in a group home and was found to be sitting on the couch since yesterday.  Normally ambulatory, but was unable to get up today.  Was found covered in urine soaked blanket.  I did review his discharge summary from 04/09/2023.  At that time, patient presented with syncope.  Cultures grew ESBL E. coli for which patient was treated with meropenem and ertapenem.       Physical Exam   Triage Vital Signs: ED Triage Vitals  Encounter Vitals Group     BP      Systolic BP Percentile      Diastolic BP Percentile      Pulse      Resp      Temp      Temp src      SpO2      Weight      Height      Head Circumference      Peak Flow      Pain Score      Pain Loc      Pain Education      Exclude from Growth Chart     Most recent vital signs: Vitals:   05/06/23 1430 05/06/23 1500  BP: (!) 148/67 (!) 157/70  Pulse: 86 91  Resp: 10 16  Temp:    SpO2: 96% 94%     General: Awake, interactive  CV:  Regular rate, good peripheral perfusion.  Resp:  Lungs clear, unlabored respirations.  Abd:  Soft, nondistended, no appreciable tenderness to palpation GU:  Inguinal area saturated with urine, no appreciable scrotal tenderness or penile discharge Neuro:  No gross facial asymmetry, oriented to self, able to tell me where in the hospital, not oriented to time or situation   ED Results / Procedures / Treatments   Labs (all labs ordered are listed, but only  abnormal results are displayed) Labs Reviewed  CBC WITH DIFFERENTIAL/PLATELET - Abnormal; Notable for the following components:      Result Value   WBC 11.4 (*)    RBC 4.18 (*)    Hemoglobin 11.9 (*)    HCT 38.7 (*)    Neutro Abs 9.3 (*)    All other components within normal limits  COMPREHENSIVE METABOLIC PANEL - Abnormal; Notable for the following components:   Albumin 2.7 (*)    Total Bilirubin 1.8 (*)    All other components within normal limits  URINALYSIS, W/ REFLEX TO CULTURE (INFECTION SUSPECTED)     EKG EKG independently reviewed interpreted by myself (ER attending) demonstrates:  EKG demonstrate sinus rhythm at rate of 97, PR 172, QRS 115, QTc 477, no acute ST changes  RADIOLOGY Imaging independently reviewed and interpreted by myself demonstrates:  CT head without acute bleed  PROCEDURES:  Critical Care performed: No  Procedures   MEDICATIONS ORDERED IN ED: Medications  sodium chloride 0.9 % bolus 1,000 mL (0 mLs Intravenous Stopped 05/06/23 1526)  IMPRESSION / MDM / ASSESSMENT AND PLAN / ED COURSE  I reviewed the triage vital signs and the nursing notes.  Differential diagnosis includes, but is not limited to, anemia, electrolyte abnormality, UTI, baseline cognitive deficit, acute intracranial process  Patient's presentation is most consistent with acute presentation with potential threat to life or bodily function.  72 year old male presenting with weakness, possible confusion from baseline.  Tachycardic on presentation, improved after IV fluids.  Labs with mild leukocytosis and anemia. Urinalysis pending.  CT head without acute findings.  Patient does have a history of MDRO UTI, with reported malodorous urine on scene, so will await urinalysis to determine further disposition.  Signed out to oncoming provider at 1515, pending urine, reevaluation, and disposition.      FINAL CLINICAL IMPRESSION(S) / ED DIAGNOSES   Final diagnoses:  Generalized  weakness     Rx / DC Orders   ED Discharge Orders     None        Note:  This document was prepared using Dragon voice recognition software and may include unintentional dictation errors.   Trinna Post, MD 05/06/23 [phone]

## 2023-05-07 ENCOUNTER — Observation Stay: Payer: Medicare HMO

## 2023-05-07 ENCOUNTER — Encounter: Payer: Self-pay | Admitting: Internal Medicine

## 2023-05-07 DIAGNOSIS — E43 Unspecified severe protein-calorie malnutrition: Secondary | ICD-10-CM | POA: Insufficient documentation

## 2023-05-07 DIAGNOSIS — L8990 Pressure ulcer of unspecified site, unspecified stage: Secondary | ICD-10-CM | POA: Insufficient documentation

## 2023-05-07 DIAGNOSIS — U071 COVID-19: Secondary | ICD-10-CM | POA: Diagnosis not present

## 2023-05-07 DIAGNOSIS — N3000 Acute cystitis without hematuria: Secondary | ICD-10-CM | POA: Diagnosis not present

## 2023-05-07 DIAGNOSIS — L03115 Cellulitis of right lower limb: Secondary | ICD-10-CM | POA: Diagnosis present

## 2023-05-07 LAB — BASIC METABOLIC PANEL
Anion gap: 6 (ref 5–15)
BUN: 15 mg/dL (ref 8–23)
CO2: 24 mmol/L (ref 22–32)
Calcium: 8.2 mg/dL — ABNORMAL LOW (ref 8.9–10.3)
Chloride: 107 mmol/L (ref 98–111)
Creatinine, Ser: 0.7 mg/dL (ref 0.61–1.24)
GFR, Estimated: >60 mL/min (ref >60)
Glucose, Bld: 68 mg/dL — ABNORMAL LOW (ref 70–99)
Potassium: 3.5 mmol/L (ref 3.5–5.1)
Sodium: 137 mmol/L (ref 135–145)

## 2023-05-07 LAB — CBC
HCT: 28 % — ABNORMAL LOW (ref 39.0–52.0)
Hemoglobin: 8.9 g/dL — ABNORMAL LOW (ref 13.0–17.0)
MCH: 28.8 pg (ref 26.0–34.0)
MCHC: 31.8 g/dL (ref 30.0–36.0)
MCV: 90.6 fL (ref 80.0–100.0)
Platelets: 169 10*3/uL (ref 150–400)
RBC: 3.09 MIL/uL — ABNORMAL LOW (ref 4.22–5.81)
RDW: 13.8 % (ref 11.5–15.5)
WBC: 6.2 10*3/uL (ref 4.0–10.5)
nRBC: 0 % (ref 0.0–0.2)

## 2023-05-07 LAB — URINE CULTURE: Culture: NO GROWTH

## 2023-05-07 LAB — PROTIME-INR
INR: 1.3 — ABNORMAL HIGH (ref 0.8–1.2)
Prothrombin Time: 16.3 s — ABNORMAL HIGH (ref 11.4–15.2)

## 2023-05-07 LAB — PROCALCITONIN: Procalcitonin: 1.58 ng/mL

## 2023-05-07 MED ORDER — ENSURE ENLIVE PO LIQD
237.0000 mL | Freq: Three times a day (TID) | ORAL | Status: DC
  Administered 2023-05-07 – 2023-05-25 (×45): 237 mL via ORAL

## 2023-05-07 MED ORDER — MORPHINE SULFATE (PF) 2 MG/ML IV SOLN: 2.0000 mg | INTRAVENOUS | Status: DC | PRN

## 2023-05-07 MED ORDER — VITAMIN C 500 MG PO TABS
500.0000 mg | ORAL_TABLET | Freq: Two times a day (BID) | ORAL | Status: DC
  Administered 2023-05-07 – 2023-05-25 (×35): 500 mg via ORAL
  Filled 2023-05-07 (×35): qty 1

## 2023-05-07 MED ORDER — ADULT MULTIVITAMIN W/MINERALS CH
1.0000 | ORAL_TABLET | Freq: Every day | ORAL | Status: DC
  Administered 2023-05-07 – 2023-05-25 (×18): 1 via ORAL
  Filled 2023-05-07 (×18): qty 1

## 2023-05-07 MED ORDER — IOHEXOL 300 MG/ML  SOLN
100.0000 mL | Freq: Once | INTRAMUSCULAR | Status: AC | PRN
  Administered 2023-05-07: 100 mL via INTRAVENOUS

## 2023-05-07 MED ORDER — ZINC SULFATE 220 (50 ZN) MG PO CAPS
220.0000 mg | ORAL_CAPSULE | Freq: Every day | ORAL | Status: DC
  Administered 2023-05-07 – 2023-05-20 (×13): 220 mg via ORAL
  Filled 2023-05-07 (×13): qty 1

## 2023-05-07 NOTE — Progress Notes (Signed)
Initial Nutrition Assessment  DOCUMENTATION CODES:   Severe malnutrition in context of chronic illness  INTERVENTION:   -Downgrade diet to dysphagia 3 for ease of intake -Feeding assistance with meals -Ensure Enlive po TID, each supplement provides 350 kcal and 20 grams of protein -MVI with minerals daily -500 mg vitamin C BID -220 mg zinc sulfate daily x 14 days  NUTRITION DIAGNOSIS:   Severe Malnutrition related to chronic illness (dementia) as evidenced by moderate fat depletion, severe fat depletion, moderate muscle depletion, severe muscle depletion, percent weight loss.  GOAL:   Patient will meet greater than or equal to 90% of their needs  MONITOR:   PO intake, Supplement acceptance  REASON FOR ASSESSMENT:   Malnutrition Screening Tool    ASSESSMENT:   Pt with medical history significant of dementia, bipolar, schizophrenia, hypertension, hyperlipidemia, COPD, diastolic CHF, stroke, hypothyroidism, UTI-ESBL, who presents with weakness.  Pt admitted with COVID-19 infection and UTI.   Reviewed I/O's: +100 ml x 24 hours  Per H&P, pt was found at home covered in his own urine.    Spoke with pt at bedside, who was pleasant and in good spirits today. Pt is a poor historian and unable to answer most questions, but cooperative during interview. He remembers eating breakfast and only complaint was that it wasn't "sugary" enough. He shares he had good oral intake at home, but unable to provide diet recall or how often he eats. Noted pt with multiple missing teeth. Pt acknowledges this, but shares that it is not difficult to chew or swallow food despite this. He is currently on a heart healthy diet.   Pt unable to provide weight history. Reviewed wt hx; pt has experienced a 7.8% wt loss over the past 2 months, which is significant for time frame.   Pt would greatly benefit from addition of oral nutrition supplements secondary to malnutrition and pressure injury.    Medications reviewed and include vitamin D3.   Labs reviewed.   NUTRITION - FOCUSED PHYSICAL EXAM:  Flowsheet Row Most Recent Value  Orbital Region Moderate depletion  Upper Arm Region Severe depletion  Thoracic and Lumbar Region Severe depletion  Buccal Region Moderate depletion  Temple Region Severe depletion  Clavicle Bone Region Moderate depletion  Clavicle and Acromion Bone Region Moderate depletion  Scapular Bone Region Moderate depletion  Dorsal Hand Severe depletion  Patellar Region Moderate depletion  Anterior Thigh Region Moderate depletion  Posterior Calf Region Moderate depletion  Edema (RD Assessment) Mild  Hair Reviewed  Eyes Reviewed  Mouth Reviewed  Skin Reviewed  Nails Reviewed       Diet Order:   Diet Order             DIET DYS 3 Fluid consistency: Thin  Diet effective now                   EDUCATION NEEDS:   No education needs have been identified at this time  Skin:  Skin Assessment: Skin Integrity Issues: Skin Integrity Issues:: Stage II Stage II: coccyx  Last BM:  05/06/23  Height:   Ht Readings from Last 1 Encounters:  05/06/23 5\' 9"  (1.753 m)    Weight:   Wt Readings from Last 1 Encounters:  05/06/23 82.8 kg    Ideal Body Weight:  72.7 kg  BMI:  Body mass index is 26.95 kg/m.  Estimated Nutritional Needs:   Kcal:  2200-2300  Protein:  110-125 grams  Fluid:  > 2 L  Levada Schilling, RD, LDN, CDCES Registered Dietitian III Certified Diabetes Care and Education Specialist Please refer to [facility] for RD and/or RD on-call/weekend/after hours pager

## 2023-05-07 NOTE — Progress Notes (Signed)
   05/07/23 1530  Unsuccessful Nursing Procedure/Treatment  Type of Nursing Procedure/Treatment Peripheral IV insertion  Number of attempts 3     ICU charge called regarding unsuccessful PIV.

## 2023-05-07 NOTE — Plan of Care (Signed)

## 2023-05-07 NOTE — Progress Notes (Signed)
Received verbal response from doctor that patient can have a male pure wick.

## 2023-05-07 NOTE — Progress Notes (Signed)
Pharmacy Antibiotic Note  [name] is a 72 y.o. male admitted on 05/06/2023 with UTI with history of ESBL. Pharmacy has been consulted for meropenem dosing.  Plan: Continue meropenem 1 gram every 8 hours Follow renal function and culture results for adjustments  Height: 5\' 9"  (175.3 cm) Weight: 82.8 kg (182 lb 8 oz) IBW/kg (Calculated) : 70.7  Temp (24hrs), Avg:98.6 F (37 C), Min:98.4 F (36.9 C), Max:98.8 F (37.1 C)  Recent Labs  Lab 05/06/23 1030 05/06/23 1150 05/06/23 2147 05/07/23 0517  WBC 11.4*  --   --  6.2  CREATININE  --  0.81  --  0.70  LATICACIDVEN  --   --  0.8  --     Estimated Creatinine Clearance: 83.5 mL/min (by C-G formula based on SCr of 0.7 mg/dL).    Allergies  Allergen Reactions   Asa [Aspirin]     Hx GI bleed    Antimicrobials this admission: 10/17 meropenem >>  Dose adjustments this admission: N/A  Microbiology results: 10/17 BCx: ngtd 10/17 UCx: in process   Thank you for allowing pharmacy to be a part of this patient's care.   Barrie Folk, PharmD Clinical Pharmacist  05/07/2023 7:49 AM

## 2023-05-07 NOTE — Plan of Care (Signed)
  Problem: Clinical Measurements: Goal: Respiratory complications will improve Outcome: Progressing Goal: Cardiovascular complication will be avoided Outcome: Progressing   Problem: Nutrition: Goal: Adequate nutrition will be maintained Outcome: Progressing   Problem: Coping: Goal: Level of anxiety will decrease Outcome: Progressing   Problem: Elimination: Goal: Will not experience complications related to urinary retention Outcome: Progressing   Problem: Pain Managment: Goal: General experience of comfort will improve Outcome: Progressing   Problem: Safety: Goal: Ability to remain free from injury will improve Outcome: Progressing   

## 2023-05-07 NOTE — Progress Notes (Signed)
   05/07/23 1220  Wound / Incision (Open or Dehisced) 05/07/23 Other (Comment) Thigh Posterior;Proximal;Right Moisture associated?  Date First Assessed/Time First Assessed: 05/07/23 1218   Wound Type: Other (Comment)  Location: Thigh  Location Orientation: Posterior;Proximal;Right  Wound Description (Comments): Moisture associated?  Dressing Type Foam - Lift dressing to assess site every shift  Dressing Changed New  Dressing Status Clean, Dry, Intact  Dressing Change Frequency Every 3 days  Site / Wound Assessment Dry;Painful;Red  Peri-wound Assessment Erythema (blanchable);Excoriated  Wound Length (cm) 2 cm  Wound Width (cm) 1 cm  Wound Depth (cm) 0 cm  Wound Volume (cm^3) 0 cm^3  Wound Surface Area (cm^2) 2 cm^2  Margins Attached edges (approximated)  Drainage Amount None  Treatment Cleansed     Wound assessed, Primary team informed.

## 2023-05-07 NOTE — Plan of Care (Signed)
[facility] Tonsillectomy/Adenoidectomy, Postoperative PEDS care plan entered in error.

## 2023-05-07 NOTE — Progress Notes (Addendum)
Progress Note    [name]  MVH:[id number] DOB: Feb 08, 1951  DOA: 05/06/2023 PCP: Koren Bound, NP      Brief Narrative:    Medical records reviewed and are as summarized below:  [name] is a 72 y.o. male with medical history significant of dementia, bipolar, schizophrenia, hypertension, hyperlipidemia, COPD, diastolic CHF, stroke, hypothyroidism, UTI-ESBL, who presented to the hospital with general weakness.reportedly, he was found sitting on the couch covered in urine-soaked blanket.      Assessment/Plan:   Principal Problem:   COVID-19 virus infection Active Problems:   UTI (urinary tract infection)   COPD (chronic obstructive pulmonary disease) (HCC)   Essential hypertension   Hyperlipidemia   Chronic diastolic CHF (congestive heart failure) (HCC)   Hyperthyroidism   Bipolar 1 disorder (HCC)   Schizophrenia (HCC)   BPH (benign prostatic hyperplasia)   Smoker   Protein-calorie malnutrition, severe   Cellulitis of right hip   Pressure injury of skin   Body mass index is 26.95 kg/m.    Acute UTI: Continue IV merropenem.  Follow-up urine and blood cultures.   R hip cellulitis: CT right hip with IV contrast for further evaluation.   COVID-19 infection: No respiratory symptoms or hypoxia.  Continue airborne and contact precautions.   COPD: Compensated.  Continue bronchodilators   Chronic diastolic CHF: Compensated.  2D echo in August 2024 showed EF estimated at 65%, grade 1 diastolic dysfunction.   Other comorbidities include bipolar disorder, schizophrenia, hyperthyroidism, BPH, hyperlipidemia, hypertension     Diet Order             DIET DYS 3 Fluid consistency: Thin  Diet effective now                            Consultants: None  Procedures: None    Medications:    vitamin C  500 mg Oral BID   cholecalciferol  1,000 Units Oral Daily   citalopram  40 mg Oral Daily   enoxaparin (LOVENOX) injection  40  mg Subcutaneous Q24H   feeding supplement  237 mL Oral TID BM   methimazole  5 mg Oral TID   multivitamin with minerals  1 tablet Oral Daily   nicotine  21 mg Transdermal Daily   risperiDONE  1 mg Oral Daily   simvastatin  20 mg Oral Daily   tamsulosin  0.4 mg Oral QPC supper   zinc sulfate  220 mg Oral Daily   Continuous Infusions:  meropenem (MERREM) IV 1 g (05/07/23 0514)     Anti-infectives (From admission, onward)    Start     Dose/Rate Route Frequency Ordered Stop   05/07/23 2000  cefTRIAXone (ROCEPHIN) 1 g in sodium chloride 0.9 % 100 mL IVPB  Status:  Discontinued        1 g 200 mL/hr over 30 Minutes Intravenous Every 24 hours 05/06/23 2125 05/06/23 2125   05/06/23 2145  meropenem (MERREM) 1 g in sodium chloride 0.9 % 100 mL IVPB        1 g 200 mL/hr over 30 Minutes Intravenous Every 8 hours 05/06/23 2137     05/06/23 2045  cefTRIAXone (ROCEPHIN) 1 g in sodium chloride 0.9 % 100 mL IVPB        1 g 200 mL/hr over 30 Minutes Intravenous  Once 05/06/23 2034 05/06/23 2127              Family Communication/Anticipated D/C date  and plan/Code Status   DVT prophylaxis: enoxaparin (LOVENOX) injection 40 mg Start: 05/06/23 2200     Code Status: Full Code  Family Communication: None Disposition Plan: Plan to discharge to group home   Status is: Observation The patient will require care spanning > 2 midnights and should be moved to inpatient because: Acute UTI on IV antibiotics, right hip cellulitis       Subjective:   Interval events noted.  He complains of right hip pain.  Objective:    Vitals:   05/06/23 2200 05/06/23 2230 05/06/23 2329 05/07/23 1550  BP: (!) 140/78 125/68 (!) 144/66 131/64  Pulse: (!) 105 100 (!) 107 100  Resp: 13 13 20 14   Temp:   98.8 F (37.1 C) 98.7 F (37.1 C)  TempSrc:      SpO2: 95% 96% 97% 96%  Weight:      Height:       No data found.   Intake/Output Summary (Last 24 hours) at 05/07/2023 1557 Last data filed at  05/07/2023 0300 Gross per 24 hour  Intake 100 ml  Output --  Net 100 ml   Filed Weights   05/06/23 1055  Weight: 82.8 kg    Exam:  GEN: NAD SKIN: Warm and dry.  Stage II right buttock decubitus ulcer.  Superficial wound on the right hip EYES: No pallor or icterus ENT: MMM CV: RRR PULM: CTA B ABD: soft, ND, NT, +BS CNS: AAO x 2 (person and place), non focal EXT: Tenderness, erythema and mild swelling on the right hip.          Pressure Injury 03/23/23 Buttocks Left;Right Stage 2 -  Partial thickness loss of dermis presenting as a shallow open injury with a red, pink wound bed without slough. Shallow open wound on buttock (Active)  03/23/23 1124  Location: Buttocks  Location Orientation: Left;Right  Staging: Stage 2 -  Partial thickness loss of dermis presenting as a shallow open injury with a red, pink wound bed without slough.  Wound Description (Comments): Shallow open wound on buttock  Present on Admission:      Pressure Injury 05/06/23 Coccyx Mid Stage 2 -  Partial thickness loss of dermis presenting as a shallow open injury with a red, pink wound bed without slough. (Active)  05/06/23 2330  Location: Coccyx  Location Orientation: Mid  Staging: Stage 2 -  Partial thickness loss of dermis presenting as a shallow open injury with a red, pink wound bed without slough.  Wound Description (Comments):   Present on Admission: Yes  Dressing Type Foam - Lift dressing to assess site every shift 05/07/23 1100     Data Reviewed:   I have personally reviewed following labs and imaging studies:  Labs: Labs show the following:   Basic Metabolic Panel: Recent Labs  Lab 05/06/23 1150 05/07/23 0517  NA 138 137  K 3.9 3.5  CL 104 107  CO2 25 24  GLUCOSE 83 68*  BUN 20 15  CREATININE 0.81 0.70  CALCIUM 9.1 8.2*   GFR Estimated Creatinine Clearance: 83.5 mL/min (by C-G formula based on SCr of 0.7 mg/dL). Liver Function Tests: Recent Labs  Lab 05/06/23 1150   AST 20  ALT 13  ALKPHOS 73  BILITOT 1.8*  PROT 7.4  ALBUMIN 2.7*   No results for input(s): "LIPASE", "AMYLASE" in the last 168 hours. No results for input(s): "AMMONIA" in the last 168 hours. Coagulation profile No results for input(s): "INR", "PROTIME" in the last 168 hours.  CBC: Recent Labs  Lab 05/06/23 1030 05/07/23 0517  WBC 11.4* 6.2  NEUTROABS 9.3*  --   HGB 11.9* 8.9*  HCT 38.7* 28.0*  MCV 92.6 90.6  PLT 219 169   Cardiac Enzymes: No results for input(s): "CKTOTAL", "CKMB", "CKMBINDEX", "TROPONINI" in the last 168 hours. BNP (last 3 results) No results for input(s): "PROBNP" in the last 8760 hours. CBG: No results for input(s): "GLUCAP" in the last 168 hours. D-Dimer: No results for input(s): "DDIMER" in the last 72 hours. Hgb A1c: No results for input(s): "HGBA1C" in the last 72 hours. Lipid Profile: No results for input(s): "CHOL", "HDL", "LDLCALC", "TRIG", "CHOLHDL", "LDLDIRECT" in the last 72 hours. Thyroid function studies: No results for input(s): "TSH", "T4TOTAL", "T3FREE", "THYROIDAB" in the last 72 hours.  Invalid input(s): "FREET3" Anemia work up: No results for input(s): "VITAMINB12", "FOLATE", "FERRITIN", "TIBC", "IRON", "RETICCTPCT" in the last 72 hours. Sepsis Labs: Recent Labs  Lab 05/06/23 1030 05/06/23 2147 05/07/23 0517  PROCALCITON  --   --  1.58  WBC 11.4*  --  6.2  LATICACIDVEN  --  0.8  --     Microbiology Recent Results (from the past 240 hour(s))  Resp panel by RT-PCR (RSV, Flu A&B, Covid) Anterior Nasal Swab     Status: Abnormal   Collection Time: 05/06/23  8:12 PM   Specimen: Anterior Nasal Swab  Result Value Ref Range Status   SARS Coronavirus 2 by RT PCR POSITIVE (A) NEGATIVE Final    Comment: (NOTE) SARS-CoV-2 target nucleic acids are DETECTED.  The SARS-CoV-2 RNA is generally detectable in upper respiratory specimens during the acute phase of infection. Positive results are indicative of the presence of the  identified virus, but do not rule out bacterial infection or co-infection with other pathogens not detected by the test. Clinical correlation with patient history and other diagnostic information is necessary to determine patient infection status. The expected result is Negative.  Fact Sheet for Patients: BloggerCourse.com  Fact Sheet for Healthcare Providers: SeriousBroker.it  This test is not yet approved or cleared by the [place] FDA and  has been authorized for detection and/or diagnosis of SARS-CoV-2 by FDA under an Emergency Use Authorization (EUA).  This EUA will remain in effect (meaning this test can be used) for the duration of  the COVID-19 declaration under Section 564(b)(1) of the A ct, 21 U.S.C. section 360bbb-3(b)(1), unless the authorization is terminated or revoked sooner.     Influenza A by PCR NEGATIVE NEGATIVE Final   Influenza B by PCR NEGATIVE NEGATIVE Final    Comment: (NOTE) The Xpert Xpress SARS-CoV-2/FLU/RSV plus assay is intended as an aid in the diagnosis of influenza from Nasopharyngeal swab specimens and should not be used as a sole basis for treatment. Nasal washings and aspirates are unacceptable for Xpert Xpress SARS-CoV-2/FLU/RSV testing.  Fact Sheet for Patients: BloggerCourse.com  Fact Sheet for Healthcare Providers: SeriousBroker.it  This test is not yet approved or cleared by the [place] FDA and has been authorized for detection and/or diagnosis of SARS-CoV-2 by FDA under an Emergency Use Authorization (EUA). This EUA will remain in effect (meaning this test can be used) for the duration of the COVID-19 declaration under Section 564(b)(1) of the Act, 21 U.S.C. section 360bbb-3(b)(1), unless the authorization is terminated or revoked.     Resp Syncytial Virus by PCR NEGATIVE NEGATIVE Final    Comment: (NOTE) Fact Sheet for  Patients: BloggerCourse.com  Fact Sheet for Healthcare Providers: SeriousBroker.it  This test is not  yet approved or cleared by the [place] and has been authorized for detection and/or diagnosis of SARS-CoV-2 by FDA under an Emergency Use Authorization (EUA). This EUA will remain in effect (meaning this test can be used) for the duration of the COVID-19 declaration under Section 564(b)(1) of the Act, 21 U.S.C. section 360bbb-3(b)(1), unless the authorization is terminated or revoked.  Performed at [facility], [address] [place], [place] [address]   Culture, blood (x 2)     Status: None (Preliminary result)   Collection Time: 05/06/23  9:47 PM   Specimen: BLOOD  Result Value Ref Range Status   Specimen Description BLOOD BLOOD RIGHT ARM  Final   Special Requests   Final    BOTTLES DRAWN AEROBIC AND ANAEROBIC Blood Culture adequate volume   Culture   Final    NO GROWTH < 12 HOURS Performed at [facility], [address] [place], [place] [address]    Report Status PENDING  Incomplete  Culture, blood (x 2)     Status: None (Preliminary result)   Collection Time: 05/06/23  9:47 PM   Specimen: BLOOD  Result Value Ref Range Status   Specimen Description BLOOD BLOOD LEFT ARM  Final   Special Requests   Final    BOTTLES DRAWN AEROBIC AND ANAEROBIC Blood Culture adequate volume   Culture   Final    NO GROWTH < 12 HOURS Performed at [facility], [address], [place], [place] [address]    Report Status PENDING  Incomplete    Procedures and diagnostic studies:  DG Chest Port 1 View  Result Date: 05/07/2023 CLINICAL DATA:  COVID-19 viral infection. EXAM: PORTABLE CHEST 1 VIEW COMPARISON:  AP Lat chest 03/04/2023 FINDINGS: There is mild cardiomegaly.  Central vessels are normal caliber. The aorta is tortuous with heavy calcification. Stable mediastinum. The lungs are mildly  emphysematous. There is hazy opacity in the right lung base, which could be asymmetric chest wall attenuation or pneumonitis. The remaining lungs are radiographically clear. There is thoracic spondylosis. Follow-up PA and lateral views recommended. IMPRESSION: 1. Hazy opacity in the right lung base, which could be asymmetric chest wall attenuation or pneumonitis. Follow-up PA and lateral views recommended. 2. Mild cardiomegaly. 3. Aortic atherosclerosis. Electronically Signed   By: Almira Bar M.D.   On: 05/07/2023 03:50   CT Head Wo Contrast  Result Date: 05/06/2023 CLINICAL DATA:  Mental status change, unknown cause EXAM: CT HEAD WITHOUT CONTRAST TECHNIQUE: Contiguous axial images were obtained from the base of the skull through the vertex without intravenous contrast. RADIATION DOSE REDUCTION: This exam was performed according to the departmental dose-optimization program which includes automated exposure control, adjustment of the mA and/or kV according to patient size and/or use of iterative reconstruction technique. COMPARISON:  MRI head 03/05/2023.  CT head 03/04/2023. FINDINGS: Brain: No evidence of acute infarction, hemorrhage, hydrocephalus, extra-axial collection or mass lesion/mass effect. Similar chronic microvascular ischemic disease and remote infarcts in the right basal ganglia, pons, and white matter. Vascular: Calcific atherosclerosis. No hyperdense vessel identified. Skull: No acute fracture. Sinuses/Orbits: Mild paranasal sinus mucosal thickening. No acute orbital findings. Other: No mastoid effusions. IMPRESSION: 1. No evidence of acute intracranial abnormality. 2. Chronic microvascular ischemic disease. Electronically Signed   By: Feliberto Harts M.D.   On: 05/06/2023 12:48               LOS: 0 days   Jelissa Espiritu  [facility]   Pager on [email or web address].  If 7PM-7AM, please contact night-coverage at [email or web address]     05/07/2023, 3:57 PM

## 2023-05-08 DIAGNOSIS — E785 Hyperlipidemia, unspecified: Secondary | ICD-10-CM | POA: Diagnosis present

## 2023-05-08 DIAGNOSIS — I5032 Chronic diastolic (congestive) heart failure: Secondary | ICD-10-CM | POA: Diagnosis present

## 2023-05-08 DIAGNOSIS — L03115 Cellulitis of right lower limb: Secondary | ICD-10-CM | POA: Diagnosis present

## 2023-05-08 DIAGNOSIS — S72001A Fracture of unspecified part of neck of right femur, initial encounter for closed fracture: Secondary | ICD-10-CM | POA: Diagnosis not present

## 2023-05-08 DIAGNOSIS — F200 Paranoid schizophrenia: Secondary | ICD-10-CM | POA: Diagnosis present

## 2023-05-08 DIAGNOSIS — I110 Hypertensive heart disease with heart failure: Secondary | ICD-10-CM | POA: Diagnosis present

## 2023-05-08 DIAGNOSIS — L89313 Pressure ulcer of right buttock, stage 3: Secondary | ICD-10-CM | POA: Diagnosis present

## 2023-05-08 DIAGNOSIS — I10 Essential (primary) hypertension: Secondary | ICD-10-CM | POA: Diagnosis not present

## 2023-05-08 DIAGNOSIS — N400 Enlarged prostate without lower urinary tract symptoms: Secondary | ICD-10-CM | POA: Diagnosis present

## 2023-05-08 DIAGNOSIS — E43 Unspecified severe protein-calorie malnutrition: Secondary | ICD-10-CM | POA: Diagnosis present

## 2023-05-08 DIAGNOSIS — S72141A Displaced intertrochanteric fracture of right femur, initial encounter for closed fracture: Secondary | ICD-10-CM | POA: Diagnosis present

## 2023-05-08 DIAGNOSIS — U071 COVID-19: Secondary | ICD-10-CM | POA: Diagnosis present

## 2023-05-08 DIAGNOSIS — F17210 Nicotine dependence, cigarettes, uncomplicated: Secondary | ICD-10-CM | POA: Diagnosis present

## 2023-05-08 DIAGNOSIS — Z6825 Body mass index (BMI) 25.0-25.9, adult: Secondary | ICD-10-CM | POA: Diagnosis not present

## 2023-05-08 DIAGNOSIS — J449 Chronic obstructive pulmonary disease, unspecified: Secondary | ICD-10-CM | POA: Diagnosis present

## 2023-05-08 DIAGNOSIS — E0590 Thyrotoxicosis, unspecified without thyrotoxic crisis or storm: Secondary | ICD-10-CM | POA: Diagnosis present

## 2023-05-08 DIAGNOSIS — R531 Weakness: Secondary | ICD-10-CM | POA: Diagnosis present

## 2023-05-08 DIAGNOSIS — E039 Hypothyroidism, unspecified: Secondary | ICD-10-CM | POA: Diagnosis present

## 2023-05-08 DIAGNOSIS — X58XXXA Exposure to other specified factors, initial encounter: Secondary | ICD-10-CM | POA: Diagnosis present

## 2023-05-08 DIAGNOSIS — F319 Bipolar disorder, unspecified: Secondary | ICD-10-CM | POA: Diagnosis present

## 2023-05-08 DIAGNOSIS — F03A3 Unspecified dementia, mild, with mood disturbance: Secondary | ICD-10-CM | POA: Diagnosis present

## 2023-05-08 DIAGNOSIS — L24A0 Irritant contact dermatitis due to friction or contact with body fluids, unspecified: Secondary | ICD-10-CM | POA: Diagnosis present

## 2023-05-08 DIAGNOSIS — Z79899 Other long term (current) drug therapy: Secondary | ICD-10-CM | POA: Diagnosis not present

## 2023-05-08 LAB — CBC WITH DIFFERENTIAL/PLATELET
Abs Immature Granulocytes: 0.03 10*3/uL (ref 0.00–0.07)
Basophils Absolute: 0 10*3/uL (ref 0.0–0.1)
Basophils Relative: 0 %
Eosinophils Absolute: 0 10*3/uL (ref 0.0–0.5)
Eosinophils Relative: 0 %
HCT: 26.6 % — ABNORMAL LOW (ref 39.0–52.0)
Hemoglobin: 8.7 g/dL — ABNORMAL LOW (ref 13.0–17.0)
Immature Granulocytes: 0 %
Lymphocytes Relative: 7 %
Lymphs Abs: 0.5 10*3/uL — ABNORMAL LOW (ref 0.7–4.0)
MCH: 29.4 pg (ref 26.0–34.0)
MCHC: 32.7 g/dL (ref 30.0–36.0)
MCV: 89.9 fL (ref 80.0–100.0)
Monocytes Absolute: 0.4 10*3/uL (ref 0.1–1.0)
Monocytes Relative: 5 %
Neutro Abs: 6.2 10*3/uL (ref 1.7–7.7)
Neutrophils Relative %: 88 %
Platelets: 175 10*3/uL (ref 150–400)
RBC: 2.96 MIL/uL — ABNORMAL LOW (ref 4.22–5.81)
RDW: 13.8 % (ref 11.5–15.5)
WBC: 7.2 10*3/uL (ref 4.0–10.5)
nRBC: 0 % (ref 0.0–0.2)

## 2023-05-08 MED ORDER — GERHARDT'S BUTT CREAM
TOPICAL_CREAM | Freq: Three times a day (TID) | CUTANEOUS | Status: DC
  Administered 2023-05-16 – 2023-05-17 (×3): 1 via TOPICAL
  Filled 2023-05-08: qty 1

## 2023-05-08 MED ORDER — MEDIHONEY WOUND/BURN DRESSING EX PSTE
1.0000 | PASTE | Freq: Every day | CUTANEOUS | Status: DC
  Administered 2023-05-08 – 2023-05-24 (×16): 1 via TOPICAL
  Filled 2023-05-08 (×4): qty 44

## 2023-05-08 MED ORDER — SODIUM CHLORIDE 0.9% FLUSH
10.0000 mL | INTRAVENOUS | Status: DC | PRN
Start: 2023-05-08 — End: 2023-05-25

## 2023-05-08 MED ORDER — SODIUM CHLORIDE 0.9% FLUSH
10.0000 mL | Freq: Two times a day (BID) | INTRAVENOUS | Status: DC
  Administered 2023-05-08 – 2023-05-11 (×7): 10 mL
  Administered 2023-05-12: 20 mL
  Administered 2023-05-12 – 2023-05-25 (×25): 10 mL

## 2023-05-08 MED ORDER — SODIUM CHLORIDE 0.9 % IV SOLN
1.0000 g | INTRAVENOUS | Status: DC
Start: 2023-05-08 — End: 2023-05-15
  Administered 2023-05-08 – 2023-05-12 (×5): 1 g via INTRAVENOUS
  Filled 2023-05-08 (×6): qty 10

## 2023-05-08 NOTE — Care Management Obs Status (Signed)
MEDICARE OBSERVATION STATUS NOTIFICATION   Patient Details  Name: [name] MRN: [id number] Date of Birth: 07-15-1951   Medicare Observation Status Notification Given:  Yes    Randall Colden E Cele Mote, LCSW 05/08/2023, 12:06 PM

## 2023-05-08 NOTE — Plan of Care (Signed)
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
## 2023-05-08 NOTE — Plan of Care (Signed)
Problem: Education: Goal: Knowledge of General Education information will improve Description: Including pain rating scale, medication(s)/side effects and non-pharmacologic comfort measures Outcome: Progressing   Problem: Nutrition: Goal: Adequate nutrition will be maintained Outcome: Progressing   Problem: Coping: Goal: Level of anxiety will decrease Outcome: Progressing   Problem: Elimination: Goal: Will not experience complications related to bowel motility Outcome: Progressing Goal: Will not experience complications related to urinary retention Outcome: Progressing   Problem: Safety: Goal: Ability to remain free from injury will improve Outcome: Progressing

## 2023-05-08 NOTE — Progress Notes (Signed)
OT Cancellation Note  Patient Details Name: Ante Cansler MRN: [id number] DOB: [birth year]   Cancelled Treatment:    Reason Eval/Treat Not Completed: Patient not medically ready (OT consult received, chart reviewed. CT of the right lower extremity revealed comminuted intertrochanteric fracture of the proximal right femur. Ortho consult still pending. Will hold this date and re-attempt evaluation once pt is cleared for activity and WBing status has been determined.)  Gerrie Nordmann 05/08/2023, 2:36 PM

## 2023-05-08 NOTE — Progress Notes (Addendum)
Progress Note    [name]  YQM:[id number] DOB: 1951/03/17  DOA: 05/06/2023 PCP: Koren Bound, NP      Brief Narrative:    Medical records reviewed and are as summarized below:  [name] is a 72 y.o. male with medical history significant of dementia, bipolar, schizophrenia, hypertension, hyperlipidemia, COPD, diastolic CHF, stroke, hypothyroidism, UTI-ESBL, who presented to the hospital with general weakness.reportedly, he was found sitting on the couch covered in urine-soaked blanket.      Assessment/Plan:   Principal Problem:   Closed right hip fracture (HCC) Active Problems:   COVID-19 virus infection   COPD (chronic obstructive pulmonary disease) (HCC)   Cellulitis of right hip   Essential hypertension   Hyperlipidemia   Chronic diastolic CHF (congestive heart failure) (HCC)   Hyperthyroidism   Bipolar 1 disorder (HCC)   Schizophrenia (HCC)   BPH (benign prostatic hyperplasia)   Smoker   Protein-calorie malnutrition, severe   Pressure injury of skin   Body mass index is 26.95 kg/m.   Comminuted right hip fracture (intertrochanteric proximal right femur): Patient has been evaluated by Dr. Hyacinth Meeker, orthopedic surgeon.  He wants to delay surgery until patient has had adequate antibiotics for right hip cellulitis.  Continue Lovenox for DVT prophylaxis   Right hip cellulitis: Discontinue IV meropenem.  Start IV ceftriaxone.  Analgesics as needed for pain.   UTI has been ruled out.  No growth on urine culture.  IV meropenem has been discontinued.    COVID-19 infection: No respiratory symptoms or hypoxia.  Continue airborne and contact precautions.   COPD: Compensated.  Continue bronchodilators   Chronic diastolic CHF: Compensated.  2D echo in August 2024 showed EF estimated at 65%, grade 1 diastolic dysfunction.   Other comorbidities include bipolar disorder, schizophrenia, hyperthyroidism, BPH, hyperlipidemia, hypertension     Diet  Order             DIET DYS 3 Fluid consistency: Thin  Diet effective now                            Consultants: None  Procedures: None    Medications:    vitamin C  500 mg Oral BID   cholecalciferol  1,000 Units Oral Daily   citalopram  40 mg Oral Daily   enoxaparin (LOVENOX) injection  40 mg Subcutaneous Q24H   feeding supplement  237 mL Oral TID BM   methimazole  5 mg Oral TID   multivitamin with minerals  1 tablet Oral Daily   nicotine  21 mg Transdermal Daily   risperiDONE  1 mg Oral Daily   simvastatin  20 mg Oral Daily   tamsulosin  0.4 mg Oral QPC supper   zinc sulfate  220 mg Oral Daily   Continuous Infusions:  cefTRIAXone (ROCEPHIN)  IV       Anti-infectives (From admission, onward)    Start     Dose/Rate Route Frequency Ordered Stop   05/08/23 1600  cefTRIAXone (ROCEPHIN) 1 g in sodium chloride 0.9 % 100 mL IVPB        1 g 200 mL/hr over 30 Minutes Intravenous Every 24 hours 05/08/23 1510     05/07/23 2000  cefTRIAXone (ROCEPHIN) 1 g in sodium chloride 0.9 % 100 mL IVPB  Status:  Discontinued        1 g 200 mL/hr over 30 Minutes Intravenous Every 24 hours 05/06/23 2125 05/06/23 2125   05/06/23 2145  meropenem (MERREM) 1 g in sodium chloride 0.9 % 100 mL IVPB  Status:  Discontinued        1 g 200 mL/hr over 30 Minutes Intravenous Every 8 hours 05/06/23 2137 05/08/23 0803   05/06/23 2045  cefTRIAXone (ROCEPHIN) 1 g in sodium chloride 0.9 % 100 mL IVPB        1 g 200 mL/hr over 30 Minutes Intravenous  Once 05/06/23 2034 05/06/23 2127              Family Communication/Anticipated D/C date and plan/Code Status   DVT prophylaxis: enoxaparin (LOVENOX) injection 40 mg Start: 05/06/23 2200     Code Status: Full Code  Family Communication: None Disposition Plan: Plan to discharge to group home   Status is: Observation The patient will require care spanning > 2 midnights and should be moved to inpatient because: Right hip  fracture, right hip cellulitis       Subjective:   Interval event events noted.  He does not report any complaints.  He admits to having right hip pain.  Objective:    Vitals:   05/06/23 2329 05/07/23 1550 05/08/23 0120 05/08/23 0757  BP: (!) 144/66 131/64 112/71 (!) 143/77  Pulse: (!) 107 100 (!) 101 (!) 105  Resp: 20 14 18 15   Temp: 98.8 F (37.1 C) 98.7 F (37.1 C) 98.7 F (37.1 C) 99.1 F (37.3 C)  TempSrc:   Oral Oral  SpO2: 97% 96% 99% 96%  Weight:      Height:       No data found.   Intake/Output Summary (Last 24 hours) at 05/08/2023 1538 Last data filed at 05/08/2023 0805 Gross per 24 hour  Intake 300 ml  Output 500 ml  Net -200 ml   Filed Weights   05/06/23 1055  Weight: 82.8 kg    Exam:  GEN: NAD SKIN: Warm and dry EYES: No pallor or icterus ENT: MMM CV: RRR PULM: CTA B ABD: soft, ND, NT, +BS CNS: AAO x 2 (person and place), non focal EXT: Tenderness, mild swelling and erythema of the right hip.  Superficial ulcer on the right hip       Data Reviewed:   I have personally reviewed following labs and imaging studies:  Labs: Labs show the following:   Basic Metabolic Panel: Recent Labs  Lab 05/06/23 1150 05/07/23 0517  NA 138 137  K 3.9 3.5  CL 104 107  CO2 25 24  GLUCOSE 83 68*  BUN 20 15  CREATININE 0.81 0.70  CALCIUM 9.1 8.2*   GFR Estimated Creatinine Clearance: 83.5 mL/min (by C-G formula based on SCr of 0.7 mg/dL). Liver Function Tests: Recent Labs  Lab 05/06/23 1150  AST 20  ALT 13  ALKPHOS 73  BILITOT 1.8*  PROT 7.4  ALBUMIN 2.7*   No results for input(s): "LIPASE", "AMYLASE" in the last 168 hours. No results for input(s): "AMMONIA" in the last 168 hours. Coagulation profile Recent Labs  Lab 05/07/23 2306  INR 1.3*    CBC: Recent Labs  Lab 05/06/23 1030 05/07/23 0517 05/08/23 0427  WBC 11.4* 6.2 7.2  NEUTROABS 9.3*  --  6.2  HGB 11.9* 8.9* 8.7*  HCT 38.7* 28.0* 26.6*  MCV 92.6 90.6 89.9   PLT 219 169 175   Cardiac Enzymes: No results for input(s): "CKTOTAL", "CKMB", "CKMBINDEX", "TROPONINI" in the last 168 hours. BNP (last 3 results) No results for input(s): "PROBNP" in the last 8760 hours. CBG: No results for input(s): "GLUCAP"  in the last 168 hours. D-Dimer: No results for input(s): "DDIMER" in the last 72 hours. Hgb A1c: No results for input(s): "HGBA1C" in the last 72 hours. Lipid Profile: No results for input(s): "CHOL", "HDL", "LDLCALC", "TRIG", "CHOLHDL", "LDLDIRECT" in the last 72 hours. Thyroid function studies: No results for input(s): "TSH", "T4TOTAL", "T3FREE", "THYROIDAB" in the last 72 hours.  Invalid input(s): "FREET3" Anemia work up: No results for input(s): "VITAMINB12", "FOLATE", "FERRITIN", "TIBC", "IRON", "RETICCTPCT" in the last 72 hours. Sepsis Labs: Recent Labs  Lab 05/06/23 1030 05/06/23 2147 05/07/23 0517 05/08/23 0427  PROCALCITON  --   --  1.58  --   WBC 11.4*  --  6.2 7.2  LATICACIDVEN  --  0.8  --   --     Microbiology Recent Results (from the past 240 hour(s))  Urine Culture     Status: None   Collection Time: 05/06/23  6:58 PM   Specimen: Urine, Random  Result Value Ref Range Status   Specimen Description   Final    URINE, RANDOM Performed at [facility], [address] [place], [place] [address]    Special Requests   Final    NONE Reflexed from [id number] Performed at [facility], [address], [place], [place] [address]    Culture   Final    NO GROWTH Performed at [facility], [address] [place], [place] [address]    Report Status 05/07/2023 FINAL  Final  Resp panel by RT-PCR (RSV, Flu A&B, Covid) Anterior Nasal Swab     Status: Abnormal   Collection Time: 05/06/23  8:12 PM   Specimen: Anterior Nasal Swab  Result Value Ref Range Status   SARS Coronavirus 2 by RT PCR POSITIVE (A) NEGATIVE Final    Comment: (NOTE) SARS-CoV-2 target nucleic acids are DETECTED.  The SARS-CoV-2  RNA is generally detectable in upper respiratory specimens during the acute phase of infection. Positive results are indicative of the presence of the identified virus, but do not rule out bacterial infection or co-infection with other pathogens not detected by the test. Clinical correlation with patient history and other diagnostic information is necessary to determine patient infection status. The expected result is Negative.  Fact Sheet for Patients: BloggerCourse.com  Fact Sheet for Healthcare Providers: SeriousBroker.it  This test is not yet approved or cleared by the [place] FDA and  has been authorized for detection and/or diagnosis of SARS-CoV-2 by FDA under an Emergency Use Authorization (EUA).  This EUA will remain in effect (meaning this test can be used) for the duration of  the COVID-19 declaration under Section 564(b)(1) of the A ct, 21 U.S.C. section 360bbb-3(b)(1), unless the authorization is terminated or revoked sooner.     Influenza A by PCR NEGATIVE NEGATIVE Final   Influenza B by PCR NEGATIVE NEGATIVE Final    Comment: (NOTE) The Xpert Xpress SARS-CoV-2/FLU/RSV plus assay is intended as an aid in the diagnosis of influenza from Nasopharyngeal swab specimens and should not be used as a sole basis for treatment. Nasal washings and aspirates are unacceptable for Xpert Xpress SARS-CoV-2/FLU/RSV testing.  Fact Sheet for Patients: BloggerCourse.com  Fact Sheet for Healthcare Providers: SeriousBroker.it  This test is not yet approved or cleared by the [place] FDA and has been authorized for detection and/or diagnosis of SARS-CoV-2 by FDA under an Emergency Use Authorization (EUA). This EUA will remain in effect (meaning this test can be used) for the duration of the COVID-19 declaration under Section  564(b)(1) of the Act, 21 U.S.C. section  360bbb-3(b)(1), unless the authorization is terminated or revoked.     Resp Syncytial Virus by PCR NEGATIVE NEGATIVE Final    Comment: (NOTE) Fact Sheet for Patients: BloggerCourse.com  Fact Sheet for Healthcare Providers: SeriousBroker.it  This test is not yet approved or cleared by the [place] FDA and has been authorized for detection and/or diagnosis of SARS-CoV-2 by FDA under an Emergency Use Authorization (EUA). This EUA will remain in effect (meaning this test can be used) for the duration of the COVID-19 declaration under Section 564(b)(1) of the Act, 21 U.S.C. section 360bbb-3(b)(1), unless the authorization is terminated or revoked.  Performed at [facility], [address] [place], [place] [address]   Culture, blood (x 2)     Status: None (Preliminary result)   Collection Time: 05/06/23  9:47 PM   Specimen: BLOOD  Result Value Ref Range Status   Specimen Description BLOOD BLOOD RIGHT ARM  Final   Special Requests   Final    BOTTLES DRAWN AEROBIC AND ANAEROBIC Blood Culture adequate volume   Culture   Final    NO GROWTH 2 DAYS Performed at [facility], [address], [place], [place] [address]    Report Status PENDING  Incomplete  Culture, blood (x 2)     Status: None (Preliminary result)   Collection Time: 05/06/23  9:47 PM   Specimen: BLOOD  Result Value Ref Range Status   Specimen Description BLOOD BLOOD LEFT ARM  Final   Special Requests   Final    BOTTLES DRAWN AEROBIC AND ANAEROBIC Blood Culture adequate volume   Culture   Final    NO GROWTH 2 DAYS Performed at [facility], [address], [place], [place] [address]    Report Status PENDING  Incomplete    Procedures and diagnostic studies:  CT HIP RIGHT W CONTRAST  Result Date: 05/07/2023 CLINICAL DATA:  Osteomyelitis, hip Soft tissue infection suspected, hip, no prior imaging EXAM: CT OF THE LOWER RIGHT  EXTREMITY WITH CONTRAST TECHNIQUE: Multidetector CT imaging of the lower right extremity was performed according to the standard protocol following intravenous contrast administration. RADIATION DOSE REDUCTION: This exam was performed according to the departmental dose-optimization program which includes automated exposure control, adjustment of the mA and/or kV according to patient size and/or use of iterative reconstruction technique. CONTRAST:  OMNIPAQUE IOHEXOL 300 MG/ML  SOLN COMPARISON:  CT 03/18/2023 FINDINGS: Bones/Joint/Cartilage Acute, comminuted intertrochanteric fracture of the proximal right femur with mild displacement and varus angulation. Hip joint alignment is maintained without dislocation. Mild right hip joint space narrowing. No additional fractures. No lytic or sclerotic bone lesion. Ligaments Suboptimally assessed by CT. Muscles and Tendons No acute musculotendinous abnormality by CT. Soft tissues Soft tissue swelling about the right hip. No organized fluid collection or hematoma. No right inguinal lymphadenopathy. Atherosclerotic vascular calcifications. IMPRESSION: 1. Acute, comminuted intertrochanteric fracture of the proximal right femur with mild displacement and varus angulation. 2. Soft tissue swelling about the right hip. No organized fluid collection or hematoma. These results will be called to the ordering clinician or representative by the Radiologist Assistant, and communication documented in the PACS or [employer]. Electronically Signed   By: Duanne Guess D.O.   On: 05/07/2023 21:18   DG Chest Port 1 View  Result Date: 05/07/2023 CLINICAL DATA:  COVID-19 viral infection. EXAM: PORTABLE CHEST 1 VIEW COMPARISON:  AP Lat chest 03/04/2023 FINDINGS: There is mild cardiomegaly.  Central vessels  are normal caliber. The aorta is tortuous with heavy calcification. Stable mediastinum. The lungs are mildly emphysematous. There is hazy opacity in the right lung base, which  could be asymmetric chest wall attenuation or pneumonitis. The remaining lungs are radiographically clear. There is thoracic spondylosis. Follow-up PA and lateral views recommended. IMPRESSION: 1. Hazy opacity in the right lung base, which could be asymmetric chest wall attenuation or pneumonitis. Follow-up PA and lateral views recommended. 2. Mild cardiomegaly. 3. Aortic atherosclerosis. Electronically Signed   By: Almira Bar M.D.   On: 05/07/2023 03:50               LOS: 0 days   Ryane Canavan  [facility]   Pager on [email or web address]. If 7PM-7AM, please contact night-coverage at [email or web address]     05/08/2023, 3:38 PM

## 2023-05-08 NOTE — Plan of Care (Signed)
  Problem: Education: Goal: Knowledge of General Education information will improve Description: Including pain rating scale, medication(s)/side effects and non-pharmacologic comfort measures 05/08/2023 0233 by Dorthula Nettles, RN Outcome: Progressing 05/08/2023 0227 by Dorthula Nettles, RN Outcome: Progressing   Problem: Health Behavior/Discharge Planning: Goal: Ability to manage health-related needs will improve 05/08/2023 0233 by Dorthula Nettles, RN Outcome: Progressing 05/08/2023 0227 by Dorthula Nettles, RN Outcome: Progressing   Problem: Clinical Measurements: Goal: Ability to maintain clinical measurements within normal limits will improve 05/08/2023 0233 by Dorthula Nettles, RN Outcome: Progressing 05/08/2023 0227 by Dorthula Nettles, RN Outcome: Progressing Goal: Will remain free from infection 05/08/2023 0233 by Dorthula Nettles, RN Outcome: Progressing 05/08/2023 0227 by Dorthula Nettles, RN Outcome: Progressing Goal: Diagnostic test results will improve 05/08/2023 0233 by Dorthula Nettles, RN Outcome: Progressing 05/08/2023 0227 by Dorthula Nettles, RN Outcome: Progressing Goal: Respiratory complications will improve 05/08/2023 0233 by Dorthula Nettles, RN Outcome: Progressing 05/08/2023 0227 by Dorthula Nettles, RN Outcome: Progressing Goal: Cardiovascular complication will be avoided 05/08/2023 0233 by Dorthula Nettles, RN Outcome: Progressing 05/08/2023 0227 by Dorthula Nettles, RN Outcome: Progressing   Problem: Activity: Goal: Risk for activity intolerance will decrease 05/08/2023 0233 by Dorthula Nettles, RN Outcome: Progressing 05/08/2023 0227 by Dorthula Nettles, RN Outcome: Progressing   Problem: Nutrition: Goal: Adequate nutrition will be maintained 05/08/2023 0233 by Dorthula Nettles, RN Outcome: Progressing 05/08/2023 0227 by Dorthula Nettles, RN Outcome: Progressing   Problem: Coping: Goal: Level of anxiety will decrease 05/08/2023 0233 by Dorthula Nettles, RN Outcome:  Progressing 05/08/2023 0227 by Dorthula Nettles, RN Outcome: Progressing   Problem: Elimination: Goal: Will not experience complications related to bowel motility 05/08/2023 0233 by Dorthula Nettles, RN Outcome: Progressing 05/08/2023 0227 by Dorthula Nettles, RN Outcome: Progressing Goal: Will not experience complications related to urinary retention 05/08/2023 0233 by Dorthula Nettles, RN Outcome: Progressing 05/08/2023 0227 by Dorthula Nettles, RN Outcome: Progressing   Problem: Pain Managment: Goal: General experience of comfort will improve 05/08/2023 0233 by Dorthula Nettles, RN Outcome: Progressing 05/08/2023 0227 by Dorthula Nettles, RN Outcome: Progressing   Problem: Safety: Goal: Ability to remain free from injury will improve 05/08/2023 0233 by Dorthula Nettles, RN Outcome: Progressing 05/08/2023 0227 by Dorthula Nettles, RN Outcome: Progressing   Problem: Skin Integrity: Goal: Risk for impaired skin integrity will decrease 05/08/2023 0233 by Dorthula Nettles, RN Outcome: Progressing 05/08/2023 0227 by Dorthula Nettles, RN Outcome: Progressing

## 2023-05-09 DIAGNOSIS — S72001A Fracture of unspecified part of neck of right femur, initial encounter for closed fracture: Secondary | ICD-10-CM | POA: Diagnosis not present

## 2023-05-09 LAB — CBC WITH DIFFERENTIAL/PLATELET
Abs Immature Granulocytes: 0.06 10*3/uL (ref 0.00–0.07)
Basophils Absolute: 0 10*3/uL (ref 0.0–0.1)
Basophils Relative: 0 %
Eosinophils Absolute: 0 10*3/uL (ref 0.0–0.5)
Eosinophils Relative: 0 %
HCT: 29.2 % — ABNORMAL LOW (ref 39.0–52.0)
Hemoglobin: 9.4 g/dL — ABNORMAL LOW (ref 13.0–17.0)
Immature Granulocytes: 1 %
Lymphocytes Relative: 17 %
Lymphs Abs: 1.4 10*3/uL (ref 0.7–4.0)
MCH: 29 pg (ref 26.0–34.0)
MCHC: 32.2 g/dL (ref 30.0–36.0)
MCV: 90.1 fL (ref 80.0–100.0)
Monocytes Absolute: 1 10*3/uL (ref 0.1–1.0)
Monocytes Relative: 12 %
Neutro Abs: 5.7 10*3/uL (ref 1.7–7.7)
Neutrophils Relative %: 70 %
Platelets: 199 10*3/uL (ref 150–400)
RBC: 3.24 MIL/uL — ABNORMAL LOW (ref 4.22–5.81)
RDW: 13.8 % (ref 11.5–15.5)
WBC: 8.2 10*3/uL (ref 4.0–10.5)
nRBC: 0 % (ref 0.0–0.2)

## 2023-05-09 LAB — BASIC METABOLIC PANEL
Anion gap: 5 (ref 5–15)
BUN: 13 mg/dL (ref 8–23)
CO2: 26 mmol/L (ref 22–32)
Calcium: 8.3 mg/dL — ABNORMAL LOW (ref 8.9–10.3)
Chloride: 102 mmol/L (ref 98–111)
Creatinine, Ser: 0.64 mg/dL (ref 0.61–1.24)
GFR, Estimated: >60 mL/min (ref >60)
Glucose, Bld: 106 mg/dL — ABNORMAL HIGH (ref 70–99)
Potassium: 4.3 mmol/L (ref 3.5–5.1)
Sodium: 133 mmol/L — ABNORMAL LOW (ref 135–145)

## 2023-05-09 NOTE — Plan of Care (Signed)

## 2023-05-09 NOTE — Progress Notes (Signed)
OT Cancellation Note  Patient Details Name: [name] MRN: [id number] DOB: 11-07-1950   Cancelled Treatment:    Reason Eval/Treat Not Completed: Other (comment) Per ortho consult note, pt should be kept at bedrest and have aggressive dressing changes. Discussed with Dr Myriam Forehand, who said to hold until after surgery unless otherwise indicated by ortho. OT will sign off at this time,  please reconsult when pt is ready to participate.   Oleta Mouse, OTD OTR/L  05/09/23, 10:40 AM

## 2023-05-09 NOTE — Progress Notes (Signed)
PT Cancellation Note  Patient Details Name: [name] MRN: [id number] DOB: 08-Feb-1951   Cancelled Treatment:    Reason Eval/Treat Not Completed: Other (comment):  Orders Received. Chart Reviewed. Per Ortho Consult Note in chart, patient is on bedrest. Discussed with MD and care team vis secure chart, with MD reporting to hold therapy until after surgery unless otherwise indicated by ortho. PT will sign off at this time. PT will reconsult when medically appropriate.   Howie Ill, PT, DPT 05/09/23 10:44 AM

## 2023-05-09 NOTE — TOC Initial Note (Signed)
Transition of Care [facility]) - Initial/Assessment Note    Patient Details  Name: [name] MRN: [id number] Date of Birth: [birth year]  [facility] of [facility] CM/SW Contact:    Liliana Cline, LCSW Phone Number: 05/09/2023, 4:35 PM  Clinical Narrative:                 Patient not fully oriented and is on airborne isolation precautions. CSW called and spoke with contact person Kweli Vandemark. Timmy states he is a relative and also the owner of the group home patient resides at [facility].  Timmy states patient moved into [facility] approximately 2 weeks ago. Prior to that, he was at [facility] for short term rehab since September.  Patient recently started using a walker and wheelchair and has them at home.  Per Ortho, patient will need therapy evals AFTER surgery. Timmy verbalized understanding. He states the family feels patient will need more rehab to get stronger before returning to [facility]. He understands this is based on PT/OT evaluations and recommendations. Timmy states if STR is recommended, their SNF of choice is [facility]. TOC will continue to follow.   Expected Discharge Plan: Skilled Nursing Facility Barriers to Discharge: Continued Medical Work up   Patient Goals and CMS Choice Patient states their goals for this hospitalization and ongoing recovery are:: feels he will most likely need rehab CMS Medicare.gov Compare Post Acute Care list provided to:: Patient Represenative (must comment) Choice offered to / list presented to : NA      Expected Discharge Plan and Services       Living arrangements for the past 2 months: Group Home                                      Prior Living Arrangements/Services Living arrangements for the past 2 months: Group Home Lives with:: Facility Resident Patient language and need for interpreter reviewed:: Yes Do you feel safe going back to the place where you live?: Yes      Need for Family Participation  in Patient Care: Yes (Comment) Care giver support system in place?: Yes (comment) Current home services: DME Criminal Activity/Legal Involvement Pertinent to Current Situation/Hospitalization: No - Comment as needed  Activities of Daily Living   ADL Screening (condition at time of admission) Independently performs ADLs?: No Does the patient have a NEW difficulty with bathing/dressing/toileting/self-feeding that is expected to last >3 days?: Yes (Initiates electronic notice to provider for possible OT consult) Does the patient have a NEW difficulty with getting in/out of bed, walking, or climbing stairs that is expected to last >3 days?: Yes (Initiates electronic notice to provider for possible PT consult) Does the patient have a NEW difficulty with communication that is expected to last >3 days?: Yes (Initiates electronic notice to provider for possible SLP consult) Is the patient deaf or have difficulty hearing?: No Does the patient have difficulty seeing, even when wearing glasses/contacts?: No Does the patient have difficulty concentrating, remembering, or making decisions?: Yes  Permission Sought/Granted Permission sought to share information with : Facility Industrial/product designer granted to share information with : Yes, Verbal Permission Granted (by relative Cephus Shelling)     Permission granted to share info w AGENCY: as needed        Emotional Assessment       Orientation: : Fluctuating Orientation (Suspected and/or reported Sundowners) Alcohol / Substance Use: Not  Applicable Psych Involvement: No (comment)  Admission diagnosis:  Generalized weakness [R53.1] COVID-19 virus infection [U07.1] COVID-19 [U07.1] Patient Active Problem List   Diagnosis Date Noted   Closed right hip fracture (HCC) 05/08/2023   Protein-calorie malnutrition, severe 05/07/2023   Cellulitis of right hip 05/07/2023   Pressure injury of skin 05/07/2023   Chronic diastolic CHF (congestive  heart failure) (HCC) 05/06/2023   Hyperthyroidism 05/06/2023   Fall 03/17/2023   Scalp laceration 03/17/2023   Infection due to ESBL-producing Escherichia coli 03/17/2023   E coli bacteremia 03/15/2023   Hypothyroidism 03/05/2023   Syncope and collapse 03/04/2023   History of stroke 04/13/2021   Hyponatremia 04/10/2021   Wheeze    Unsteady gait    Thrombocytopenia (HCC)    COVID-19 virus infection    Elevated d-dimer    Depression    Elevated LFTs    Weakness 04/02/2021   Vitamin D deficiency 08/29/2015   Prediabetes 08/29/2015   Essential hypertension 08/28/2015   GERD (gastroesophageal reflux disease) 08/28/2015   Hyperlipidemia 08/28/2015   BPH (benign prostatic hyperplasia) 08/28/2015   Hx of sleep apnea 08/28/2015   COPD (chronic obstructive pulmonary disease) (HCC) 08/28/2015   Smoker 08/28/2015   Hx of lower gastrointestinal bleeding 08/28/2015   Hx of gastritis 08/28/2015   Bipolar 1 disorder (HCC) 08/28/2015   Schizophrenia (HCC) 08/28/2015   PCP:  Koren Bound, NP Pharmacy:   [facility] - [place], [place] - [address] [place] [address] Phone: [phone] Fax: [phone]     Social Determinants of Health (SDOH) Social History: SDOH Screenings   Food Insecurity: Patient Unable To Answer (05/06/2023)  Housing: Patient Unable To Answer (05/06/2023)  Transportation Needs: Patient Unable To Answer (05/06/2023)  Utilities: Patient Unable To Answer (05/06/2023)  Depression (PHQ2-9): Low Risk  (12/12/2020)  Tobacco Use: High Risk (05/06/2023)   SDOH Interventions:     Readmission Risk Interventions    05/09/2023    4:34 PM  Readmission Risk Prevention Plan  Transportation Screening Complete  PCP or Specialist Appt within 5-7 Days Complete  Home Care Screening Complete  Medication Review (RN CM) Complete

## 2023-05-09 NOTE — Progress Notes (Addendum)
Progress Note    [name]  SEG:[id number] DOB: [birth year]  DOA: 05/06/2023 PCP: Koren Bound, NP      Brief Narrative:    Medical records reviewed and are as summarized below:  [name] is a 72 y.o. male with medical history significant of dementia, bipolar, schizophrenia, hypertension, hyperlipidemia, COPD, diastolic CHF, stroke, hypothyroidism, UTI-ESBL, who presented to the hospital with general weakness.reportedly, he was found sitting on the couch covered in urine-soaked blanket.      Assessment/Plan:   Principal Problem:   Closed right hip fracture (HCC) Active Problems:   COVID-19 virus infection   COPD (chronic obstructive pulmonary disease) (HCC)   Cellulitis of right hip   Essential hypertension   Hyperlipidemia   Chronic diastolic CHF (congestive heart failure) (HCC)   Hyperthyroidism   Bipolar 1 disorder (HCC)   Schizophrenia (HCC)   BPH (benign prostatic hyperplasia)   Smoker   Protein-calorie malnutrition, severe   Pressure injury of skin   Body mass index is 26.95 kg/m.   Comminuted right hip fracture (intertrochanteric proximal right femur): Patient has been evaluated by Dr. Hyacinth Meeker, orthopedic surgeon.  He wants to delay surgery until patient has had adequate antibiotics for right hip cellulitis.  Continue Lovenox for DVT prophylaxis. Follow-up with Dr. Hyacinth Meeker, orthopedic surgeon.   Right hip cellulitis: Continue IV ceftriaxone.  Analgesics as needed for pain.   UTI has been ruled out.  No growth on urine culture.  IV meropenem has been discontinued.    COVID-19 infection: No respiratory symptoms or hypoxia.  Continue airborne and contact precautions.   COPD: Compensated.  Continue bronchodilators   Chronic diastolic CHF: Compensated.  2D echo in August 2024 showed EF estimated at 65%, grade 1 diastolic dysfunction.   Other comorbidities include bipolar disorder, schizophrenia, hyperthyroidism, BPH, hyperlipidemia,  hypertension   Plan of care was discussed with Mr. Lumley (nephew) and decision maker). According to Mr. Parady , patient had fallen about 4 months ago at the  [facility] when he  was trying to get an  ID.  He sustained some lacerations to the forehead because he fell on his face, and he was hospitalized at [facility].  He was released to a rehab facility where he spent about 2 weeks.  He said he did not complete rehab because his insurance will not pay for him to stay any longer.  He was then released to the group home.  He said he has been feeling weak since.  Diet Order             DIET DYS 3 Fluid consistency: Thin  Diet effective now                            Consultants: None  Procedures: None    Medications:    vitamin C  500 mg Oral BID   cholecalciferol  1,000 Units Oral Daily   citalopram  40 mg Oral Daily   enoxaparin (LOVENOX) injection  40 mg Subcutaneous Q24H   feeding supplement  237 mL Oral TID BM   Gerhardt's butt cream   Topical TID   leptospermum manuka honey  1 Application Topical Daily   methimazole  5 mg Oral TID   multivitamin with minerals  1 tablet Oral Daily   nicotine  21 mg Transdermal Daily   risperiDONE  1 mg Oral Daily   simvastatin  20 mg Oral Daily   sodium chloride flush  10-40  mL Intracatheter Q12H   tamsulosin  0.4 mg Oral QPC supper   zinc sulfate  220 mg Oral Daily   Continuous Infusions:  cefTRIAXone (ROCEPHIN)  IV Stopped (05/08/23 1731)     Anti-infectives (From admission, onward)    Start     Dose/Rate Route Frequency Ordered Stop   05/08/23 1600  cefTRIAXone (ROCEPHIN) 1 g in sodium chloride 0.9 % 100 mL IVPB        1 g 200 mL/hr over 30 Minutes Intravenous Every 24 hours 05/08/23 1510     05/07/23 2000  cefTRIAXone (ROCEPHIN) 1 g in sodium chloride 0.9 % 100 mL IVPB  Status:  Discontinued        1 g 200 mL/hr over 30 Minutes Intravenous Every 24 hours 05/06/23 2125 05/06/23 2125   05/06/23 2145  meropenem  (MERREM) 1 g in sodium chloride 0.9 % 100 mL IVPB  Status:  Discontinued        1 g 200 mL/hr over 30 Minutes Intravenous Every 8 hours 05/06/23 2137 05/08/23 0803   05/06/23 2045  cefTRIAXone (ROCEPHIN) 1 g in sodium chloride 0.9 % 100 mL IVPB        1 g 200 mL/hr over 30 Minutes Intravenous  Once 05/06/23 2034 05/06/23 2127              Family Communication/Anticipated D/C date and plan/Code Status   DVT prophylaxis: enoxaparin (LOVENOX) injection 40 mg Start: 05/06/23 2200     Code Status: Full Code  Family Communication: Plan discussed with Mr. Vibhu Reymundo (at [phone]) listed as emergency contact on 05/09/2023 at 2:42 PM today. Disposition Plan: Plan to discharge to group home   Status is: Observation The patient will require care spanning > 2 midnights and should be moved to inpatient because: Right hip fracture, right hip cellulitis       Subjective:   No acute events overnight.  He is confused and cannot provide an adequate history.  Objective:    Vitals:   05/08/23 1657 05/08/23 1740 05/08/23 2327 05/09/23 0735  BP: 132/67  (!) 142/75 (!) 148/79  Pulse: (!) 111 (!) 105 99 93  Resp: 17  19 16   Temp: 98.4 F (36.9 C)  99.1 F (37.3 C) 99.2 F (37.3 C)  TempSrc: Oral  Oral Oral  SpO2: 96%  98% 95%  Weight:      Height:       No data found.   Intake/Output Summary (Last 24 hours) at 05/09/2023 1241 Last data filed at 05/09/2023 0850 Gross per 24 hour  Intake 100 ml  Output 525 ml  Net -425 ml   Filed Weights   05/06/23 1055  Weight: 82.8 kg    Exam:  GEN: NAD SKIN: Warm and dry EYES: No pallor or icterus ENT: MMM CV: RRR PULM: CTA B ABD: soft, ND, NT, +BS CNS: AAO x 2 (person and place), non focal EXT: Mild right hip tenderness, erythema and swelling.  Superficial wound on right hip.       Data Reviewed:   I have personally reviewed following labs and imaging studies:  Labs: Labs show the following:   Basic  Metabolic Panel: Recent Labs  Lab 05/06/23 1150 05/07/23 0517 05/09/23 0419  NA 138 137 133*  K 3.9 3.5 4.3  CL 104 107 102  CO2 25 24 26   GLUCOSE 83 68* 106*  BUN 20 15 13   CREATININE 0.81 0.70 0.64  CALCIUM 9.1 8.2* 8.3*   GFR Estimated Creatinine  Clearance: 83.5 mL/min (by C-G formula based on SCr of 0.64 mg/dL). Liver Function Tests: Recent Labs  Lab 05/06/23 1150  AST 20  ALT 13  ALKPHOS 73  BILITOT 1.8*  PROT 7.4  ALBUMIN 2.7*   No results for input(s): "LIPASE", "AMYLASE" in the last 168 hours. No results for input(s): "AMMONIA" in the last 168 hours. Coagulation profile Recent Labs  Lab 05/07/23 2306  INR 1.3*    CBC: Recent Labs  Lab 05/06/23 1030 05/07/23 0517 05/08/23 0427 05/09/23 0419  WBC 11.4* 6.2 7.2 8.2  NEUTROABS 9.3*  --  6.2 5.7  HGB 11.9* 8.9* 8.7* 9.4*  HCT 38.7* 28.0* 26.6* 29.2*  MCV 92.6 90.6 89.9 90.1  PLT 219 169 175 199   Cardiac Enzymes: No results for input(s): "CKTOTAL", "CKMB", "CKMBINDEX", "TROPONINI" in the last 168 hours. BNP (last 3 results) No results for input(s): "PROBNP" in the last 8760 hours. CBG: No results for input(s): "GLUCAP" in the last 168 hours. D-Dimer: No results for input(s): "DDIMER" in the last 72 hours. Hgb A1c: No results for input(s): "HGBA1C" in the last 72 hours. Lipid Profile: No results for input(s): "CHOL", "HDL", "LDLCALC", "TRIG", "CHOLHDL", "LDLDIRECT" in the last 72 hours. Thyroid function studies: No results for input(s): "TSH", "T4TOTAL", "T3FREE", "THYROIDAB" in the last 72 hours.  Invalid input(s): "FREET3" Anemia work up: No results for input(s): "VITAMINB12", "FOLATE", "FERRITIN", "TIBC", "IRON", "RETICCTPCT" in the last 72 hours. Sepsis Labs: Recent Labs  Lab 05/06/23 1030 05/06/23 2147 05/07/23 0517 05/08/23 0427 05/09/23 0419  PROCALCITON  --   --  1.58  --   --   WBC 11.4*  --  6.2 7.2 8.2  LATICACIDVEN  --  0.8  --   --   --     Microbiology Recent Results  (from the past 240 hour(s))  Urine Culture     Status: None   Collection Time: 05/06/23  6:58 PM   Specimen: Urine, Random  Result Value Ref Range Status   Specimen Description   Final    URINE, RANDOM Performed at [facility], [address], [place], [place] [address]    Special Requests   Final    NONE Reflexed from [id number] Performed at [facility], [address], [place], [place] [address]    Culture   Final    NO GROWTH Performed at [facility], [address] [place], [place] [address]    Report Status 05/07/2023 FINAL  Final  Resp panel by RT-PCR (RSV, Flu A&B, Covid) Anterior Nasal Swab     Status: Abnormal   Collection Time: 05/06/23  8:12 PM   Specimen: Anterior Nasal Swab  Result Value Ref Range Status   SARS Coronavirus 2 by RT PCR POSITIVE (A) NEGATIVE Final    Comment: (NOTE) SARS-CoV-2 target nucleic acids are DETECTED.  The SARS-CoV-2 RNA is generally detectable in upper respiratory specimens during the acute phase of infection. Positive results are indicative of the presence of the identified virus, but do not rule out bacterial infection or co-infection with other pathogens not detected by the test. Clinical correlation with patient history and other diagnostic information is necessary to determine patient infection status. The expected result is Negative.  Fact Sheet for Patients: BloggerCourse.com  Fact Sheet for Healthcare Providers: SeriousBroker.it  This test is not yet approved or cleared by the [place] FDA and  has been authorized for detection and/or diagnosis of SARS-CoV-2 by FDA under an Emergency Use Authorization (EUA).  This EUA will remain in effect (meaning this test can be used) for the duration of  the COVID-19 declaration under Section 564(b)(1) of the A ct, 21 U.S.C. section 360bbb-3(b)(1), unless the authorization is terminated or revoked sooner.      Influenza A by PCR NEGATIVE NEGATIVE Final   Influenza B by PCR NEGATIVE NEGATIVE Final    Comment: (NOTE) The Xpert Xpress SARS-CoV-2/FLU/RSV plus assay is intended as an aid in the diagnosis of influenza from Nasopharyngeal swab specimens and should not be used as a sole basis for treatment. Nasal washings and aspirates are unacceptable for Xpert Xpress SARS-CoV-2/FLU/RSV testing.  Fact Sheet for Patients: BloggerCourse.com  Fact Sheet for Healthcare Providers: SeriousBroker.it  This test is not yet approved or cleared by the [place] FDA and has been authorized for detection and/or diagnosis of SARS-CoV-2 by FDA under an Emergency Use Authorization (EUA). This EUA will remain in effect (meaning this test can be used) for the duration of the COVID-19 declaration under Section 564(b)(1) of the Act, 21 U.S.C. section 360bbb-3(b)(1), unless the authorization is terminated or revoked.     Resp Syncytial Virus by PCR NEGATIVE NEGATIVE Final    Comment: (NOTE) Fact Sheet for Patients: BloggerCourse.com  Fact Sheet for Healthcare Providers: SeriousBroker.it  This test is not yet approved or cleared by the [place] FDA and has been authorized for detection and/or diagnosis of SARS-CoV-2 by FDA under an Emergency Use Authorization (EUA). This EUA will remain in effect (meaning this test can be used) for the duration of the COVID-19 declaration under Section 564(b)(1) of the Act, 21 U.S.C. section 360bbb-3(b)(1), unless the authorization is terminated or revoked.  Performed at [facility], [address] [place], [place] [address]   Culture, blood (x 2)     Status: None (Preliminary result)   Collection Time: 05/06/23  9:47 PM   Specimen: BLOOD  Result Value Ref Range Status   Specimen Description BLOOD BLOOD RIGHT ARM  Final   Special Requests   Final     BOTTLES DRAWN AEROBIC AND ANAEROBIC Blood Culture adequate volume   Culture   Final    NO GROWTH 3 DAYS Performed at [facility], [address] [place], [place] [address]    Report Status PENDING  Incomplete  Culture, blood (x 2)     Status: None (Preliminary result)   Collection Time: 05/06/23  9:47 PM   Specimen: BLOOD  Result Value Ref Range Status   Specimen Description BLOOD BLOOD LEFT ARM  Final   Special Requests   Final    BOTTLES DRAWN AEROBIC AND ANAEROBIC Blood Culture adequate volume   Culture   Final    NO GROWTH 3 DAYS Performed at [facility], [address] [place], [place] [address]    Report Status PENDING  Incomplete    Procedures and diagnostic studies:  CT HIP RIGHT W CONTRAST  Result Date: 05/07/2023 CLINICAL DATA:  Osteomyelitis, hip Soft tissue infection suspected, hip, no prior imaging EXAM: CT OF THE LOWER RIGHT EXTREMITY WITH CONTRAST TECHNIQUE: Multidetector CT imaging of the lower right extremity was performed according to the standard protocol following intravenous contrast administration. RADIATION DOSE REDUCTION: This exam was performed according to the departmental dose-optimization program which includes automated exposure control, adjustment of the mA and/or kV according to patient size and/or use of iterative reconstruction technique. CONTRAST:  OMNIPAQUE IOHEXOL 300 MG/ML  SOLN COMPARISON:  CT 03/18/2023 FINDINGS: Bones/Joint/Cartilage Acute, comminuted intertrochanteric fracture of the  proximal right femur with mild displacement and varus angulation. Hip joint alignment is maintained without dislocation. Mild right hip joint space narrowing. No additional fractures. No lytic or sclerotic bone lesion. Ligaments Suboptimally assessed by CT. Muscles and Tendons No acute musculotendinous abnormality by CT. Soft tissues Soft tissue swelling about the right hip. No organized fluid collection or hematoma. No right inguinal  lymphadenopathy. Atherosclerotic vascular calcifications. IMPRESSION: 1. Acute, comminuted intertrochanteric fracture of the proximal right femur with mild displacement and varus angulation. 2. Soft tissue swelling about the right hip. No organized fluid collection or hematoma. These results will be called to the ordering clinician or representative by the Radiologist Assistant, and communication documented in the PACS or [employer]. Electronically Signed   By: Duanne Guess D.O.   On: 05/07/2023 21:18               LOS: 1 day   Elaine Roanhorse  [facility]   Pager on [email or web address]. If 7PM-7AM, please contact night-coverage at [email or web address]     05/09/2023, 12:41 PM

## 2023-05-09 NOTE — Progress Notes (Signed)
Subjective:   Patient remains remains afebrile and his white blood count is normal.  He seems more alert today and conversant.  Pain is moderate.  Wound care nurses dealing with his wound and he is getting dressing changes.  This will need to be cleaned out before we can consider any surgery.    Patient reports pain as mild.  Objective:   VITALS:   Vitals:   05/08/23 2327 05/09/23 0735  BP: (!) 142/75 (!) 148/79  Pulse: 99 93  Resp: 19 16  Temp: 99.1 F (37.3 C) 99.2 F (37.3 C)  SpO2: 98% 95%    Neurologically intact Intact pulses distally Dorsiflexion/Plantar flexion intact  LABS Recent Labs    05/07/23 0517 05/08/23 0427 05/09/23 0419  HGB 8.9* 8.7* 9.4*  HCT 28.0* 26.6* 29.2*  WBC 6.2 7.2 8.2  PLT 169 175 199    Recent Labs    05/07/23 0517 05/09/23 0419  NA 137 133*  K 3.5 4.3  BUN 15 13  CREATININE 0.70 0.64  GLUCOSE 68* 106*    Recent Labs    05/07/23 2306  INR 1.3*     Assessment/Plan:    Continue antibiotics to deal with cellulitis and infection. Continue aggressive wound care. Will consider surgery at a later date if necessary.

## 2023-05-09 NOTE — Plan of Care (Signed)
Pt seemed more oriented compared to yesterday. Wounds care was done which he tolerated well. Problem: Education: Goal: Knowledge of General Education information will improve Description: Including pain rating scale, medication(s)/side effects and non-pharmacologic comfort measures Outcome: Progressing   Problem: Health Behavior/Discharge Planning: Goal: Ability to manage health-related needs will improve Outcome: Progressing   Problem: Clinical Measurements: Goal: Ability to maintain clinical measurements within normal limits will improve Outcome: Progressing Goal: Will remain free from infection Outcome: Progressing Goal: Diagnostic test results will improve Outcome: Progressing Goal: Respiratory complications will improve Outcome: Progressing Goal: Cardiovascular complication will be avoided Outcome: Progressing   Problem: Activity: Goal: Risk for activity intolerance will decrease Outcome: Progressing   Problem: Nutrition: Goal: Adequate nutrition will be maintained Outcome: Progressing   Problem: Coping: Goal: Level of anxiety will decrease Outcome: Progressing   Problem: Elimination: Goal: Will not experience complications related to bowel motility Outcome: Progressing Goal: Will not experience complications related to urinary retention Outcome: Progressing   Problem: Pain Managment: Goal: General experience of comfort will improve Outcome: Progressing   Problem: Safety: Goal: Ability to remain free from injury will improve Outcome: Progressing   Problem: Skin Integrity: Goal: Risk for impaired skin integrity will decrease Outcome: Progressing

## 2023-05-10 DIAGNOSIS — L03115 Cellulitis of right lower limb: Secondary | ICD-10-CM | POA: Diagnosis not present

## 2023-05-10 DIAGNOSIS — S72001A Fracture of unspecified part of neck of right femur, initial encounter for closed fracture: Secondary | ICD-10-CM | POA: Diagnosis not present

## 2023-05-10 DIAGNOSIS — J449 Chronic obstructive pulmonary disease, unspecified: Secondary | ICD-10-CM | POA: Diagnosis not present

## 2023-05-10 DIAGNOSIS — I10 Essential (primary) hypertension: Secondary | ICD-10-CM | POA: Diagnosis not present

## 2023-05-10 NOTE — Progress Notes (Signed)
[facility]  - [place] at [facility]   PATIENT NAME: [name]    MR#:  [id number]  DATE OF BIRTH:  Jan 31, 1951  SUBJECTIVE:  patient laying in bed comfortably. Not the best historian. No family at bedside. No fever. No other history per RN.   VITALS:  Blood pressure 139/84, pulse (!) 108, temperature 98.7 F (37.1 C), temperature source Oral, resp. rate 20, height 5\' 9"  (1.753 m), weight 76.5 kg, SpO2 98%.  PHYSICAL EXAMINATION:   GENERAL:  72 y.o.-year-old patient with no acute distress.  LUNGS: Normal breath sounds bilaterally CARDIOVASCULAR: S1, S2 normal.  ABDOMEN: Soft, nontender, nondistended. EXTREMITIES: No  edema b/l.    NEUROLOGIC: nonfocal  patient is alert and awake SKIN:   From 05/10/23--improving LABORATORY PANEL:  CBC Recent Labs  Lab 05/09/23 0419  WBC 8.2  HGB 9.4*  HCT 29.2*  PLT 199    Chemistries  Recent Labs  Lab 05/06/23 1150 05/07/23 0517 05/09/23 0419  NA 138   < > 133*  K 3.9   < > 4.3  CL 104   < > 102  CO2 25   < > 26  GLUCOSE 83   < > 106*  BUN 20   < > 13  CREATININE 0.81   < > 0.64  CALCIUM 9.1   < > 8.3*  AST 20  --   --   ALT 13  --   --   ALKPHOS 73  --   --   BILITOT 1.8*  --   --    < > = values in this interval not displayed.   Assessment and Plan  [name] is a 72 y.o. male with medical history significant of dementia, bipolar, schizophrenia, hypertension, hyperlipidemia, COPD, diastolic CHF, stroke, hypothyroidism, UTI-ESBL, who presented to the hospital with general weakness.reportedly, he was found sitting on the couch covered in urine-soaked blanket.   Comminuted right hip fracture (intertrochanteric proximal right femur): -- Patient has been evaluated by Dr. Hyacinth Meeker, orthopedic surgeon.   --He wants to delay surgery until patient has had adequate antibiotics for right hip cellulitis.  --Continue Lovenox for DVT prophylaxis. --Follow-up with Dr. Hyacinth Meeker, orthopedic surgeon.  Right hip  cellulitis: Continue IV ceftriaxone.  Analgesics as needed for pain. --evaluated by [facility]--appears to be improved   UTI has been ruled out.  No growth on urine culture.  IV meropenem has been discontinued.     COVID-19 infection: No respiratory symptoms or hypoxia.  Continue airborne and contact precautions.    COPD: Compensated.  Continue bronchodilators   Chronic diastolic CHF: Compensated.  2D echo in August 2024 showed EF estimated at 65%, grade 1 diastolic dysfunction.    Hyperthyroidism -Continue home methimazole   Bipolar 1 disorder (HCC) and schizophrenia (HCC): Patient is calm currently -Celexa, risperidone -Patient is not taking fluphenazine currently   BPH (benign prostatic hyperplasia) -Flomax       Procedures: Family communication :none today Consults :Ortho CODE STATUS: full DVT Prophylaxis :lovenox Level of care: Telemetry Medical Status is: Inpatient Remains inpatient appropriate because: awaiting Ortho to decide when surgery will take place    TOTAL TIME TAKING CARE OF THIS PATIENT: 35 minutes.  >50% time spent on counselling and coordination of care  Note: This dictation was prepared with Dragon dictation along with smaller phrase technology. Any transcriptional errors that result from this process are unintentional.  Enedina Finner M.D    [facility]   CC: Primary care physician; Koren Bound, NP

## 2023-05-10 NOTE — Plan of Care (Signed)

## 2023-05-10 NOTE — Plan of Care (Signed)

## 2023-05-10 NOTE — Progress Notes (Signed)
Subjective:    Patient's wound is healing slowly.  Wound care nurse is doing with this nicely.  I advised Dr. Allena Katz that the wound needs to be completely healed and off antibiotics for several days before any surgery could be considered.  The last thing we want to do is introducing infection into the bone.  Continue present treatment and we will continue to assess his progress.  Patient reports pain as mild.  Objective:   VITALS:   Vitals:   05/09/23 1428 05/09/23 2021  BP: 139/76 139/84  Pulse: (!) 101 (!) 108  Resp: 20   Temp: 98.7 F (37.1 C)   SpO2: 98% 98%    Neurologically intact  LABS Recent Labs    05/08/23 0427 05/09/23 0419  HGB 8.7* 9.4*  HCT 26.6* 29.2*  WBC 7.2 8.2  PLT 175 199    Recent Labs    05/09/23 0419  NA 133*  K 4.3  BUN 13  CREATININE 0.64  GLUCOSE 106*    Recent Labs    05/07/23 2306  INR 1.3*     Assessment/Plan:    Continue present treatment.

## 2023-05-11 DIAGNOSIS — I10 Essential (primary) hypertension: Secondary | ICD-10-CM | POA: Diagnosis not present

## 2023-05-11 DIAGNOSIS — L03115 Cellulitis of right lower limb: Secondary | ICD-10-CM | POA: Diagnosis not present

## 2023-05-11 DIAGNOSIS — J449 Chronic obstructive pulmonary disease, unspecified: Secondary | ICD-10-CM | POA: Diagnosis not present

## 2023-05-11 DIAGNOSIS — S72001A Fracture of unspecified part of neck of right femur, initial encounter for closed fracture: Secondary | ICD-10-CM | POA: Diagnosis not present

## 2023-05-11 LAB — CULTURE, BLOOD (ROUTINE X 2)
Culture: NO GROWTH
Culture: NO GROWTH
Special Requests: ADEQUATE
Special Requests: ADEQUATE

## 2023-05-11 NOTE — Progress Notes (Signed)
[facility]  - [place] at [facility] [place]   PATIENT NAME: [name]    MR#:  [id number]  DATE OF BIRTH:  1950-07-29  SUBJECTIVE:  patient laying in bed comfortably. Not the best historian. No family at bedside. No fever. No other history per RN. Spoke with [name]  and he is requesting second orthopedic opinion        VITALS:  Blood pressure 133/78, pulse (!) 109, temperature 98.5 F (36.9 C), resp. rate 20, height 5\' 9"  (1.753 m), weight 78.6 kg, SpO2 96%.  PHYSICAL EXAMINATION:   GENERAL:  72 y.o.-year-old patient with no acute distress.  LUNGS: Normal breath sounds bilaterally CARDIOVASCULAR: S1, S2 normal.  ABDOMEN: Soft, nontender, nondistended. EXTREMITIES: No  edema b/l.    NEUROLOGIC: nonfocal  patient is alert and awake SKIN:   From 05/10/23--improving LABORATORY PANEL:  CBC Recent Labs  Lab 05/09/23 0419  WBC 8.2  HGB 9.4*  HCT 29.2*  PLT 199    Chemistries  Recent Labs  Lab 05/06/23 1150 05/07/23 0517 05/09/23 0419  NA 138   < > 133*  K 3.9   < > 4.3  CL 104   < > 102  CO2 25   < > 26  GLUCOSE 83   < > 106*  BUN 20   < > 13  CREATININE 0.81   < > 0.64  CALCIUM 9.1   < > 8.3*  AST 20  --   --   ALT 13  --   --   ALKPHOS 73  --   --   BILITOT 1.8*  --   --    < > = values in this interval not displayed.   Assessment and Plan  [name] is a 72 y.o. male with medical history significant of dementia, bipolar, schizophrenia, hypertension, hyperlipidemia, COPD, diastolic CHF, stroke, hypothyroidism, UTI-ESBL, who presented to the hospital with general weakness.reportedly, he was found sitting on the couch covered in urine-soaked blanket.   Comminuted right hip fracture (intertrochanteric proximal right femur): -- Patient has been evaluated by Dr. Hyacinth Meeker, orthopedic surgeon.   --He wants to delay surgery until patient has had adequate antibiotics for right hip cellulitis.   --Continue Lovenox for DVT  prophylaxis. --Follow-up with Dr. Hyacinth Meeker, orthopedic surgeon. --10/22--Dr Audelia Acton to see pt--for second opinion per [name] request  Right hip cellulitis: Continue IV ceftriaxone.  Analgesics as needed for pain. --evaluated by [facility]--appears to be improved   UTI has been ruled out.  No growth on urine culture.      COVID-19 infection: No respiratory symptoms or hypoxia.  Continue airborne and contact precautions.    COPD: Compensated.  Continue bronchodilators   Chronic diastolic CHF: Compensated.  2D echo in August 2024 showed EF estimated at 65%, grade 1 diastolic dysfunction.    Hyperthyroidism -Continue home methimazole   Bipolar 1 disorder (HCC) and schizophrenia (HCC): Patient is calm currently -Celexa, risperidone -Patient is not taking fluphenazine currently   BPH (benign prostatic hyperplasia) -Flomax       Procedures: Family communication :Ronald Consults :Ortho CODE STATUS: full DVT Prophylaxis :lovenox Level of care: Telemetry Medical Status is: Inpatient Remains inpatient appropriate because: awaiting Ortho to decide when surgery will take place    TOTAL TIME TAKING CARE OF THIS PATIENT: 35 minutes.  >50% time spent on counselling and coordination of care  Note: This dictation was prepared with Dragon dictation along with smaller phrase technology. Any transcriptional errors that result from this process  are unintentional.  Enedina Finner M.D    [facility]   CC: Primary care physician; Koren Bound, NP

## 2023-05-11 NOTE — Plan of Care (Signed)

## 2023-05-11 NOTE — Consult Note (Signed)
ORTHOPAEDIC CONSULTATION  REQUESTING PHYSICIAN: Enedina Finner, MD  Chief Complaint:   Right intertrochanteric femur fracture  History of Present Illness: [name] is a 72 y.o. male with medical history significant of dementia, bipolar, schizophrenia, hypertension, hyperlipidemia, COPD, diastolic CHF, stroke, hypothyroidism, UTI-ESBL, who presented to the hospital with general weakness.reportedly, he was found sitting on the couch covered in urine-soaked blanket.  He was found to have a intertrochanteric femur fracture.  Patient was initially seen by one of my colleagues Dr. Hyacinth Meeker for evaluation due to wounds and cellulitis over the right hip and buttock.  Asked to evaluate patient for a second opinion.  Patient is resting comfortably when I go to see him is nonverbal but able to follow simple commands such as moving body parts.  Past Medical History:  Diagnosis Date   Bipolar disorder (HCC)    COPD (chronic obstructive pulmonary disease) (HCC)    Depression    GERD (gastroesophageal reflux disease)    GIB (gastrointestinal bleeding)    Hyperlipidemia    Hypertension    Mild dementia (HCC)    Paranoid schizophrenia (HCC)    Smoker    Tobacco dependence    History reviewed. No pertinent surgical history. Social History   Socioeconomic History   Marital status: Single    Spouse name: Not on file   Number of children: Not on file   Years of education: Not on file   Highest education level: Not on file  Occupational History   Not on file  Tobacco Use   Smoking status: Every Day    Current packs/day: 1.00    Types: Cigarettes   Smokeless tobacco: Never  Vaping Use   Vaping status: Never Used  Substance and Sexual Activity   Alcohol use: No    Alcohol/week: 0.0 standard drinks of alcohol   Drug use: No   Sexual activity: Never  Other Topics Concern   Not on file  Social History Narrative   Not on file    Social Determinants of Health   Financial Resource Strain: Not on file  Food Insecurity: Patient Unable To Answer (05/06/2023)   Hunger Vital Sign    Worried About Running Out of Food in the Last Year: Patient unable to answer    Ran Out of Food in the Last Year: Patient unable to answer  Transportation Needs: Patient Unable To Answer (05/06/2023)   PRAPARE - Administrator, Civil Service (Medical): Patient unable to answer    Lack of Transportation (Non-Medical): Patient unable to answer  Physical Activity: Not on file  Stress: Not on file  Social Connections: Not on file   Family History  Family history unknown: Yes   Allergies  Allergen Reactions   Asa [Aspirin]     Hx GI bleed   Prior to Admission medications   Medication Sig Start Date End Date Taking? Authorizing Provider  Cholecalciferol (VITAMIN D3) 1000 units CAPS Take 1 capsule (1,000 Units total) by mouth daily. 08/05/17  Yes Plonk, Chrissie Noa, MD  citalopram (CELEXA) 40 MG tablet Take 1 tablet (40 mg total) by mouth daily. 09/18/20  Yes Zena Amos, MD  methimazole (TAPAZOLE) 5 MG tablet Take 5 mg by mouth 3 (three) times daily. 01/11/23  Yes [provider]  risperiDONE (RISPERDAL) 1 MG tablet Take 1 tablet (1 mg total) by mouth daily. 09/18/20  Yes Zena Amos, MD  simvastatin (ZOCOR) 20 MG tablet TAKE 1 TABLET BY MOUTH DAILY 05/31/17  Yes Plonk, Chrissie Noa, MD  tamsulosin (  FLOMAX) 0.4 MG CAPS capsule TAKE 1 CAPSULE BY MOUTH DAILY AFTER SUPPER 12/11/16  Yes Plonk, William, MD  fluPHENAZine (PROLIXIN) 5 MG tablet Take 1 tablet (5 mg total) by mouth daily. Patient not taking: Reported on 05/06/2023 09/18/20   Zena Amos, MD   No results found.  Positive ROS: All other systems have been reviewed and were otherwise negative with the exception of those mentioned in the HPI and as above.  Physical Exam: General:  Alert, no acute distress Psychiatric: Patient is nonverbal on my exam today but alert and  along simple commands Cardiovascular:  No pedal edema Respiratory:  No wheezing, non-labored breathing GI:  Abdomen is soft and non-tender Neurologic:  Sensation appears intact distally Lymphatic:  No axillary or cervical lymphadenopathy  Orthopedic Exam:  Right lower extremity Shortened and externally rotated Over the posterior lateral right hip there are several lesions with eschar in place and small amount of pink tissue surrounding the area.  More posteriorly over the buttock there are visualized sacral decubitus lesions no active drainage noted to any of the lesions. No tenderness to palpation over the distal femur knee, tibia, shin or ankle Able to dorsiflex and plantarflex the foot and toes Chronic changes to both feet   X-rays:  X-rays and CT scan show a right intertrochanteric femur fracture agree with radiology interpretation.  Assessment: Right into trochanteric femur fracture with overlying soft tissue lesion over the posterior lateral hip with improving cellulitis  Plan: After reviewing the clinical and radiographic images and the skin over the posterior lateral aspect of the right hip.  I do agree that there is concern as there is an eschar which was previously inflamed within the last few days currently on antibiotics.  This lesion is almost exactly where the incisions would be made for the intertrochanteric fixation via a femoral nail.  My concern would be going through an actively treated skin lesion could potentially transmit infection into the deeper soft tissue structures or even the proximal femur.  I agree with Dr. Hyacinth Meeker that waiting until these lesions are better healed and show no signs of infection off antibiotics will reduce the risk of putting an infection into the right femur.  All of this information was relayed with Dr. Allena Katz over the phone.    Reinaldo Berber MD  Beeper #:  [phone]  05/11/2023 4:38 PM

## 2023-05-11 NOTE — Plan of Care (Signed)

## 2023-05-12 DIAGNOSIS — S72001A Fracture of unspecified part of neck of right femur, initial encounter for closed fracture: Secondary | ICD-10-CM | POA: Diagnosis not present

## 2023-05-12 DIAGNOSIS — J449 Chronic obstructive pulmonary disease, unspecified: Secondary | ICD-10-CM | POA: Diagnosis not present

## 2023-05-12 DIAGNOSIS — L03115 Cellulitis of right lower limb: Secondary | ICD-10-CM | POA: Diagnosis not present

## 2023-05-12 DIAGNOSIS — I10 Essential (primary) hypertension: Secondary | ICD-10-CM | POA: Diagnosis not present

## 2023-05-12 NOTE — Plan of Care (Signed)

## 2023-05-12 NOTE — Plan of Care (Signed)
  Problem: Education: Goal: Knowledge of General Education information will improve Description: Including pain rating scale, medication(s)/side effects and non-pharmacologic comfort measures Outcome: Progressing   Problem: Nutrition: Goal: Adequate nutrition will be maintained Outcome: Progressing   Problem: Elimination: Goal: Will not experience complications related to urinary retention Outcome: Progressing   Problem: Safety: Goal: Ability to remain free from injury will improve Outcome: Progressing   Problem: Skin Integrity: Goal: Risk for impaired skin integrity will decrease Outcome: Progressing

## 2023-05-12 NOTE — Progress Notes (Signed)
[facility]  - [place] at [facility]   PATIENT NAME: [name]    MR#:  [id number]  DATE OF BIRTH:  [birth year]  SUBJECTIVE:  patient sitting in bed comfortably. Not the best historian. No family at bedside. No fever. No other history per RN.  VITALS:  Blood pressure 134/77, pulse 97, temperature 97.6 F (36.4 C), resp. rate 17, height 5\' 9"  (1.753 m), weight 79 kg, SpO2 97%.  PHYSICAL EXAMINATION:   GENERAL:  72 y.o.-year-old patient with no acute distress.  LUNGS: Normal breath sounds bilaterally CARDIOVASCULAR: S1, S2 normal.  ABDOMEN: Soft, nontender, nondistended. EXTREMITIES: No  edema b/l.    NEUROLOGIC: nonfocal  patient is alert and awake SKIN:   From 05/10/23--improving LABORATORY PANEL:  CBC Recent Labs  Lab 05/09/23 0419  WBC 8.2  HGB 9.4*  HCT 29.2*  PLT 199    Chemistries  Recent Labs  Lab 05/06/23 1150 05/07/23 0517 05/09/23 0419  NA 138   < > 133*  K 3.9   < > 4.3  CL 104   < > 102  CO2 25   < > 26  GLUCOSE 83   < > 106*  BUN 20   < > 13  CREATININE 0.81   < > 0.64  CALCIUM 9.1   < > 8.3*  AST 20  --   --   ALT 13  --   --   ALKPHOS 73  --   --   BILITOT 1.8*  --   --    < > = values in this interval not displayed.   Assessment and Plan  [name] is a 72 y.o. male with medical history significant of dementia, bipolar, schizophrenia, hypertension, hyperlipidemia, COPD, diastolic CHF, stroke, hypothyroidism, UTI-ESBL, who presented to the hospital with general weakness.reportedly, he was found sitting on the couch covered in urine-soaked blanket.   Comminuted right hip fracture (intertrochanteric proximal right femur): -- Patient has been evaluated by Dr. Hyacinth Meeker, orthopedic surgeon.   --He wants to delay surgery until patient has had adequate antibiotics for right hip cellulitis.   --Continue Lovenox for DVT prophylaxis. --Follow-up with Dr. Hyacinth Meeker, orthopedic surgeon. --10/22--Dr Audelia Acton to see pt--for second  opinion per Timmy Borden request--appreciate input --10/23-- Dr Hyacinth Meeker plans to do surgery next Tuesday.  Right hip cellulitis: Continue IV ceftriaxone.  Analgesics as needed for pain. --evaluated by [facility]--appears to be improved   COVID-19 infection: No respiratory symptoms or hypoxia.  Continue airborne and contact precautions.    COPD: Compensated.  Continue bronchodilators   Chronic diastolic CHF: Compensated.  2D echo in August 2024 showed EF estimated at 65%, grade 1 diastolic dysfunction.    Hyperthyroidism -Continue home methimazole   Bipolar 1 disorder (HCC) and schizophrenia (HCC): Patient is calm currently -Celexa, risperidone -Patient is not taking fluphenazine currently   BPH (benign prostatic hyperplasia) -Flomax       Procedures: Family communication :Timmy Consults :Ortho CODE STATUS: full DVT Prophylaxis :lovenox Level of care: Telemetry Medical Status is: Inpatient Remains inpatient appropriate because: pt will stay till right hip surgery is done next week    TOTAL TIME TAKING CARE OF THIS PATIENT: 35 minutes.  >50% time spent on counselling and coordination of care  Note: This dictation was prepared with Dragon dictation along with smaller phrase technology. Any transcriptional errors that result from this process are unintentional.  Enedina Finner M.D    [facility]   CC: Primary care physician; Koren Bound, NP

## 2023-05-12 NOTE — Progress Notes (Signed)
Subjective:   The patient is stable.  He is alert but has minimal communication.  His vital signs remain normal.  He is afebrile.  His white count remains normal.  Hemoglobin is gradually improving.  Not in a great deal of pain apparently. He is getting wound care for his sacral ulcers and his right trochanteric wound.  The soft tissue swelling and induration is improved.  He still has a wound laterally which is healing well. I would like to plan for surgery on the hip early next week probably Tuesday.  Continue antibiotics and wound care.  I would prefer to keep him here in the hospital possible.  Patient reports pain as mild.  Objective:   VITALS:   Vitals:   05/11/23 2100 05/12/23 0733  BP: 136/71 134/77  Pulse: (!) 109 97  Resp:  17  Temp: 99.2 F (37.3 C) 97.6 F (36.4 C)  SpO2: 96% 97%    Neurologically intact Dorsiflexion/Plantar flexion intact  LABS No results for input(s): "HGB", "HCT", "WBC", "PLT" in the last 72 hours.  No results for input(s): "NA", "K", "BUN", "CREATININE", "GLUCOSE" in the last 72 hours.  No results for input(s): "LABPT", "INR" in the last 72 hours.   Assessment/Plan:    Continue present present treatment plan.

## 2023-05-12 NOTE — Progress Notes (Signed)
Nutrition Follow-up  DOCUMENTATION CODES:   Severe malnutrition in context of chronic illness  INTERVENTION:   -Continue dysphagia 3 diet -Continue feeding assistance with meals -Continue Ensure Enlive po TID, each supplement provides 350 kcal and 20 grams of protein -Continue MVI with minerals daily -Continue 500 mg vitamin C BID -Continue 220 mg zinc sulfate daily x 14 days -Magic cup TID with meals, each supplement provides 290 kcal and 9 grams of protein   NUTRITION DIAGNOSIS:   Severe Malnutrition related to chronic illness (dementia) as evidenced by moderate fat depletion, severe fat depletion, moderate muscle depletion, severe muscle depletion, percent weight loss.  Ongoing  GOAL:   Patient will meet greater than or equal to 90% of their needs  Progressing   MONITOR:   PO intake, Supplement acceptance  REASON FOR ASSESSMENT:   Malnutrition Screening Tool    ASSESSMENT:   Pt with medical history significant of dementia, bipolar, schizophrenia, hypertension, hyperlipidemia, COPD, diastolic CHF, stroke, hypothyroidism, UTI-ESBL, who presents with weakness.  10/19- per ortho pt with rt intertochanteric hip fracture with overlying soft tissue cellulitis and infection  Reviewed I/O's: -1.7 L x 24 hours and -4.6 L since admission  UOP: 1.7 L x 24 hours  Per [facility] notes, pt with stage 3 pressure injury to rt buttock, MASD to sacrum/ coccyx/ buttocks, and full thickness wound to rt hip.   Per orthopedics notes, plan to clear up antibiotics prior to proceeding with surgery.   Pt with variable oral intake and is mostly taking Ensure supplements.    Wt stable since last visit.   Medications reviewed and include vitamin C, vitamin D3, lovenox, and zinc sulfate.   Labs reviewed: Na: 133.    Diet Order:   Diet Order             DIET DYS 3 Fluid consistency: Thin  Diet effective now                   EDUCATION NEEDS:   No education needs have been  identified at this time  Skin:  Skin Assessment: Skin Integrity Issues: Skin Integrity Issues:: Other (Comment), Stage III Stage II: - Stage III: rt buttock Other: full thickness wound to rt hip, MASD to sacrum/ coccyx/ buttocks  Last BM:  05/12/23  Height:   Ht Readings from Last 1 Encounters:  05/06/23 5\' 9"  (1.753 m)    Weight:   Wt Readings from Last 1 Encounters:  05/12/23 79 kg    Ideal Body Weight:  72.7 kg  BMI:  Body mass index is 25.72 kg/m.  Estimated Nutritional Needs:   Kcal:  2200-2300  Protein:  110-125 grams  Fluid:  > 2 L    Levada Schilling, RD, LDN, CDCES Registered Dietitian III Certified Diabetes Care and Education Specialist Please refer to [facility] for RD and/or RD on-call/weekend/after hours pager

## 2023-05-13 DIAGNOSIS — L03115 Cellulitis of right lower limb: Secondary | ICD-10-CM | POA: Diagnosis not present

## 2023-05-13 DIAGNOSIS — J449 Chronic obstructive pulmonary disease, unspecified: Secondary | ICD-10-CM | POA: Diagnosis not present

## 2023-05-13 DIAGNOSIS — I10 Essential (primary) hypertension: Secondary | ICD-10-CM | POA: Diagnosis not present

## 2023-05-13 DIAGNOSIS — S72001A Fracture of unspecified part of neck of right femur, initial encounter for closed fracture: Secondary | ICD-10-CM | POA: Diagnosis not present

## 2023-05-13 MED ORDER — CEFAZOLIN SODIUM-DEXTROSE 1-4 GM/50ML-% IV SOLN
1.0000 g | Freq: Three times a day (TID) | INTRAVENOUS | Status: DC
  Administered 2023-05-13 – 2023-05-15 (×6): 1 g via INTRAVENOUS
  Filled 2023-05-13 (×8): qty 50

## 2023-05-13 NOTE — Progress Notes (Signed)
I have scheduled him for surgery on Tuesday morning 05/18/23. Hopefully he'll be in good shape with his infections by then.

## 2023-05-13 NOTE — Progress Notes (Signed)
[facility]  - [place] at [facility]   PATIENT NAME: [name]    MR#:  [id number]  DATE OF BIRTH:  1950/08/18  SUBJECTIVE:  patient sitting in bed comfortably. Not the best historian. No family at bedside. No fever. No other history per RN.  VITALS:  Blood pressure 119/69, pulse (!) 110, temperature 98.8 F (37.1 C), resp. rate 19, height 5\' 9"  (1.753 m), weight 79 kg, SpO2 97%.  PHYSICAL EXAMINATION:   GENERAL:  72 y.o.-year-old patient with no acute distress.  LUNGS: Normal breath sounds bilaterally CARDIOVASCULAR: S1, S2 normal.  ABDOMEN: Soft, nontender, nondistended. EXTREMITIES: No  edema b/l.    NEUROLOGIC: nonfocal  patient is alert and awake SKIN:Improving picture taken on 05/13/23  LABORATORY PANEL:  CBC Recent Labs  Lab 05/09/23 0419  WBC 8.2  HGB 9.4*  HCT 29.2*  PLT 199    Chemistries  Recent Labs  Lab 05/09/23 0419  NA 133*  K 4.3  CL 102  CO2 26  GLUCOSE 106*  BUN 13  CREATININE 0.64  CALCIUM 8.3*   Assessment and Plan  [name] is a 72 y.o. male with medical history significant of dementia, bipolar, schizophrenia, hypertension, hyperlipidemia, COPD, diastolic CHF, stroke, hypothyroidism, UTI-ESBL, who presented to the hospital with general weakness.reportedly, he was found sitting on the couch covered in urine-soaked blanket.   Comminuted right hip fracture (intertrochanteric proximal right femur): -- Patient has been evaluated by Dr. Hyacinth Meeker, orthopedic surgeon.   --He wants to delay surgery until patient has had adequate antibiotics for right hip cellulitis.   --Continue Lovenox for DVT prophylaxis. --Follow-up with Dr. Hyacinth Meeker, orthopedic surgeon. --10/22--Dr Audelia Acton to see pt--for second opinion per Timmy Dier request--appreciate input --10/23-- Dr Hyacinth Meeker plans to do surgery next Tuesday. --10/24--skin wound looks better today. Changed to IV cefazolin after d/w ID RPH. Cont abxs till ortho surgery is completed  next Tuesday  Right hip cellulitis:  -- Analgesics as needed for pain. --evaluated by [facility]--appears to be improved   COVID-19 infection: No respiratory symptoms or hypoxia.  Continue airborne and contact precautions.    COPD: Compensated.  Continue bronchodilators   Chronic diastolic CHF: Compensated.  2D echo in August 2024 showed EF estimated at 65%, grade 1 diastolic dysfunction.    Hyperthyroidism -Continue home methimazole   Bipolar 1 disorder (HCC) and schizophrenia (HCC): Patient is calm currently -Celexa, risperidone -Patient is not taking fluphenazine currently   BPH (benign prostatic hyperplasia) -Flomax       Procedures: Family communication :Timmy (nephew)  Consults :Ortho CODE STATUS: full DVT Prophylaxis :lovenox Level of care: Telemetry Medical Status is: Inpatient Remains inpatient appropriate because: pt will stay till right hip surgery is done next week    TOTAL TIME TAKING CARE OF THIS PATIENT: 35 minutes.  >50% time spent on counselling and coordination of care  Note: This dictation was prepared with Dragon dictation along with smaller phrase technology. Any transcriptional errors that result from this process are unintentional.  Enedina Finner M.D    [facility]   CC: Primary care physician; Koren Bound, NP

## 2023-05-13 NOTE — Plan of Care (Signed)

## 2023-05-14 DIAGNOSIS — S72141A Displaced intertrochanteric fracture of right femur, initial encounter for closed fracture: Secondary | ICD-10-CM | POA: Diagnosis not present

## 2023-05-14 NOTE — Progress Notes (Signed)
1713 Dressing changed to right hip and Left buttock. Wounds cleaned with NS, medihoney applied covered with gauze and ABD pad. Pt tolerated.

## 2023-05-14 NOTE — Progress Notes (Signed)
Progress Note    [name]  ONG:[id number] DOB: 14-Sep-1950  DOA: 05/06/2023 PCP: Koren Bound, NP      Brief Narrative:    Medical records reviewed and are as summarized below:  [name] is a 72 y.o. male with medical history significant of dementia, bipolar, schizophrenia, hypertension, hyperlipidemia, COPD, diastolic CHF, stroke, hypothyroidism, UTI-ESBL, who presented to the hospital with general weakness.reportedly, he was found sitting on the couch covered in urine-soaked blanket.  According to [name] (nephew and decision maker), patient had fallen about 4 months ago at the [facility] when he  was trying to get an  ID.  He sustained some lacerations to the forehead because he fell on his face, and he was hospitalized at [facility].  He was released to a rehab facility where he spent about 2 weeks.  He said he did not complete rehab because his insurance will not pay for him to stay any longer.  He was then released to the group home.  He said he has been feeling weak since.    Assessment/Plan:   Principal Problem:   Closed displaced intertrochanteric fracture of right femur (HCC) Active Problems:   COVID-19 virus infection   COPD (chronic obstructive pulmonary disease) (HCC)   Cellulitis of right hip   Essential hypertension   Hyperlipidemia   Chronic diastolic CHF (congestive heart failure) (HCC)   Hyperthyroidism   Bipolar 1 disorder (HCC)   Schizophrenia (HCC)   BPH (benign prostatic hyperplasia)   Smoker   Protein-calorie malnutrition, severe   Pressure injury of skin   Body mass index is 25.72 kg/m.   Comminuted right hip fracture (intertrochanteric proximal right femur): Has been evaluated by Dr. Hyacinth Reid, orthopedic surgeon.  Right hip surgery to be delayed until that is complete resolution of right hip cellulitis.  Second opinion was sought from Dr. Audelia Reid, orthopedic surgeon.  He shared the same opinion and recommended that right hip cellulitis  be adequately treated prior to any right hip surgery. Follow-up with Dr. Hyacinth Reid, orthopedic surgeon.   Right hip cellulitis: Continue IV cefazolin.  Analgesics as needed for pain.    UTI has been ruled out.  No growth on urine culture.  IV meropenem has been discontinued.    COVID-19 infection: No respiratory symptoms or hypoxia.  Continue airborne and contact precautions.   COPD: Compensated.  Continue bronchodilators   Chronic diastolic CHF: Compensated.  2D echo in August 2024 showed EF estimated at 65%, grade 1 diastolic dysfunction.   Other comorbidities include bipolar disorder, schizophrenia, hyperthyroidism, BPH, hyperlipidemia, hypertension    Diet Order             DIET DYS 3 Fluid consistency: Thin  Diet effective now                            Consultants: None  Procedures: None    Medications:    vitamin C  500 mg Oral BID   cholecalciferol  1,000 Units Oral Daily   citalopram  40 mg Oral Daily   enoxaparin (LOVENOX) injection  40 mg Subcutaneous Q24H   feeding supplement  237 mL Oral TID BM   Gerhardt's butt cream   Topical TID   leptospermum manuka honey  1 Application Topical Daily   methimazole  5 mg Oral TID   multivitamin with minerals  1 tablet Oral Daily   nicotine  21 mg Transdermal Daily   risperiDONE  1  mg Oral Daily   simvastatin  20 mg Oral Daily   sodium chloride flush  10-40 mL Intracatheter Q12H   tamsulosin  0.4 mg Oral QPC supper   zinc sulfate  220 mg Oral Daily   Continuous Infusions:   ceFAZolin (ANCEF) IV 1 g (05/14/23 1313)     Anti-infectives (From admission, onward)    Start     Dose/Rate Route Frequency Ordered Stop   05/13/23 1400  ceFAZolin (ANCEF) IVPB 1 g/50 mL premix        1 g 100 mL/hr over 30 Minutes Intravenous Every 8 hours 05/13/23 1032     05/08/23 1600  cefTRIAXone (ROCEPHIN) 1 g in sodium chloride 0.9 % 100 mL IVPB  Status:  Discontinued        1 g 200 mL/hr over 30 Minutes  Intravenous Every 24 hours 05/08/23 1510 05/13/23 1032   05/07/23 2000  cefTRIAXone (ROCEPHIN) 1 g in sodium chloride 0.9 % 100 mL IVPB  Status:  Discontinued        1 g 200 mL/hr over 30 Minutes Intravenous Every 24 hours 05/06/23 2125 05/06/23 2125   05/06/23 2145  meropenem (MERREM) 1 g in sodium chloride 0.9 % 100 mL IVPB  Status:  Discontinued        1 g 200 mL/hr over 30 Minutes Intravenous Every 8 hours 05/06/23 2137 05/08/23 0803   05/06/23 2045  cefTRIAXone (ROCEPHIN) 1 g in sodium chloride 0.9 % 100 mL IVPB        1 g 200 mL/hr over 30 Minutes Intravenous  Once 05/06/23 2034 05/06/23 2127              Family Communication/Anticipated D/C date and plan/Code Status   DVT prophylaxis: enoxaparin (LOVENOX) injection 40 mg Start: 05/06/23 2200     Code Status: Full Code  Family Communication: Plan discussed with [name] (at [phone]) listed as emergency contact on 05/09/2023 at 2:42 PM today. Disposition Plan: Plan to discharge to group home  Status is: Inpatient Remains inpatient appropriate because: Right hip fracture         Subjective:   Interval events noted.  He has no complaints.  No pain in the right hip.  Objective:    Vitals:   05/13/23 1950 05/13/23 1955 05/14/23 0119 05/14/23 0812  BP: 129/63 129/63 135/79 (!) 141/76  Pulse: (!) 109 (!) 109 (!) 108 (!) 105  Resp: 18 18 18 17   Temp: 99.1 F (37.3 C) 99.1 F (37.3 C) 98.8 F (37.1 C) 98.9 F (37.2 C)  TempSrc: Oral   Oral  SpO2: 97% 97% 98% 94%  Weight:      Height:       No data found.   Intake/Output Summary (Last 24 hours) at 05/14/2023 1504 Last data filed at 05/14/2023 1313 Gross per 24 hour  Intake 460 ml  Output 1025 ml  Net -565 ml   Filed Weights   05/11/23 0457 05/12/23 0444 05/13/23 0500  Weight: 78.6 kg 79 kg 79 kg    Exam:  GEN: NAD SKIN: Warm and dry EYES: No pallor or icterus ENT: MMM CV: RRR PULM: CTA B ABD: soft, ND, NT, +BS CNS: AAO x  2 (person and place), non focal EXT: Erythema,swelling and tenderness on the right hip has improved.  Superficial wound on the right hip has improved as well.  It has formed an eschar.       Data Reviewed:   I have personally reviewed following labs  and imaging studies:  Labs: Labs show the following:   Basic Metabolic Panel: Recent Labs  Lab 05/09/23 0419  NA 133*  K 4.3  CL 102  CO2 26  GLUCOSE 106*  BUN 13  CREATININE 0.64  CALCIUM 8.3*   GFR Estimated Creatinine Clearance: 83.5 mL/min (by C-G formula based on SCr of 0.64 mg/dL). Liver Function Tests: No results for input(s): "AST", "ALT", "ALKPHOS", "BILITOT", "PROT", "ALBUMIN" in the last 168 hours.  No results for input(s): "LIPASE", "AMYLASE" in the last 168 hours. No results for input(s): "AMMONIA" in the last 168 hours. Coagulation profile Recent Labs  Lab 05/07/23 2306  INR 1.3*    CBC: Recent Labs  Lab 05/08/23 0427 05/09/23 0419  WBC 7.2 8.2  NEUTROABS 6.2 5.7  HGB 8.7* 9.4*  HCT 26.6* 29.2*  MCV 89.9 90.1  PLT 175 199   Cardiac Enzymes: No results for input(s): "CKTOTAL", "CKMB", "CKMBINDEX", "TROPONINI" in the last 168 hours. BNP (last 3 results) No results for input(s): "PROBNP" in the last 8760 hours. CBG: No results for input(s): "GLUCAP" in the last 168 hours. D-Dimer: No results for input(s): "DDIMER" in the last 72 hours. Hgb A1c: No results for input(s): "HGBA1C" in the last 72 hours. Lipid Profile: No results for input(s): "CHOL", "HDL", "LDLCALC", "TRIG", "CHOLHDL", "LDLDIRECT" in the last 72 hours. Thyroid function studies: No results for input(s): "TSH", "T4TOTAL", "T3FREE", "THYROIDAB" in the last 72 hours.  Invalid input(s): "FREET3" Anemia work up: No results for input(s): "VITAMINB12", "FOLATE", "FERRITIN", "TIBC", "IRON", "RETICCTPCT" in the last 72 hours. Sepsis Labs: Recent Labs  Lab 05/08/23 0427 05/09/23 0419  WBC 7.2 8.2    Microbiology Recent Results  (from the past 240 hour(s))  Urine Culture     Status: None   Collection Time: 05/06/23  6:58 PM   Specimen: Urine, Random  Result Value Ref Range Status   Specimen Description   Final    URINE, RANDOM Performed at [facility], [address], [place], [place] [address]    Special Requests   Final    NONE Reflexed from [id number] Performed at [facility], [address], [place], [place] [address]    Culture   Final    NO GROWTH Performed at [facility], [address], [place], [place] [address]    Report Status 05/07/2023 FINAL  Final  Resp panel by RT-PCR (RSV, Flu A&B, Covid) Anterior Nasal Swab     Status: Abnormal   Collection Time: 05/06/23  8:12 PM   Specimen: Anterior Nasal Swab  Result Value Ref Range Status   SARS Coronavirus 2 by RT PCR POSITIVE (A) NEGATIVE Final    Comment: (NOTE) SARS-CoV-2 target nucleic acids are DETECTED.  The SARS-CoV-2 RNA is generally detectable in upper respiratory specimens during the acute phase of infection. Positive results are indicative of the presence of the identified virus, but do not rule out bacterial infection or co-infection with other pathogens not detected by the test. Clinical correlation with patient history and other diagnostic information is necessary to determine patient infection status. The expected result is Negative.  Fact Sheet for Patients: BloggerCourse.com  Fact Sheet for Healthcare Providers: SeriousBroker.it  This test is not yet approved or cleared by the [place] FDA and  has been authorized for detection and/or diagnosis of SARS-CoV-2 by FDA under an Emergency Use Authorization (EUA).  This EUA will remain in effect (meaning this test can be used) for the duration of  the COVID-19  declaration under Section 564(b)(1) of the A ct, 21 U.S.C. section 360bbb-3(b)(1), unless the authorization is terminated or revoked sooner.      Influenza A by PCR NEGATIVE NEGATIVE Final   Influenza B by PCR NEGATIVE NEGATIVE Final    Comment: (NOTE) The Xpert Xpress SARS-CoV-2/FLU/RSV plus assay is intended as an aid in the diagnosis of influenza from Nasopharyngeal swab specimens and should not be used as a sole basis for treatment. Nasal washings and aspirates are unacceptable for Xpert Xpress SARS-CoV-2/FLU/RSV testing.  Fact Sheet for Patients: BloggerCourse.com  Fact Sheet for Healthcare Providers: SeriousBroker.it  This test is not yet approved or cleared by the [place] FDA and has been authorized for detection and/or diagnosis of SARS-CoV-2 by FDA under an Emergency Use Authorization (EUA). This EUA will remain in effect (meaning this test can be used) for the duration of the COVID-19 declaration under Section 564(b)(1) of the Act, 21 U.S.C. section 360bbb-3(b)(1), unless the authorization is terminated or revoked.     Resp Syncytial Virus by PCR NEGATIVE NEGATIVE Final    Comment: (NOTE) Fact Sheet for Patients: BloggerCourse.com  Fact Sheet for Healthcare Providers: SeriousBroker.it  This test is not yet approved or cleared by the [place] FDA and has been authorized for detection and/or diagnosis of SARS-CoV-2 by FDA under an Emergency Use Authorization (EUA). This EUA will remain in effect (meaning this test can be used) for the duration of the COVID-19 declaration under Section 564(b)(1) of the Act, 21 U.S.C. section 360bbb-3(b)(1), unless the authorization is terminated or revoked.  Performed at [facility], [address] [place], [place] [address]   Culture, blood (x 2)     Status: None   Collection Time: 05/06/23  9:47 PM   Specimen: BLOOD  Result Value Ref Range Status   Specimen Description BLOOD BLOOD RIGHT ARM  Final   Special Requests   Final    BOTTLES DRAWN  AEROBIC AND ANAEROBIC Blood Culture adequate volume   Culture   Final    NO GROWTH 5 DAYS Performed at [facility], [address], [place], [place] [address]    Report Status 05/11/2023 FINAL  Final  Culture, blood (x 2)     Status: None   Collection Time: 05/06/23  9:47 PM   Specimen: BLOOD  Result Value Ref Range Status   Specimen Description BLOOD BLOOD LEFT ARM  Final   Special Requests   Final    BOTTLES DRAWN AEROBIC AND ANAEROBIC Blood Culture adequate volume   Culture   Final    NO GROWTH 5 DAYS Performed at [facility], [address] [place], [place] [address]    Report Status 05/11/2023 FINAL  Final    Procedures and diagnostic studies:  No results found.             LOS: 6 days   Tameeka Luo  [facility]   Pager on [email or web address]. If 7PM-7AM, please contact night-coverage at [email or web address]     05/14/2023, 3:04 PM

## 2023-05-14 NOTE — Plan of Care (Signed)

## 2023-05-14 NOTE — TOC Progression Note (Signed)
Transition of Care [facility]) - Progression Note    Patient Details  Name: [name] MRN: [id number] Date of Birth: 07-Sep-1950  Transition of Care [facility]) CM/SW Contact  Margarito Liner, LCSW Phone Number: 05/14/2023, 9:46 AM  Clinical Narrative:  TOC continues to follow progress.  Expected Discharge Plan: Skilled Nursing Facility Barriers to Discharge: Continued Medical Work up  Expected Discharge Plan and Services       Living arrangements for the past 2 months: Group Home                                       Social Determinants of Health (SDOH) Interventions SDOH Screenings   Food Insecurity: Patient Unable To Answer (05/06/2023)  Housing: Patient Unable To Answer (05/06/2023)  Transportation Needs: Patient Unable To Answer (05/06/2023)  Utilities: Patient Unable To Answer (05/06/2023)  Depression (PHQ2-9): Low Risk  (12/12/2020)  Tobacco Use: High Risk (05/06/2023)    Readmission Risk Interventions    05/09/2023    4:34 PM  Readmission Risk Prevention Plan  Transportation Screening Complete  PCP or Specialist Appt within 5-7 Days Complete  Home Care Screening Complete  Medication Review (RN CM) Complete

## 2023-05-15 DIAGNOSIS — S72141A Displaced intertrochanteric fracture of right femur, initial encounter for closed fracture: Secondary | ICD-10-CM | POA: Diagnosis not present

## 2023-05-15 MED ORDER — CEFAZOLIN SODIUM-DEXTROSE 1-4 GM/50ML-% IV SOLN
1.0000 g | Freq: Three times a day (TID) | INTRAVENOUS | Status: DC
  Administered 2023-05-15 – 2023-05-18 (×8): 1 g via INTRAVENOUS
  Filled 2023-05-15 (×10): qty 50

## 2023-05-15 NOTE — Plan of Care (Signed)

## 2023-05-15 NOTE — Progress Notes (Signed)
Progress Note    [name]  YQI:[id number] DOB: 28-Feb-1951  DOA: 05/06/2023 PCP: Koren Bound, NP      Brief Narrative:    Medical records reviewed and are as summarized below:  [name] is a 72 y.o. male with medical history significant of dementia, bipolar, schizophrenia, hypertension, hyperlipidemia, COPD, diastolic CHF, stroke, hypothyroidism, UTI-ESBL, who presented to the hospital with general weakness.reportedly, he was found sitting on the couch covered in urine-soaked blanket.  According to Mr. Tikkanen (nephew and decision maker), patient had fallen about 4 months ago at the [facility] when he  was trying to get an  ID.  He sustained some lacerations to the forehead because he fell on his face, and he was hospitalized at [facility].  He was released to a rehab facility where he spent about 2 weeks.  He said he did not complete rehab because his insurance will not pay for him to stay any longer.  He was then released to the group home.  He said he has been feeling weak since.    Assessment/Plan:   Principal Problem:   Closed displaced intertrochanteric fracture of right femur (HCC) Active Problems:   COVID-19 virus infection   COPD (chronic obstructive pulmonary disease) (HCC)   Cellulitis of right hip   Essential hypertension   Hyperlipidemia   Chronic diastolic CHF (congestive heart failure) (HCC)   Hyperthyroidism   Bipolar 1 disorder (HCC)   Schizophrenia (HCC)   BPH (benign prostatic hyperplasia)   Smoker   Protein-calorie malnutrition, severe   Pressure injury of skin   Body mass index is 25.72 kg/m.   Comminuted right hip fracture (intertrochanteric proximal right femur): He has been evaluated by Dr. Hyacinth Meeker, orthopedic surgeon.  Right hip surgery to be delayed until there is complete resolution of right hip cellulitis.  Second opinion was sought from Dr. Audelia Acton, orthopedic surgeon.  He shared the same opinion and recommended that right hip  cellulitis be adequately treated prior to any right hip surgery. Tentative plan for surgery on 05/18/2023.  Follow-up with Dr. Hyacinth Meeker, orthopedic surgeon.   Right hip cellulitis: Continue IV cefazolin.  Analgesics as needed for pain.    UTI has been ruled out.  No growth on urine culture.  IV meropenem has been discontinued.    COVID-19 infection: No respiratory symptoms or hypoxia.  Continue airborne and contact precautions.   COPD: Compensated.  Continue bronchodilators   Chronic diastolic CHF: Compensated.  2D echo in August 2024 showed EF estimated at 65%, grade 1 diastolic dysfunction.   Other comorbidities include bipolar disorder, schizophrenia, hyperthyroidism, BPH, hyperlipidemia, hypertension    Diet Order             DIET DYS 3 Fluid consistency: Thin  Diet effective now                            Consultants: None  Procedures: None    Medications:    vitamin C  500 mg Oral BID   cholecalciferol  1,000 Units Oral Daily   citalopram  40 mg Oral Daily   enoxaparin (LOVENOX) injection  40 mg Subcutaneous Q24H   feeding supplement  237 mL Oral TID BM   Gerhardt's butt cream   Topical TID   leptospermum manuka honey  1 Application Topical Daily   methimazole  5 mg Oral TID   multivitamin with minerals  1 tablet Oral Daily   nicotine  21  mg Transdermal Daily   risperiDONE  1 mg Oral Daily   simvastatin  20 mg Oral Daily   sodium chloride flush  10-40 mL Intracatheter Q12H   tamsulosin  0.4 mg Oral QPC supper   zinc sulfate  220 mg Oral Daily   Continuous Infusions:   ceFAZolin (ANCEF) IV 1 g (05/15/23 0506)     Anti-infectives (From admission, onward)    Start     Dose/Rate Route Frequency Ordered Stop   05/13/23 1400  ceFAZolin (ANCEF) IVPB 1 g/50 mL premix        1 g 100 mL/hr over 30 Minutes Intravenous Every 8 hours 05/13/23 1032     05/08/23 1600  cefTRIAXone (ROCEPHIN) 1 g in sodium chloride 0.9 % 100 mL IVPB  Status:   Discontinued        1 g 200 mL/hr over 30 Minutes Intravenous Every 24 hours 05/08/23 1510 05/13/23 1032   05/07/23 2000  cefTRIAXone (ROCEPHIN) 1 g in sodium chloride 0.9 % 100 mL IVPB  Status:  Discontinued        1 g 200 mL/hr over 30 Minutes Intravenous Every 24 hours 05/06/23 2125 05/06/23 2125   05/06/23 2145  meropenem (MERREM) 1 g in sodium chloride 0.9 % 100 mL IVPB  Status:  Discontinued        1 g 200 mL/hr over 30 Minutes Intravenous Every 8 hours 05/06/23 2137 05/08/23 0803   05/06/23 2045  cefTRIAXone (ROCEPHIN) 1 g in sodium chloride 0.9 % 100 mL IVPB        1 g 200 mL/hr over 30 Minutes Intravenous  Once 05/06/23 2034 05/06/23 2127              Family Communication/Anticipated D/C date and plan/Code Status   DVT prophylaxis: enoxaparin (LOVENOX) injection 40 mg Start: 05/06/23 2200     Code Status: Full Code  Family Communication: Plan discussed with Mr. Kymel Jacka (at [phone]) listed as emergency contact on 05/09/2023 at 2:42 PM today. Disposition Plan: May need to rehab at [facility] after right hip surgery  Status is: Inpatient Remains inpatient appropriate because: Right hip fracture         Subjective:   No acute events overnight.  No pain in the right hip.  He has no complaints.  Objective:    Vitals:   05/14/23 2129 05/14/23 2224 05/15/23 0811 05/15/23 1235  BP: 131/70 137/69 126/77 120/63  Pulse: (!) 111 (!) 107 (!) 103 (!) 102  Resp: 18 18 16 15   Temp: 98.8 F (37.1 C) 98.8 F (37.1 C) 98.8 F (37.1 C) 98.5 F (36.9 C)  TempSrc:      SpO2: 96% 95% 94% 98%  Weight:      Height:       No data found.   Intake/Output Summary (Last 24 hours) at 05/15/2023 1241 Last data filed at 05/15/2023 1236 Gross per 24 hour  Intake 240 ml  Output 300 ml  Net -60 ml   Filed Weights   05/11/23 0457 05/12/23 0444 05/13/23 0500  Weight: 78.6 kg 79 kg 79 kg    Exam:   GEN: NAD SKIN: Warm and dry EYES: No pallor or icterus ENT:  MMM CV: RRR PULM: CTA B ABD: soft, ND, NT, +BS CNS: AAO x 2 (person and place), non focal EXT: Swelling, tenderness and erythema right hip is improved.  Wound on right hip has improved as well.      Data Reviewed:   I have personally  reviewed following labs and imaging studies:  Labs: Labs show the following:   Basic Metabolic Panel: Recent Labs  Lab 05/09/23 0419  NA 133*  K 4.3  CL 102  CO2 26  GLUCOSE 106*  BUN 13  CREATININE 0.64  CALCIUM 8.3*   GFR Estimated Creatinine Clearance: 83.5 mL/min (by C-G formula based on SCr of 0.64 mg/dL). Liver Function Tests: No results for input(s): "AST", "ALT", "ALKPHOS", "BILITOT", "PROT", "ALBUMIN" in the last 168 hours.  No results for input(s): "LIPASE", "AMYLASE" in the last 168 hours. No results for input(s): "AMMONIA" in the last 168 hours. Coagulation profile No results for input(s): "INR", "PROTIME" in the last 168 hours.   CBC: Recent Labs  Lab 05/09/23 0419  WBC 8.2  NEUTROABS 5.7  HGB 9.4*  HCT 29.2*  MCV 90.1  PLT 199   Cardiac Enzymes: No results for input(s): "CKTOTAL", "CKMB", "CKMBINDEX", "TROPONINI" in the last 168 hours. BNP (last 3 results) No results for input(s): "PROBNP" in the last 8760 hours. CBG: No results for input(s): "GLUCAP" in the last 168 hours. D-Dimer: No results for input(s): "DDIMER" in the last 72 hours. Hgb A1c: No results for input(s): "HGBA1C" in the last 72 hours. Lipid Profile: No results for input(s): "CHOL", "HDL", "LDLCALC", "TRIG", "CHOLHDL", "LDLDIRECT" in the last 72 hours. Thyroid function studies: No results for input(s): "TSH", "T4TOTAL", "T3FREE", "THYROIDAB" in the last 72 hours.  Invalid input(s): "FREET3" Anemia work up: No results for input(s): "VITAMINB12", "FOLATE", "FERRITIN", "TIBC", "IRON", "RETICCTPCT" in the last 72 hours. Sepsis Labs: Recent Labs  Lab 05/09/23 0419  WBC 8.2    Microbiology Recent Results (from the past 240 hour(s))   Urine Culture     Status: None   Collection Time: 05/06/23  6:58 PM   Specimen: Urine, Random  Result Value Ref Range Status   Specimen Description   Final    URINE, RANDOM Performed at [facility], [address] [place], [place] [address]    Special Requests   Final    NONE Reflexed from [id number] Performed at [facility], [address] [place], [place] [address]    Culture   Final    NO GROWTH Performed at [facility], [address], [place], [place] [address]    Report Status 05/07/2023 FINAL  Final  Resp panel by RT-PCR (RSV, Flu A&B, Covid) Anterior Nasal Swab     Status: Abnormal   Collection Time: 05/06/23  8:12 PM   Specimen: Anterior Nasal Swab  Result Value Ref Range Status   SARS Coronavirus 2 by RT PCR POSITIVE (A) NEGATIVE Final    Comment: (NOTE) SARS-CoV-2 target nucleic acids are DETECTED.  The SARS-CoV-2 RNA is generally detectable in upper respiratory specimens during the acute phase of infection. Positive results are indicative of the presence of the identified virus, but do not rule out bacterial infection or co-infection with other pathogens not detected by the test. Clinical correlation with patient history and other diagnostic information is necessary to determine patient infection status. The expected result is Negative.  Fact Sheet for Patients: BloggerCourse.com  Fact Sheet for Healthcare Providers: SeriousBroker.it  This test is not yet approved or cleared by the [place] FDA and  has been authorized for detection and/or diagnosis of SARS-CoV-2 by FDA under an Emergency Use Authorization (EUA).  This EUA will remain in effect (meaning this test can be used) for the duration of  the COVID-19 declaration under Section 564(b)(1) of the A  ct, 21 U.S.C. section 360bbb-3(b)(1), unless the authorization is terminated or revoked sooner.     Influenza A by PCR  NEGATIVE NEGATIVE Final   Influenza B by PCR NEGATIVE NEGATIVE Final    Comment: (NOTE) The Xpert Xpress SARS-CoV-2/FLU/RSV plus assay is intended as an aid in the diagnosis of influenza from Nasopharyngeal swab specimens and should not be used as a sole basis for treatment. Nasal washings and aspirates are unacceptable for Xpert Xpress SARS-CoV-2/FLU/RSV testing.  Fact Sheet for Patients: BloggerCourse.com  Fact Sheet for Healthcare Providers: SeriousBroker.it  This test is not yet approved or cleared by the [place] FDA and has been authorized for detection and/or diagnosis of SARS-CoV-2 by FDA under an Emergency Use Authorization (EUA). This EUA will remain in effect (meaning this test can be used) for the duration of the COVID-19 declaration under Section 564(b)(1) of the Act, 21 U.S.C. section 360bbb-3(b)(1), unless the authorization is terminated or revoked.     Resp Syncytial Virus by PCR NEGATIVE NEGATIVE Final    Comment: (NOTE) Fact Sheet for Patients: BloggerCourse.com  Fact Sheet for Healthcare Providers: SeriousBroker.it  This test is not yet approved or cleared by the [place] FDA and has been authorized for detection and/or diagnosis of SARS-CoV-2 by FDA under an Emergency Use Authorization (EUA). This EUA will remain in effect (meaning this test can be used) for the duration of the COVID-19 declaration under Section 564(b)(1) of the Act, 21 U.S.C. section 360bbb-3(b)(1), unless the authorization is terminated or revoked.  Performed at [facility], [address] [place], [place] [address]   Culture, blood (x 2)     Status: None   Collection Time: 05/06/23  9:47 PM   Specimen: BLOOD  Result Value Ref Range Status   Specimen Description BLOOD BLOOD RIGHT ARM  Final   Special Requests   Final    BOTTLES DRAWN AEROBIC AND ANAEROBIC Blood  Culture adequate volume   Culture   Final    NO GROWTH 5 DAYS Performed at [facility], [address], [place], [place] [address]    Report Status 05/11/2023 FINAL  Final  Culture, blood (x 2)     Status: None   Collection Time: 05/06/23  9:47 PM   Specimen: BLOOD  Result Value Ref Range Status   Specimen Description BLOOD BLOOD LEFT ARM  Final   Special Requests   Final    BOTTLES DRAWN AEROBIC AND ANAEROBIC Blood Culture adequate volume   Culture   Final    NO GROWTH 5 DAYS Performed at [facility], [address], [place], [place] [address]    Report Status 05/11/2023 FINAL  Final    Procedures and diagnostic studies:  No results found.             LOS: 7 days   Gwyndolyn Guilford  [facility]   Pager on [email or web address]. If 7PM-7AM, please contact night-coverage at [email or web address]     05/15/2023, 12:41 PM

## 2023-05-15 NOTE — Plan of Care (Signed)

## 2023-05-16 DIAGNOSIS — S72141A Displaced intertrochanteric fracture of right femur, initial encounter for closed fracture: Secondary | ICD-10-CM | POA: Diagnosis not present

## 2023-05-16 NOTE — Plan of Care (Signed)

## 2023-05-16 NOTE — Progress Notes (Signed)
Subjective:   Procedure(s) (LRB): INTRAMEDULLARY (IM) NAIL INTERTROCHANTERIC (Right) Plan is in place to perform ORIF of the right hip fracture on Tuesday morning 05/18/2023.  I will do a final wound and skin check tomorrow morning.  Lab work will be ordered.  Please make sure we have the appropriate permissions and consents for this.  Patient reports pain as  not done today. .  Objective:   VITALS:   Vitals:   05/16/23 0202 05/16/23 0826  BP: 128/71 114/73  Pulse: (!) 108 (!) 106  Resp: 18 16  Temp:  98.6 F (37 C)  SpO2: 97% 91%    Not done today.  LABS No results for input(s): "HGB", "HCT", "WBC", "PLT" in the last 72 hours.  No results for input(s): "NA", "K", "BUN", "CREATININE", "GLUCOSE" in the last 72 hours.  No results for input(s): "LABPT", "INR" in the last 72 hours.   Assessment/Plan:   Procedure(s) (LRB): INTRAMEDULLARY (IM) NAIL INTERTROCHANTERIC (Right)  ORIF right hip fracture Tuesday, 05/18/2023.  Pending final skin check.

## 2023-05-16 NOTE — Progress Notes (Addendum)
Progress Note    [name]  ZOX:[id number] DOB: 05-06-1951  DOA: 05/06/2023 PCP: Koren Bound, NP      Brief Narrative:    Medical records reviewed and are as summarized below:  [name] is a 72 y.o. male with medical history significant of dementia, bipolar, schizophrenia, hypertension, hyperlipidemia, COPD, diastolic CHF, stroke, hypothyroidism, UTI-ESBL, who presented to the hospital with general weakness.reportedly, he was found sitting on the couch covered in urine-soaked blanket.  According to [name] (nephew and decision maker), patient had fallen about 4 months ago at the [facility] when he  was trying to get an  ID.  He sustained some lacerations to the forehead because he fell on his face, and he was hospitalized at [facility].  He was released to a rehab facility where he spent about 2 weeks.  He said he did not complete rehab because his insurance will not pay for him to stay any longer.  He was then released to the group home.  He said he has been feeling weak since.    Assessment/Plan:   Principal Problem:   Closed displaced intertrochanteric fracture of right femur (HCC) Active Problems:   COVID-19 virus infection   COPD (chronic obstructive pulmonary disease) (HCC)   Cellulitis of right hip   Essential hypertension   Hyperlipidemia   Chronic diastolic CHF (congestive heart failure) (HCC)   Hyperthyroidism   Bipolar 1 disorder (HCC)   Schizophrenia (HCC)   BPH (benign prostatic hyperplasia)   Smoker   Protein-calorie malnutrition, severe   Pressure injury of skin   Body mass index is 23.15 kg/m.   Comminuted right hip fracture (intertrochanteric proximal right femur): He has been evaluated by Dr. Hyacinth Meeker, orthopedic surgeon.  Right hip surgery to be delayed until there is complete resolution of right hip cellulitis.  Second opinion was sought from Dr. Audelia Acton, orthopedic surgeon.  He shared the same opinion and recommended that right hip  cellulitis be adequately treated prior to any right hip surgery. Tentative plan for surgery on 05/18/2023.  Follow-up with Dr. Hyacinth Meeker, orthopedic surgeon.   Right hip cellulitis: Improved.  Continue IV cefazolin.    UTI has been ruled out.  No growth on urine culture.    COVID-19 infection: No respiratory symptoms or hypoxia.  Positive SARS coronavirus test on 05/06/2023.  Okay to discontinue airborne and contact isolation.     COPD: Compensated.  Continue bronchodilators   Chronic diastolic CHF: Compensated.  2D echo in August 2024 showed EF estimated at 65%, grade 1 diastolic dysfunction.   Other comorbidities include bipolar disorder, schizophrenia, hyperthyroidism, BPH, hyperlipidemia, hypertension    Diet Order             DIET DYS 3 Fluid consistency: Thin  Diet effective now                            Consultants: None  Procedures: None    Medications:    vitamin C  500 mg Oral BID   cholecalciferol  1,000 Units Oral Daily   citalopram  40 mg Oral Daily   enoxaparin (LOVENOX) injection  40 mg Subcutaneous Q24H   feeding supplement  237 mL Oral TID BM   Gerhardt's butt cream   Topical TID   leptospermum manuka honey  1 Application Topical Daily   methimazole  5 mg Oral TID   multivitamin with minerals  1 tablet Oral Daily   nicotine  21 mg Transdermal Daily   risperiDONE  1 mg Oral Daily   simvastatin  20 mg Oral Daily   sodium chloride flush  10-40 mL Intracatheter Q12H   tamsulosin  0.4 mg Oral QPC supper   zinc sulfate  220 mg Oral Daily   Continuous Infusions:   ceFAZolin (ANCEF) IV 1 g (05/16/23 1023)     Anti-infectives (From admission, onward)    Start     Dose/Rate Route Frequency Ordered Stop   05/15/23 1900  ceFAZolin (ANCEF) IVPB 1 g/50 mL premix        1 g 100 mL/hr over 30 Minutes Intravenous Every 8 hours 05/15/23 1804     05/13/23 1400  ceFAZolin (ANCEF) IVPB 1 g/50 mL premix  Status:  Discontinued        1 g 100  mL/hr over 30 Minutes Intravenous Every 8 hours 05/13/23 1032 05/15/23 1804   05/08/23 1600  cefTRIAXone (ROCEPHIN) 1 g in sodium chloride 0.9 % 100 mL IVPB  Status:  Discontinued        1 g 200 mL/hr over 30 Minutes Intravenous Every 24 hours 05/08/23 1510 05/13/23 1032   05/07/23 2000  cefTRIAXone (ROCEPHIN) 1 g in sodium chloride 0.9 % 100 mL IVPB  Status:  Discontinued        1 g 200 mL/hr over 30 Minutes Intravenous Every 24 hours 05/06/23 2125 05/06/23 2125   05/06/23 2145  meropenem (MERREM) 1 g in sodium chloride 0.9 % 100 mL IVPB  Status:  Discontinued        1 g 200 mL/hr over 30 Minutes Intravenous Every 8 hours 05/06/23 2137 05/08/23 0803   05/06/23 2045  cefTRIAXone (ROCEPHIN) 1 g in sodium chloride 0.9 % 100 mL IVPB        1 g 200 mL/hr over 30 Minutes Intravenous  Once 05/06/23 2034 05/06/23 2127              Family Communication/Anticipated D/C date and plan/Code Status   DVT prophylaxis: enoxaparin (LOVENOX) injection 40 mg Start: 05/06/23 2200     Code Status: Full Code  Family Communication: Plan discussed with [name] (at [phone]) listed as emergency contact on 05/09/2023 at 2:42 PM today. Disposition Plan: May need to rehab at [facility] after right hip surgery  Status is: Inpatient Remains inpatient appropriate because: Right hip fracture         Subjective:   Interval events noted.  No complaints.  He feels better.  No pain in the right hip.  Objective:    Vitals:   05/15/23 2200 05/16/23 0202 05/16/23 0500 05/16/23 0826  BP: 134/66 128/71  114/73  Pulse: (!) 112 (!) 108  (!) 106  Resp: 18 18  16   Temp: 99.4 F (37.4 C)   98.6 F (37 C)  TempSrc:    Oral  SpO2: 98% 97%  91%  Weight:   71.1 kg   Height:       No data found.   Intake/Output Summary (Last 24 hours) at 05/16/2023 1321 Last data filed at 05/16/2023 0826 Gross per 24 hour  Intake 100 ml  Output 300 ml  Net -200 ml   Filed Weights   05/12/23 0444  05/13/23 0500 05/16/23 0500  Weight: 79 kg 79 kg 71.1 kg    Exam:  GEN: NAD SKIN: Warm and dry EYES: Anicteric ENT: MMM CV: RRR PULM: CTA B ABD: soft, ND, NT, +BS CNS: AAO x 2 (person and place), non focal  EXT: No edema, erythema or tenderness about the right hip.  Wound on right hip is improved     Data Reviewed:   I have personally reviewed following labs and imaging studies:  Labs: Labs show the following:   Basic Metabolic Panel: No results for input(s): "NA", "K", "CL", "CO2", "GLUCOSE", "BUN", "CREATININE", "CALCIUM", "MG", "PHOS" in the last 168 hours.  GFR Estimated Creatinine Clearance: 83.5 mL/min (by C-G formula based on SCr of 0.64 mg/dL). Liver Function Tests: No results for input(s): "AST", "ALT", "ALKPHOS", "BILITOT", "PROT", "ALBUMIN" in the last 168 hours.  No results for input(s): "LIPASE", "AMYLASE" in the last 168 hours. No results for input(s): "AMMONIA" in the last 168 hours. Coagulation profile No results for input(s): "INR", "PROTIME" in the last 168 hours.   CBC: No results for input(s): "WBC", "NEUTROABS", "HGB", "HCT", "MCV", "PLT" in the last 168 hours.  Cardiac Enzymes: No results for input(s): "CKTOTAL", "CKMB", "CKMBINDEX", "TROPONINI" in the last 168 hours. BNP (last 3 results) No results for input(s): "PROBNP" in the last 8760 hours. CBG: No results for input(s): "GLUCAP" in the last 168 hours. D-Dimer: No results for input(s): "DDIMER" in the last 72 hours. Hgb A1c: No results for input(s): "HGBA1C" in the last 72 hours. Lipid Profile: No results for input(s): "CHOL", "HDL", "LDLCALC", "TRIG", "CHOLHDL", "LDLDIRECT" in the last 72 hours. Thyroid function studies: No results for input(s): "TSH", "T4TOTAL", "T3FREE", "THYROIDAB" in the last 72 hours.  Invalid input(s): "FREET3" Anemia work up: No results for input(s): "VITAMINB12", "FOLATE", "FERRITIN", "TIBC", "IRON", "RETICCTPCT" in the last 72 hours. Sepsis Labs: No  results for input(s): "PROCALCITON", "WBC", "LATICACIDVEN" in the last 168 hours.   Microbiology Recent Results (from the past 240 hour(s))  Urine Culture     Status: None   Collection Time: 05/06/23  6:58 PM   Specimen: Urine, Random  Result Value Ref Range Status   Specimen Description   Final    URINE, RANDOM Performed at [facility], [address], [place], [place] [address]    Special Requests   Final    NONE Reflexed from [id number] Performed at [facility], [address], [place], [place] [address]    Culture   Final    NO GROWTH Performed at [facility], [address], [place], [place] [address]    Report Status 05/07/2023 FINAL  Final  Resp panel by RT-PCR (RSV, Flu A&B, Covid) Anterior Nasal Swab     Status: Abnormal   Collection Time: 05/06/23  8:12 PM   Specimen: Anterior Nasal Swab  Result Value Ref Range Status   SARS Coronavirus 2 by RT PCR POSITIVE (A) NEGATIVE Final    Comment: (NOTE) SARS-CoV-2 target nucleic acids are DETECTED.  The SARS-CoV-2 RNA is generally detectable in upper respiratory specimens during the acute phase of infection. Positive results are indicative of the presence of the identified virus, but do not rule out bacterial infection or co-infection with other pathogens not detected by the test. Clinical correlation with patient history and other diagnostic information is necessary to determine patient infection status. The expected result is Negative.  Fact Sheet for Patients: BloggerCourse.com  Fact Sheet for Healthcare Providers: SeriousBroker.it  This test is not yet approved or cleared by the [place] FDA and  has been authorized for detection and/or diagnosis of SARS-CoV-2 by FDA under an Emergency Use Authorization (EUA).  This EUA will remain in effect (meaning this test can be used) for the duration of  the  COVID-19 declaration under Section  564(b)(1) of the A ct, 21 U.S.C. section 360bbb-3(b)(1), unless the authorization is terminated or revoked sooner.     Influenza A by PCR NEGATIVE NEGATIVE Final   Influenza B by PCR NEGATIVE NEGATIVE Final    Comment: (NOTE) The Xpert Xpress SARS-CoV-2/FLU/RSV plus assay is intended as an aid in the diagnosis of influenza from Nasopharyngeal swab specimens and should not be used as a sole basis for treatment. Nasal washings and aspirates are unacceptable for Xpert Xpress SARS-CoV-2/FLU/RSV testing.  Fact Sheet for Patients: BloggerCourse.com  Fact Sheet for Healthcare Providers: SeriousBroker.it  This test is not yet approved or cleared by the [place] FDA and has been authorized for detection and/or diagnosis of SARS-CoV-2 by FDA under an Emergency Use Authorization (EUA). This EUA will remain in effect (meaning this test can be used) for the duration of the COVID-19 declaration under Section 564(b)(1) of the Act, 21 U.S.C. section 360bbb-3(b)(1), unless the authorization is terminated or revoked.     Resp Syncytial Virus by PCR NEGATIVE NEGATIVE Final    Comment: (NOTE) Fact Sheet for Patients: BloggerCourse.com  Fact Sheet for Healthcare Providers: SeriousBroker.it  This test is not yet approved or cleared by the [place] FDA and has been authorized for detection and/or diagnosis of SARS-CoV-2 by FDA under an Emergency Use Authorization (EUA). This EUA will remain in effect (meaning this test can be used) for the duration of the COVID-19 declaration under Section 564(b)(1) of the Act, 21 U.S.C. section 360bbb-3(b)(1), unless the authorization is terminated or revoked.  Performed at [facility], [address] [place], [place] [address]   Culture, blood (x 2)     Status: None   Collection Time: 05/06/23  9:47 PM   Specimen: BLOOD  Result Value  Ref Range Status   Specimen Description BLOOD BLOOD RIGHT ARM  Final   Special Requests   Final    BOTTLES DRAWN AEROBIC AND ANAEROBIC Blood Culture adequate volume   Culture   Final    NO GROWTH 5 DAYS Performed at [facility], [address] [place], [place] [address]    Report Status 05/11/2023 FINAL  Final  Culture, blood (x 2)     Status: None   Collection Time: 05/06/23  9:47 PM   Specimen: BLOOD  Result Value Ref Range Status   Specimen Description BLOOD BLOOD LEFT ARM  Final   Special Requests   Final    BOTTLES DRAWN AEROBIC AND ANAEROBIC Blood Culture adequate volume   Culture   Final    NO GROWTH 5 DAYS Performed at [facility], [address], [place], [place] [address]    Report Status 05/11/2023 FINAL  Final    Procedures and diagnostic studies:  No results found.             LOS: 8 days   Jacaria Colburn  [facility]   Pager on [email or web address]. If 7PM-7AM, please contact night-coverage at [email or web address]     05/16/2023, 1:21 PM

## 2023-05-17 DIAGNOSIS — S72141A Displaced intertrochanteric fracture of right femur, initial encounter for closed fracture: Secondary | ICD-10-CM | POA: Diagnosis not present

## 2023-05-17 LAB — CBC WITH DIFFERENTIAL/PLATELET
Abs Immature Granulocytes: 0.03 10*3/uL (ref 0.00–0.07)
Basophils Absolute: 0 10*3/uL (ref 0.0–0.1)
Basophils Relative: 0 %
Eosinophils Absolute: 0.1 10*3/uL (ref 0.0–0.5)
Eosinophils Relative: 1 %
HCT: 31.4 % — ABNORMAL LOW (ref 39.0–52.0)
Hemoglobin: 10 g/dL — ABNORMAL LOW (ref 13.0–17.0)
Immature Granulocytes: 0 %
Lymphocytes Relative: 19 %
Lymphs Abs: 1.5 10*3/uL (ref 0.7–4.0)
MCH: 28.3 pg (ref 26.0–34.0)
MCHC: 31.8 g/dL (ref 30.0–36.0)
MCV: 89 fL (ref 80.0–100.0)
Monocytes Absolute: 0.7 10*3/uL (ref 0.1–1.0)
Monocytes Relative: 9 %
Neutro Abs: 5.3 10*3/uL (ref 1.7–7.7)
Neutrophils Relative %: 71 %
Platelets: 286 10*3/uL (ref 150–400)
RBC: 3.53 MIL/uL — ABNORMAL LOW (ref 4.22–5.81)
RDW: 14 % (ref 11.5–15.5)
WBC: 7.5 10*3/uL (ref 4.0–10.5)
nRBC: 0 % (ref 0.0–0.2)

## 2023-05-17 LAB — COMPREHENSIVE METABOLIC PANEL
ALT: 15 U/L (ref 0–44)
AST: 20 U/L (ref 15–41)
Albumin: 2.5 g/dL — ABNORMAL LOW (ref 3.5–5.0)
Alkaline Phosphatase: 120 U/L (ref 38–126)
Anion gap: 8 (ref 5–15)
BUN: 16 mg/dL (ref 8–23)
CO2: 26 mmol/L (ref 22–32)
Calcium: 8.9 mg/dL (ref 8.9–10.3)
Chloride: 100 mmol/L (ref 98–111)
Creatinine, Ser: 0.67 mg/dL (ref 0.61–1.24)
GFR, Estimated: >60 mL/min (ref >60)
Glucose, Bld: 91 mg/dL (ref 70–99)
Potassium: 4 mmol/L (ref 3.5–5.1)
Sodium: 134 mmol/L — ABNORMAL LOW (ref 135–145)
Total Bilirubin: 0.9 mg/dL (ref 0.3–1.2)
Total Protein: 7.9 g/dL (ref 6.5–8.1)

## 2023-05-17 LAB — TYPE AND SCREEN
ABO/RH(D): O POS
Antibody Screen: NEGATIVE

## 2023-05-17 LAB — ABO/RH: ABO/RH(D): O POS

## 2023-05-17 LAB — PROTIME-INR
INR: 1.2 (ref 0.8–1.2)
Prothrombin Time: 15.6 s — ABNORMAL HIGH (ref 11.4–15.2)

## 2023-05-17 MED ORDER — CHLORHEXIDINE GLUCONATE 4 % EX SOLN: Freq: Once | CUTANEOUS | Status: AC

## 2023-05-17 MED ORDER — VANCOMYCIN HCL 1500 MG/300ML IV SOLN: 1500.0000 mg | Freq: Once | INTRAVENOUS | Status: DC

## 2023-05-17 MED ORDER — VANCOMYCIN HCL 1500 MG/300ML IV SOLN
1500.0000 mg | Freq: Once | INTRAVENOUS | Status: AC
  Administered 2023-05-18: 1500 mg via INTRAVENOUS
  Filled 2023-05-17: qty 300

## 2023-05-17 MED ORDER — ORAL CARE MOUTH RINSE: 15.0000 mL | OROMUCOSAL | Status: DC | PRN

## 2023-05-17 MED ORDER — ORAL CARE MOUTH RINSE
15.0000 mL | OROMUCOSAL | Status: DC
  Administered 2023-05-17 – 2023-05-25 (×26): 15 mL via OROMUCOSAL

## 2023-05-17 NOTE — Progress Notes (Signed)
Communicated with case manager and hospitalist today re: who will get consent for surgery tomorrow. Hospitalist said ortho will be getting consent for surgery.

## 2023-05-17 NOTE — Progress Notes (Signed)
Progress Note    [name]  WUJ:[id number] DOB: 06-13-1951  DOA: 05/06/2023 PCP: Koren Bound, NP      Brief Narrative:    Medical records reviewed and are as summarized below:  [name] is a 72 y.o. male with medical history significant of dementia, bipolar, schizophrenia, hypertension, hyperlipidemia, COPD, diastolic CHF, stroke, hypothyroidism, UTI-ESBL, who presented to the hospital with general weakness.reportedly, he was found sitting on the couch covered in urine-soaked blanket.  According to [name] (nephew and decision maker), patient had fallen about 4 months ago at the [facility] when he  was trying to get an  ID.  He sustained some lacerations to the forehead because he fell on his face, and he was hospitalized at [place].  He was released to a rehab facility where he spent about 2 weeks.  He said he did not complete rehab because his insurance will not pay for him to stay any longer.  He was then released to the group home.  He said he has been feeling weak since.    Assessment/Plan:   Principal Problem:   Closed displaced intertrochanteric fracture of right femur (HCC) Active Problems:   COVID-19 virus infection   COPD (chronic obstructive pulmonary disease) (HCC)   Cellulitis of right hip   Essential hypertension   Hyperlipidemia   Chronic diastolic CHF (congestive heart failure) (HCC)   Hyperthyroidism   Bipolar 1 disorder (HCC)   Schizophrenia (HCC)   BPH (benign prostatic hyperplasia)   Smoker   Protein-calorie malnutrition, severe   Pressure injury of skin   Body mass index is 23.41 kg/m.   Comminuted right hip fracture (intertrochanteric proximal right femur): He has been evaluated by Dr. Hyacinth Meeker, orthopedic surgeon.  Right hip surgery to be delayed until there is complete resolution of right hip cellulitis.  Second opinion was sought from Dr. Audelia Acton, orthopedic surgeon.  He shared the same opinion and recommended that right hip  cellulitis be adequately treated prior to any right hip surgery. Plan for right hip surgery tomorrow.  Follow-up with Dr. Hyacinth Meeker, orthopedic surgeon.   Right hip cellulitis: Improved.  Continue IV cefazolin.    UTI has been ruled out.  No growth on urine culture.    COVID-19 infection: No respiratory symptoms or hypoxia.  Positive SARS coronavirus test on 05/06/2023.  Okay to discontinue airborne and contact isolation.     COPD: Compensated.  Continue bronchodilators   Chronic diastolic CHF: Compensated.  2D echo in August 2024 showed EF estimated at 65%, grade 1 diastolic dysfunction.   Other comorbidities include bipolar disorder, schizophrenia, hyperthyroidism, BPH, hyperlipidemia, hypertension    Diet Order             Diet NPO time specified  Diet effective midnight           DIET DYS 3 Fluid consistency: Thin  Diet effective now                            Consultants: None  Procedures: None    Medications:    vitamin C  500 mg Oral BID   cholecalciferol  1,000 Units Oral Daily   citalopram  40 mg Oral Daily   enoxaparin (LOVENOX) injection  40 mg Subcutaneous Q24H   feeding supplement  237 mL Oral TID BM   Gerhardt's butt cream   Topical TID   leptospermum manuka honey  1 Application Topical Daily   methimazole  5 mg Oral TID   multivitamin with minerals  1 tablet Oral Daily   nicotine  21 mg Transdermal Daily   mouth rinse  15 mL Mouth Rinse 4 times per day   risperiDONE  1 mg Oral Daily   simvastatin  20 mg Oral Daily   sodium chloride flush  10-40 mL Intracatheter Q12H   tamsulosin  0.4 mg Oral QPC supper   zinc sulfate  220 mg Oral Daily   Continuous Infusions:   ceFAZolin (ANCEF) IV Stopped (05/17/23 0236)   [START ON 05/18/2023] vancomycin       Anti-infectives (From admission, onward)    Start     Dose/Rate Route Frequency Ordered Stop   05/18/23 0730  vancomycin (VANCOREADY) IVPB 1500 mg/300 mL  Status:  Discontinued         1,500 mg 150 mL/hr over 120 Minutes Intravenous  Once 05/17/23 1046 05/17/23 1046   05/18/23 0700  vancomycin (VANCOREADY) IVPB 1500 mg/300 mL        1,500 mg 150 mL/hr over 120 Minutes Intravenous  Once 05/17/23 1046     05/15/23 1900  ceFAZolin (ANCEF) IVPB 1 g/50 mL premix        1 g 100 mL/hr over 30 Minutes Intravenous Every 8 hours 05/15/23 1804     05/13/23 1400  ceFAZolin (ANCEF) IVPB 1 g/50 mL premix  Status:  Discontinued        1 g 100 mL/hr over 30 Minutes Intravenous Every 8 hours 05/13/23 1032 05/15/23 1804   05/08/23 1600  cefTRIAXone (ROCEPHIN) 1 g in sodium chloride 0.9 % 100 mL IVPB  Status:  Discontinued        1 g 200 mL/hr over 30 Minutes Intravenous Every 24 hours 05/08/23 1510 05/13/23 1032   05/07/23 2000  cefTRIAXone (ROCEPHIN) 1 g in sodium chloride 0.9 % 100 mL IVPB  Status:  Discontinued        1 g 200 mL/hr over 30 Minutes Intravenous Every 24 hours 05/06/23 2125 05/06/23 2125   05/06/23 2145  meropenem (MERREM) 1 g in sodium chloride 0.9 % 100 mL IVPB  Status:  Discontinued        1 g 200 mL/hr over 30 Minutes Intravenous Every 8 hours 05/06/23 2137 05/08/23 0803   05/06/23 2045  cefTRIAXone (ROCEPHIN) 1 g in sodium chloride 0.9 % 100 mL IVPB        1 g 200 mL/hr over 30 Minutes Intravenous  Once 05/06/23 2034 05/06/23 2127              Family Communication/Anticipated D/C date and plan/Code Status   DVT prophylaxis: enoxaparin (LOVENOX) injection 40 mg Start: 05/06/23 2200     Code Status: Full Code  Family Communication: None Disposition Plan: May need to re[facility] at [facility] after right hip surgery  Status is: Inpatient Remains inpatient appropriate because: Right hip fracture         Subjective:   No acute events overnight.  No pain in the right hip.  No respiratory symptoms.  He feels okay.  Objective:    Vitals:   05/16/23 1754 05/16/23 1948 05/17/23 0500 05/17/23 0743  BP: 126/71 136/65  125/65  Pulse: (!) 102 (!) 105  93   Resp: 18 18  14   Temp: (!) 100.8 F (38.2 C) 98.4 F (36.9 C)  98.3 F (36.8 C)  TempSrc: Oral   Oral  SpO2: 97% 95%  100%  Weight:   71.9 kg   Height:  No data found.   Intake/Output Summary (Last 24 hours) at 05/17/2023 1301 Last data filed at 05/17/2023 1014 Gross per 24 hour  Intake 150 ml  Output 400 ml  Net -250 ml   Filed Weights   05/13/23 0500 05/16/23 0500 05/17/23 0500  Weight: 79 kg 71.1 kg 71.9 kg    Exam:     GEN: NAD SKIN: Erythema, tenderness and swelling of the right hip have improved.  Right hip wound has improved EYES: No pallor or icterus ENT: MMM CV: RRR PULM: CTA B ABD: soft, ND, NT, +BS CNS: AAO x 2 (person and place), non focal EXT: No edema or tenderness   Data Reviewed:   I have personally reviewed following labs and imaging studies:  Labs: Labs show the following:   Basic Metabolic Panel: Recent Labs  Lab 05/17/23 0446  NA 134*  K 4.0  CL 100  CO2 26  GLUCOSE 91  BUN 16  CREATININE 0.67  CALCIUM 8.9    GFR Estimated Creatinine Clearance: 83.5 mL/min (by C-G formula based on SCr of 0.67 mg/dL). Liver Function Tests: Recent Labs  Lab 05/17/23 0446  AST 20  ALT 15  ALKPHOS 120  BILITOT 0.9  PROT 7.9  ALBUMIN 2.5*    No results for input(s): "LIPASE", "AMYLASE" in the last 168 hours. No results for input(s): "AMMONIA" in the last 168 hours. Coagulation profile Recent Labs  Lab 05/17/23 0446  INR 1.2     CBC: Recent Labs  Lab 05/17/23 0446  WBC 7.5  NEUTROABS 5.3  HGB 10.0*  HCT 31.4*  MCV 89.0  PLT 286    Cardiac Enzymes: No results for input(s): "CKTOTAL", "CKMB", "CKMBINDEX", "TROPONINI" in the last 168 hours. BNP (last 3 results) No results for input(s): "PROBNP" in the last 8760 hours. CBG: No results for input(s): "GLUCAP" in the last 168 hours. D-Dimer: No results for input(s): "DDIMER" in the last 72 hours. Hgb A1c: No results for input(s): "HGBA1C" in the last 72  hours. Lipid Profile: No results for input(s): "CHOL", "HDL", "LDLCALC", "TRIG", "CHOLHDL", "LDLDIRECT" in the last 72 hours. Thyroid function studies: No results for input(s): "TSH", "T4TOTAL", "T3FREE", "THYROIDAB" in the last 72 hours.  Invalid input(s): "FREET3" Anemia work up: No results for input(s): "VITAMINB12", "FOLATE", "FERRITIN", "TIBC", "IRON", "RETICCTPCT" in the last 72 hours. Sepsis Labs: Recent Labs  Lab 05/17/23 0446  WBC 7.5     Microbiology No results found for this or any previous visit (from the past 240 hour(s)).   Procedures and diagnostic studies:  No results found.             LOS: 9 days   Halvor Behrend  [facility] [employer] loss adjuster on [email or web address]. If 7PM-7AM, please contact night-coverage at [email or web address]     05/17/2023, 1:01 PM

## 2023-05-17 NOTE — Progress Notes (Signed)
Subjective:   Procedure(s) (LRB): INTRAMEDULLARY (IM) NAIL INTERTROCHANTERIC (Right) Patient is alert and cooperative today.  He says he does not have a great deal of pain.  His lab work looks very good.  Hemoglobin is up slightly from admission.  White blood count is normal.  Pro time is normal. His skin is healed up nicely.  No further ulcerations over the trochanteric region.  The induration is gone and there is no redness.  He sacral ulcerations are clean with small amount of granulation tissue and should not be a problem. Will we will plan to proceed with surgery tomorrow morning. I have advised the patient of the nature of his broken hip and need for pinning so that he may be mobilized.  He appears to understand this as best he can.  Risks and benefits were postop protocol were discussed.  Patient reports pain as mild.  Objective:   VITALS:   Vitals:   05/16/23 1948 05/17/23 0743  BP: 136/65 125/65  Pulse: (!) 105 93  Resp: 18 14  Temp: 98.4 F (36.9 C) 98.3 F (36.8 C)  SpO2: 95% 100%    Neurologically intact Sensation intact distally Dorsiflexion/Plantar flexion intact  LABS Recent Labs    05/17/23 0446  HGB 10.0*  HCT 31.4*  WBC 7.5  PLT 286    Recent Labs    05/17/23 0446  NA 134*  K 4.0  BUN 16  CREATININE 0.67  GLUCOSE 91    Recent Labs    05/17/23 0446  INR 1.2     Assessment/Plan:   Procedure(s) (LRB): INTRAMEDULLARY (IM) NAIL INTERTROCHANTERIC (Right) Plan for surgery tomorrow morning.  Open reduction internal fixation right hip.

## 2023-05-18 ENCOUNTER — Other Ambulatory Visit: Payer: Self-pay

## 2023-05-18 ENCOUNTER — Inpatient Hospital Stay: Payer: Medicare HMO

## 2023-05-18 ENCOUNTER — Inpatient Hospital Stay: Payer: Medicare HMO | Admitting: General Practice

## 2023-05-18 ENCOUNTER — Encounter: Payer: Self-pay | Admitting: Internal Medicine

## 2023-05-18 ENCOUNTER — Encounter
Admission: EM | Disposition: A | Discharge status: Skilled Nursing Facility | Payer: Self-pay | Source: Home / Self Care | Attending: Internal Medicine

## 2023-05-18 DIAGNOSIS — S72141A Displaced intertrochanteric fracture of right femur, initial encounter for closed fracture: Secondary | ICD-10-CM | POA: Diagnosis not present

## 2023-05-18 HISTORY — PX: INTRAMEDULLARY (IM) NAIL INTERTROCHANTERIC: SHX5875

## 2023-05-18 SURGERY — FIXATION, FRACTURE, INTERTROCHANTERIC, WITH INTRAMEDULLARY ROD
Anesthesia: General | Laterality: Right

## 2023-05-18 MED ORDER — METOCLOPRAMIDE HCL 5 MG/ML IJ SOLN: 5.0000 mg | Freq: Three times a day (TID) | INTRAMUSCULAR | Status: DC | PRN

## 2023-05-18 MED ORDER — VANCOMYCIN HCL IN DEXTROSE 1-5 GM/200ML-% IV SOLN
1000.0000 mg | Freq: Two times a day (BID) | INTRAVENOUS | Status: AC
  Administered 2023-05-18: 1000 mg via INTRAVENOUS
  Filled 2023-05-18: qty 200

## 2023-05-18 MED ORDER — ACETAMINOPHEN 325 MG PO TABS: 325.0000 mg | ORAL_TABLET | Freq: Four times a day (QID) | ORAL | Status: DC | PRN

## 2023-05-18 MED ORDER — DEXAMETHASONE SODIUM PHOSPHATE 10 MG/ML IJ SOLN
INTRAMUSCULAR | Status: DC | PRN
  Administered 2023-05-18: 5 mg via INTRAVENOUS

## 2023-05-18 MED ORDER — PHENYLEPHRINE 80 MCG/ML (10ML) SYRINGE FOR IV PUSH (FOR BLOOD PRESSURE SUPPORT)
PREFILLED_SYRINGE | INTRAVENOUS | Status: DC | PRN
  Administered 2023-05-18 (×4): 160 ug via INTRAVENOUS
  Administered 2023-05-18: 80 ug via INTRAVENOUS

## 2023-05-18 MED ORDER — FERROUS SULFATE 325 (65 FE) MG PO TABS
325.0000 mg | ORAL_TABLET | Freq: Every day | ORAL | Status: DC
  Administered 2023-05-19 – 2023-05-25 (×7): 325 mg via ORAL
  Filled 2023-05-18 (×7): qty 1

## 2023-05-18 MED ORDER — FENTANYL CITRATE (PF) 100 MCG/2ML IJ SOLN: 25.0000 ug | INTRAMUSCULAR | Status: DC | PRN

## 2023-05-18 MED ORDER — ALUM & MAG HYDROXIDE-SIMETH 200-200-20 MG/5ML PO SUSP
30.0000 mL | ORAL | Status: DC | PRN
Start: 2023-05-18 — End: 2023-05-25

## 2023-05-18 MED ORDER — GENTAMICIN SULFATE 40 MG/ML IJ SOLN
INTRAMUSCULAR | Status: AC
  Filled 2023-05-18: qty 2

## 2023-05-18 MED ORDER — LACTATED RINGERS IV SOLN: INTRAVENOUS | Status: DC | PRN

## 2023-05-18 MED ORDER — MORPHINE SULFATE (PF) 2 MG/ML IV SOLN: 0.5000 mg | INTRAVENOUS | Status: DC | PRN

## 2023-05-18 MED ORDER — TRANEXAMIC ACID-NACL 1000-0.7 MG/100ML-% IV SOLN
1000.0000 mg | Freq: Once | INTRAVENOUS | Status: AC
  Administered 2023-05-18: 1000 mg via INTRAVENOUS

## 2023-05-18 MED ORDER — ACETAMINOPHEN 10 MG/ML IV SOLN
INTRAVENOUS | Status: DC | PRN
  Administered 2023-05-18: 1000 mg via INTRAVENOUS

## 2023-05-18 MED ORDER — ROCURONIUM BROMIDE 100 MG/10ML IV SOLN
INTRAVENOUS | Status: DC | PRN
  Administered 2023-05-18: 50 mg via INTRAVENOUS
  Administered 2023-05-18: 10 mg via INTRAVENOUS

## 2023-05-18 MED ORDER — PHENYLEPHRINE HCL-NACL 20-0.9 MG/250ML-% IV SOLN
INTRAVENOUS | Status: DC | PRN
  Administered 2023-05-18: 30 ug/min via INTRAVENOUS

## 2023-05-18 MED ORDER — LIDOCAINE HCL (CARDIAC) PF 100 MG/5ML IV SOSY
PREFILLED_SYRINGE | INTRAVENOUS | Status: DC | PRN
  Administered 2023-05-18: 50 mg via INTRAVENOUS

## 2023-05-18 MED ORDER — ONDANSETRON HCL 4 MG/2ML IJ SOLN
INTRAMUSCULAR | Status: DC | PRN
  Administered 2023-05-18: 4 mg via INTRAVENOUS

## 2023-05-18 MED ORDER — PROPOFOL 10 MG/ML IV BOLUS
INTRAVENOUS | Status: AC
  Filled 2023-05-18: qty 20

## 2023-05-18 MED ORDER — ENOXAPARIN SODIUM 30 MG/0.3ML IJ SOSY
30.0000 mg | PREFILLED_SYRINGE | INTRAMUSCULAR | Status: DC
  Administered 2023-05-19 – 2023-05-21 (×3): 30 mg via SUBCUTANEOUS
  Filled 2023-05-18 (×3): qty 0.3

## 2023-05-18 MED ORDER — FLEET ENEMA RE ENEM: 1.0000 | ENEMA | Freq: Once | RECTAL | Status: DC | PRN

## 2023-05-18 MED ORDER — TRANEXAMIC ACID-NACL 1000-0.7 MG/100ML-% IV SOLN
INTRAVENOUS | Status: AC
  Filled 2023-05-18: qty 100

## 2023-05-18 MED ORDER — DEXMEDETOMIDINE HCL IN NACL 80 MCG/20ML IV SOLN
INTRAVENOUS | Status: AC
  Filled 2023-05-18: qty 40

## 2023-05-18 MED ORDER — METOCLOPRAMIDE HCL 5 MG PO TABS: 5.0000 mg | ORAL_TABLET | Freq: Three times a day (TID) | ORAL | Status: DC | PRN

## 2023-05-18 MED ORDER — ZOLPIDEM TARTRATE 5 MG PO TABS
5.0000 mg | ORAL_TABLET | Freq: Every evening | ORAL | Status: DC | PRN
  Administered 2023-05-22 – 2023-05-23 (×2): 5 mg via ORAL
  Filled 2023-05-18 (×2): qty 1

## 2023-05-18 MED ORDER — ALBUTEROL SULFATE HFA 108 (90 BASE) MCG/ACT IN AERS
INHALATION_SPRAY | RESPIRATORY_TRACT | Status: DC | PRN
  Administered 2023-05-18: 3 via RESPIRATORY_TRACT

## 2023-05-18 MED ORDER — SODIUM CHLORIDE 0.9 % IR SOLN
Status: DC | PRN
  Administered 2023-05-18: 1002 mL

## 2023-05-18 MED ORDER — BISACODYL 10 MG RE SUPP: 10.0000 mg | Freq: Every day | RECTAL | Status: DC | PRN

## 2023-05-18 MED ORDER — BUPIVACAINE-EPINEPHRINE (PF) 0.25% -1:200000 IJ SOLN
INTRAMUSCULAR | Status: DC | PRN
  Administered 2023-05-18: 60 mL via PERINEURAL

## 2023-05-18 MED ORDER — MENTHOL 3 MG MT LOZG: 1.0000 | LOZENGE | OROMUCOSAL | Status: DC | PRN

## 2023-05-18 MED ORDER — PHENYLEPHRINE HCL-NACL 20-0.9 MG/250ML-% IV SOLN
INTRAVENOUS | Status: AC
  Filled 2023-05-18: qty 250

## 2023-05-18 MED ORDER — BUPIVACAINE-EPINEPHRINE (PF) 0.25% -1:200000 IJ SOLN
INTRAMUSCULAR | Status: AC
  Filled 2023-05-18: qty 60

## 2023-05-18 MED ORDER — ACETAMINOPHEN 10 MG/ML IV SOLN
INTRAVENOUS | Status: AC
Start: 2023-05-18 — End: ?
  Filled 2023-05-18: qty 100

## 2023-05-18 MED ORDER — SODIUM CHLORIDE 0.45 % IV SOLN: INTRAVENOUS | Status: DC

## 2023-05-18 MED ORDER — PHENOL 1.4 % MT LIQD: 1.0000 | OROMUCOSAL | Status: DC | PRN

## 2023-05-18 MED ORDER — KETAMINE HCL 50 MG/5ML IJ SOSY
PREFILLED_SYRINGE | INTRAMUSCULAR | Status: AC
  Filled 2023-05-18: qty 5

## 2023-05-18 MED ORDER — SENNA 8.6 MG PO TABS
1.0000 | ORAL_TABLET | Freq: Two times a day (BID) | ORAL | Status: DC
  Administered 2023-05-18 – 2023-05-25 (×15): 8.6 mg via ORAL
  Filled 2023-05-18 (×15): qty 1

## 2023-05-18 MED ORDER — PROPOFOL 1000 MG/100ML IV EMUL
INTRAVENOUS | Status: AC
  Filled 2023-05-18: qty 100

## 2023-05-18 MED ORDER — SUGAMMADEX SODIUM 200 MG/2ML IV SOLN
INTRAVENOUS | Status: DC | PRN
  Administered 2023-05-18: 200 mg via INTRAVENOUS

## 2023-05-18 MED ORDER — GLYCOPYRROLATE 0.2 MG/ML IJ SOLN
INTRAMUSCULAR | Status: DC | PRN
  Administered 2023-05-18: .2 mg via INTRAVENOUS

## 2023-05-18 MED ORDER — MIDAZOLAM HCL 2 MG/2ML IJ SOLN
INTRAMUSCULAR | Status: AC
  Filled 2023-05-18: qty 2

## 2023-05-18 MED ORDER — PROPOFOL 10 MG/ML IV BOLUS
INTRAVENOUS | Status: AC
Start: 2023-05-18 — End: ?
  Filled 2023-05-18: qty 20

## 2023-05-18 MED ORDER — KETAMINE HCL 50 MG/5ML IJ SOSY
PREFILLED_SYRINGE | INTRAMUSCULAR | Status: DC | PRN
  Administered 2023-05-18: 20 mg via INTRAVENOUS

## 2023-05-18 MED ORDER — DROPERIDOL 2.5 MG/ML IJ SOLN: 0.6250 mg | Freq: Once | INTRAMUSCULAR | Status: DC | PRN

## 2023-05-18 MED ORDER — FENTANYL CITRATE (PF) 100 MCG/2ML IJ SOLN
INTRAMUSCULAR | Status: DC | PRN
  Administered 2023-05-18 (×2): 50 ug via INTRAVENOUS

## 2023-05-18 MED ORDER — FENTANYL CITRATE (PF) 100 MCG/2ML IJ SOLN
INTRAMUSCULAR | Status: AC
  Filled 2023-05-18: qty 2

## 2023-05-18 MED ORDER — HYDROCODONE-ACETAMINOPHEN 5-325 MG PO TABS
1.0000 | ORAL_TABLET | ORAL | Status: DC | PRN
Start: 2023-05-18 — End: 2023-05-25
  Administered 2023-05-22 – 2023-05-24 (×3): 1 via ORAL
  Filled 2023-05-18 (×3): qty 1

## 2023-05-18 SURGICAL SUPPLY — 46 items
APL PRP STRL LF DISP 70% ISPRP (MISCELLANEOUS) ×2
BIT DRILL CANN 16 HIP (BIT) IMPLANT
BIT DRILL CANN STP 6/9 HIP (BIT) IMPLANT
BIT DRILL LONG 4.2 (BIT) IMPLANT
BIT DRILL TAPERED 10 (BIT) IMPLANT
BLADE HELICAL TFNA 105 (Anchor) IMPLANT
BNDG CMPR 5X4 CHSV STRCH STRL (GAUZE/BANDAGES/DRESSINGS) ×1
BNDG COHESIVE 4X5 TAN STRL LF (GAUZE/BANDAGES/DRESSINGS) ×1 IMPLANT
CHLORAPREP W/TINT 26 (MISCELLANEOUS) ×2 IMPLANT
DRAPE INCISE 23X17 STRL (DRAPES) ×1 IMPLANT
DRAPE INCISE IOBAN 23X17 STRL (DRAPES) ×1 IMPLANT
DRSG AQUACEL AG ADV 3.5X10 (GAUZE/BANDAGES/DRESSINGS) IMPLANT
DRSG AQUACEL AG ADV 3.5X14 (GAUZE/BANDAGES/DRESSINGS) IMPLANT
DRSG TEGADERM 2-3/8X2-3/4 SM (GAUZE/BANDAGES/DRESSINGS) IMPLANT
ELECT REM PT RETURN 9FT ADLT (ELECTROSURGICAL) ×1
ELECTRODE REM PT RTRN 9FT ADLT (ELECTROSURGICAL) ×1 IMPLANT
GAUZE SPONGE 4X4 12PLY STRL (GAUZE/BANDAGES/DRESSINGS) ×1 IMPLANT
GAUZE XEROFORM 1X8 LF (GAUZE/BANDAGES/DRESSINGS) IMPLANT
GLOVE INDICATOR 8.0 STRL GRN (GLOVE) ×1 IMPLANT
GLOVE SURG ORTHO 8.5 STRL (GLOVE) ×1 IMPLANT
GOWN STRL REUS W/ TWL LRG LVL3 (GOWN DISPOSABLE) ×1 IMPLANT
GOWN STRL REUS W/TWL LRG LVL3 (GOWN DISPOSABLE) ×1
GOWN STRL REUS W/TWL LRG LVL4 (GOWN DISPOSABLE) ×1 IMPLANT
GUIDEWIRE 3.2X400 (WIRE) IMPLANT
IMPL DEG TI CANN 11MM/130 (Orthopedic Implant) IMPLANT
IMPLANT DEG TI CANN 11MM/130 (Orthopedic Implant) ×1 IMPLANT
KIT TURNOVER KIT A (KITS) ×1 IMPLANT
MANIFOLD NEPTUNE II (INSTRUMENTS) ×1 IMPLANT
MAT ABSORB FLUID 56X50 GRAY (MISCELLANEOUS) ×1 IMPLANT
NDL SPNL 18GX3.5 QUINCKE PK (NEEDLE) ×1 IMPLANT
NEEDLE SPNL 18GX3.5 QUINCKE PK (NEEDLE) ×1 IMPLANT
NS IRRIG 500ML POUR BTL (IV SOLUTION) ×1 IMPLANT
PACK HIP COMPR (MISCELLANEOUS) ×1 IMPLANT
PAD ABD DERMACEA PRESS 5X9 (GAUZE/BANDAGES/DRESSINGS) IMPLANT
SCREW LOCK STAR 5X40 (Screw) IMPLANT
SOL PREP PVP 2OZ (MISCELLANEOUS) ×1
SOLUTION PREP PVP 2OZ (MISCELLANEOUS) ×1 IMPLANT
STAPLER SKIN PROX 35W (STAPLE) ×1 IMPLANT
SUCTION TUBE FRAZIER 10FR DISP (SUCTIONS) ×1 IMPLANT
SUT VIC AB 0 CT1 36 (SUTURE) ×1 IMPLANT
SUT VIC AB 2-0 CT1 27 (SUTURE) ×1
SUT VIC AB 2-0 CT1 TAPERPNT 27 (SUTURE) ×1 IMPLANT
SYR 30ML LL (SYRINGE) ×1 IMPLANT
TAPE MICROFOAM 4IN (TAPE) IMPLANT
TRAP FLUID SMOKE EVACUATOR (MISCELLANEOUS) ×1 IMPLANT
WATER STERILE IRR 500ML POUR (IV SOLUTION) ×1 IMPLANT

## 2023-05-18 NOTE — Op Note (Signed)
DATE OF SURGERY:  05/18/2023  TIME: 11:37 AM  PATIENT NAME:  [name]  AGE: 72 y.o.  PRE-OPERATIVE DIAGNOSIS: Displaced intertrochanteric fracture right hip    POST-OPERATIVE DIAGNOSIS:  SAME  PROCEDURE:  INTRAMEDULLARY (IM) NAIL INTERTROCHANTERIC right hip  SURGEON:  Valinda Hoar  ASST:  EBL: 25 cc  COMPLICATIONS: None  OPERATIVE IMPLANTS: Synthes trochanteric femoral nail 130 degrees / 11 mm with interlocking helical blade 105 mm and distal locking screw 40 mm.  PREOPERATIVE INDICATIONS:  [name] is a 72 y.o. year old who fell and suffered a hip fracture. He was brought into the ER and then admitted and optimized and then elected for surgical intervention.   On admission the patient had an infected ulceration over the greater trochanteric region of the right hip.  He also had sacral decubitus ulcers.  He was deemed inappropriate for operative fixation through this infected area until it would could be cleared up.  He has been kept on IV antibiotics for 11 days and the wounds have cleared up nicely and the induration and cellulitis is completely cleared up.  His lab work is entirely normal with no sign of infection.  Inspection of his skin indicates that it appears safe for surgery.  The risks benefits and alternatives were discussed with the patient including but not limited to the risks of nonoperative treatment, versus surgical intervention including infection, bleeding, nerve injury, malunion, nonunion, hardware prominence, hardware failure, need for hardware removal, blood clots, cardiopulmonary complications, morbidity, mortality, among others, and they were willing to proceed.    OPERATIVE PROCEDURE:  The patient was brought to the operating room and placed in the supine position.  General endotracheal anesthesia was administered, with a foley. He was placed on the fracture table.  Closed reduction was performed under C-arm guidance. The length of the femur was  also measured using fluoroscopy. Time out was then performed after sterile prep and drape. He received preoperative antibiotics.  Incision was made proximal to the greater trochanter. A guidewire was placed in the appropriate position. Confirmation was made on AP and lateral views. The above-named nail was opened. I opened the proximal femur with a reamer. I then placed the nail by hand easily down. I did not need to ream the femur.  Once the nail was completely seated, I placed a guidepin into the femoral head into the center center position through a second incision.  I measured the length, and then reamed the lateral cortex and up into the head. I then placed the helical blade. Slight compression was applied. Anatomic fixation achieved. Bone quality was mediocre.  I then secured the proximal interlock.  The distal locking screw was then placed and after confirming the position of the fracture fragments and hardware I then removed the instruments, and took final C-arm pictures AP and lateral the entire length of the leg. Anatomic reconstruction was achieved, and the wounds were irrigated copiously and closed with Vicryl  followed by staples and dry sterile dressing. Sponge and needle count were correct.   The patient was awakened and returned to PACU in stable and satisfactory condition. There no complications and the patient tolerated the procedure well.  He will be partial weightbearing as tolerated, and will be on Lovenox  For DVT prophylaxis.     Valinda Hoar, M.D.

## 2023-05-18 NOTE — H&P (Signed)
THE PATIENT WAS SEEN PRIOR TO SURGERY TODAY.  HISTORY, ALLERGIES, HOME MEDICATIONS AND OPERATIVE PROCEDURE WERE REVIEWED. RISKS AND BENEFITS OF SURGERY DISCUSSED WITH PATIENT AGAIN.  NO CHANGES FROM INITIAL HISTORY AND PHYSICAL NOTED.    

## 2023-05-18 NOTE — Progress Notes (Signed)
Prior to bedside shift report, surgery transport reported to this RN and night RN they walked into room to get patient and he was "chugging cola." Spoke with Guadalupe Dawn, RN who states she will message when it is time to start vanc; start closer to surgery. Per Dr. Hyacinth Meeker who is aware of incident, start vanc at 0900.    05/18/23 0715  Hand-off documentation  Hand-off Given Given to shift RN/LPN  Report given to (Full Name) Nathania Waldman rn  Hand-off Received Received from shift RN/LPN  Report received from (Full Name) Enrique Sack RN

## 2023-05-18 NOTE — Plan of Care (Signed)

## 2023-05-18 NOTE — Transfer of Care (Signed)
Immediate Anesthesia Transfer of Care Note  Patient: [name]  Procedure(s) Performed: INTRAMEDULLARY (IM) NAIL INTERTROCHANTERIC (Right)  Patient Location: PACU  Anesthesia Type:General  Level of Consciousness: awake, alert , and oriented  Airway & Oxygen Therapy: Patient Spontanous Breathing  Post-op Assessment: Report given to RN and Post -op Vital signs reviewed and stable  Post vital signs: Reviewed and stable  Last Vitals:  Vitals Value Taken Time  BP    Temp 36.2 C 05/18/23 1142  Pulse    Resp    SpO2      Last Pain:  Vitals:   05/18/23 0905  TempSrc: Temporal  PainSc:          Complications: No notable events documented.

## 2023-05-18 NOTE — Anesthesia Preprocedure Evaluation (Addendum)
Anesthesia Evaluation  Patient identified by MRN, date of birth, ID band Patient awake    Reviewed: Allergy & Precautions, H&P , NPO status , Patient's Chart, lab work & pertinent test results, reviewed documented beta blocker date and time   History of Anesthesia Complications Negative for: history of anesthetic complications  Airway Mallampati: II  TM Distance: >3 FB Neck ROM: full    Dental  (+) Dental Advidsory Given   Pulmonary neg shortness of breath, Continuous Positive Airway Pressure Ventilation neg sleep apnea, COPD, neg recent URI, Current Smoker   Pulmonary exam normal breath sounds clear to auscultation       Cardiovascular Exercise Tolerance: Good hypertension, (-) angina +CHF  (-) Past MI and (-) Cardiac Stents Normal cardiovascular exam(-) dysrhythmias (-) Valvular Problems/Murmurs Rhythm:regular Rate:Normal     Neuro/Psych  PSYCHIATRIC DISORDERS  Depression Bipolar Disorder Schizophrenia Dementia negative neurological ROS     GI/Hepatic Neg liver ROS,GERD  ,,  Endo/Other  negative endocrine ROS    Renal/GU negative Renal ROS  negative genitourinary   Musculoskeletal   Abdominal   Peds  Hematology negative hematology ROS (+)   Anesthesia Other Findings Past Medical History: No date: Bipolar disorder (HCC) No date: COPD (chronic obstructive pulmonary disease) (HCC) No date: Depression No date: GERD (gastroesophageal reflux disease) No date: GIB (gastrointestinal bleeding) No date: Hyperlipidemia No date: Hypertension No date: Mild dementia (HCC) No date: Paranoid schizophrenia (HCC) No date: Smoker No date: Tobacco dependence   Reproductive/Obstetrics negative OB ROS                             Anesthesia Physical Anesthesia Plan  ASA: 2  Anesthesia Plan: General   Post-op Pain Management:    Induction: Intravenous  PONV Risk Score and Plan: 1 and  Ondansetron, Dexamethasone and Treatment may vary due to age or medical condition  Airway Management Planned: LMA and Oral ETT  Additional Equipment:   Intra-op Plan:   Post-operative Plan: Extubation in OR  Informed Consent: I have reviewed the patients History and Physical, chart, labs and discussed the procedure including the risks, benefits and alternatives for the proposed anesthesia with the patient or authorized representative who has indicated his/her understanding and acceptance.     Dental Advisory Given  Plan Discussed with: Anesthesiologist, CRNA and Surgeon  Anesthesia Plan Comments: (Discussed anesthetic plan with Saburo Teaff (patient's nephew and decision maker) who agreed to the plan and had no questions at this time.)       Anesthesia Quick Evaluation

## 2023-05-18 NOTE — Plan of Care (Signed)

## 2023-05-18 NOTE — Anesthesia Procedure Notes (Signed)
Procedure Name: Intubation Date/Time: 05/18/2023 9:57 AM  Performed by: Jaye Beagle, CRNAPre-anesthesia Checklist: Patient identified, Emergency Drugs available, Suction available and Patient being monitored Patient Re-evaluated:Patient Re-evaluated prior to induction Oxygen Delivery Method: Circle system utilized Preoxygenation: Pre-oxygenation with 100% oxygen Induction Type: IV induction Ventilation: Mask ventilation without difficulty Laryngoscope Size: McGraph and 4 Grade View: Grade I Tube type: Oral Tube size: 7.5 mm Number of attempts: 1 Airway Equipment and Method: Stylet and Oral airway Placement Confirmation: ETT inserted through vocal cords under direct vision, positive ETCO2 and breath sounds checked- equal and bilateral Secured at: 23 cm Tube secured with: Tape Dental Injury: Teeth and Oropharynx as per pre-operative assessment

## 2023-05-18 NOTE — Progress Notes (Signed)
Progress Note    Shion Stifel  HQI:[id number] DOB: [birth year]  DOA: 05/06/2023 PCP: Koren Bound, NP      Brief Narrative:    Medical records reviewed and are as summarized below:  Arnold Decoteau is a 72 y.o. male with medical history significant of dementia, bipolar, schizophrenia, hypertension, hyperlipidemia, COPD, diastolic CHF, stroke, hypothyroidism, UTI-ESBL, who presented to the hospital with general weakness.reportedly, he was found sitting on the couch covered in urine-soaked blanket.  According to Mr. Corbo (nephew and decision maker), patient had fallen about 4 months ago at the [facility] when he  was trying to get an  ID.  He sustained some lacerations to the forehead because he fell on his face, and he was hospitalized at [place].  He was released to a rehab facility where he spent about 2 weeks.  He said he did not complete rehab because his insurance will not pay for him to stay any longer.  He was then released to the group home.  He said he has been feeling weak since.    Assessment/Plan:   Principal Problem:   Closed displaced intertrochanteric fracture of right femur (HCC) Active Problems:   COVID-19 virus infection   COPD (chronic obstructive pulmonary disease) (HCC)   Cellulitis of right hip   Essential hypertension   Hyperlipidemia   Chronic diastolic CHF (congestive heart failure) (HCC)   Hyperthyroidism   Bipolar 1 disorder (HCC)   Schizophrenia (HCC)   BPH (benign prostatic hyperplasia)   Smoker   Protein-calorie malnutrition, severe   Pressure injury of skin   Body mass index is 23.41 kg/m.   Comminuted right hip fracture (intertrochanteric proximal right femur):  S/p intramedullary nail intertrochanteric right hip on 05/18/2023.  Analgesia as needed for pain.  Follow-up with orthopedic surgeon.  PT and OT evaluation.   Right hip cellulitis: Improved.  Discontinue IV cefazolin.   UTI has been ruled out.  No growth on urine culture.     COVID-19 infection: No respiratory symptoms or hypoxia.  Positive SARS coronavirus test on 05/06/2023.    COPD: Compensated.  Continue bronchodilators   Chronic diastolic CHF: Compensated.  2D echo in August 2024 showed EF estimated at 65%, grade 1 diastolic dysfunction.   Other comorbidities include bipolar disorder, schizophrenia, hyperthyroidism, BPH, hyperlipidemia, hypertension    Diet Order             Diet NPO time specified  Diet effective midnight                            Consultants: Orthopedic surgeon  Procedures: S/p intramedullary nail intertrochanteric right hip on 05/18/2023    Medications:    vitamin C  500 mg Oral BID   cholecalciferol  1,000 Units Oral Daily   citalopram  40 mg Oral Daily   feeding supplement  237 mL Oral TID BM   Gerhardt's butt cream   Topical TID   leptospermum manuka honey  1 Application Topical Daily   methimazole  5 mg Oral TID   multivitamin with minerals  1 tablet Oral Daily   nicotine  21 mg Transdermal Daily   mouth rinse  15 mL Mouth Rinse 4 times per day   risperiDONE  1 mg Oral Daily   simvastatin  20 mg Oral Daily   sodium chloride flush  10-40 mL Intracatheter Q12H   tamsulosin  0.4 mg Oral QPC supper   zinc sulfate  220  mg Oral Daily   Continuous Infusions:   ceFAZolin (ANCEF) IV 1 g (05/18/23 0322)     Anti-infectives (From admission, onward)    Start     Dose/Rate Route Frequency Ordered Stop   05/18/23 1054  sodium chloride irrigation 0.9 % 1,000 mL with gentamicin (GARAMYCIN) 80 mg  Status:  Discontinued          As needed 05/18/23 1055 05/18/23 1135   05/18/23 0730  vancomycin (VANCOREADY) IVPB 1500 mg/300 mL  Status:  Discontinued        1,500 mg 150 mL/hr over 120 Minutes Intravenous  Once 05/17/23 1046 05/17/23 1046   05/18/23 0700  vancomycin (VANCOREADY) IVPB 1500 mg/300 mL        1,500 mg 150 mL/hr over 120 Minutes Intravenous  Once 05/17/23 1046 05/18/23 1056   05/15/23  1900  ceFAZolin (ANCEF) IVPB 1 g/50 mL premix        1 g 100 mL/hr over 30 Minutes Intravenous Every 8 hours 05/15/23 1804     05/13/23 1400  ceFAZolin (ANCEF) IVPB 1 g/50 mL premix  Status:  Discontinued        1 g 100 mL/hr over 30 Minutes Intravenous Every 8 hours 05/13/23 1032 05/15/23 1804   05/08/23 1600  cefTRIAXone (ROCEPHIN) 1 g in sodium chloride 0.9 % 100 mL IVPB  Status:  Discontinued        1 g 200 mL/hr over 30 Minutes Intravenous Every 24 hours 05/08/23 1510 05/13/23 1032   05/07/23 2000  cefTRIAXone (ROCEPHIN) 1 g in sodium chloride 0.9 % 100 mL IVPB  Status:  Discontinued        1 g 200 mL/hr over 30 Minutes Intravenous Every 24 hours 05/06/23 2125 05/06/23 2125   05/06/23 2145  meropenem (MERREM) 1 g in sodium chloride 0.9 % 100 mL IVPB  Status:  Discontinued        1 g 200 mL/hr over 30 Minutes Intravenous Every 8 hours 05/06/23 2137 05/08/23 0803   05/06/23 2045  cefTRIAXone (ROCEPHIN) 1 g in sodium chloride 0.9 % 100 mL IVPB        1 g 200 mL/hr over 30 Minutes Intravenous  Once 05/06/23 2034 05/06/23 2127              Family Communication/Anticipated D/C date and plan/Code Status   DVT prophylaxis:      Code Status: Full Code  Family Communication: None Disposition Plan: Plan to discharge to SNF  Status is: Inpatient Remains inpatient appropriate because: Right hip fracture s/p right hip surgery         Subjective:   Interval eval events noted.  He has no complaints.  He had surgery this morning but he does not even remember that he had surgery.  Objective:    Vitals:   05/18/23 1142 05/18/23 1145 05/18/23 1200 05/18/23 1221  BP: 132/61 134/66 (!) 143/69 135/63  Pulse: 85 90 92 91  Resp:  16 18 17   Temp: (!) 97.2 F (36.2 C)  (!) 97.3 F (36.3 C) 98.3 F (36.8 C)  TempSrc:      SpO2: 99% 100% 95% 94%  Weight:      Height:       No data found.   Intake/Output Summary (Last 24 hours) at 05/18/2023 1235 Last data filed at  05/18/2023 1113 Gross per 24 hour  Intake 150 ml  Output 225 ml  Net -75 ml   Filed Weights   05/17/23 0500 05/18/23 0500  05/18/23 0905  Weight: 71.9 kg 71.9 kg 71.9 kg    Exam:  GEN: NAD SKIN: Warm and dry EYES: No pallor or icterus ENT: MMM CV: RRR PULM: CTA B ABD: soft, ND, NT, +BS CNS: AAO x 1 (person), non focal EXT: Dressing on right hip surgical wound is clean, dry and intact.  Chronic overgrown and dystrophic toe nails     Data Reviewed:   I have personally reviewed following labs and imaging studies:  Labs: Labs show the following:   Basic Metabolic Panel: Recent Labs  Lab 05/17/23 0446  NA 134*  K 4.0  CL 100  CO2 26  GLUCOSE 91  BUN 16  CREATININE 0.67  CALCIUM 8.9    GFR Estimated Creatinine Clearance: 83.5 mL/min (by C-G formula based on SCr of 0.67 mg/dL). Liver Function Tests: Recent Labs  Lab 05/17/23 0446  AST 20  ALT 15  ALKPHOS 120  BILITOT 0.9  PROT 7.9  ALBUMIN 2.5*    No results for input(s): "LIPASE", "AMYLASE" in the last 168 hours. No results for input(s): "AMMONIA" in the last 168 hours. Coagulation profile Recent Labs  Lab 05/17/23 0446  INR 1.2     CBC: Recent Labs  Lab 05/17/23 0446  WBC 7.5  NEUTROABS 5.3  HGB 10.0*  HCT 31.4*  MCV 89.0  PLT 286    Cardiac Enzymes: No results for input(s): "CKTOTAL", "CKMB", "CKMBINDEX", "TROPONINI" in the last 168 hours. BNP (last 3 results) No results for input(s): "PROBNP" in the last 8760 hours. CBG: No results for input(s): "GLUCAP" in the last 168 hours. D-Dimer: No results for input(s): "DDIMER" in the last 72 hours. Hgb A1c: No results for input(s): "HGBA1C" in the last 72 hours. Lipid Profile: No results for input(s): "CHOL", "HDL", "LDLCALC", "TRIG", "CHOLHDL", "LDLDIRECT" in the last 72 hours. Thyroid function studies: No results for input(s): "TSH", "T4TOTAL", "T3FREE", "THYROIDAB" in the last 72 hours.  Invalid input(s): "FREET3" Anemia work  up: No results for input(s): "VITAMINB12", "FOLATE", "FERRITIN", "TIBC", "IRON", "RETICCTPCT" in the last 72 hours. Sepsis Labs: Recent Labs  Lab 05/17/23 0446  WBC 7.5     Microbiology No results found for this or any previous visit (from the past 240 hour(s)).   Procedures and diagnostic studies:  DG C-Arm 1-60 Min-No Report  Result Date: 05/18/2023 Fluoroscopy was utilized by the requesting physician.  No radiographic interpretation.   DG C-Arm 1-60 Min-No Report  Result Date: 05/18/2023 Fluoroscopy was utilized by the requesting physician.  No radiographic interpretation.               LOS: 10 days   Laveyah Oriol  [facility]   Pager on [email or web address]. If 7PM-7AM, please contact night-coverage at [email or web address]     05/18/2023, 12:35 PM

## 2023-05-19 ENCOUNTER — Encounter: Payer: Self-pay | Admitting: Specialist

## 2023-05-19 DIAGNOSIS — J449 Chronic obstructive pulmonary disease, unspecified: Secondary | ICD-10-CM | POA: Diagnosis not present

## 2023-05-19 DIAGNOSIS — L03115 Cellulitis of right lower limb: Secondary | ICD-10-CM | POA: Diagnosis not present

## 2023-05-19 DIAGNOSIS — I10 Essential (primary) hypertension: Secondary | ICD-10-CM | POA: Diagnosis not present

## 2023-05-19 DIAGNOSIS — S72001A Fracture of unspecified part of neck of right femur, initial encounter for closed fracture: Secondary | ICD-10-CM | POA: Diagnosis not present

## 2023-05-19 LAB — BASIC METABOLIC PANEL
Anion gap: 6 (ref 5–15)
BUN: 17 mg/dL (ref 8–23)
CO2: 25 mmol/L (ref 22–32)
Calcium: 8.7 mg/dL — ABNORMAL LOW (ref 8.9–10.3)
Chloride: 99 mmol/L (ref 98–111)
Creatinine, Ser: 0.59 mg/dL — ABNORMAL LOW (ref 0.61–1.24)
GFR, Estimated: >60 mL/min (ref >60)
Glucose, Bld: 101 mg/dL — ABNORMAL HIGH (ref 70–99)
Potassium: 4.2 mmol/L (ref 3.5–5.1)
Sodium: 130 mmol/L — ABNORMAL LOW (ref 135–145)

## 2023-05-19 LAB — CBC
HCT: 30.4 % — ABNORMAL LOW (ref 39.0–52.0)
Hemoglobin: 9.8 g/dL — ABNORMAL LOW (ref 13.0–17.0)
MCH: 28.1 pg (ref 26.0–34.0)
MCHC: 32.2 g/dL (ref 30.0–36.0)
MCV: 87.1 fL (ref 80.0–100.0)
Platelets: 279 10*3/uL (ref 150–400)
RBC: 3.49 MIL/uL — ABNORMAL LOW (ref 4.22–5.81)
RDW: 14 % (ref 11.5–15.5)
WBC: 11 10*3/uL — ABNORMAL HIGH (ref 4.0–10.5)
nRBC: 0 % (ref 0.0–0.2)

## 2023-05-19 MED ORDER — DOXYCYCLINE HYCLATE 100 MG PO TABS
100.0000 mg | ORAL_TABLET | Freq: Two times a day (BID) | ORAL | Status: DC
  Administered 2023-05-19 – 2023-05-24 (×12): 100 mg via ORAL
  Filled 2023-05-19 (×13): qty 1

## 2023-05-19 NOTE — Progress Notes (Signed)
Subjective: 1 Day Post-Op Procedure(s) (LRB): INTRAMEDULLARY (IM) NAIL INTERTROCHANTERIC (Right) Patient is alert and appears comfortable.  He has been out of bed in a chair.  Apparently doing quite a bit better.  White blood count 11,000 probably in reaction to surgery.  Hemoglobin stable at 9.8.  Dressing is dry. Putting him on doxycycline 100 mg twice daily for 5 days as further infection prophylaxis. He has started PT.  Patient reports pain as mild.  Objective:   VITALS:   Vitals:   05/18/23 2312 05/19/23 0833  BP: (!) 140/77 128/79  Pulse: (!) 105 (!) 101  Resp: 20 15  Temp: 98.8 F (37.1 C) 99 F (37.2 C)  SpO2: 97% 96%    Neurologically intact Dorsiflexion/Plantar flexion intact Incision: dressing C/D/I  LABS Recent Labs    05/17/23 0446 05/19/23 0511  HGB 10.0* 9.8*  HCT 31.4* 30.4*  WBC 7.5 11.0*  PLT 286 279    Recent Labs    05/17/23 0446 05/19/23 0511  NA 134* 130*  K 4.0 4.2  BUN 16 17  CREATININE 0.67 0.59*  GLUCOSE 91 101*    Recent Labs    05/17/23 0446  INR 1.2     Assessment/Plan: 1 Day Post-Op Procedure(s) (LRB): INTRAMEDULLARY (IM) NAIL INTERTROCHANTERIC (Right)   Advance diet Up with therapy Discharge to SNF when available. Will discharge home on 81 mg ASA twice daily. Will follow-up in my office 2 weeks after discharge. Doxycycline 100 mg twice daily x 5 days.

## 2023-05-19 NOTE — Progress Notes (Signed)
Nutrition Follow-up  DOCUMENTATION CODES:   Severe malnutrition in context of chronic illness  INTERVENTION:   -Downgrade diet to dysphagia 3 for ease of intake -Continue feeding assistance with meals -Continue Ensure Enlive po TID, each supplement provides 350 kcal and 20 grams of protein -Continue MVI with minerals daily -Continue 500 mg vitamin C BID -Continue 220 mg zinc sulfate daily x 14 days -Continue Magic cup TID with meals, each supplement provides 290 kcal and 9 grams of protein   NUTRITION DIAGNOSIS:   Severe Malnutrition related to chronic illness (dementia) as evidenced by moderate fat depletion, severe fat depletion, moderate muscle depletion, severe muscle depletion, percent weight loss.  Ongoing  GOAL:   Patient will meet greater than or equal to 90% of their needs  Progressing   MONITOR:   PO intake, Supplement acceptance  REASON FOR ASSESSMENT:   Malnutrition Screening Tool    ASSESSMENT:   Pt with medical history significant of dementia, bipolar, schizophrenia, hypertension, hyperlipidemia, COPD, diastolic CHF, stroke, hypothyroidism, UTI-ESBL, who presents with weakness.  10/19- per ortho pt with rt intertochanteric hip fracture with overlying soft tissue cellulitis and infection  10/29- s/p INTRAMEDULLARY (IM) NAIL INTERTROCHANTERIC right hip   Reviewed I/O's: +629 ml x 24 hours and -6.6 L since admission  UOP: 100 ml x 24 hours   Per [facility] notes, pt with stage 3 pressure injury to rt buttock, MASD to sacrum/ coccyx/ buttocks, and full thickness wound to rt hip.   Pt just finished working with PT. Per PT, pt refused any more food off of meal tray. Pt consumed a banana and Ensure this morning.   Pt sitting up in recliner chair at time of visit. He reports feeling well today. Pt says he is not hungry any more and did not want any other items off meal tray. Offer to help feed to open packages for pt, but he refused. RD provided pt with a  gingerale per request.   Discussed importance of good meal and supplement intake to promote healing. Pt amenable to continue supplements.   Medications reviewed and include vitamin C, vitamin D3, ferrous sulfate, risperidal, senna, and zinc sulfate.   Labs reviewed: Na: 130, CBGS: 90.   Diet Order:   Diet Order             Diet regular Room service appropriate? Yes; Fluid consistency: Thin  Diet effective now                   EDUCATION NEEDS:   No education needs have been identified at this time  Skin:  Skin Assessment: Skin Integrity Issues: Skin Integrity Issues:: Other (Comment), Stage III Stage II: - Stage III: rt buttock Other: full thickness wound to rt hip, MASD to sacrum/ coccyx/ buttocks  Last BM:  05/17/23  Height:   Ht Readings from Last 1 Encounters:  05/18/23 5\' 9"  (1.753 m)    Weight:   Wt Readings from Last 1 Encounters:  05/19/23 73.6 kg    Ideal Body Weight:  72.7 kg  BMI:  Body mass index is 23.96 kg/m.  Estimated Nutritional Needs:   Kcal:  2200-2300  Protein:  110-125 grams  Fluid:  > 2 L    Levada Schilling, RD, LDN, CDCES Registered Dietitian III Certified Diabetes Care and Education Specialist Please refer to [facility] for RD and/or RD on-call/weekend/after hours pager

## 2023-05-19 NOTE — Evaluation (Signed)
Physical Therapy Evaluation Patient Details Name: [name] MRN: [id number] DOB: 1950-11-26 Today's Date: 05/19/2023  History of Present Illness  Patient is a 72 year old male from group home with unwitnessed fall with possible syncopal episode, striking his head. History of hypertension, hyperlipidemia, COPD, bipolar disorder, and schizophrenia. MRI of brain with no acute intracranial pathology. Patient is s/p R hip ORIF. PWB ( 50%)   Clinical Impression  Patient received in bed, he is alert, able to tell me his name. Patient has garbled speech and is sometimes difficult to understand. Patient able to get to edge of bed with min A ( for moving R LE). He is educated on PWB status but just a couple of minutes later is unable to state WB limitations. Requires repeated cues for WB status during mobility and I am still unsure of how much weight he is putting on R LE. Patient was able to stand with max +1-2 from elevated bed and take a few pivoting steps to recliner. Patient will continue to benefit from skilled PT to improve functional mobility.           If plan is discharge home, recommend the following: Two people to help with walking and/or transfers;A lot of help with bathing/dressing/bathroom   Can travel by private vehicle   No    Equipment Recommendations Rolling walker (2 wheels)  Recommendations for Other Services       Functional Status Assessment Patient has had a recent decline in their functional status and demonstrates the ability to make significant improvements in function in a reasonable and predictable amount of time.     Precautions / Restrictions Precautions Precautions: Fall Restrictions Weight Bearing Restrictions: Yes RLE Weight Bearing: Partial weight bearing RLE Partial Weight Bearing Percentage or Pounds: 50%      Mobility  Bed Mobility Overal bed mobility: Needs Assistance Bed Mobility: Supine to Sit     Supine to sit: Min assist     General  bed mobility comments: min A to bring R LE off bed    Transfers Overall transfer level: Needs assistance Equipment used: Rolling walker (2 wheels) Transfers: Sit to/from Stand, Bed to chair/wheelchair/BSC Sit to Stand: Max assist, +2 physical assistance, From elevated surface   Step pivot transfers: Max assist, +2 physical assistance       General transfer comment: patient requires max +2 for safety with standing and transfer. Does not appear that he is able to maintain 50% WB on right LE despite cues throughout session. Poor STM for retaining WB status as well.    Ambulation/Gait               General Gait Details: not attempted  Stairs            Wheelchair Mobility     Tilt Bed    Modified Rankin (Stroke Patients Only)       Balance Overall balance assessment: Needs assistance Sitting-balance support: Feet supported Sitting balance-Leahy Scale: Good     Standing balance support: Bilateral upper extremity supported, During functional activity, Reliant on assistive device for balance Standing balance-Leahy Scale: Poor Standing balance comment: +2                             Pertinent Vitals/Pain Pain Assessment Breathing: normal Negative Vocalization: none Facial Expression: smiling or inexpressive Body Language: relaxed Consolability: no need to console PAINAD Score: 0    Home Living Family/patient expects to be  discharged to:: Skilled nursing facility Living Arrangements: Group Home                 Additional Comments: Unable to obtain home info from patient    Prior Function Prior Level of Function : Patient poor historian/Family not available             Mobility Comments: patient reports he uses a walker at times and has assist at times with mobility. ADLs Comments: Pt reports being Ind in self care tasks     Extremity/Trunk Assessment   Upper Extremity Assessment Upper Extremity Assessment: Defer to OT  evaluation    Lower Extremity Assessment Lower Extremity Assessment: Generalized weakness    Cervical / Trunk Assessment Cervical / Trunk Assessment: Normal  Communication   Communication Communication: Difficulty communicating thoughts/reduced clarity of speech Cueing Techniques: Verbal cues;Gestural cues  Cognition Arousal: Alert Behavior During Therapy: WFL for tasks assessed/performed Overall Cognitive Status: No family/caregiver present to determine baseline cognitive functioning                                          General Comments      Exercises     Assessment/Plan    PT Assessment Patient needs continued PT services  PT Problem List Decreased activity tolerance;Decreased balance;Decreased mobility;Decreased strength;Decreased coordination;Decreased cognition;Decreased knowledge of use of DME;Decreased knowledge of precautions;Decreased safety awareness;Pain       PT Treatment Interventions DME instruction;Gait training;Functional mobility training;Therapeutic activities;Therapeutic exercise;Balance training;Neuromuscular re-education;Cognitive remediation;Patient/family education    PT Goals (Current goals can be found in the Care Plan section)  Acute Rehab PT Goals Patient Stated Goal: none stated PT Goal Formulation: Patient unable to participate in goal setting Time For Goal Achievement: 06/02/23    Frequency Min 1X/week     Co-evaluation               AM-PAC PT "6 Clicks" Mobility  Outcome Measure Help needed turning from your back to your side while in a flat bed without using bedrails?: A Little Help needed moving from lying on your back to sitting on the side of a flat bed without using bedrails?: A Little Help needed moving to and from a bed to a chair (including a wheelchair)?: A Lot Help needed standing up from a chair using your arms (e.g., wheelchair or bedside chair)?: A Lot Help needed to walk in hospital room?:  Total Help needed climbing 3-5 steps with a railing? : Total 6 Click Score: 12    End of Session Equipment Utilized During Treatment: Gait belt Activity Tolerance: Patient tolerated treatment well Patient left: in chair;with call bell/phone within reach;with chair alarm set Nurse Communication: Mobility status PT Visit Diagnosis: Unsteadiness on feet (R26.81);Other abnormalities of gait and mobility (R26.89);History of falling (Z91.81);Difficulty in walking, not elsewhere classified (R26.2);Muscle weakness (generalized) (M62.81);Pain Pain - Right/Left: Right Pain - part of body: Hip    Time: 7425-9563 PT Time Calculation (min) (ACUTE ONLY): 19 min   Charges:   PT Evaluation $PT Re-evaluation: 1 Re-eval PT Treatments $Therapeutic Activity: 8-22 mins PT General Charges $$ ACUTE PT VISIT: 1 Visit         Felis Quillin, PT, GCS 05/19/23,10:15 AM

## 2023-05-19 NOTE — Progress Notes (Signed)
[facility]  - [place] [facility]   PATIENT NAME: [name]    MR#:  [id number]  DATE OF BIRTH:  [birth year]  SUBJECTIVE:  patient postop day one hip surgery. Sitting up in the recliner. No family at bedside. Overall doing well. No issues per RN.    VITALS:  Blood pressure 128/79, pulse (!) 101, temperature 99 F (37.2 C), resp. rate 15, height 5\' 9"  (1.753 m), weight 73.6 kg, SpO2 96%.  PHYSICAL EXAMINATION:   GENERAL:  72 y.o.-year-old patient with no acute distress.  LUNGS: Normal breath sounds bilaterally, no wheezing CARDIOVASCULAR: S1, S2 normal. No murmur   ABDOMEN: Soft, nontender, nondistended. Bowel sounds present.  EXTREMITIES: surgical site dressing present    NEUROLOGIC: nonfocal  patient is alert and awake  LABORATORY PANEL:  CBC Recent Labs  Lab 05/19/23 0511  WBC 11.0*  HGB 9.8*  HCT 30.4*  PLT 279    Chemistries  Recent Labs  Lab 05/17/23 0446 05/19/23 0511  NA 134* 130*  K 4.0 4.2  CL 100 99  CO2 26 25  GLUCOSE 91 101*  BUN 16 17  CREATININE 0.67 0.59*  CALCIUM 8.9 8.7*  AST 20  --   ALT 15  --   ALKPHOS 120  --   BILITOT 0.9  --    Cardiac Enzymes No results for input(s): "TROPONINI" in the last 168 hours. RADIOLOGY:  DG HIP UNILAT WITH PELVIS 2-3 VIEWS RIGHT  Result Date: 05/18/2023 CLINICAL DATA:  Right femoral intramedullary nail surgery. Intraoperative fluoroscopy. EXAM: DG HIP (WITH OR WITHOUT PELVIS) 2-3V RIGHT COMPARISON:  CT right hip 05/07/2023 FINDINGS: Images were performed intraoperatively without the presence of a radiologist. On the first two images, intertrochanteric fracture of the proximal femur is again visualized. The patient is undergoing placement of a right cephalomedullary nail fixating the fracture. Total fluoroscopy images: 5 Total fluoroscopy time: 73 seconds Total dose: Radiation Exposure Index (as provided by the fluoroscopic device): 12.9 mGy air Kerma Please see intraoperative findings  for further detail. IMPRESSION: Intraoperative fluoroscopy for right femoral intramedullary nail surgery. Electronically Signed   By: Neita Garnet M.D.   On: 05/18/2023 13:18   DG C-Arm 1-60 Min-No Report  Result Date: 05/18/2023 Fluoroscopy was utilized by the requesting physician.  No radiographic interpretation.   DG C-Arm 1-60 Min-No Report  Result Date: 05/18/2023 Fluoroscopy was utilized by the requesting physician.  No radiographic interpretation.    Assessment and Plan   [name] is a 72 y.o. male with medical history significant of dementia, bipolar, schizophrenia, hypertension, hyperlipidemia, COPD, diastolic CHF, stroke, hypothyroidism, UTI-ESBL, who presented to the hospital with general weakness.reportedly, he was found sitting on the couch covered in urine-soaked blanket.    Comminuted right hip fracture (intertrochanteric proximal right femur): -- Patient has been evaluated by Dr. Hyacinth Meeker, orthopedic surgeon.   --He wants to delay surgery until patient has had adequate antibiotics for right hip cellulitis.   --Continue Lovenox for DVT prophylaxis. --Follow-up with Dr. Hyacinth Meeker, orthopedic surgeon. --10/22--Dr Audelia Acton to see pt--for second opinion per Timmy Brickell request--appreciate input --10/23-- Dr Hyacinth Meeker plans to do surgery next Tuesday. --10/24--skin wound looks better today. Changed to IV cefazolin after d/w ID RPH. Cont abxs till ortho surgery is completed next Tuesday --10/30--assumed care POD #1 sitting in the chair. Doing well   Right hip cellulitis:  -- Analgesics as needed for pain. --evaluated by WOC--appears to be improved    COVID-19 infection: No respiratory symptoms or hypoxia.  Continue airborne and contact precautions.    COPD: Compensated.  Continue bronchodilators   Chronic diastolic CHF: Compensated.  2D echo in August 2024 showed EF estimated at 65%, grade 1 diastolic dysfunction.    Hyperthyroidism -Continue home methimazole   Bipolar  1 disorder (HCC) and schizophrenia (HCC): Patient is calm currently -Celexa, risperidone -Patient is not taking fluphenazine currently   BPH (benign prostatic hyperplasia) -Flomax   Procedures:INTRAMEDULLARY (IM) NAIL INTERTROCHANTERIC right hip  SURGEON:  Valinda Hoar Family communication :none today Consults :ortho CODE STATUS: full DVT Prophylaxis :lovenox  Status is: Inpatient Remains inpatient appropriate because: TOC for d/c plans    TOTAL TIME TAKING CARE OF THIS PATIENT: 35 minutes.  >50% time spent on counselling and coordination of care  Note: This dictation was prepared with Dragon dictation along with smaller phrase technology. Any transcriptional errors that result from this process are unintentional.  Enedina Finner M.D    [facility]   CC: Primary care physician; Koren Bound, NP

## 2023-05-19 NOTE — Evaluation (Signed)
Occupational Therapy Evaluation Patient Details Name: [name] MRN: [id number] DOB: 06-10-1951 Today's Date: 05/19/2023   History of Present Illness Patient is a 72 year old male from group home with unwitnessed fall with possible syncopal episode, striking his head. History of hypertension, hyperlipidemia, COPD, bipolar disorder, and schizophrenia. MRI of brain with no acute intracranial pathology. Patient is s/p R hip ORIF. PWB ( 50%)   Clinical Impression   Chart reviewed, pt greeted in room, oriented to self and place, increased time required for processing. Pt is pleasant and cooperative throughout. Pt is a poor historian, per chart lived in a group home. Will need to confirm recent PLOF. Pt presents with deficits in strength, endurance, activity tolerance, balance, cognition affecting safe and optimal ADL completion. MOD-MAX A +1-2 required for STS, MOD-MAX A for step pivot from bed>chair with step by step multi modal cues for weight bearing adherence with poor carry over noted throughout. Supervision required for grooming tasks, MAX A for LB dressing. Pt will benefit from skilled OT to address deficits and to facilitate optimal ADL completion. Pt left in bed, all needs met, NAD. OT will follow acutely.       If plan is discharge home, recommend the following: Two people to help with walking and/or transfers;A lot of help with bathing/dressing/bathroom;Assistance with cooking/housework;Direct supervision/assist for medications management;Assist for transportation;Supervision due to cognitive status;Help with stairs or ramp for entrance;Direct supervision/assist for financial management    Functional Status Assessment  Patient has had a recent decline in their functional status and demonstrates the ability to make significant improvements in function in a reasonable and predictable amount of time.  Equipment Recommendations  Other (comment) (defer to next venue of care)     Recommendations for Other Services       Precautions / Restrictions Precautions Precautions: Fall Restrictions Weight Bearing Restrictions: Yes RLE Weight Bearing: Partial weight bearing RLE Partial Weight Bearing Percentage or Pounds: 50%      Mobility Bed Mobility Overal bed mobility: Needs Assistance Bed Mobility: Sit to Supine     Supine to sit: Max assist, HOB elevated, Used rails     General bed mobility comments: assist for BLE    Transfers Overall transfer level: Needs assistance Equipment used: Rolling walker (2 wheels) Transfers: Sit to/from Stand Sit to Stand: Mod assist, Max assist, +2 physical assistance           General transfer comment: MAX A+1 for first STS attempt, MOD-MAX A +2 for second attempt      Balance Overall balance assessment: Needs assistance Sitting-balance support: Feet supported Sitting balance-Leahy Scale: Good     Standing balance support: Bilateral upper extremity supported, During functional activity, Reliant on assistive device for balance Standing balance-Leahy Scale: Poor                             ADL either performed or assessed with clinical judgement   ADL Overall ADL's : Needs assistance/impaired     Grooming: Wash/dry face;Sitting;Supervision/safety               Lower Body Dressing: Maximal assistance;Sitting/lateral leans Lower Body Dressing Details (indicate cue type and reason): socks in sitting Toilet Transfer: Moderate assistance;Maximal assistance;Rolling walker (2 wheels);+2 for physical assistance Toilet Transfer Details (indicate cue type and reason): simulated step pivot from bedside chair to bed with step by step multi modal cues for WBing adherence Toileting- Clothing Manipulation and Hygiene: Maximal assistance;Sit to/from stand  Vision Patient Visual Report: No change from baseline Additional Comments: pt reports no changes, will continue to assess      Perception         Praxis         Pertinent Vitals/Pain Pain Assessment Pain Assessment: Faces Faces Pain Scale: Hurts a little bit Pain Location: R hip Pain Descriptors / Indicators: Discomfort Pain Intervention(s): Monitored during session, Repositioned     Extremity/Trunk Assessment Upper Extremity Assessment Upper Extremity Assessment: Generalized weakness   Lower Extremity Assessment Lower Extremity Assessment: Generalized weakness   Cervical / Trunk Assessment Cervical / Trunk Assessment: Normal   Communication Communication Communication: Difficulty following commands/understanding Following commands: Follows one step commands with increased time Cueing Techniques: Verbal cues;Tactile cues;Visual cues;Gestural cues   Cognition Arousal: Alert Behavior During Therapy: Flat affect Overall Cognitive Status: No family/caregiver present to determine baseline cognitive functioning Area of Impairment: Orientation, Attention, Memory, Following commands, Safety/judgement, Awareness                 Orientation Level: Disoriented to, Date, Situation Current Attention Level: Sustained Memory: Decreased short-term memory, Decreased recall of precautions Following Commands: Follows one step commands with increased time Safety/Judgement: Decreased awareness of deficits, Decreased awareness of safety Awareness: Intellectual   General Comments: step by step multi modal cues for adherence to PWBing     General Comments  HR 109 bpm, spo2 98% on RA after return to bed    Exercises Other Exercises Other Exercises: edu re: role of OT, role of rehab, weight bearing restrictions, safe ADL completion   Shoulder Instructions      Home Living Family/patient expects to be discharged to:: Skilled nursing facility Living Arrangements: Group Home                               Additional Comments: pt is a poor historian, will need to confirm PLOF      Prior  Functioning/Environment Prior Level of Function : Patient poor historian/Family not available;History of Falls (last six months)             Mobility Comments: pt endorses he uses a RW ADLs Comments: pt reports he is indep in ADL/IADL, however lives in a group home, ?historian        OT Problem List: Decreased strength;Decreased activity tolerance;Impaired balance (sitting and/or standing);Decreased cognition;Decreased range of motion;Decreased safety awareness;Decreased coordination;Decreased knowledge of use of DME or AE;Impaired UE functional use      OT Treatment/Interventions: Self-care/ADL training;Therapeutic exercise;DME and/or AE instruction;Therapeutic activities;Neuromuscular education;Patient/family education;Balance training    OT Goals(Current goals can be found in the care plan section) Acute Rehab OT Goals Patient Stated Goal: get back to bed OT Goal Formulation: With patient Time For Goal Achievement: 06/02/23 Potential to Achieve Goals: Good ADL Goals Pt Will Perform Lower Body Dressing: with min assist;sitting/lateral leans Pt Will Transfer to Toilet: with supervision;ambulating Pt Will Perform Toileting - Clothing Manipulation and hygiene: with min assist;sit to/from stand  OT Frequency: Min 1X/week    Co-evaluation              AM-PAC OT "6 Clicks" Daily Activity     Outcome Measure Help from another person eating meals?: A Little Help from another person taking care of personal grooming?: A Little Help from another person toileting, which includes using toliet, bedpan, or urinal?: A Lot Help from another person bathing (including washing, rinsing, drying)?: A Lot Help from another person  to put on and taking off regular upper body clothing?: A Little Help from another person to put on and taking off regular lower body clothing?: A Lot 6 Click Score: 15   End of Session Equipment Utilized During Treatment: Gait belt;Rolling walker (2 wheels) Nurse  Communication: Mobility status  Activity Tolerance: Patient tolerated treatment well Patient left: in bed;with call bell/phone within reach;with bed alarm set  OT Visit Diagnosis: Unsteadiness on feet (R26.81);Muscle weakness (generalized) (M62.81)                Time: 1610-9604 OT Time Calculation (min): 22 min Charges:  OT General Charges $OT Visit: 1 Visit OT Evaluation $OT Eval Moderate Complexity: 1 Mod  Oleta Mouse, OTD OTR/L  05/19/23, 10:57 AM

## 2023-05-20 DIAGNOSIS — I10 Essential (primary) hypertension: Secondary | ICD-10-CM | POA: Diagnosis not present

## 2023-05-20 DIAGNOSIS — L03115 Cellulitis of right lower limb: Secondary | ICD-10-CM | POA: Diagnosis not present

## 2023-05-20 DIAGNOSIS — J449 Chronic obstructive pulmonary disease, unspecified: Secondary | ICD-10-CM | POA: Diagnosis not present

## 2023-05-20 DIAGNOSIS — S72001A Fracture of unspecified part of neck of right femur, initial encounter for closed fracture: Secondary | ICD-10-CM | POA: Diagnosis not present

## 2023-05-20 LAB — BASIC METABOLIC PANEL
Anion gap: 6 (ref 5–15)
BUN: 20 mg/dL (ref 8–23)
CO2: 26 mmol/L (ref 22–32)
Calcium: 8.9 mg/dL (ref 8.9–10.3)
Chloride: 98 mmol/L (ref 98–111)
Creatinine, Ser: 0.65 mg/dL (ref 0.61–1.24)
GFR, Estimated: >60 mL/min (ref >60)
Glucose, Bld: 115 mg/dL — ABNORMAL HIGH (ref 70–99)
Potassium: 3.9 mmol/L (ref 3.5–5.1)
Sodium: 130 mmol/L — ABNORMAL LOW (ref 135–145)

## 2023-05-20 LAB — CBC
HCT: 28.9 % — ABNORMAL LOW (ref 39.0–52.0)
Hemoglobin: 9.6 g/dL — ABNORMAL LOW (ref 13.0–17.0)
MCH: 28.6 pg (ref 26.0–34.0)
MCHC: 33.2 g/dL (ref 30.0–36.0)
MCV: 86 fL (ref 80.0–100.0)
Platelets: 263 10*3/uL (ref 150–400)
RBC: 3.36 MIL/uL — ABNORMAL LOW (ref 4.22–5.81)
RDW: 13.9 % (ref 11.5–15.5)
WBC: 9.4 10*3/uL (ref 4.0–10.5)
nRBC: 0 % (ref 0.0–0.2)

## 2023-05-20 NOTE — Progress Notes (Signed)
Physical Therapy Treatment Patient Details Name: [name] MRN: [id number] DOB: [birth year] Today's Date: 05/20/2023   History of Present Illness Patient is a 72 year old male from group home with unwitnessed fall with possible syncopal episode, striking his head. History of hypertension, hyperlipidemia, COPD, bipolar disorder, and schizophrenia. MRI of brain with no acute intracranial pathology. Patient is s/p R hip ORIF. PWB ( 50%)    PT Comments  Patient received in bed, he requires some encouragement to participate, but is agreeable. Patient requires mod A +1-2 for bed mobility. Decreased initiation due to pain. Requires assistance moving R LE in bed. He is able to stand from elevated bed with +2 mod A. Patient pain limited and is unable to reduce weight bearing through R LE in standing. He is unable to safely shift weight in standing and was also found to have BM requiring assistance for cleaning. Patient unaware of being soiled. He will continue to benefit from skilled PT to improve functional mobility and safety.       If plan is discharge home, recommend the following: Two people to help with walking and/or transfers;A lot of help with bathing/dressing/bathroom   Can travel by private vehicle     No  Equipment Recommendations  Rolling walker (2 wheels)    Recommendations for Other Services       Precautions / Restrictions Precautions Precautions: Fall Restrictions Weight Bearing Restrictions: Yes RLE Weight Bearing: Partial weight bearing RLE Partial Weight Bearing Percentage or Pounds: 50%     Mobility  Bed Mobility Overal bed mobility: Needs Assistance Bed Mobility: Supine to Sit, Sit to Supine     Supine to sit: Mod assist, HOB elevated, +2 for physical assistance Sit to supine: Mod assist, +2 for safety/equipment        Transfers Overall transfer level: Needs assistance Equipment used: Rolling walker (2 wheels) Transfers: Sit to/from Stand Sit to Stand:  Mod assist, +2 physical assistance, From elevated surface           General transfer comment: Patient is able to stand briefly with shaky balance. Putting full weight on R LE in standing. Unable to shift weight.    Ambulation/Gait               General Gait Details: unable   Stairs             Wheelchair Mobility     Tilt Bed    Modified Rankin (Stroke Patients Only)       Balance Overall balance assessment: Needs assistance Sitting-balance support: Feet supported Sitting balance-Leahy Scale: Good     Standing balance support: Bilateral upper extremity supported, During functional activity, Reliant on assistive device for balance Standing balance-Leahy Scale: Poor Standing balance comment: +2 needed for standing balance                            Cognition Arousal: Alert Behavior During Therapy: Flat affect Overall Cognitive Status: No family/caregiver present to determine baseline cognitive functioning Area of Impairment: Orientation, Attention, Memory, Following commands, Safety/judgement, Awareness                 Orientation Level: Situation, Disoriented to, Time Current Attention Level: Sustained Memory: Decreased short-term memory, Decreased recall of precautions Following Commands: Follows one step commands with increased time Safety/Judgement: Decreased awareness of deficits, Decreased awareness of safety Awareness: Intellectual   General Comments: cues for adherence to PWBing and patient seems to still have  full weight on R LE in standing        Exercises      General Comments        Pertinent Vitals/Pain Pain Assessment Faces Pain Scale: Hurts a little bit Breathing: normal Negative Vocalization: none Facial Expression: facial grimacing Body Language: relaxed Consolability: no need to console PAINAD Score: 2 Pain Location: R hip Pain Descriptors / Indicators: Discomfort, Sore Pain Intervention(s):  Monitored during session, Repositioned    Home Living                          Prior Function            PT Goals (current goals can now be found in the care plan section) Acute Rehab PT Goals Patient Stated Goal: none stated PT Goal Formulation: Patient unable to participate in goal setting Time For Goal Achievement: 06/02/23 Progress towards PT goals: Progressing toward goals    Frequency    Min 1X/week      PT Plan      Co-evaluation PT/OT/SLP Co-Evaluation/Treatment: Yes Reason for Co-Treatment: For patient/therapist safety;To address functional/ADL transfers PT goals addressed during session: Mobility/safety with mobility;Balance;Proper use of DME        AM-PAC PT "6 Clicks" Mobility   Outcome Measure  Help needed turning from your back to your side while in a flat bed without using bedrails?: A Lot Help needed moving from lying on your back to sitting on the side of a flat bed without using bedrails?: A Lot Help needed moving to and from a bed to a chair (including a wheelchair)?: A Lot Help needed standing up from a chair using your arms (e.g., wheelchair or bedside chair)?: A Lot Help needed to walk in hospital room?: Total Help needed climbing 3-5 steps with a railing? : Total 6 Click Score: 10    End of Session Equipment Utilized During Treatment: Gait belt Activity Tolerance: Patient limited by pain Patient left: in bed;with call bell/phone within reach;with bed alarm set Nurse Communication: Mobility status PT Visit Diagnosis: Unsteadiness on feet (R26.81);Other abnormalities of gait and mobility (R26.89);History of falling (Z91.81);Difficulty in walking, not elsewhere classified (R26.2);Muscle weakness (generalized) (M62.81);Pain Pain - Right/Left: Right Pain - part of body: Hip     Time: 1610-9604 PT Time Calculation (min) (ACUTE ONLY): 16 min  Charges:    $Therapeutic Activity: 8-22 mins PT General Charges $$ ACUTE PT VISIT: 1  Visit                     Eutimio Gharibian, PT, GCS 05/20/23,2:22 PM

## 2023-05-20 NOTE — NC FL2 (Signed)
[place] MEDICAID FL2 LEVEL OF CARE FORM     IDENTIFICATION  Patient Name: [name] Birthdate: [birth year] Sex: male Admission Date (Current Location): 05/06/2023  [facility] and [place] Number:  Chiropodist and Address:  [facility], [address], [place], [place] [address]      Provider Number: [id number]  Attending Physician Name and Address:  Enedina Finner, MD  Relative Name and Phone Number:  Cayman Hanthorn [phone]    Current Level of Care: Hospital Recommended Level of Care: Skilled Nursing Facility Prior Approval Number:    Date Approved/Denied:   PASRR Number: [id number]  Discharge Plan: SNF    Current Diagnoses: Patient Active Problem List   Diagnosis Date Noted   Closed displaced intertrochanteric fracture of right femur (HCC) 05/08/2023   Protein-calorie malnutrition, severe 05/07/2023   Cellulitis of right hip 05/07/2023   Pressure injury of skin 05/07/2023   Chronic diastolic CHF (congestive heart failure) (HCC) 05/06/2023   Hyperthyroidism 05/06/2023   Fall 03/17/2023   Scalp laceration 03/17/2023   Infection due to ESBL-producing Escherichia coli 03/17/2023   E coli bacteremia 03/15/2023   Hypothyroidism 03/05/2023   Syncope and collapse 03/04/2023   History of stroke 04/13/2021   Hyponatremia 04/10/2021   Wheeze    Unsteady gait    Thrombocytopenia (HCC)    COVID-19 virus infection    Elevated d-dimer    Depression    Elevated LFTs    Weakness 04/02/2021   Vitamin D deficiency 08/29/2015   Prediabetes 08/29/2015   Essential hypertension 08/28/2015   GERD (gastroesophageal reflux disease) 08/28/2015   Hyperlipidemia 08/28/2015   BPH (benign prostatic hyperplasia) 08/28/2015   Hx of sleep apnea 08/28/2015   COPD (chronic obstructive pulmonary disease) (HCC) 08/28/2015   Smoker 08/28/2015   Hx of lower gastrointestinal bleeding 08/28/2015   Hx of gastritis 08/28/2015   Bipolar 1 disorder (HCC) 08/28/2015    Schizophrenia (HCC) 08/28/2015    Orientation RESPIRATION BLADDER Height & Weight     Self, Time, Situation  Normal Continent Weight: 74 kg Height:  5\' 9"  (175.3 cm)  BEHAVIORAL SYMPTOMS/MOOD NEUROLOGICAL BOWEL NUTRITION STATUS      Incontinent  (See Discharge Summary)  AMBULATORY STATUS COMMUNICATION OF NEEDS Skin   Extensive Assist Verbally Normal                       Personal Care Assistance Level of Assistance  Bathing, Feeding, Dressing Bathing Assistance: Limited assistance Feeding assistance: Limited assistance Dressing Assistance: Limited assistance     Functional Limitations Info  Sight, Hearing, Speech Sight Info: Adequate Hearing Info: Adequate Speech Info: Adequate    SPECIAL CARE FACTORS FREQUENCY  PT (By licensed PT), OT (By licensed OT)     PT Frequency: 5x weekly OT Frequency: 5x weekly            Contractures Contractures Info: Not present    Additional Factors Info  Code Status, Allergies Code Status Info: Full Code Allergies Info: Asa (Aspirin)           Current Medications (05/20/2023):  This is the current hospital active medication list Current Facility-Administered Medications  Medication Dose Route Frequency Provider Last Rate Last Admin   acetaminophen (TYLENOL) tablet 325-650 mg  325-650 mg Oral Q6H PRN Deeann Saint, MD       albuterol (PROVENTIL) (2.5 MG/3ML) 0.083% nebulizer solution 2.5 mg  2.5 mg Nebulization Q4H PRN Deeann Saint, MD       alum &  mag hydroxide-simeth (MAALOX/MYLANTA) 200-200-20 MG/5ML suspension 30 mL  30 mL Oral Q4H PRN Deeann Saint, MD       ascorbic acid (VITAMIN C) tablet 500 mg  500 mg Oral BID Deeann Saint, MD   500 mg at 05/20/23 1024   bisacodyl (DULCOLAX) suppository 10 mg  10 mg Rectal Daily PRN Deeann Saint, MD       cholecalciferol (VITAMIN D3) 25 MCG (1000 UNIT) tablet 1,000 Units  1,000 Units Oral Daily Deeann Saint, MD   1,000 Units at 05/20/23 1024   citalopram (CELEXA) tablet  40 mg  40 mg Oral Daily Deeann Saint, MD   40 mg at 05/20/23 1023   dextromethorphan-guaiFENesin (MUCINEX DM) 30-600 MG per 12 hr tablet 1 tablet  1 tablet Oral BID PRN Deeann Saint, MD       doxycycline (VIBRA-TABS) tablet 100 mg  100 mg Oral Q12H Deeann Saint, MD   100 mg at 05/20/23 1024   enoxaparin (LOVENOX) injection 30 mg  30 mg Subcutaneous Q24H Deeann Saint, MD   30 mg at 05/20/23 1029   feeding supplement (ENSURE ENLIVE / ENSURE PLUS) liquid 237 mL  237 mL Oral TID BM Deeann Saint, MD   237 mL at 05/20/23 1029   ferrous sulfate tablet 325 mg  325 mg Oral Q breakfast Deeann Saint, MD   325 mg at 05/20/23 1025   Gerhardt's butt cream   Topical TID Deeann Saint, MD   Given at 05/20/23 1029   hydrALAZINE (APRESOLINE) injection 5 mg  5 mg Intravenous Q2H PRN Deeann Saint, MD       HYDROcodone-acetaminophen (NORCO/VICODIN) 5-325 MG per tablet 1-2 tablet  1-2 tablet Oral Q4H PRN Deeann Saint, MD       leptospermum manuka honey (MEDIHONEY) paste 1 Application  1 Application Topical Daily Deeann Saint, MD   1 Application at 05/20/23 1029   menthol-cetylpyridinium (CEPACOL) lozenge 3 mg  1 lozenge Oral PRN Deeann Saint, MD       Or   phenol (CHLORASEPTIC) mouth spray 1 spray  1 spray Mouth/Throat PRN Deeann Saint, MD       methimazole (TAPAZOLE) tablet 5 mg  5 mg Oral TID Deeann Saint, MD   5 mg at 05/20/23 1030   metoCLOPramide (REGLAN) tablet 5-10 mg  5-10 mg Oral Q8H PRN Deeann Saint, MD       Or   metoCLOPramide (REGLAN) injection 5-10 mg  5-10 mg Intravenous Q8H PRN Deeann Saint, MD       morphine (PF) 2 MG/ML injection 0.5-1 mg  0.5-1 mg Intravenous Q2H PRN Deeann Saint, MD       multivitamin with minerals tablet 1 tablet  1 tablet Oral Daily Deeann Saint, MD   1 tablet at 05/20/23 1024   nicotine (NICODERM CQ - dosed in mg/24 hours) patch 21 mg  21 mg Transdermal Daily Deeann Saint, MD   21 mg at 05/20/23 1032   ondansetron (ZOFRAN) injection 4 mg  4 mg  Intravenous Q8H PRN Deeann Saint, MD       Oral care mouth rinse  15 mL Mouth Rinse 4 times per day Deeann Saint, MD   15 mL at 05/20/23 1031   Oral care mouth rinse  15 mL Mouth Rinse PRN Deeann Saint, MD       risperiDONE (RISPERDAL) tablet 1 mg  1 mg Oral Daily Deeann Saint, MD   1 mg at 05/20/23 1023   senna (SENOKOT) tablet 8.6 mg  1 tablet Oral BID  Deeann Saint, MD   8.6 mg at 05/20/23 1025   simvastatin (ZOCOR) tablet 20 mg  20 mg Oral Daily Deeann Saint, MD   20 mg at 05/20/23 1025   sodium chloride flush (NS) 0.9 % injection 10-40 mL  10-40 mL Intracatheter Q12H Deeann Saint, MD   10 mL at 05/20/23 1031   sodium chloride flush (NS) 0.9 % injection 10-40 mL  10-40 mL Intracatheter PRN Deeann Saint, MD       sodium phosphate (FLEET) enema 1 enema  1 enema Rectal Once PRN Deeann Saint, MD       tamsulosin [facility]) capsule 0.4 mg  0.4 mg Oral QPC supper Deeann Saint, MD   0.4 mg at 05/19/23 1700   zinc sulfate capsule 220 mg  220 mg Oral Daily Deeann Saint, MD   220 mg at 05/20/23 1024   zolpidem (AMBIEN) tablet 5 mg  5 mg Oral QHS PRN Deeann Saint, MD         Discharge Medications: Please see discharge summary for a list of discharge medications.  Relevant Imaging Results:  Relevant Lab Results:   Additional Information [id number]  Garret Reddish, RN

## 2023-05-20 NOTE — Progress Notes (Signed)
Occupational Therapy Treatment Patient Details Name: [name] MRN: [id number] DOB: June 25, 1951 Today's Date: 05/20/2023   History of present illness Patient is a 72 year old male from group home with unwitnessed fall with possible syncopal episode, striking his head. History of hypertension, hyperlipidemia, COPD, bipolar disorder, and schizophrenia. MRI of brain with no acute intracranial pathology. Patient is s/p R hip ORIF. PWB ( 50%)   OT comments  Chart reviewed prior to tx session. Pt seen for OT treatment on this date. Upon arrival to room pt awake in bed. Tx session targeted improved ADL activity tolerance and mobility. Pt was alert and responding to questions with 3 or less words. On arrival to room pt tray untouched, stated he isn't hungry. Pt requires MODA + 2 for bed mobility, assist for LE and trunk control. Pt reported pain with movement in his R hip, monitored throughout. STS from EOB + RW and  MODA + 2, Pt able to adhere to PWBing precautions while static standing, unable to adhere to take steps. Pt unaware of soiled bed, completed MAXA peri care at bed level, Pt rolled L and R with MIN verbal/tactile cues for cleaning. Pt making good progress toward goals, will continue to follow POC. Discharge recommendation remains appropriate. OT will follow acutely.           If plan is discharge home, recommend the following:  Two people to help with walking and/or transfers;A lot of help with bathing/dressing/bathroom;Assistance with cooking/housework;Direct supervision/assist for medications management;Assist for transportation;Supervision due to cognitive status;Help with stairs or ramp for entrance;Direct supervision/assist for financial management   Equipment Recommendations  Other (comment)    Recommendations for Other Services      Precautions / Restrictions Precautions Precautions: Fall Restrictions Weight Bearing Restrictions: Yes RLE Weight Bearing: Partial weight  bearing RLE Partial Weight Bearing Percentage or Pounds: 50%       Mobility Bed Mobility Overal bed mobility: Needs Assistance Bed Mobility: Supine to Sit, Sit to Supine     Supine to sit: Mod assist, HOB elevated, +2 for physical assistance Sit to supine: Mod assist, +2 for safety/equipment   General bed mobility comments: LE managment throughout due to pain    Transfers Overall transfer level: Needs assistance Equipment used: Rolling walker (2 wheels) Transfers: Sit to/from Stand Sit to Stand: Mod assist, +2 physical assistance, From elevated surface           General transfer comment: Pt tolerated standing for ~1 min, unable to adhere to [facility] precautions when weight shifting.     Balance Overall balance assessment: Needs assistance Sitting-balance support: Feet supported Sitting balance-Leahy Scale: Good     Standing balance support: Bilateral upper extremity supported, During functional activity, Reliant on assistive device for balance Standing balance-Leahy Scale: Poor                             ADL either performed or assessed with clinical judgement   ADL Overall ADL's : Needs assistance/impaired Eating/Feeding: Set up;Supervision/ safety   Grooming: Wash/dry face;Supervision/safety;Bed level Grooming Details (indicate cue type and reason): MIN verbal cues for task completion                     Toileting- Clothing Manipulation and Hygiene: Maximal assistance;Bed level              Extremity/Trunk Assessment              Vision  Perception     Praxis      Cognition Arousal: Alert Behavior During Therapy: Flat affect Overall Cognitive Status: No family/caregiver present to determine baseline cognitive functioning Area of Impairment: Orientation, Attention, Memory, Following commands, Safety/judgement, Awareness, Problem solving                 Orientation Level: Situation, Disoriented to, Time Current  Attention Level: Sustained Memory: Decreased short-term memory, Decreased recall of precautions Following Commands: Follows one step commands with increased time Safety/Judgement: Decreased awareness of deficits, Decreased awareness of safety Awareness: Intellectual   General Comments: Multimodal cues for adherence to [facility] precautions.       Exercises Other Exercises Other Exercises: re edu: PWB precations, hand placement throughout mobility    Shoulder Instructions       General Comments      Pertinent Vitals/ Pain       Pain Assessment Pain Assessment: PAINAD Faces Pain Scale: Hurts a little bit Breathing: normal Negative Vocalization: none Facial Expression: facial grimacing Body Language: relaxed Consolability: no need to console PAINAD Score: 2 Pain Location: R hip Pain Descriptors / Indicators: Discomfort, Sore, Grimacing Pain Intervention(s): Limited activity within patient's tolerance, Monitored during session, Repositioned  Home Living                                          Prior Functioning/Environment              Frequency  Min 1X/week        Progress Toward Goals  OT Goals(current goals can now be found in the care plan section)  Progress towards OT goals: Progressing toward goals  Acute Rehab OT Goals Patient Stated Goal: get back to bed OT Goal Formulation: With patient Time For Goal Achievement: 06/02/23 Potential to Achieve Goals: Good  Plan      Co-evaluation      Reason for Co-Treatment: For patient/therapist safety;To address functional/ADL transfers PT goals addressed during session: Mobility/safety with mobility;Balance;Proper use of DME        AM-PAC OT "6 Clicks" Daily Activity     Outcome Measure   Help from another person eating meals?: A Little Help from another person taking care of personal grooming?: A Little Help from another person toileting, which includes using toliet, bedpan, or urinal?:  A Lot Help from another person bathing (including washing, rinsing, drying)?: A Lot Help from another person to put on and taking off regular upper body clothing?: A Little Help from another person to put on and taking off regular lower body clothing?: A Lot 6 Click Score: 15    End of Session Equipment Utilized During Treatment: Gait belt;Rolling walker (2 wheels)  OT Visit Diagnosis: Unsteadiness on feet (R26.81);Muscle weakness (generalized) (M62.81);Other abnormalities of gait and mobility (R26.89)   Activity Tolerance Patient tolerated treatment well   Patient Left in bed;with call bell/phone within reach;with bed alarm set   Nurse Communication          Time: 6045-4098 OT Time Calculation (min): 16 min  Charges: OT General Charges $OT Visit: 1 Visit OT Treatments $Self Care/Home Management : 8-22 mins  Glenard Haring, OTS

## 2023-05-20 NOTE — Progress Notes (Signed)
PT Cancellation Note  Patient Details Name: [name] MRN: [id number] DOB: July 21, 1950   Cancelled Treatment:    Reason Eval/Treat Not Completed: Patient declined, no reason specified Patient asked to wait until later. Will re-attempt this pm.  [name] 05/20/2023, 9:10 AM

## 2023-05-20 NOTE — Plan of Care (Signed)
  Problem: Education: Goal: Knowledge of General Education information will improve Description: Including pain rating scale, medication(s)/side effects and non-pharmacologic comfort measures Outcome: Progressing   Problem: Health Behavior/Discharge Planning: Goal: Ability to manage health-related needs will improve Outcome: Progressing   Problem: Clinical Measurements: Goal: Ability to maintain clinical measurements within normal limits will improve Outcome: Progressing Goal: Will remain free from infection Outcome: Progressing Goal: Diagnostic test results will improve Outcome: Progressing Goal: Respiratory complications will improve Outcome: Progressing Goal: Cardiovascular complication will be avoided Outcome: Progressing   Problem: Activity: Goal: Risk for activity intolerance will decrease Outcome: Progressing   Problem: Nutrition: Goal: Adequate nutrition will be maintained Outcome: Progressing   Problem: Coping: Goal: Level of anxiety will decrease Outcome: Progressing   Problem: Elimination: Goal: Will not experience complications related to bowel motility Outcome: Progressing Goal: Will not experience complications related to urinary retention Outcome: Progressing   Problem: Pain Managment: Goal: General experience of comfort will improve Outcome: Progressing   Problem: Safety: Goal: Ability to remain free from injury will improve Outcome: Progressing   Problem: Skin Integrity: Goal: Risk for impaired skin integrity will decrease Outcome: Progressing   Problem: Education: Goal: Verbalization of understanding the information provided (i.e., activity precautions, restrictions, etc) will improve Outcome: Progressing Goal: Individualized Educational Video(s) Outcome: Progressing   Problem: Activity: Goal: Ability to ambulate and perform ADLs will improve Outcome: Progressing   Problem: Clinical Measurements: Goal: Postoperative complications will be  avoided or minimized Outcome: Progressing   Problem: Self-Concept: Goal: Ability to maintain and perform role responsibilities to the fullest extent possible will improve Outcome: Progressing   Problem: Pain Management: Goal: Pain level will decrease Outcome: Progressing   Problem: Education: Goal: Knowledge of risk factors and measures for prevention of condition will improve Outcome: Progressing   Problem: Coping: Goal: Psychosocial and spiritual needs will be supported Outcome: Progressing   Problem: Respiratory: Goal: Will maintain a patent airway Outcome: Progressing Goal: Complications related to the disease process, condition or treatment will be avoided or minimized Outcome: Progressing

## 2023-05-20 NOTE — Plan of Care (Signed)

## 2023-05-20 NOTE — Progress Notes (Signed)
[facility]  - [place] at [facility]   PATIENT NAME: [name]    MR#:  [id number]  DATE OF BIRTH:  1950/11/05  SUBJECTIVE:  patient postop day 2 hip surgery.  No family at bedside. Overall doing well. No issues per RN.  VITALS:  Blood pressure (!) 140/82, pulse (!) 105, temperature 98.8 F (37.1 C), resp. rate 16, height 5\' 9"  (1.753 m), weight 74 kg, SpO2 97%.  PHYSICAL EXAMINATION:   GENERAL:  72 y.o.-year-old patient with no acute distress.  LUNGS: Normal breath sounds bilaterally, no wheezing CARDIOVASCULAR: S1, S2 normal. No murmur   ABDOMEN: Soft, nontender, nondistended. EXTREMITIES: surgical site dressing present    NEUROLOGIC: nonfocal  patient is alert and awake  LABORATORY PANEL:  CBC Recent Labs  Lab 05/20/23 0527  WBC 9.4  HGB 9.6*  HCT 28.9*  PLT 263    Chemistries  Recent Labs  Lab 05/17/23 0446 05/19/23 0511 05/20/23 0527  NA 134*   < > 130*  K 4.0   < > 3.9  CL 100   < > 98  CO2 26   < > 26  GLUCOSE 91   < > 115*  BUN 16   < > 20  CREATININE 0.67   < > 0.65  CALCIUM 8.9   < > 8.9  AST 20  --   --   ALT 15  --   --   ALKPHOS 120  --   --   BILITOT 0.9  --   --    < > = values in this interval not displayed.   RADIOLOGY:  No results found.  Assessment and Plan   [name] is a 72 y.o. male with medical history significant of dementia, bipolar, schizophrenia, hypertension, hyperlipidemia, COPD, diastolic CHF, stroke, hypothyroidism, UTI-ESBL, who presented to the hospital with general weakness.reportedly, he was found sitting on the couch covered in urine-soaked blanket.    Comminuted right hip fracture (intertrochanteric proximal right femur): -- Patient has been evaluated by Dr. Hyacinth Meeker, orthopedic surgeon.   --He wants to delay surgery until patient has had adequate antibiotics for right hip cellulitis.   --Continue Lovenox for DVT prophylaxis. --Follow-up with Dr. Hyacinth Meeker, orthopedic surgeon. --10/22--Dr  Audelia Acton to see pt--for second opinion per Timmy Beltran request--appreciate input --10/23-- Dr Hyacinth Meeker plans to do surgery next Tuesday. --10/24--skin wound looks better today. Changed to IV cefazolin after d/w ID RPH. Cont abxs till ortho surgery is completed next Tuesday --10/30--assumed care POD #1 sitting in the chair. Doing well --10/31--PT/OT to try work again today   Right hip cellulitis:  -- Analgesics as needed for pain. --evaluated by WOC--appears to be improved    COVID-19 infection: No respiratory symptoms or hypoxia.     COPD: Compensated.  Continue bronchodilators   Chronic diastolic CHF: Compensated.  2D echo in August 2024 showed EF estimated at 65%, grade 1 diastolic dysfunction.    Hyperthyroidism -Continue home methimazole   Bipolar 1 disorder (HCC) and schizophrenia (HCC): Patient is calm currently -Celexa, risperidone -Patient is not taking fluphenazine currently   BPH (benign prostatic hyperplasia) -Flomax   Procedures:INTRAMEDULLARY (IM) NAIL INTERTROCHANTERIC right hip  SURGEON:  Valinda Hoar Family communication :none today Consults :ortho CODE STATUS: full DVT Prophylaxis :lovenox  Status is: Inpatient Remains inpatient appropriate because: TOC for d/c plans    TOTAL TIME TAKING CARE OF THIS PATIENT: 35 minutes.  >50% time spent on counselling and coordination of care  Note: This dictation was prepared  with Dragon dictation along with smaller phrase technology. Any transcriptional errors that result from this process are unintentional.  Enedina Finner M.D    [facility]   CC: Primary care physician; Koren Bound, NP

## 2023-05-21 DIAGNOSIS — L03115 Cellulitis of right lower limb: Secondary | ICD-10-CM | POA: Diagnosis not present

## 2023-05-21 DIAGNOSIS — I10 Essential (primary) hypertension: Secondary | ICD-10-CM | POA: Diagnosis not present

## 2023-05-21 DIAGNOSIS — J449 Chronic obstructive pulmonary disease, unspecified: Secondary | ICD-10-CM | POA: Diagnosis not present

## 2023-05-21 DIAGNOSIS — S72001A Fracture of unspecified part of neck of right femur, initial encounter for closed fracture: Secondary | ICD-10-CM | POA: Diagnosis not present

## 2023-05-21 MED ORDER — ENOXAPARIN SODIUM 40 MG/0.4ML IJ SOSY
40.0000 mg | PREFILLED_SYRINGE | INTRAMUSCULAR | Status: DC
  Administered 2023-05-22 – 2023-05-25 (×4): 40 mg via SUBCUTANEOUS
  Filled 2023-05-21 (×4): qty 0.4

## 2023-05-21 NOTE — Plan of Care (Signed)

## 2023-05-21 NOTE — Progress Notes (Signed)
PT Cancellation Note  Patient Details Name: [name] MRN: [id number] DOB: 31-Mar-1951   Cancelled Treatment:    Reason Eval/Treat Not Completed: Patient declined, no reason specified. Pt received in bed and did not agree to PT session. Pt was sleeping upon arrival and partially woke up to communicate with slurred/unclear speech. Pt attempted to participate in PT session, however then refused mobility. Will re-attempt later today if time permits.   Ariyana Faw [place] Howard SPT, LAT, ATC  Louretta Tantillo Sauvignon-Howard 05/21/2023, 1:07 PM

## 2023-05-21 NOTE — Progress Notes (Signed)
Physical Therapy Treatment Patient Details Name: [name] MRN: [id number] DOB: 03-Jun-1951 Today's Date: 05/21/2023   History of Present Illness Patient is a 72 year old male from group home with unwitnessed fall with possible syncopal episode, striking his head. History of hypertension, hyperlipidemia, COPD, bipolar disorder, and schizophrenia. MRI of brain with no acute intracranial pathology. Patient is s/p R hip ORIF. PWB ( 50%)    PT Comments  Pt received in bed, with increased encouragement pt agreed to PT session. Pt reports pain in right hip is still present and he believes that he is unable to move his leg due to pain. Pt performed all bed mobility ModA+2 for trunk and BLE management. While at EOB pt did not report any s/sx relative to dizziness. Pt performed active left hip flx and knee ext x5, and performed x3 PROM right knee ext. Pt refused further mobility due to pain and feeling like he won't be able to do it. Pt needed increased time to follow one step commands or answer questions. Pt tolerated Tx fair today and will continue to benefit from skilled PT sessions to improve strength, activity tolerance, and functional mobility in order to improve safety.    If plan is discharge home, recommend the following: Two people to help with walking and/or transfers;A lot of help with bathing/dressing/bathroom   Can travel by private vehicle     No  Equipment Recommendations  Rolling walker (2 wheels)    Recommendations for Other Services       Precautions / Restrictions Precautions Precautions: Fall Restrictions Weight Bearing Restrictions: Yes RLE Weight Bearing: Partial weight bearing RLE Partial Weight Bearing Percentage or Pounds: 50%     Mobility  Bed Mobility Overal bed mobility: Needs Assistance Bed Mobility: Supine to Sit, Sit to Supine     Supine to sit: Mod assist, +2 for physical assistance, HOB elevated Sit to supine: Mod assist, +2 for safety/equipment    General bed mobility comments: Pt needed assistance with bed mobility for trunk and BLE management.    Transfers                   General transfer comment: deferred    Ambulation/Gait               General Gait Details: deferred   Stairs             Wheelchair Mobility     Tilt Bed    Modified Rankin (Stroke Patients Only)       Balance Overall balance assessment: Needs assistance Sitting-balance support: Feet supported Sitting balance-Leahy Scale: Fair Sitting balance - Comments: Pt presents with posterior lean Postural control: Posterior lean     Standing balance comment: deferred                            Cognition Arousal: Alert Behavior During Therapy: Flat affect Overall Cognitive Status: No family/caregiver present to determine baseline cognitive functioning Area of Impairment: Attention, Memory, Following commands, Safety/judgement, Problem solving                   Current Attention Level: Sustained Memory: Decreased short-term memory, Decreased recall of precautions Following Commands: Follows one step commands inconsistently, Follows one step commands with increased time Safety/Judgement: Decreased awareness of deficits, Decreased awareness of safety   Problem Solving: Slow processing, Decreased initiation, Difficulty sequencing, Requires verbal cues, Requires tactile cues General Comments: Pt requires increased encouragement in  order to participate in PT session.        Exercises Other Exercises Other Exercises: Pt performed x5 left hip flx and knee ext. Pt able to perform R knee ext PROM x3    General Comments        Pertinent Vitals/Pain Pain Assessment Pain Assessment: Faces Faces Pain Scale: Hurts little more Pain Location: R hip Pain Descriptors / Indicators: Discomfort, Sore, Grimacing Pain Intervention(s): Monitored during session, Limited activity within patient's tolerance    Home  Living                          Prior Function            PT Goals (current goals can now be found in the care plan section) Acute Rehab PT Goals Patient Stated Goal: none stated PT Goal Formulation: Patient unable to participate in goal setting Time For Goal Achievement: 06/02/23 Progress towards PT goals: Progressing toward goals    Frequency    Min 1X/week      PT Plan      Co-evaluation              AM-PAC PT "6 Clicks" Mobility   Outcome Measure  Help needed turning from your back to your side while in a flat bed without using bedrails?: A Lot Help needed moving from lying on your back to sitting on the side of a flat bed without using bedrails?: A Lot Help needed moving to and from a bed to a chair (including a wheelchair)?: A Lot Help needed standing up from a chair using your arms (e.g., wheelchair or bedside chair)?: A Lot Help needed to walk in hospital room?: Total Help needed climbing 3-5 steps with a railing? : Total 6 Click Score: 10    End of Session Equipment Utilized During Treatment: Gait belt Activity Tolerance: Patient limited by pain;Patient limited by fatigue Patient left: in bed;with call bell/phone within reach;with bed alarm set Nurse Communication: Mobility status PT Visit Diagnosis: Unsteadiness on feet (R26.81);Other abnormalities of gait and mobility (R26.89);History of falling (Z91.81);Difficulty in walking, not elsewhere classified (R26.2);Muscle weakness (generalized) (M62.81);Pain Pain - Right/Left: Right Pain - part of body: Hip     Time: 1610-9604 PT Time Calculation (min) (ACUTE ONLY): 21 min  Charges:    $Therapeutic Activity: 8-22 mins PT General Charges $$ ACUTE PT VISIT: 1 Visit                     Lukis Bunt [facility] [facility] SPT, LAT, ATC    Marshawn Normoyle Sau[place]-Howard 05/21/2023, 3:21 PM

## 2023-05-21 NOTE — Plan of Care (Signed)
  Problem: Activity: Goal: Risk for activity intolerance will decrease Outcome: Progressing   Problem: Nutrition: Goal: Adequate nutrition will be maintained Outcome: Progressing   Problem: Coping: Goal: Level of anxiety will decrease Outcome: Progressing   Problem: Education: Goal: Knowledge of General Education information will improve Description: Including pain rating scale, medication(s)/side effects and non-pharmacologic comfort measures Outcome: Not Progressing   Problem: Health Behavior/Discharge Planning: Goal: Ability to manage health-related needs will improve Outcome: Not Progressing   Problem: Clinical Measurements: Goal: Ability to maintain clinical measurements within normal limits will improve Outcome: Not Progressing

## 2023-05-21 NOTE — Progress Notes (Signed)
[facility]  - [place] at [facility]   PATIENT NAME: [name]    MR#:  [id number]  DATE OF BIRTH:  Jan 25, 1951  SUBJECTIVE:  patient postop day  3 hip surgery.  No family at bedside. Overall doing well. No issues per RN.  VITALS:  Blood pressure (!) 152/81, pulse (!) 109, temperature 98.8 F (37.1 C), resp. rate 20, height 5\' 9"  (1.753 m), weight 74.5 kg, SpO2 96%.  PHYSICAL EXAMINATION:   GENERAL:  72 y.o.-year-old patient with no acute distress.  LUNGS: Normal breath sounds bilaterally, no wheezing CARDIOVASCULAR: S1, S2 normal. No murmur   ABDOMEN: Soft, nontender, nondistended. EXTREMITIES: surgical site dressing present    NEUROLOGIC: nonfocal  patient is alert and awake  LABORATORY PANEL:  CBC Recent Labs  Lab 05/20/23 0527  WBC 9.4  HGB 9.6*  HCT 28.9*  PLT 263    Chemistries  Recent Labs  Lab 05/17/23 0446 05/19/23 0511 05/20/23 0527  NA 134*   < > 130*  K 4.0   < > 3.9  CL 100   < > 98  CO2 26   < > 26  GLUCOSE 91   < > 115*  BUN 16   < > 20  CREATININE 0.67   < > 0.65  CALCIUM 8.9   < > 8.9  AST 20  --   --   ALT 15  --   --   ALKPHOS 120  --   --   BILITOT 0.9  --   --    < > = values in this interval not displayed.   RADIOLOGY:  No results found.  Assessment and Plan   [name] is a 72 y.o. male with medical history significant of dementia, bipolar, schizophrenia, hypertension, hyperlipidemia, COPD, diastolic CHF, stroke, hypothyroidism, UTI-ESBL, who presented to the hospital with general weakness.reportedly, he was found sitting on the couch covered in urine-soaked blanket.    Comminuted right hip fracture (intertrochanteric proximal right femur): -- Patient has been evaluated by Dr. Hyacinth Meeker, orthopedic surgeon.   --He wants to delay surgery until patient has had adequate antibiotics for right hip cellulitis.   --Continue Lovenox for DVT prophylaxis. --Follow-up with Dr. Hyacinth Meeker, orthopedic surgeon. --10/22--Dr  Audelia Acton to see pt--for second opinion per Timmy Sawin request--appreciate input --10/23-- Dr Hyacinth Meeker plans to do surgery next Tuesday. --10/24--skin wound looks better today. Changed to IV cefazolin after d/w ID RPH. Cont abxs till ortho surgery is completed next Tuesday --10/30--assumed care POD #1 sitting in the chair. Doing well --10/31--PT/OT to try work again today --11/1--overall stable   Right hip cellulitis:  -- Analgesics as needed for pain. --evaluated by WOC--appears to be improved    COVID-19 infection: No respiratory symptoms or hypoxia.     COPD: Compensated.  Continue bronchodilators   Chronic diastolic CHF: Compensated.  2D echo in August 2024 showed EF estimated at 65%, grade 1 diastolic dysfunction.    Hyperthyroidism -Continue home methimazole   Bipolar 1 disorder (HCC) and schizophrenia (HCC): Patient is calm currently -Celexa, risperidone -Patient is not taking fluphenazine currently   BPH (benign prostatic hyperplasia) -Flomax   Procedures:INTRAMEDULLARY (IM) NAIL INTERTROCHANTERIC right hip  SURGEON:  Valinda Hoar Family communication :none today Consults :ortho CODE STATUS: full DVT Prophylaxis :lovenox  Status is: Inpatient Remains inpatient appropriate because: TOC for d/c plans to  [facility] on monday    TOTAL TIME TAKING CARE OF THIS PATIENT: 35 minutes.  >50% time spent on counselling and coordination  of care  Note: This dictation was prepared with Dragon dictation along with smaller phrase technology. Any transcriptional errors that result from this process are unintentional.  Enedina Finner M.D    [facility]   CC: Primary care physician; Koren Bound, NP

## 2023-05-21 NOTE — TOC Progression Note (Signed)
Transition of Care [facility]) - Progression Note    Patient Details  Name: [name] MRN: [id number] Date of Birth: 1950-12-20  [facility] CM/SW Contact  Garret Reddish, RN Phone Number: 05/21/2023, 3:26 PM  Clinical Narrative:    Chart reviewed.  I have reviewed bed offers with [name].  He has chosen [facility].  I have made Tanya aware.  She informs that she will have semi-private room for patient on Monday.  I have informed [name] of the above information.  I will ask weekend team to submit SNF authorization on Sunday.    TOC will continue to follow for discharge planning.     Expected Discharge Plan: Skilled Nursing Facility Barriers to Discharge: Continued Medical Work up  Expected Discharge Plan and Services       Living arrangements for the past 2 months: Group Home                                       Social Determinants of Health (SDOH) Interventions SDOH Screenings   Food Insecurity: Patient Unable To Answer (05/06/2023)  Housing: Patient Unable To Answer (05/19/2023)  Transportation Needs: Patient Unable To Answer (05/19/2023)  Utilities: Patient Unable To Answer (05/06/2023)  Depression (PHQ2-9): Low Risk  (12/12/2020)  Tobacco Use: High Risk (05/18/2023)    Readmission Risk Interventions    05/09/2023    4:34 PM  Readmission Risk Prevention Plan  Transportation Screening Complete  PCP or Specialist Appt within 5-7 Days Complete  Home Care Screening Complete  Medication Review (RN CM) Complete

## 2023-05-21 NOTE — Progress Notes (Signed)
Subjective: 3 Days Post-Op Procedure(s) (LRB): INTRAMEDULLARY (IM) NAIL INTERTROCHANTERIC (Right) Patient is doing well overall.  Does not exhibit signs of pain.  Hemoglobin has remained stable. He remains on infection precautions for some reason.  His last urine culture was negative and he is passed the period of restrictions for COVID. He is started PT.  Progress will be slow due to his mental status. Will need rehab for and while.  Patient reports pain as mild.  Objective:   VITALS:   Vitals:   05/21/23 0915 05/21/23 0916  BP: (!) 152/81   Pulse: (!) 112 (!) 109  Resp: 20   Temp: 98.8 F (37.1 C)   SpO2: 96%     Neurologically intact Sensation intact distally Dorsiflexion/Plantar flexion intact Incision: dressing C/D/I Range of motion of the hip is good with very little pain. The wounds look good with no sign of infection.  Dressing changed today.  LABS Recent Labs    05/19/23 0511 05/20/23 0527  HGB 9.8* 9.6*  HCT 30.4* 28.9*  WBC 11.0* 9.4  PLT 279 263    Recent Labs    05/19/23 0511 05/20/23 0527  NA 130* 130*  K 4.2 3.9  BUN 17 20  CREATININE 0.59* 0.65  GLUCOSE 101* 115*    No results for input(s): "LABPT", "INR" in the last 72 hours.   Assessment/Plan: 3 Days Post-Op Procedure(s) (LRB): INTRAMEDULLARY (IM) NAIL INTERTROCHANTERIC (Right)   Advance diet Up with therapy Discharge to SNF when available.

## 2023-05-21 NOTE — Care Management Important Message (Signed)
Important Message  Patient Details  Name: [name] MRN: [id number] Date of Birth: Jul 09, 1951   Important Message Given:  Other (see comment)  Patient is in an isolation room so I called his room [phone]) to review the Important Message from Medicare with him but there was no answer. Will try again.   Olegario Messier A Maximina Pirozzi 05/21/2023, 11:44 AM

## 2023-05-22 DIAGNOSIS — I10 Essential (primary) hypertension: Secondary | ICD-10-CM | POA: Diagnosis not present

## 2023-05-22 DIAGNOSIS — L03115 Cellulitis of right lower limb: Secondary | ICD-10-CM | POA: Diagnosis not present

## 2023-05-22 DIAGNOSIS — J449 Chronic obstructive pulmonary disease, unspecified: Secondary | ICD-10-CM | POA: Diagnosis not present

## 2023-05-22 DIAGNOSIS — S72001A Fracture of unspecified part of neck of right femur, initial encounter for closed fracture: Secondary | ICD-10-CM | POA: Diagnosis not present

## 2023-05-22 NOTE — Progress Notes (Signed)
Physical Therapy Treatment Patient Details Name: [name] MRN: [id number] DOB: March 01, 1951 Today's Date: 05/22/2023   History of Present Illness Patient is a 72 year old male from group home with unwitnessed fall with possible syncopal episode, striking his head. History of hypertension, hyperlipidemia, COPD, bipolar disorder, and schizophrenia. MRI of brain with no acute intracranial pathology. Patient is s/p R hip ORIF. PWB ( 50%)    PT Comments  Pt received in bed and agreed to PT session. Pt reports continued pain present in right hip. Pt performed supine>sit MinA for trunk and BLE management, x3STS with the use of RW (2wheels) ModA+2, while standing pt required ModA for stability due to generalized BLE weakness, and sit>supine ModA+2 for trunk and BLE management. Pt performed x3STS due to having a BM during session, pt and bed cleaned. RN notified of BM that occurred during session. Pt requires verbal/tactile cues in addition to increased time for processing. Pt tolerated Tx well and will continue to benefit from skilled PT sessions to improve activity tolerance, balance, strength, and functional mobility to maximize safety.     If plan is discharge home, recommend the following: Two people to help with walking and/or transfers;A lot of help with bathing/dressing/bathroom   Can travel by private vehicle     No  Equipment Recommendations  Rolling walker (2 wheels)    Recommendations for Other Services       Precautions / Restrictions Precautions Precautions: Fall Restrictions Weight Bearing Restrictions: Yes RLE Weight Bearing: Partial weight bearing RLE Partial Weight Bearing Percentage or Pounds: 50%     Mobility  Bed Mobility Overal bed mobility: Needs Assistance Bed Mobility: Supine to Sit, Sit to Supine     Supine to sit: Min assist, HOB elevated, Used rails Sit to supine: Mod assist, +2 for safety/equipment   General bed mobility comments: Pt needed assistance  with bed mobility for trunk and BLE management.    Transfers Overall transfer level: Needs assistance Equipment used: Rolling walker (2 wheels) Transfers: Sit to/from Stand Sit to Stand: Mod assist, +2 physical assistance, From elevated surface           General transfer comment: Pt required assistance for STS. Pt demonstrates great UE strength. Pt performed a total of 3 STS    Ambulation/Gait               General Gait Details: deferred   Stairs             Wheelchair Mobility     Tilt Bed    Modified Rankin (Stroke Patients Only)       Balance Overall balance assessment: Needs assistance Sitting-balance support: Feet supported Sitting balance-Leahy Scale: Fair     Standing balance support: Bilateral upper extremity supported, During functional activity, Reliant on assistive device for balance Standing balance-Leahy Scale: Poor Standing balance comment: Pt requires ModA when standing as BLE become weaker when standing for a period of time.                            Cognition Arousal: Alert Behavior During Therapy: Flat affect Overall Cognitive Status: No family/caregiver present to determine baseline cognitive functioning Area of Impairment: Attention, Following commands, Safety/judgement, Problem solving                   Current Attention Level: Sustained   Following Commands: Follows one step commands inconsistently, Follows one step commands with increased time Safety/Judgement: Decreased awareness  of deficits, Decreased awareness of safety   Problem Solving: Slow processing, Decreased initiation, Difficulty sequencing, Requires verbal cues, Requires tactile cues General Comments: Pt willing to participate in today's session.        Exercises      General Comments        Pertinent Vitals/Pain Pain Assessment Pain Assessment: Faces Faces Pain Scale: Hurts a little bit Pain Location: R hip Pain Descriptors /  Indicators: Discomfort, Sore, Grimacing Pain Intervention(s): Monitored during session    Home Living                          Prior Function            PT Goals (current goals can now be found in the care plan section) Acute Rehab PT Goals Patient Stated Goal: none stated PT Goal Formulation: Patient unable to participate in goal setting Time For Goal Achievement: 06/02/23 Potential to Achieve Goals: Fair    Frequency    Min 1X/week      PT Plan      Co-evaluation              AM-PAC PT "6 Clicks" Mobility   Outcome Measure  Help needed turning from your back to your side while in a flat bed without using bedrails?: A Lot Help needed moving from lying on your back to sitting on the side of a flat bed without using bedrails?: A Lot Help needed moving to and from a bed to a chair (including a wheelchair)?: A Lot Help needed standing up from a chair using your arms (e.g., wheelchair or bedside chair)?: A Lot Help needed to walk in hospital room?: Total Help needed climbing 3-5 steps with a railing? : Total 6 Click Score: 10    End of Session Equipment Utilized During Treatment: Gait belt Activity Tolerance: Patient tolerated treatment well Patient left: in bed;with call bell/phone within reach;with bed alarm set Nurse Communication: Mobility status PT Visit Diagnosis: Unsteadiness on feet (R26.81);Other abnormalities of gait and mobility (R26.89);History of falling (Z91.81);Difficulty in walking, not elsewhere classified (R26.2);Muscle weakness (generalized) (M62.81);Pain Pain - Right/Left: Right Pain - part of body: Hip     Time: 1610-9604 PT Time Calculation (min) (ACUTE ONLY): 22 min  Charges:    $Therapeutic Activity: 8-22 mins PT General Charges $$ ACUTE PT VISIT: 1 Visit                     Jerelyn Trimarco [place] SPT, LAT, ATC   Erez Mccallum Sau[place]ignon-Howard 05/22/2023, 2:24 PM

## 2023-05-22 NOTE — Progress Notes (Signed)
[facility]  - [place] at [facility]   PATIENT NAME: [name]    MR#:  [id number]  DATE OF BIRTH:  02/25/1951  SUBJECTIVE:  patient postop day  4 hip surgery.  No family at bedside. Overall doing well. No issues per RN.  VITALS:  Blood pressure 114/72, pulse (!) 109, temperature 98.8 F (37.1 C), resp. rate 16, height 5\' 9"  (1.753 m), weight 67.7 kg, SpO2 96%.  PHYSICAL EXAMINATION:   GENERAL:  71 y.o.-year-old patient with no acute distress.  LUNGS: Normal breath sounds bilaterally, no wheezing Hard right armpit swelling appears chronic,mobile and non tender. CARDIOVASCULAR: S1, S2 normal. No murmur   ABDOMEN: Soft, nontender, nondistended. EXTREMITIES: surgical site dressing present    NEUROLOGIC: nonfocal  patient is alert and awake  LABORATORY PANEL:  CBC Recent Labs  Lab 05/20/23 0527  WBC 9.4  HGB 9.6*  HCT 28.9*  PLT 263    Chemistries  Recent Labs  Lab 05/17/23 0446 05/19/23 0511 05/20/23 0527  NA 134*   < > 130*  K 4.0   < > 3.9  CL 100   < > 98  CO2 26   < > 26  GLUCOSE 91   < > 115*  BUN 16   < > 20  CREATININE 0.67   < > 0.65  CALCIUM 8.9   < > 8.9  AST 20  --   --   ALT 15  --   --   ALKPHOS 120  --   --   BILITOT 0.9  --   --    < > = values in this interval not displayed.   RADIOLOGY:  No results found.  Assessment and Plan   [name] is a 72 y.o. male with medical history significant of dementia, bipolar, schizophrenia, hypertension, hyperlipidemia, COPD, diastolic CHF, stroke, hypothyroidism, UTI-ESBL, who presented to the hospital with general weakness.reportedly, he was found sitting on the couch covered in urine-soaked blanket.    Comminuted right hip fracture (intertrochanteric proximal right femur): -- Patient has been evaluated by Dr. Hyacinth Meeker, orthopedic surgeon.   --He wants to delay surgery until patient has had adequate antibiotics for right hip cellulitis.   --Continue Lovenox for DVT  prophylaxis. --Follow-up with Dr. Hyacinth Meeker, orthopedic surgeon. --10/22--Dr Audelia Acton to see pt--for second opinion per Timmy Siedschlag request--appreciate input --10/23-- Dr Hyacinth Meeker plans to do surgery next Tuesday. --10/24--skin wound looks better today. Changed to IV cefazolin after d/w ID RPH. Cont abxs till ortho surgery is completed next Tuesday --10/30--assumed care POD #1 sitting in the chair. Doing well --10/31--PT/OT to try work again today --11/1--overall stable --11/2--overall ok. Right armpit hard swelling noted by Rn. Pt tells me he has had it for sometime and non tender and mobile. Will do w/u if it bothers pt   Right hip cellulitis:  -- Analgesics as needed for pain. --evaluated by WOC--appears to be improved    COVID-19 infection: No respiratory symptoms or hypoxia.     COPD: Compensated.  Continue bronchodilators   Chronic diastolic CHF: Compensated.  2D echo in August 2024 showed EF estimated at 65%, grade 1 diastolic dysfunction.    Hyperthyroidism -Continue home methimazole   Bipolar 1 disorder (HCC) and schizophrenia (HCC): Patient is calm currently -Celexa, risperidone -Patient is not taking fluphenazine currently   BPH (benign prostatic hyperplasia) -Flomax   Procedures:INTRAMEDULLARY (IM) NAIL INTERTROCHANTERIC right hip  SURGEON:  Valinda Hoar Family communication :none today Consults :ortho CODE STATUS: full DVT Prophylaxis :  lovenox  Status is: Inpatient Remains inpatient appropriate because: TOC for d/c plans to  [facility] on monday    TOTAL TIME TAKING CARE OF THIS PATIENT: 35 minutes.  >50% time spent on counselling and coordination of care  Note: This dictation was prepared with Dragon dictation along with smaller phrase technology. Any transcriptional errors that result from this process are unintentional.  Enedina Finner M.D    [facility]   CC: Primary care physician; Koren Bound, NP

## 2023-05-22 NOTE — TOC Progression Note (Signed)
Transition of Care [facility]) - Progression Note    Patient Details  Name: [name] MRN: [id number] Date of Birth: Oct 25, 1950  Transition of Care [facility]) CM/SW Contact  Hetty Ely, RN Phone Number: 05/22/2023, 4:11 PM  Clinical Narrative:  [facility] Authorization started via phone with Gearldine Bienenstock, documents faxed to [phone]. Pending reference # [id number].     Expected Discharge Plan: Skilled Nursing Facility Barriers to Discharge: Continued Medical Work up  Expected Discharge Plan and Services       Living arrangements for the past 2 months: Group Home                                       Social Determinants of Health (SDOH) Interventions SDOH Screenings   Food Insecurity: Patient Unable To Answer (05/06/2023)  Housing: Patient Unable To Answer (05/19/2023)  Transportation Needs: Patient Unable To Answer (05/19/2023)  Utilities: Patient Unable To Answer (05/06/2023)  Depression (PHQ2-9): Low Risk  (12/12/2020)  Tobacco Use: High Risk (05/18/2023)    Readmission Risk Interventions    05/09/2023    4:34 PM  Readmission Risk Prevention Plan  Transportation Screening Complete  PCP or Specialist Appt within 5-7 Days Complete  Home Care Screening Complete  Medication Review (RN CM) Complete

## 2023-05-23 DIAGNOSIS — I10 Essential (primary) hypertension: Secondary | ICD-10-CM | POA: Diagnosis not present

## 2023-05-23 DIAGNOSIS — S72141A Displaced intertrochanteric fracture of right femur, initial encounter for closed fracture: Secondary | ICD-10-CM | POA: Diagnosis not present

## 2023-05-23 DIAGNOSIS — U071 COVID-19: Secondary | ICD-10-CM | POA: Diagnosis not present

## 2023-05-23 DIAGNOSIS — E785 Hyperlipidemia, unspecified: Secondary | ICD-10-CM | POA: Diagnosis not present

## 2023-05-23 NOTE — Progress Notes (Signed)
[facility]  - [place] at [facility]   PATIENT NAME: [name]    MR#:  [id number]  DATE OF BIRTH:  1951-06-22  SUBJECTIVE:  patient postop hip surgery.  No family at bedside. Overall doing well. No issues per RN.  VITALS:  Blood pressure 117/74, pulse 100, temperature 98.8 F (37.1 C), resp. rate 16, height 5\' 9"  (1.753 m), weight 67.7 kg, SpO2 95%.  PHYSICAL EXAMINATION:   GENERAL:  72 y.o.-year-old patient with no acute distress.  LUNGS: Normal breath sounds bilaterally, no wheezing Hard right armpit swelling appears chronic,mobile and non tender. CARDIOVASCULAR: S1, S2 normal. No murmur   ABDOMEN: Soft, nontender, nondistended. EXTREMITIES: surgical site dressing present    NEUROLOGIC: nonfocal  patient is alert and awake  LABORATORY PANEL:  CBC Recent Labs  Lab 05/20/23 0527  WBC 9.4  HGB 9.6*  HCT 28.9*  PLT 263    Chemistries  Recent Labs  Lab 05/17/23 0446 05/19/23 0511 05/20/23 0527  NA 134*   < > 130*  K 4.0   < > 3.9  CL 100   < > 98  CO2 26   < > 26  GLUCOSE 91   < > 115*  BUN 16   < > 20  CREATININE 0.67   < > 0.65  CALCIUM 8.9   < > 8.9  AST 20  --   --   ALT 15  --   --   ALKPHOS 120  --   --   BILITOT 0.9  --   --    < > = values in this interval not displayed.   RADIOLOGY:  No results found.  Assessment and Plan   [name] is a 72 y.o. male with medical history significant of dementia, bipolar, schizophrenia, hypertension, hyperlipidemia, COPD, diastolic CHF, stroke, hypothyroidism, UTI-ESBL, who presented to the hospital with general weakness.reportedly, he was found sitting on the couch covered in urine-soaked blanket.    Comminuted right hip fracture (intertrochanteric proximal right femur): -- Patient has been evaluated by Dr. Hyacinth Meeker, orthopedic surgeon.   --He wants to delay surgery until patient has had adequate antibiotics for right hip cellulitis.   --Continue Lovenox for DVT prophylaxis. --Follow-up  with Dr. Hyacinth Meeker, orthopedic surgeon. --10/22--Dr Audelia Acton to see pt--for second opinion per [name] request--appreciate input --10/23-- Dr Hyacinth Meeker plans to do surgery next Tuesday. --10/24--skin wound looks better today. Changed to IV cefazolin after d/w ID RPH. Cont abxs till ortho surgery is completed next Tuesday --10/30--assumed care POD #1 sitting in the chair. Doing well --10/31--PT/OT to try work again today --11/1--overall stable --11/2--overall ok. Right armpit hard swelling noted by Rn. Pt tells me he has had it for sometime and non tender and mobile. Will do w/u if it bothers pt --11/3--stable   Right hip cellulitis:  -- Analgesics as needed for pain. --evaluated by WOC--appears to be improved    COVID-19 infection: No respiratory symptoms or hypoxia.     COPD: Compensated.  Continue bronchodilators   Chronic diastolic CHF: Compensated.  2D echo in August 2024 showed EF estimated at 65%, grade 1 diastolic dysfunction.    Hyperthyroidism -Continue home methimazole   Bipolar 1 disorder (HCC) and schizophrenia (HCC): Patient is calm currently -Celexa, risperidone -Patient is not taking fluphenazine currently   BPH (benign prostatic hyperplasia) -Flomax   Procedures:INTRAMEDULLARY (IM) NAIL INTERTROCHANTERIC right hip  SURGEON:  Valinda Hoar Family communication :none today Consults :ortho CODE STATUS: full DVT Prophylaxis :lovenox  Status  is: Inpatient Remains inpatient appropriate because: TOC for d/c plans to  [facility] on monday    TOTAL TIME TAKING CARE OF THIS PATIENT: 35 minutes.  >50% time spent on counselling and coordination of care  Note: This dictation was prepared with Dragon dictation along with smaller phrase technology. Any transcriptional errors that result from this process are unintentional.  Enedina Finner M.D    [facility]   CC: Primary care physician; Koren Bound, NP

## 2023-05-23 NOTE — Progress Notes (Signed)
Physical Therapy Treatment Patient Details Name: [name] MRN: [id number] DOB: 1950/10/26 Today's Date: 05/23/2023   History of Present Illness Patient is a 72 year old male from group home with unwitnessed fall with possible syncopal episode, striking his head. History of hypertension, hyperlipidemia, COPD, bipolar disorder, and schizophrenia. MRI of brain with no acute intracranial pathology. Patient is s/p R hip ORIF. PWB ( 50%)    PT Comments  Pt received in bed and required encouragement for participation in PT session. Pt refused mobility out of the bed due to fear of falling and reports that since he can't walk he does not want to move from the bed. PT session completed with in bed exercises. Pt performed x10 quad sets, SLR, hip abd, and x20 ankle pumps. Pt attempted to perform glute squeezes, however he was not able to recognize how to perform movement with the use of verbal and tactile cues. Pt performed SLR and hip abd AAROM. All exercises were demonstrated and performed x5 on the left leg prior to performing exercise on the right. Verbal and tactile cues were necessary throughout the session to ensure pt was performing exercises appropriately. Pt tolerated Tx fair today and will continue to benefit from skilled PT sessions to improve strength, ROM, functional mobility and activity tolerance following D/C.   If plan is discharge home, recommend the following: Two people to help with walking and/or transfers;A lot of help with bathing/dressing/bathroom   Can travel by private vehicle     No  Equipment Recommendations       Recommendations for Other Services       Precautions / Restrictions Precautions Precautions: Fall Restrictions Weight Bearing Restrictions: Yes RLE Weight Bearing: Partial weight bearing RLE Partial Weight Bearing Percentage or Pounds: 50     Mobility  Bed Mobility               General bed mobility comments: Pt remained in bed for today's  session. Bed mobility not observed.    Transfers                   General transfer comment: deferred    Ambulation/Gait               General Gait Details: deferred   Stairs             Wheelchair Mobility     Tilt Bed    Modified Rankin (Stroke Patients Only)       Balance       Sitting balance - Comments: deferred       Standing balance comment: deferred                            Cognition Arousal: Alert Behavior During Therapy: Flat affect Overall Cognitive Status: No family/caregiver present to determine baseline cognitive functioning Area of Impairment: Attention, Following commands, Problem solving                   Current Attention Level: Sustained Memory: Decreased short-term memory, Decreased recall of precautions Following Commands: Follows one step commands inconsistently, Follows one step commands with increased time     Problem Solving: Slow processing, Decreased initiation, Difficulty sequencing, Requires verbal cues, Requires tactile cues General Comments: With encouragement, pt willing to participate in PT session.        Exercises Other Exercises Other Exercises: Glute squeezes x5- Pt unable to recognize how to contract glutes. Tactile and Verbal  cues used. Other Exercises: Quad Sets x10- Verbal and tactile cues necessary to perform exercise Other Exercises: Hip Abd x10- Pt performed this with AAROM. Verbal and Tactile cues necessary for pt to perform exercise. Other Exercises: SLR x10- Pt performed this with AAROM. Verbal and Tactile cues necessary to perform this exercise. Other Exercises: Ankle Pumps x20- Verbal cues necessary for performing this exercise.    General Comments        Pertinent Vitals/Pain Pain Assessment Pain Assessment: Faces Faces Pain Scale: Hurts a little bit Pain Location: R hip Pain Descriptors / Indicators: Discomfort, Sore, Grimacing Pain Intervention(s):  Monitored during session, Limited activity within patient's tolerance    Home Living                          Prior Function            PT Goals (current goals can now be found in the care plan section) Acute Rehab PT Goals Patient Stated Goal: none stated PT Goal Formulation: Patient unable to participate in goal setting Time For Goal Achievement: 06/02/23 Potential to Achieve Goals: Fair Progress towards PT goals: Progressing toward goals    Frequency    Min 1X/week      PT Plan      Co-evaluation              AM-PAC PT "6 Clicks" Mobility   Outcome Measure  Help needed turning from your back to your side while in a flat bed without using bedrails?: A Lot Help needed moving from lying on your back to sitting on the side of a flat bed without using bedrails?: A Lot Help needed moving to and from a bed to a chair (including a wheelchair)?: A Lot Help needed standing up from a chair using your arms (e.g., wheelchair or bedside chair)?: A Lot Help needed to walk in hospital room?: Total Help needed climbing 3-5 steps with a railing? : Total 6 Click Score: 10    End of Session   Activity Tolerance: Patient tolerated treatment well;Patient limited by pain Patient left: in bed;with call bell/phone within reach;with bed alarm set Nurse Communication: Mobility status PT Visit Diagnosis: Unsteadiness on feet (R26.81);Other abnormalities of gait and mobility (R26.89);History of falling (Z91.81);Difficulty in walking, not elsewhere classified (R26.2);Muscle weakness (generalized) (M62.81);Pain Pain - Right/Left: Right Pain - part of body: Hip     Time: 1335-1350 PT Time Calculation (min) (ACUTE ONLY): 15 min  Charges:    $Therapeutic Exercise: 8-22 mins PT General Charges $$ ACUTE PT VISIT: 1 Visit                     Latavius Capizzi [place] SPT, LAT, ATC   Danaisha Celli [place] 05/23/2023, 2:37 PM

## 2023-05-23 NOTE — Plan of Care (Signed)
  Problem: Education: Goal: Verbalization of understanding the information provided (i.e., activity precautions, restrictions, etc) will improve Outcome: Progressing   Problem: Safety: Goal: Ability to remain free from injury will improve Outcome: Progressing   Problem: Skin Integrity: Goal: Risk for impaired skin integrity will decrease Outcome: Progressing   Problem: Pain Managment: Goal: General experience of comfort will improve Outcome: Progressing   Problem: Elimination: Goal: Will not experience complications related to bowel motility Outcome: Progressing

## 2023-05-23 NOTE — Plan of Care (Signed)
  Problem: Activity: Goal: Risk for activity intolerance will decrease Outcome: Progressing   

## 2023-05-24 DIAGNOSIS — I10 Essential (primary) hypertension: Secondary | ICD-10-CM | POA: Diagnosis not present

## 2023-05-24 DIAGNOSIS — E785 Hyperlipidemia, unspecified: Secondary | ICD-10-CM | POA: Diagnosis not present

## 2023-05-24 DIAGNOSIS — U071 COVID-19: Secondary | ICD-10-CM | POA: Diagnosis not present

## 2023-05-24 DIAGNOSIS — S72141A Displaced intertrochanteric fracture of right femur, initial encounter for closed fracture: Secondary | ICD-10-CM | POA: Diagnosis not present

## 2023-05-24 NOTE — Progress Notes (Signed)
Physical Therapy Treatment Patient Details Name: [name] MRN: [id number] DOB: 03/26/1951 Today's Date: 05/24/2023   History of Present Illness Patient is a 72 year old male from group home with unwitnessed fall with possible syncopal episode, striking his head. History of hypertension, hyperlipidemia, COPD, bipolar disorder, and schizophrenia. MRI of brain with no acute intracranial pathology. Patient is s/p R hip ORIF. PWB ( 50%)    PT Comments  Pt received in Semi-Fowler's position and with encouragement, agreeable to therapy.  Co-tx performed for safety and to assist in easing pt's fear of falling by having extra hands to assist.  Pt at times difficult to understand and slow to respond throughout the session, however followed one-step commands fairly consistent.  Pt able to perform bed mobility with slowed and careful movement of the R LE and requiring some assistance +2 from therapists.  Pt able to come upright with modA +2.  When attempted to side-step, pt did experience buckling of the R LE and that caused him to experience a slowed descent to the bed.  Pt then assisted back in bed with all needs met and call bell within reach.  Nursing notified about having pain medication for the R hip by OT.     If plan is discharge home, recommend the following: Two people to help with walking and/or transfers;A lot of help with bathing/dressing/bathroom   Can travel by private vehicle     No  Equipment Recommendations  Rolling walker (2 wheels)    Recommendations for Other Services       Precautions / Restrictions Restrictions Weight Bearing Restrictions: Yes RLE Weight Bearing: Partial weight bearing RLE Partial Weight Bearing Percentage or Pounds: 50     Mobility  Bed Mobility Overal bed mobility: Needs Assistance Bed Mobility: Supine to Sit, Sit to Supine     Supine to sit: Min assist, HOB elevated, Used rails Sit to supine: Mod assist, +2 for safety/equipment         Transfers Overall transfer level: Needs assistance Equipment used: Rolling walker (2 wheels) Transfers: Sit to/from Stand Sit to Stand: Mod assist, +2 physical assistance, From elevated surface           General transfer comment: lateral side steps attempted, however pt unable to perform without knee buckling.    Ambulation/Gait                   Stairs             Wheelchair Mobility     Tilt Bed    Modified Rankin (Stroke Patients Only)       Balance Overall balance assessment: Needs assistance Sitting-balance support: Feet supported Sitting balance-Leahy Scale: Fair     Standing balance support: Bilateral upper extremity supported, During functional activity, Reliant on assistive device for balance Standing balance-Leahy Scale: Poor Standing balance comment: Pt requires ModA when standing as BLE become weaker when standing for a period of time.                            Cognition Arousal: Alert Behavior During Therapy: Flat affect Overall Cognitive Status: No family/caregiver present to determine baseline cognitive functioning Area of Impairment: Attention, Following commands, Problem solving                   Current Attention Level: Sustained Memory: Decreased short-term memory, Decreased recall of precautions Following Commands: Follows one step commands inconsistently, Follows one step  commands with increased time     Problem Solving: Slow processing, Decreased initiation, Difficulty sequencing, Requires verbal cues, Requires tactile cues General Comments: With encouragement, pt willing to participate in PT session.        Exercises      General Comments        Pertinent Vitals/Pain Pain Assessment Pain Assessment: Faces Faces Pain Scale: Hurts a little bit Pain Intervention(s): Limited activity within patient's tolerance, Monitored during session, Patient requesting pain meds-RN notified, Repositioned     Home Living                          Prior Function            PT Goals (current goals can now be found in the care plan section) Acute Rehab PT Goals Patient Stated Goal: none stated PT Goal Formulation: Patient unable to participate in goal setting Time For Goal Achievement: 06/02/23 Potential to Achieve Goals: Fair Progress towards PT goals: Progressing toward goals    Frequency    7X/week      PT Plan      Co-evaluation PT/OT/SLP Co-Evaluation/Treatment: Yes Reason for Co-Treatment: For patient/therapist safety;To address functional/ADL transfers PT goals addressed during session: Mobility/safety with mobility;Balance;Proper use of DME        AM-PAC PT "6 Clicks" Mobility   Outcome Measure  Help needed turning from your back to your side while in a flat bed without using bedrails?: A Lot Help needed moving from lying on your back to sitting on the side of a flat bed without using bedrails?: A Lot Help needed moving to and from a bed to a chair (including a wheelchair)?: A Lot Help needed standing up from a chair using your arms (e.g., wheelchair or bedside chair)?: A Lot Help needed to walk in hospital room?: Total Help needed climbing 3-5 steps with a railing? : Total 6 Click Score: 10    End of Session Equipment Utilized During Treatment: Gait belt Activity Tolerance: Patient tolerated treatment well;Patient limited by pain Patient left: in bed;with call bell/phone within reach;with bed alarm set Nurse Communication: Mobility status PT Visit Diagnosis: Unsteadiness on feet (R26.81);Other abnormalities of gait and mobility (R26.89);History of falling (Z91.81);Difficulty in walking, not elsewhere classified (R26.2);Muscle weakness (generalized) (M62.81);Pain Pain - Right/Left: Right Pain - part of body: Hip     Time: 1610-9604 PT Time Calculation (min) (ACUTE ONLY): 21 min  Charges:    $Therapeutic Activity: 8-22 mins PT General  Charges $$ ACUTE PT VISIT: 1 Visit                     Nolon Bussing, PT, DPT Physical Therapist - [facility]  05/24/23, 5:14 PM

## 2023-05-24 NOTE — TOC Progression Note (Signed)
Transition of Care [facility]) - Progression Note    Patient Details  Name: [name] MRN: [id number] Date of Birth: [birth year]  [facility]) CM/SW Contact  Garret Reddish, RN Phone Number: 05/24/2023, 8:57 AM  Clinical Narrative:     SNF authorization pending at this time.  Will continue to follow for discharge planning.    Expected Discharge Plan: Skilled Nursing Facility Barriers to Discharge: Continued Medical Work up  Expected Discharge Plan and Services       Living arrangements for the past 2 months: Group Home                                       Social Determinants of Health (SDOH) Interventions SDOH Screenings   Food Insecurity: Patient Unable To Answer (05/06/2023)  Housing: Patient Unable To Answer (05/19/2023)  Transportation Needs: Patient Unable To Answer (05/19/2023)  Utilities: Patient Unable To Answer (05/06/2023)  Depression (PHQ2-9): Low Risk  (12/12/2020)  Tobacco Use: High Risk (05/18/2023)    Readmission Risk Interventions    05/09/2023    4:34 PM  Readmission Risk Prevention Plan  Transportation Screening Complete  PCP or Specialist Appt within 5-7 Days Complete  Home Care Screening Complete  Medication Review (RN CM) Complete

## 2023-05-24 NOTE — Anesthesia Postprocedure Evaluation (Signed)
Anesthesia Post Note  Patient: Bradley Dozier  Procedure(s) Performed: INTRAMEDULLARY (IM) NAIL INTERTROCHANTERIC (Right)  Patient location during evaluation: PACU Anesthesia Type: General Level of consciousness: awake and alert Pain management: pain level controlled Vital Signs Assessment: post-procedure vital signs reviewed and stable Respiratory status: spontaneous breathing, nonlabored ventilation, respiratory function stable and patient connected to nasal cannula oxygen Cardiovascular status: blood pressure returned to baseline and stable Postop Assessment: no apparent nausea or vomiting Anesthetic complications: no   No notable events documented.   Last Vitals:  Vitals:   05/23/23 1758 05/23/23 2113  BP: 122/68 126/69  Pulse: (!) 104 (!) 103  Resp: 15 16  Temp: 37.7 C 37.3 C  SpO2: 98% 97%    Last Pain:  Vitals:   05/23/23 2202  TempSrc:   PainSc: Asleep                 Lenard Simmer

## 2023-05-24 NOTE — Progress Notes (Signed)
The scale on the pt's bed needs to bed zeroed, but the pt refuses to get up. I will pass it along to his day shift RN.

## 2023-05-24 NOTE — Progress Notes (Signed)
[facility]  - [place] [facility]   PATIENT NAME: [name]    MR#:  [id number]  DATE OF BIRTH:  [birth year]  SUBJECTIVE:  patient postop hip surgery.  No family at bedside. Overall doing well. No issues per RN.  VITALS:  Blood pressure 123/66, pulse 97, temperature 98.3 F (36.8 C), resp. rate 14, height 5\' 9"  (1.753 m), weight 67.7 kg, SpO2 94%.  PHYSICAL EXAMINATION:   GENERAL:  72 y.o.-year-old patient with no acute distress.  LUNGS: Normal breath sounds bilaterally, no wheezing Hard right armpit swelling appears chronic,mobile and non tender. CARDIOVASCULAR: S1, S2 normal. No murmur   ABDOMEN: Soft, nontender, nondistended. EXTREMITIES: surgical site dressing present    NEUROLOGIC: nonfocal  patient is alert and awake  LABORATORY PANEL:  CBC Recent Labs  Lab 05/20/23 0527  WBC 9.4  HGB 9.6*  HCT 28.9*  PLT 263    Chemistries  Recent Labs  Lab 05/20/23 0527  NA 130*  K 3.9  CL 98  CO2 26  GLUCOSE 115*  BUN 20  CREATININE 0.65  CALCIUM 8.9   RADIOLOGY:  No results found.  Assessment and Plan   [name] is a 72 y.o. male with medical history significant of dementia, bipolar, schizophrenia, hypertension, hyperlipidemia, COPD, diastolic CHF, stroke, hypothyroidism, UTI-ESBL, who presented to the hospital with general weakness.reportedly, he was found sitting on the couch covered in urine-soaked blanket.    Comminuted right hip fracture (intertrochanteric proximal right femur): -- Patient has been evaluated by Dr. Hyacinth Meeker, orthopedic surgeon.   --He wants to delay surgery until patient has had adequate antibiotics for right hip cellulitis.   --Continue Lovenox for DVT prophylaxis. --Follow-up with Dr. Hyacinth Meeker, orthopedic surgeon. --10/22--Dr Audelia Acton to see pt--for second opinion per [name] request--appreciate input --10/23-- Dr Hyacinth Meeker plans to do surgery next Tuesday. --10/24--skin wound looks better today. Changed to IV  cefazolin after d/w ID RPH. Cont abxs till ortho surgery is completed next Tuesday --10/30--assumed care POD #1 sitting in the chair. Doing well --10/31--PT/OT to try work again today --11/1--overall stable --11/2--overall ok. Right armpit hard swelling noted by Rn. Pt tells me he has had it for sometime and non tender and mobile. Will do w/u if it bothers pt --11/3--stable   Right hip cellulitis:  -- Analgesics as needed for pain. --evaluated by WOC--appears to be improved    COVID-19 infection: No respiratory symptoms or hypoxia.     COPD: Compensated.  Continue bronchodilators   Chronic diastolic CHF: Compensated.  2D echo in August 2024 showed EF estimated at 65%, grade 1 diastolic dysfunction.    Hyperthyroidism -Continue home methimazole   Bipolar 1 disorder (HCC) and schizophrenia (HCC): Patient is calm currently -Celexa, risperidone -Patient is not taking fluphenazine currently   BPH (benign prostatic hyperplasia) -Flomax   Procedures:INTRAMEDULLARY (IM) NAIL INTERTROCHANTERIC right hip  SURGEON:  Valinda Hoar Family communication :none today Consults :ortho CODE STATUS: full DVT Prophylaxis :lovenox  Status is: Inpatient Remains inpatient appropriate because: TOC for d/c plans to  [facility] on monday    TOTAL TIME TAKING CARE OF THIS PATIENT: 35 minutes.  >50% time spent on counselling and coordination of care  Note: This dictation was prepared with Dragon dictation along with smaller phrase technology. Any transcriptional errors that result from this process are unintentional.  Enedina Finner M.D    [facility]   CC: Primary care physician; Koren Bound, NP

## 2023-05-24 NOTE — TOC Progression Note (Signed)
Transition of Care [facility]) - Progression Note    Patient Details  Name: [name] MRN: [id number] Date of Birth: Oct 15, 1950  [facility] CM/SW Contact  Garret Reddish, RN Phone Number: 05/24/2023, 12:17 PM  Clinical Narrative:     SNF authorization still pending.  TOC will continue to follow.    Expected Discharge Plan: Skilled Nursing Facility Barriers to Discharge: Continued Medical Work up  Expected Discharge Plan and Services       Living arrangements for the past 2 months: Group Home                                       Social Determinants of Health (SDOH) Interventions SDOH Screenings   Food Insecurity: Patient Unable To Answer (05/06/2023)  Housing: Patient Unable To Answer (05/19/2023)  Transportation Needs: Patient Unable To Answer (05/19/2023)  Utilities: Patient Unable To Answer (05/06/2023)  Depression (PHQ2-9): Low Risk  (12/12/2020)  Tobacco Use: High Risk (05/18/2023)    Readmission Risk Interventions    05/09/2023    4:34 PM  Readmission Risk Prevention Plan  Transportation Screening Complete  PCP or Specialist Appt within 5-7 Days Complete  Home Care Screening Complete  Medication Review (RN CM) Complete

## 2023-05-24 NOTE — Plan of Care (Signed)
  Problem: Nutrition: Goal: Adequate nutrition will be maintained Outcome: Progressing   Problem: Pain Managment: Goal: General experience of comfort will improve Outcome: Progressing   Problem: Skin Integrity: Goal: Risk for impaired skin integrity will decrease Outcome: Progressing   Problem: Activity: Goal: Ability to ambulate and perform ADLs will improve Outcome: Progressing   Problem: Clinical Measurements: Goal: Postoperative complications will be avoided or minimized Outcome: Progressing

## 2023-05-24 NOTE — TOC Progression Note (Signed)
Transition of Care [facility]) - Progression Note    Patient Details  Name: [name] MRN: [id number] Date of Birth: Jan 27, 1951  Transition of Care [facility]) CM/SW Contact  Garret Reddish, RN Phone Number: 05/24/2023, 3:46 PM  Clinical Narrative:    SNF authorization continues to be being.  Dr. Allena Katz made aware.  TOC will continue to follow for discharge planning.     Expected Discharge Plan: Skilled Nursing Facility Barriers to Discharge: Continued Medical Work up  Expected Discharge Plan and Services       Living arrangements for the past 2 months: Group Home                                       Social Determinants of Health (SDOH) Interventions SDOH Screenings   Food Insecurity: Patient Unable To Answer (05/06/2023)  Housing: Patient Unable To Answer (05/19/2023)  Transportation Needs: Patient Unable To Answer (05/19/2023)  Utilities: Patient Unable To Answer (05/06/2023)  Depression (PHQ2-9): Low Risk  (12/12/2020)  Tobacco Use: High Risk (05/18/2023)    Readmission Risk Interventions    05/09/2023    4:34 PM  Readmission Risk Prevention Plan  Transportation Screening Complete  PCP or Specialist Appt within 5-7 Days Complete  Home Care Screening Complete  Medication Review (RN CM) Complete

## 2023-05-24 NOTE — Progress Notes (Signed)
Occupational Therapy Treatment Patient Details Name: [name] MRN: [id number] DOB: [birth year] Today's Date: 05/24/2023   History of present illness Patient is a 72 year old male from group home with unwitnessed fall with possible syncopal episode, striking his head. History of hypertension, hyperlipidemia, COPD, bipolar disorder, and schizophrenia. MRI of brain with no acute intracranial pathology. Patient is s/p R hip ORIF. PWB ( 50%)   OT comments  Pt seen for OT tx and co-tx with PT to address ADL mobility. Pt endorsing R hip pain but unable to quantify with Likert scale. Pt required some encouragement to participate, pt endorsing fear of falling. With VC for sequencing and initiation of tasks, pt able to complete bed mobility with MOD A +2 for safety and stand with MOD A +2 for physical assist EOB. Attempted but unable to complete side steps due to R knee buckling. Pt returned to bed and required MOD A for log rolling with VC for use of bed rail to assist in order for nursing to assess skin and bandaging. Pt continues to benefit from skilled OT services.       If plan is discharge home, recommend the following:  Two people to help with walking and/or transfers;A lot of help with bathing/dressing/bathroom;Assistance with cooking/housework;Direct supervision/assist for medications management;Assist for transportation;Supervision due to cognitive status;Help with stairs or ramp for entrance;Direct supervision/assist for financial management   Equipment Recommendations  Other (comment) (defer to next venue)    Recommendations for Other Services      Precautions / Restrictions Precautions Precautions: Fall Restrictions Weight Bearing Restrictions: Yes RLE Weight Bearing: Partial weight bearing RLE Partial Weight Bearing Percentage or Pounds: 50       Mobility Bed Mobility Overal bed mobility: Needs Assistance Bed Mobility: Supine to Sit, Sit to Supine, Rolling Rolling: Mod  assist   Supine to sit: Min assist, HOB elevated, Used rails Sit to supine: Mod assist, +2 for safety/equipment   General bed mobility comments: VC for sequencing and initiation. MOD A for rolling side to side for RN to assess skin and bandaging    Transfers Overall transfer level: Needs assistance Equipment used: Rolling walker (2 wheels) Transfers: Sit to/from Stand Sit to Stand: Mod assist, +2 physical assistance, From elevated surface           General transfer comment: lateral side steps attempted, however pt unable to perform without knee buckling.     Balance Overall balance assessment: Needs assistance Sitting-balance support: Feet supported Sitting balance-Leahy Scale: Fair     Standing balance support: Bilateral upper extremity supported, During functional activity, Reliant on assistive device for balance Standing balance-Leahy Scale: Poor                             ADL either performed or assessed with clinical judgement   ADL Overall ADL's : Needs assistance/impaired     Grooming: Supervision/safety;Wash/dry face;Sitting               Lower Body Dressing: Maximal assistance               Functional mobility during ADLs: Rolling walker (2 wheels);+2 for physical assistance;Moderate assistance      Extremity/Trunk Assessment              Vision       Perception     Praxis      Cognition Arousal: Alert Behavior During Therapy: Flat affect Overall Cognitive Status: No family/caregiver  present to determine baseline cognitive functioning                                          Exercises      Shoulder Instructions       General Comments      Pertinent Vitals/ Pain       Pain Assessment Pain Assessment: Faces Faces Pain Scale: Hurts a little bit Pain Location: R hip Pain Descriptors / Indicators: Discomfort, Sore, Grimacing Pain Intervention(s): Limited activity within patient's tolerance,  Monitored during session, Repositioned, Patient requesting pain meds-RN notified  Home Living                                          Prior Functioning/Environment              Frequency  Min 1X/week        Progress Toward Goals  OT Goals(current goals can now be found in the care plan section)  Progress towards OT goals: Progressing toward goals  Acute Rehab OT Goals Patient Stated Goal: get back to bed OT Goal Formulation: With patient Time For Goal Achievement: 06/02/23 Potential to Achieve Goals: Good  Plan      Co-evaluation    PT/OT/SLP Co-Evaluation/Treatment: Yes Reason for Co-Treatment: For patient/therapist safety;To address functional/ADL transfers PT goals addressed during session: Mobility/safety with mobility;Balance;Proper use of DME OT goals addressed during session: ADL's and self-care;Proper use of Adaptive equipment and DME      AM-PAC OT "6 Clicks" Daily Activity     Outcome Measure   Help from another person eating meals?: A Little Help from another person taking care of personal grooming?: A Little Help from another person toileting, which includes using toliet, bedpan, or urinal?: A Lot Help from another person bathing (including washing, rinsing, drying)?: A Lot Help from another person to put on and taking off regular upper body clothing?: A Little Help from another person to put on and taking off regular lower body clothing?: A Lot 6 Click Score: 15    End of Session Equipment Utilized During Treatment: Rolling walker (2 wheels);Gait belt  OT Visit Diagnosis: Unsteadiness on feet (R26.81);Muscle weakness (generalized) (M62.81);Other abnormalities of gait and mobility (R26.89)   Activity Tolerance Patient tolerated treatment well   Patient Left in bed;with call bell/phone within reach;with bed alarm set;with nursing/sitter in room   Nurse Communication Patient requests pain meds;Mobility status        Time:  0272-5366 OT Time Calculation (min): 28 min  Charges: OT General Charges $OT Visit: 1 Visit OT Treatments $Self Care/Home Management : 8-22 mins  Arman Filter., MPH, MS, OTR/L ascom [phone] 05/24/23, 5:39 PM

## 2023-05-24 NOTE — Plan of Care (Signed)
  Problem: Clinical Measurements: Goal: Ability to maintain clinical measurements within normal limits will improve Outcome: Progressing   

## 2023-05-25 DIAGNOSIS — E43 Unspecified severe protein-calorie malnutrition: Secondary | ICD-10-CM | POA: Diagnosis not present

## 2023-05-25 DIAGNOSIS — I10 Essential (primary) hypertension: Secondary | ICD-10-CM | POA: Diagnosis not present

## 2023-05-25 DIAGNOSIS — S72141A Displaced intertrochanteric fracture of right femur, initial encounter for closed fracture: Secondary | ICD-10-CM | POA: Diagnosis not present

## 2023-05-25 DIAGNOSIS — U071 COVID-19: Secondary | ICD-10-CM | POA: Diagnosis not present

## 2023-05-25 MED ORDER — GERHARDT'S BUTT CREAM: 1.0000 | TOPICAL_CREAM | Freq: Three times a day (TID) | CUTANEOUS | 0 refills | Status: DC

## 2023-05-25 MED ORDER — ENSURE ENLIVE PO LIQD: 237.0000 mL | Freq: Three times a day (TID) | ORAL | 12 refills | Status: AC

## 2023-05-25 MED ORDER — FERROUS SULFATE 325 (65 FE) MG PO TABS: 325.0000 mg | ORAL_TABLET | Freq: Every day | ORAL | 3 refills | Status: AC

## 2023-05-25 MED ORDER — HYDROCODONE-ACETAMINOPHEN 5-325 MG PO TABS: 1.0000 | ORAL_TABLET | Freq: Four times a day (QID) | ORAL | 0 refills | Status: DC | PRN

## 2023-05-25 MED ORDER — SENNA 8.6 MG PO TABS: 1.0000 | ORAL_TABLET | Freq: Two times a day (BID) | ORAL | 0 refills | Status: AC

## 2023-05-25 MED ORDER — ASCORBIC ACID 500 MG PO TABS: 500.0000 mg | ORAL_TABLET | Freq: Two times a day (BID) | ORAL | 0 refills | Status: AC

## 2023-05-25 MED ORDER — ADULT MULTIVITAMIN W/MINERALS CH: 1.0000 | ORAL_TABLET | Freq: Every day | ORAL | 0 refills | Status: AC

## 2023-05-25 MED ORDER — MEDIHONEY WOUND/BURN DRESSING EX PSTE: 1.0000 | PASTE | Freq: Every day | CUTANEOUS | 0 refills | Status: DC

## 2023-05-25 NOTE — Care Management Important Message (Signed)
Important Message  Patient Details  Name: [name] MRN: [id number] Date of Birth: 1951-05-06   Important Message Given:  Other (see comment)  Patient is in an isolation room so I called his room [phone]) but there was no answer. I checked with the care manager and she said to contact Jerin Franzel [phone]) but had to leave a voice mail. Will await for a return call.  Olegario Messier A Camrie Stock 05/25/2023, 12:15 PM

## 2023-05-25 NOTE — TOC Transition Note (Signed)
Transition of Care [facility]) - CM/SW Discharge Note   Patient Details  Name: [name] MRN: [id number] Date of Birth: 02/11/1951  Transition of Care [facility]) CM/SW Contact:  Garret Reddish, RN Phone Number: 05/25/2023, 12:12 PM   Clinical Narrative:    Chart reviewed.  Noted that patient has orders for discharge today.  I have received SNF approval.  Plan Auth ID- [id number] Auth ID- [id number] Approved from 05-24-23-05-26-23.  Next review date: 05-26-23.  I have informed [name], Admission Coordinator of [facility] of the above information. [name] reports that the number to call report is [phone].  Patient will be going to bed 62 A.  I have sent Discharge Summary, Discharge orders, and SNF transfer Report [facility] via [facility].   I have informed [name], Group Home Owner, of the above information. I have informed [name] that patient [facility] will transport patient to the facility today.    I have informed staff nurse of the above information.   Final next level of care: Skilled Nursing Facility Barriers to Discharge: No Barriers Identified   Patient Goals and CMS Choice CMS Medicare.gov Compare Post Acute Care list provided to:: Patient Represenative (must comment) [name] ( Group Home Owner)) Choice offered to / list presented to :  [name] [facility] Owner))  Discharge Placement               Patient chooses bed at: [facility] Patient to be transferred to facility by: [facility] Name of family member notified: [name] Patient and family notified of of transfer: 05/25/23  Discharge Plan and Services Additional resources added to the After Visit Summary for                                       Social Determinants of Health (SDOH) Interventions SDOH Screenings   Food Insecurity: Patient Unable To Answer (05/06/2023)  Housing: Patient Unable To Answer (05/19/2023)  Transportation Needs:  Patient Unable To Answer (05/19/2023)  Utilities: Patient Unable To Answer (05/06/2023)  Depression (PHQ2-9): Low Risk  (12/12/2020)  Tobacco Use: High Risk (05/18/2023)     Readmission Risk Interventions    05/09/2023    4:34 PM  Readmission Risk Prevention Plan  Transportation Screening Complete  PCP or Specialist Appt within 5-7 Days Complete  Home Care Screening Complete  Medication Review (RN CM) Complete

## 2023-05-25 NOTE — Discharge Summary (Addendum)
Physician Discharge Summary   Patient: [name] MRN: [id number] DOB: Dec 30, 1950  Admit date:     05/06/2023  Discharge date: 05/25/23  Discharge Physician: Enedina Finner   PCP: Koren Bound, NP   Recommendations at discharge:    F/u Dr Hyacinth Meeker Ortho in 10-12 days F/u PCP in 1-2 weeks  Discharge Diagnoses: Principal Problem:   Closed displaced intertrochanteric fracture of right femur [facility]) Active Problems:   COVID-19 virus infection   COPD (chronic obstructive pulmonary disease) (HCC)   Cellulitis of right hip   Essential hypertension   Hyperlipidemia   Chronic diastolic CHF (congestive heart failure) (HCC)   Hyperthyroidism   Bipolar 1 disorder (HCC)   Schizophrenia (HCC)   BPH (benign prostatic hyperplasia)   Smoker   Protein-calorie malnutrition, severe   Pressure injury of skin   [name] is a 72 y.o. male with medical history significant of dementia, bipolar, schizophrenia, hypertension, hyperlipidemia, COPD, diastolic CHF, stroke, hypothyroidism, UTI-ESBL, who presented to the hospital with general weakness.reportedly, he was found sitting on the couch covered in urine-soaked blanket.    Comminuted right hip fracture (intertrochanteric proximal right femur): -- Patient has been evaluated by Dr. Hyacinth Meeker, orthopedic surgeon.   --He wants to delay surgery until patient has had adequate antibiotics for right hip cellulitis. --recieved Lovenox for DVT prophylaxis while in the hospital --Follow-up with Dr. Hyacinth Meeker, orthopedic surgeon.  Right hip cellulitis:  -- Analgesics as needed for pain. --evaluated by [facility]--appears to be improved    COVID-19 infection: No respiratory symptoms or hypoxia.      COPD: Compensated.  Continue bronchodilators   Chronic diastolic CHF: Compensated.  2D echo in August 2024 showed EF estimated at 65%, grade 1 diastolic dysfunction.    Hyperthyroidism -Continue home methimazole   Bipolar 1 disorder (HCC) and schizophrenia (HCC):  Patient is calm currently -Celexa, risperidone -Patient is not taking fluphenazine currently   BPH (benign prostatic hyperplasia) -Flomax  Nutrition Status: Nutrition Problem: Severe Malnutrition Etiology: chronic illness (dementia) Signs/Symptoms: moderate fat depletion, severe fat depletion, moderate muscle depletion, severe muscle depletion, percent weight loss Interventions: Ensure Enlive (each supplement provides 350kcal and 20 grams of protein), MVI, Liberalize Diet       Procedures:INTRAMEDULLARY (IM) NAIL INTERTROCHANTERIC right hip  SURGEON:  Valinda Hoar Family communication :none today Consults :ortho CODE STATUS: full DVT Prophylaxis :got lovenox while in the hospital. Jonne Ply listed as allergy. Recommend ambulation/TEDs at discharge     Pain control - [facility] Controlled Substance Reporting System database was reviewed. and patient was instructed, not to drive, operate heavy machinery, perform activities at heights, swimming or participation in water activities or provide baby-sitting services while on Pain, Sleep and Anxiety Medications; until their outpatient Physician has advised to do so again. Also recommended to not to take more than prescribed Pain, Sleep and Anxiety Medications.  Disposition: Rehabilitation facility Diet recommendation:  Discharge Diet Orders (From admission, onward)     Start     Ordered   05/25/23 0000  Diet - low sodium heart healthy        05/25/23 1130           Cardiac diet DISCHARGE MEDICATION: Allergies as of 05/25/2023       Reactions   Asa [aspirin]    Hx GI bleed        Medication List     STOP taking these medications    fluPHENAZine 5 MG tablet Commonly known as: PROLIXIN       TAKE  these medications    ascorbic acid 500 MG tablet Commonly known as: VITAMIN C Take 1 tablet (500 mg total) by mouth 2 (two) times daily.   citalopram 40 MG tablet Commonly known as: CELEXA Take 1 tablet (40 mg  total) by mouth daily.   feeding supplement Liqd Take 237 mLs by mouth 3 (three) times daily between meals.   ferrous sulfate 325 (65 FE) MG tablet Take 1 tablet (325 mg total) by mouth daily with breakfast. Start taking on: May 26, 2023   Gerhardt's butt cream Crea Apply 1 Application topically 3 (three) times daily.   HYDROcodone-acetaminophen 5-325 MG tablet Commonly known as: NORCO/VICODIN Take 1-2 tablets by mouth every 6 (six) hours as needed for moderate pain (pain score 4-6).   leptospermum manuka honey Pste paste Apply 1 Application topically daily.   methimazole 5 MG tablet Commonly known as: TAPAZOLE Take 5 mg by mouth 3 (three) times daily.   multivitamin with minerals Tabs tablet Take 1 tablet by mouth daily.   risperiDONE 1 MG tablet Commonly known as: RISPERDAL Take 1 tablet (1 mg total) by mouth daily.   senna 8.6 MG Tabs tablet Commonly known as: SENOKOT Take 1 tablet (8.6 mg total) by mouth 2 (two) times daily.   simvastatin 20 MG tablet Commonly known as: ZOCOR TAKE 1 TABLET BY MOUTH DAILY   tamsulosin 0.4 MG Caps capsule Commonly known as: FLOMAX TAKE 1 CAPSULE BY MOUTH DAILY AFTER SUPPER   Vitamin D3 25 MCG (1000 UT) Caps Take 1 capsule (1,000 Units total) by mouth daily.               Discharge Care Instructions  (From admission, onward)           Start     Ordered   05/25/23 0000  Discharge wound care:       Comments: 05/18/23 1326    Reinforce dressing  Until discontinued      Comments: As needed  05/18/23 1325   05/08/23 2200    Wound care  Daily      Comments: Clean R hip wounds and R buttock wounds with NS, apply Medihoney to wound beds daily, cover with dry gauze. Cover both R hip wounds and buttock wounds with ABD pad after applying Medihoney. Avoid silicone foam as per bedside nurse tearing skin with removal.   05/25/23 1130            Follow-up Information     Koren Bound, NP. Schedule an appointment  as soon as possible for a visit in 1 week(s).   Specialty: Nurse Practitioner Contact information: [address] [place] [address] [phone]         Deeann Saint, MD. Schedule an appointment as soon as possible for a visit in 10 day(s).   Specialty: Orthopedic Surgery Why: post hip surgery f/u Contact information: [address] [place] [address] [phone]                Discharge Exam: Ceasar Mons Weights   05/21/23 0500 05/22/23 0500 05/25/23 0451  Weight: 74.5 kg 67.7 kg 71.4 kg   GENERAL:  72 y.o.-year-old patient with no acute distress.  LUNGS: Normal breath sounds bilaterally, no wheezing Hard right armpit swelling appears chronic,mobile and non tender. CARDIOVASCULAR: S1, S2 normal. No murmur   ABDOMEN: Soft, nontender, nondistended. EXTREMITIES: surgical site dressing present    NEUROLOGIC: nonfocal  patient is alert and awake Pressure Injury 05/06/23 Coccyx Mid Stage 2 -  Partial thickness loss of dermis presenting as a shallow open injury with a red, pink wound bed without slough. (Active)  05/06/23 2330  Location: Coccyx  Location Orientation: Mid  Staging: Stage 2 -  Partial thickness loss of dermis presenting as a shallow open injury with a red, pink wound bed without slough.  Wound Description (Comments):   Present on Admission: Yes     Condition at discharge: fair  The results of significant diagnostics from this hospitalization (including imaging, microbiology, ancillary and laboratory) are listed below for reference.   Imaging Studies: DG HIP UNILAT WITH PELVIS 2-3 VIEWS RIGHT  Result Date: 05/18/2023 CLINICAL DATA:  Right femoral intramedullary nail surgery. Intraoperative fluoroscopy. EXAM: DG HIP (WITH OR WITHOUT PELVIS) 2-3V RIGHT COMPARISON:  CT right hip 05/07/2023 FINDINGS: Images were performed intraoperatively without the presence of a radiologist. On the first two images, intertrochanteric fracture of the  proximal femur is again visualized. The patient is undergoing placement of a right cephalomedullary nail fixating the fracture. Total fluoroscopy images: 5 Total fluoroscopy time: 73 seconds Total dose: Radiation Exposure Index (as provided by the fluoroscopic device): 12.9 mGy air Kerma Please see intraoperative findings for further detail. IMPRESSION: Intraoperative fluoroscopy for right femoral intramedullary nail surgery. Electronically Signed   By: Neita Garnet M.D.   On: 05/18/2023 13:18   DG C-Arm 1-60 Min-No Report  Result Date: 05/18/2023 Fluoroscopy was utilized by the requesting physician.  No radiographic interpretation.   DG C-Arm 1-60 Min-No Report  Result Date: 05/18/2023 Fluoroscopy was utilized by the requesting physician.  No radiographic interpretation.   CT HIP RIGHT W CONTRAST  Result Date: 05/07/2023 CLINICAL DATA:  Osteomyelitis, hip Soft tissue infection suspected, hip, no prior imaging EXAM: CT OF THE LOWER RIGHT EXTREMITY WITH CONTRAST TECHNIQUE: Multidetector CT imaging of the lower right extremity was performed according to the standard protocol following intravenous contrast administration. RADIATION DOSE REDUCTION: This exam was performed according to the departmental dose-optimization program which includes automated exposure control, adjustment of the mA and/or kV according to patient size and/or use of iterative reconstruction technique. CONTRAST:  OMNIPAQUE IOHEXOL 300 MG/ML  SOLN COMPARISON:  CT 03/18/2023 FINDINGS: Bones/Joint/Cartilage Acute, comminuted intertrochanteric fracture of the proximal right femur with mild displacement and varus angulation. Hip joint alignment is maintained without dislocation. Mild right hip joint space narrowing. No additional fractures. No lytic or sclerotic bone lesion. Ligaments Suboptimally assessed by CT. Muscles and Tendons No acute musculotendinous abnormality by CT. Soft tissues Soft tissue swelling about the right hip.  No organized fluid collection or hematoma. No right inguinal lymphadenopathy. Atherosclerotic vascular calcifications. IMPRESSION: 1. Acute, comminuted intertrochanteric fracture of the proximal right femur with mild displacement and varus angulation. 2. Soft tissue swelling about the right hip. No organized fluid collection or hematoma. These results will be called to the ordering clinician or representative by the Radiologist Assistant, and communication documented in the PACS or [employer]. Electronically Signed   By: Duanne Guess D.O.   On: 05/07/2023 21:18   DG Chest Port 1 View  Result Date: 05/07/2023 CLINICAL DATA:  COVID-19 viral infection. EXAM: PORTABLE CHEST 1 VIEW COMPARISON:  AP Lat chest 03/04/2023 FINDINGS: There is mild cardiomegaly.  Central vessels are normal caliber. The aorta is tortuous with heavy calcification. Stable mediastinum. The lungs are mildly emphysematous. There is hazy opacity in the right lung base, which could be asymmetric chest wall attenuation or pneumonitis. The remaining lungs are radiographically clear. There is thoracic spondylosis. Follow-up PA and  lateral views recommended. IMPRESSION: 1. Hazy opacity in the right lung base, which could be asymmetric chest wall attenuation or pneumonitis. Follow-up PA and lateral views recommended. 2. Mild cardiomegaly. 3. Aortic atherosclerosis. Electronically Signed   By: Almira Bar M.D.   On: 05/07/2023 03:50   CT Head Wo Contrast  Result Date: 05/06/2023 CLINICAL DATA:  Mental status change, unknown cause EXAM: CT HEAD WITHOUT CONTRAST TECHNIQUE: Contiguous axial images were obtained from the base of the skull through the vertex without intravenous contrast. RADIATION DOSE REDUCTION: This exam was performed according to the departmental dose-optimization program which includes automated exposure control, adjustment of the mA and/or kV according to patient size and/or use of iterative reconstruction technique.  COMPARISON:  MRI head 03/05/2023.  CT head 03/04/2023. FINDINGS: Brain: No evidence of acute infarction, hemorrhage, hydrocephalus, extra-axial collection or mass lesion/mass effect. Similar chronic microvascular ischemic disease and remote infarcts in the right basal ganglia, pons, and white matter. Vascular: Calcific atherosclerosis. No hyperdense vessel identified. Skull: No acute fracture. Sinuses/Orbits: Mild paranasal sinus mucosal thickening. No acute orbital findings. Other: No mastoid effusions. IMPRESSION: 1. No evidence of acute intracranial abnormality. 2. Chronic microvascular ischemic disease. Electronically Signed   By: Feliberto Harts M.D.   On: 05/06/2023 12:48    Microbiology: Results for orders placed or performed during the hospital encounter of 05/06/23  Urine Culture     Status: None   Collection Time: 05/06/23  6:58 PM   Specimen: Urine, Random  Result Value Ref Range Status   Specimen Description   Final    URINE, RANDOM Performed at [facility], [address], [place], [place] [address]    Special Requests   Final    NONE Reflexed from [id number] Performed at [facility], [address], [place], [place] [address]    Culture   Final    NO GROWTH Performed at [facility], [address] [place], [place] [address]    Report Status 05/07/2023 FINAL  Final  Resp panel by RT-PCR (RSV, Flu A&B, Covid) Anterior Nasal Swab     Status: Abnormal   Collection Time: 05/06/23  8:12 PM   Specimen: Anterior Nasal Swab  Result Value Ref Range Status   SARS Coronavirus 2 by RT PCR POSITIVE (A) NEGATIVE Final    Comment: (NOTE) SARS-CoV-2 target nucleic acids are DETECTED.  The SARS-CoV-2 RNA is generally detectable in upper respiratory specimens during the acute phase of infection. Positive results are indicative of the presence of the identified virus, but do not rule out bacterial infection or co-infection with other pathogens not detected by  the test. Clinical correlation with patient history and other diagnostic information is necessary to determine patient infection status. The expected result is Negative.  Fact Sheet for Patients: BloggerCourse.com  Fact Sheet for Healthcare Providers: SeriousBroker.it  This test is not yet approved or cleared by the [place] FDA and  has been authorized for detection and/or diagnosis of SARS-CoV-2 by FDA under an Emergency Use Authorization (EUA).  This EUA will remain in effect (meaning this test can be used) for the duration of  the COVID-19 declaration under Section 564(b)(1) of the A ct, 21 U.S.C. section 360bbb-3(b)(1), unless the authorization is terminated or revoked sooner.     Influenza A by PCR NEGATIVE NEGATIVE Final   Influenza B by PCR NEGATIVE NEGATIVE Final    Comment: (NOTE) The Xpert Xpress SARS-CoV-2/FLU/RSV plus assay is intended as an aid in the diagnosis of influenza  from Nasopharyngeal swab specimens and should not be used as a sole basis for treatment. Nasal washings and aspirates are unacceptable for Xpert Xpress SARS-CoV-2/FLU/RSV testing.  Fact Sheet for Patients: BloggerCourse.com  Fact Sheet for Healthcare Providers: SeriousBroker.it  This test is not yet approved or cleared by the [place] FDA and has been authorized for detection and/or diagnosis of SARS-CoV-2 by FDA under an Emergency Use Authorization (EUA). This EUA will remain in effect (meaning this test can be used) for the duration of the COVID-19 declaration under Section 564(b)(1) of the Act, 21 U.S.C. section 360bbb-3(b)(1), unless the authorization is terminated or revoked.     Resp Syncytial Virus by PCR NEGATIVE NEGATIVE Final    Comment: (NOTE) Fact Sheet for Patients: BloggerCourse.com  Fact Sheet for Healthcare  Providers: SeriousBroker.it  This test is not yet approved or cleared by the [place] FDA and has been authorized for detection and/or diagnosis of SARS-CoV-2 by FDA under an Emergency Use Authorization (EUA). This EUA will remain in effect (meaning this test can be used) for the duration of the COVID-19 declaration under Section 564(b)(1) of the Act, 21 U.S.C. section 360bbb-3(b)(1), unless the authorization is terminated or revoked.  Performed at [facility], [address] [place], [place] [address]   Culture, blood (x 2)     Status: None   Collection Time: 05/06/23  9:47 PM   Specimen: BLOOD  Result Value Ref Range Status   Specimen Description BLOOD BLOOD RIGHT ARM  Final   Special Requests   Final    BOTTLES DRAWN AEROBIC AND ANAEROBIC Blood Culture adequate volume   Culture   Final    NO GROWTH 5 DAYS Performed at [facility], [address] [place], [place] [address]    Report Status 05/11/2023 FINAL  Final  Culture, blood (x 2)     Status: None   Collection Time: 05/06/23  9:47 PM   Specimen: BLOOD  Result Value Ref Range Status   Specimen Description BLOOD BLOOD LEFT ARM  Final   Special Requests   Final    BOTTLES DRAWN AEROBIC AND ANAEROBIC Blood Culture adequate volume   Culture   Final    NO GROWTH 5 DAYS Performed at [facility], [address] [place], [place] [address]    Report Status 05/11/2023 FINAL  Final    Labs: CBC: Recent Labs  Lab 05/19/23 0511 05/20/23 0527  WBC 11.0* 9.4  HGB 9.8* 9.6*  HCT 30.4* 28.9*  MCV 87.1 86.0  PLT 279 263   Basic Metabolic Panel: Recent Labs  Lab 05/19/23 0511 05/20/23 0527  NA 130* 130*  K 4.2 3.9  CL 99 98  CO2 25 26  GLUCOSE 101* 115*  BUN 17 20  CREATININE 0.59* 0.65  CALCIUM 8.7* 8.9    Discharge time spent: greater than 30 minutes.  Signed: Enedina Finner, MD [facility] 05/25/2023

## 2023-05-25 NOTE — Progress Notes (Signed)
Physical Therapy Treatment Patient Details Name: [name] MRN: [id number] DOB: 10-15-1950 Today's Date: 05/25/2023   History of Present Illness Patient is a 72 year old male from group home with unwitnessed fall with possible syncopal episode, striking his head. History of hypertension, hyperlipidemia, COPD, bipolar disorder, and schizophrenia. MRI of brain with no acute intracranial pathology. Patient is s/p R hip ORIF. PWB ( 50%)    PT Comments  Pt received in supine position and agreeable to therapy.  Pt continues to have flat affect and difficulty communicating at times.  Pt ultimately deferred any bed mobility at this time, however was interested in performing bed level exercises with maximal encouragement.  Pt performed the exercises listed below.  Pt unable to complete SLR without AAROM, however all the other exercises pt was able to perform without complication.  Pt left with all needs met and MD notified of the pt's toenail growth that may also be limiting mobility/standing due to pain in the feet.  Pt left with all needs met and call bell within reach.       If plan is discharge home, recommend the following: Two people to help with walking and/or transfers;A lot of help with bathing/dressing/bathroom   Can travel by private vehicle        Equipment Recommendations  Rolling walker (2 wheels)    Recommendations for Other Services       Precautions / Restrictions Precautions Precautions: Fall Restrictions Weight Bearing Restrictions: Yes RLE Weight Bearing: Partial weight bearing RLE Partial Weight Bearing Percentage or Pounds: 50     Mobility  Bed Mobility               General bed mobility comments: pt ultimately deferred mobility at this time.    Transfers                        Ambulation/Gait                   Stairs             Wheelchair Mobility     Tilt Bed    Modified Rankin (Stroke Patients Only)        Balance Overall balance assessment: Needs assistance Sitting-balance support: Feet supported Sitting balance-Leahy Scale: Fair     Standing balance support: Bilateral upper extremity supported, During functional activity, Reliant on assistive device for balance Standing balance-Leahy Scale: Poor                              Cognition Arousal: Alert Behavior During Therapy: Flat affect Overall Cognitive Status: No family/caregiver present to determine baseline cognitive functioning                                          Exercises General Exercises - Lower Extremity Ankle Circles/Pumps: AROM, Strengthening, Both, 20 reps, Supine Quad Sets: AROM, Strengthening, Both, 10 reps, Supine Gluteal Sets: AROM, Strengthening, Both, 5 reps, Supine Heel Slides: AROM, Strengthening, Left, 5 reps, Supine Hip ABduction/ADduction: AROM, Strengthening, Both, 10 reps, Supine Straight Leg Raises: AROM, Left, AAROM, Right, Strengthening, 10 reps, Supine    General Comments        Pertinent Vitals/Pain Pain Assessment Pain Assessment: Faces Faces Pain Scale: Hurts a little bit Pain Location: R hip Pain Descriptors / Indicators:  Discomfort, Sore, Grimacing Pain Intervention(s): Limited activity within patient's tolerance, Monitored during session, Repositioned    Home Living                          Prior Function            PT Goals (current goals can now be found in the care plan section) Acute Rehab PT Goals Patient Stated Goal: none stated PT Goal Formulation: Patient unable to participate in goal setting Time For Goal Achievement: 06/02/23 Potential to Achieve Goals: Fair Progress towards PT goals: Progressing toward goals    Frequency    7X/week      PT Plan      Co-evaluation PT/OT/SLP Co-Evaluation/Treatment: Yes Reason for Co-Treatment: For patient/therapist safety;To address functional/ADL transfers PT goals addressed  during session: Mobility/safety with mobility;Balance;Proper use of DME        AM-PAC PT "6 Clicks" Mobility   Outcome Measure  Help needed turning from your back to your side while in a flat bed without using bedrails?: A Lot Help needed moving from lying on your back to sitting on the side of a flat bed without using bedrails?: A Lot Help needed moving to and from a bed to a chair (including a wheelchair)?: A Lot Help needed standing up from a chair using your arms (e.g., wheelchair or bedside chair)?: A Lot Help needed to walk in hospital room?: Total Help needed climbing 3-5 steps with a railing? : Total 6 Click Score: 10    End of Session   Activity Tolerance: Patient tolerated treatment well;Patient limited by pain Patient left: in bed;with call bell/phone within reach;with bed alarm set Nurse Communication: Mobility status PT Visit Diagnosis: Unsteadiness on feet (R26.81);Other abnormalities of gait and mobility (R26.89);History of falling (Z91.81);Difficulty in walking, not elsewhere classified (R26.2);Muscle weakness (generalized) (M62.81);Pain Pain - Right/Left: Right Pain - part of body: Hip     Time: 1235-1244 PT Time Calculation (min) (ACUTE ONLY): 9 min  Charges:    $Therapeutic Exercise: 8-22 mins PT General Charges $$ ACUTE PT VISIT: 1 Visit                     Nolon Bussing, PT, DPT Physical Therapist - [facility]  05/25/23, 1:57 PM

## 2023-05-25 NOTE — Progress Notes (Signed)
This RN notified patient's emergency contact via voicemail, Fintan Grater, of patient's discharge to [facility] and room number.

## 2023-05-25 NOTE — Progress Notes (Signed)
Nutrition Follow-up  DOCUMENTATION CODES:   Severe malnutrition in context of chronic illness  INTERVENTION:   -Continue dysphagia 3 diet  for ease of intake -Continue feeding assistance with meals -Continue Ensure Enlive po TID, each supplement provides 350 kcal and 20 grams of protein -Continue MVI with minerals daily -Continue 500 mg vitamin C BID -Continue 220 mg zinc sulfate daily x 14 days -Continue Magic cup TID with meals, each supplement provides 290 kcal and 9 grams of protein   NUTRITION DIAGNOSIS:   Severe Malnutrition related to chronic illness (dementia) as evidenced by moderate fat depletion, severe fat depletion, moderate muscle depletion, severe muscle depletion, percent weight loss.  Ongoing  GOAL:   Patient will meet greater than or equal to 90% of their needs  Progressing   MONITOR:   PO intake, Supplement acceptance  REASON FOR ASSESSMENT:   Malnutrition Screening Tool    ASSESSMENT:   Pt with medical history significant of dementia, bipolar, schizophrenia, hypertension, hyperlipidemia, COPD, diastolic CHF, stroke, hypothyroidism, UTI-ESBL, who presents with weakness.  10/19- per ortho pt with rt intertochanteric hip fracture with overlying soft tissue cellulitis and infection  10/29- s/p INTRAMEDULLARY (IM) NAIL INTERTROCHANTERIC right hip  Reviewed I/O's: -300 ml x 24 hours and -5.5 L since 05/11/23  UOP: 300 ml x 24 hours   Per [facility] notes, pt with stage 3 pressure injury to rt buttock, MASD to sacrum/ coccyx/ buttocks, and full thickness wound to rt hip.   Pt receiving personal care at time of visit.   Pt with improved oral intake. Noted meal completions 90-100%. Pt is drinking supplements.   Wt has been stable over the past week.   Per MD, plan to d/c to SNF today.   Medications reviewed and include vitamin C, vitamin D, and ferrous sulfate.   Labs reviewed: Na: 130, CBGS: 90 (inpatient orders for glycemic control are none).     Diet Order:   Diet Order             Diet - low sodium heart healthy           DIET DYS 3 Fluid consistency: Thin  Diet effective now                   EDUCATION NEEDS:   No education needs have been identified at this time  Skin:  Skin Assessment: Skin Integrity Issues: Skin Integrity Issues:: Other (Comment), Stage III Stage II: - Stage III: rt buttock Other: full thickness wound to rt hip, MASD to sacrum/ coccyx/ buttocks  Last BM:  05/17/23  Height:   Ht Readings from Last 1 Encounters:  05/18/23 5\' 9"  (1.753 m)    Weight:   Wt Readings from Last 1 Encounters:  05/25/23 71.4 kg    Ideal Body Weight:  72.7 kg  BMI:  Body mass index is 23.25 kg/m.  Estimated Nutritional Needs:   Kcal:  2200-2300  Protein:  110-125 grams  Fluid:  > 2 L    Levada Schilling, RD, LDN, CDCES Registered Dietitian III Certified Diabetes Care and Education Specialist Please refer to [facility] for RD and/or RD on-call/weekend/after hours pager

## 2023-05-25 NOTE — Plan of Care (Signed)
  Problem: Education: Goal: Knowledge of General Education information will improve Description: Including pain rating scale, medication(s)/side effects and non-pharmacologic comfort measures Outcome: Progressing   Problem: Health Behavior/Discharge Planning: Goal: Ability to manage health-related needs will improve Outcome: Progressing   Problem: Clinical Measurements: Goal: Ability to maintain clinical measurements within normal limits will improve Outcome: Progressing Goal: Will remain free from infection Outcome: Progressing Goal: Diagnostic test results will improve Outcome: Progressing Goal: Respiratory complications will improve Outcome: Progressing Goal: Cardiovascular complication will be avoided Outcome: Progressing   Problem: Activity: Goal: Risk for activity intolerance will decrease Outcome: Progressing   Problem: Nutrition: Goal: Adequate nutrition will be maintained Outcome: Progressing   Problem: Coping: Goal: Level of anxiety will decrease Outcome: Progressing   Problem: Elimination: Goal: Will not experience complications related to bowel motility Outcome: Progressing Goal: Will not experience complications related to urinary retention Outcome: Progressing   Problem: Pain Managment: Goal: General experience of comfort will improve Outcome: Progressing   Problem: Safety: Goal: Ability to remain free from injury will improve Outcome: Progressing   Problem: Skin Integrity: Goal: Risk for impaired skin integrity will decrease Outcome: Progressing   Problem: Education: Goal: Verbalization of understanding the information provided (i.e., activity precautions, restrictions, etc) will improve Outcome: Progressing Goal: Individualized Educational Video(s) Outcome: Progressing   Problem: Activity: Goal: Ability to ambulate and perform ADLs will improve Outcome: Progressing   Problem: Clinical Measurements: Goal: Postoperative complications will be  avoided or minimized Outcome: Progressing   Problem: Self-Concept: Goal: Ability to maintain and perform role responsibilities to the fullest extent possible will improve Outcome: Progressing   Problem: Pain Management: Goal: Pain level will decrease Outcome: Progressing   Problem: Education: Goal: Knowledge of risk factors and measures for prevention of condition will improve Outcome: Progressing   Problem: Coping: Goal: Psychosocial and spiritual needs will be supported Outcome: Progressing   Problem: Respiratory: Goal: Will maintain a patent airway Outcome: Progressing Goal: Complications related to the disease process, condition or treatment will be avoided or minimized Outcome: Progressing

## 2023-05-25 NOTE — Plan of Care (Signed)
  Problem: Education: Goal: Knowledge of General Education information will improve Description: Including pain rating scale, medication(s)/side effects and non-pharmacologic comfort measures Outcome: Adequate for Discharge   Problem: Health Behavior/Discharge Planning: Goal: Ability to manage health-related needs will improve Outcome: Adequate for Discharge   Problem: Clinical Measurements: Goal: Ability to maintain clinical measurements within normal limits will improve Outcome: Adequate for Discharge Goal: Will remain free from infection Outcome: Adequate for Discharge Goal: Diagnostic test results will improve Outcome: Adequate for Discharge Goal: Respiratory complications will improve Outcome: Adequate for Discharge Goal: Cardiovascular complication will be avoided Outcome: Adequate for Discharge   Problem: Activity: Goal: Risk for activity intolerance will decrease Outcome: Adequate for Discharge   Problem: Nutrition: Goal: Adequate nutrition will be maintained Outcome: Adequate for Discharge   Problem: Coping: Goal: Level of anxiety will decrease Outcome: Adequate for Discharge   Problem: Elimination: Goal: Will not experience complications related to bowel motility Outcome: Adequate for Discharge Goal: Will not experience complications related to urinary retention Outcome: Adequate for Discharge   Problem: Pain Managment: Goal: General experience of comfort will improve Outcome: Adequate for Discharge   Problem: Safety: Goal: Ability to remain free from injury will improve Outcome: Adequate for Discharge   Problem: Skin Integrity: Goal: Risk for impaired skin integrity will decrease Outcome: Adequate for Discharge   Problem: Education: Goal: Verbalization of understanding the information provided (i.e., activity precautions, restrictions, etc) will improve Outcome: Adequate for Discharge Goal: Individualized Educational Video(s) Outcome: Adequate for  Discharge   Problem: Activity: Goal: Ability to ambulate and perform ADLs will improve Outcome: Adequate for Discharge   Problem: Clinical Measurements: Goal: Postoperative complications will be avoided or minimized Outcome: Adequate for Discharge   Problem: Self-Concept: Goal: Ability to maintain and perform role responsibilities to the fullest extent possible will improve Outcome: Adequate for Discharge   Problem: Pain Management: Goal: Pain level will decrease Outcome: Adequate for Discharge   Problem: Education: Goal: Knowledge of risk factors and measures for prevention of condition will improve Outcome: Adequate for Discharge   Problem: Coping: Goal: Psychosocial and spiritual needs will be supported Outcome: Adequate for Discharge   Problem: Respiratory: Goal: Will maintain a patent airway Outcome: Adequate for Discharge Goal: Complications related to the disease process, condition or treatment will be avoided or minimized Outcome: Adequate for Discharge

## 2023-05-25 NOTE — Progress Notes (Signed)
Report called to Crystal, LPN at SNF. AVS and signed Norco script in packet. IV removed; dressings changed per order. Awaiting EMS

## 2023-05-25 NOTE — TOC Progression Note (Signed)
Transition of Care [facility]) - Progression Note    Patient Details  Name: [name] MRN: [id number] Date of Birth: 1950/10/02  [facility] CM/SW Contact  Garret Reddish, RN Phone Number: 05/25/2023, 8:58 AM  Clinical Narrative:     Chart reviewed.  Request for additional clinical requested by [facility].  Additional Clinical information submitted as requested.  TOC will continue to follow for discharge planning.     Expected Discharge Plan: Skilled Nursing Facility Barriers to Discharge: Continued Medical Work up  Expected Discharge Plan and Services       Living arrangements for the past 2 months: Group Home                                       Social Determinants of Health (SDOH) Interventions SDOH Screenings   Food Insecurity: Patient Unable To Answer (05/06/2023)  Housing: Patient Unable To Answer (05/19/2023)  Transportation Needs: Patient Unable To Answer (05/19/2023)  Utilities: Patient Unable To Answer (05/06/2023)  Depression (PHQ2-9): Low Risk  (12/12/2020)  Tobacco Use: High Risk (05/18/2023)    Readmission Risk Interventions    05/09/2023    4:34 PM  Readmission Risk Prevention Plan  Transportation Screening Complete  PCP or Specialist Appt within 5-7 Days Complete  Home Care Screening Complete  Medication Review (RN CM) Complete

## 2023-08-20 ENCOUNTER — Emergency Department: Payer: Medicare HMO

## 2023-08-20 ENCOUNTER — Other Ambulatory Visit: Payer: Self-pay

## 2023-08-20 ENCOUNTER — Inpatient Hospital Stay
Admission: EM | Admit: 2023-08-20 | Discharge: 2023-09-01 | DRG: 871 | Disposition: A | Discharge status: Skilled Nursing Facility | Payer: Medicare HMO | Attending: Internal Medicine | Admitting: Internal Medicine

## 2023-08-20 DIAGNOSIS — E872 Acidosis, unspecified: Secondary | ICD-10-CM | POA: Diagnosis present

## 2023-08-20 DIAGNOSIS — J690 Pneumonitis due to inhalation of food and vomit: Secondary | ICD-10-CM

## 2023-08-20 DIAGNOSIS — A4151 Sepsis due to Escherichia coli [E. coli]: Principal | ICD-10-CM | POA: Diagnosis present

## 2023-08-20 DIAGNOSIS — M4646 Discitis, unspecified, lumbar region: Secondary | ICD-10-CM | POA: Diagnosis not present

## 2023-08-20 DIAGNOSIS — I110 Hypertensive heart disease with heart failure: Secondary | ICD-10-CM | POA: Diagnosis present

## 2023-08-20 DIAGNOSIS — M4626 Osteomyelitis of vertebra, lumbar region: Secondary | ICD-10-CM | POA: Diagnosis present

## 2023-08-20 DIAGNOSIS — I959 Hypotension, unspecified: Secondary | ICD-10-CM

## 2023-08-20 DIAGNOSIS — I714 Abdominal aortic aneurysm, without rupture, unspecified: Secondary | ICD-10-CM | POA: Diagnosis not present

## 2023-08-20 DIAGNOSIS — I10 Essential (primary) hypertension: Secondary | ICD-10-CM | POA: Diagnosis not present

## 2023-08-20 DIAGNOSIS — Z79890 Hormone replacement therapy (postmenopausal): Secondary | ICD-10-CM

## 2023-08-20 DIAGNOSIS — N3001 Acute cystitis with hematuria: Secondary | ICD-10-CM

## 2023-08-20 DIAGNOSIS — Z7902 Long term (current) use of antithrombotics/antiplatelets: Secondary | ICD-10-CM

## 2023-08-20 DIAGNOSIS — J439 Emphysema, unspecified: Secondary | ICD-10-CM | POA: Diagnosis not present

## 2023-08-20 DIAGNOSIS — I7143 Infrarenal abdominal aortic aneurysm, without rupture: Secondary | ICD-10-CM | POA: Diagnosis not present

## 2023-08-20 DIAGNOSIS — I739 Peripheral vascular disease, unspecified: Secondary | ICD-10-CM

## 2023-08-20 DIAGNOSIS — G9341 Metabolic encephalopathy: Secondary | ICD-10-CM

## 2023-08-20 DIAGNOSIS — F0390 Unspecified dementia without behavioral disturbance: Secondary | ICD-10-CM | POA: Diagnosis not present

## 2023-08-20 DIAGNOSIS — F03A3 Unspecified dementia, mild, with mood disturbance: Secondary | ICD-10-CM | POA: Diagnosis present

## 2023-08-20 DIAGNOSIS — E0590 Thyrotoxicosis, unspecified without thyrotoxic crisis or storm: Secondary | ICD-10-CM | POA: Diagnosis present

## 2023-08-20 DIAGNOSIS — A419 Sepsis, unspecified organism: Secondary | ICD-10-CM | POA: Diagnosis not present

## 2023-08-20 DIAGNOSIS — Z8619 Personal history of other infectious and parasitic diseases: Secondary | ICD-10-CM

## 2023-08-20 DIAGNOSIS — L602 Onychogryphosis: Secondary | ICD-10-CM | POA: Diagnosis present

## 2023-08-20 DIAGNOSIS — M4620 Osteomyelitis of vertebra, site unspecified: Secondary | ICD-10-CM | POA: Diagnosis not present

## 2023-08-20 DIAGNOSIS — Z87440 Personal history of urinary (tract) infections: Secondary | ICD-10-CM

## 2023-08-20 DIAGNOSIS — R4182 Altered mental status, unspecified: Secondary | ICD-10-CM | POA: Diagnosis present

## 2023-08-20 DIAGNOSIS — K6812 Psoas muscle abscess: Secondary | ICD-10-CM | POA: Insufficient documentation

## 2023-08-20 DIAGNOSIS — B351 Tinea unguium: Secondary | ICD-10-CM | POA: Insufficient documentation

## 2023-08-20 DIAGNOSIS — F200 Paranoid schizophrenia: Secondary | ICD-10-CM | POA: Diagnosis present

## 2023-08-20 DIAGNOSIS — F17210 Nicotine dependence, cigarettes, uncomplicated: Secondary | ICD-10-CM | POA: Diagnosis present

## 2023-08-20 DIAGNOSIS — C641 Malignant neoplasm of right kidney, except renal pelvis: Secondary | ICD-10-CM | POA: Diagnosis present

## 2023-08-20 DIAGNOSIS — D6959 Other secondary thrombocytopenia: Secondary | ICD-10-CM | POA: Diagnosis present

## 2023-08-20 DIAGNOSIS — L600 Ingrowing nail: Secondary | ICD-10-CM | POA: Diagnosis present

## 2023-08-20 DIAGNOSIS — M4636 Infection of intervertebral disc (pyogenic), lumbar region: Secondary | ICD-10-CM | POA: Diagnosis present

## 2023-08-20 DIAGNOSIS — A498 Other bacterial infections of unspecified site: Secondary | ICD-10-CM | POA: Diagnosis not present

## 2023-08-20 DIAGNOSIS — I5032 Chronic diastolic (congestive) heart failure: Secondary | ICD-10-CM | POA: Diagnosis present

## 2023-08-20 DIAGNOSIS — I700 Atherosclerosis of aorta: Secondary | ICD-10-CM

## 2023-08-20 DIAGNOSIS — F319 Bipolar disorder, unspecified: Secondary | ICD-10-CM | POA: Diagnosis present

## 2023-08-20 DIAGNOSIS — R6520 Severe sepsis without septic shock: Secondary | ICD-10-CM | POA: Diagnosis present

## 2023-08-20 DIAGNOSIS — D649 Anemia, unspecified: Secondary | ICD-10-CM | POA: Diagnosis present

## 2023-08-20 DIAGNOSIS — N390 Urinary tract infection, site not specified: Secondary | ICD-10-CM

## 2023-08-20 DIAGNOSIS — Z1624 Resistance to multiple antibiotics: Secondary | ICD-10-CM | POA: Diagnosis present

## 2023-08-20 DIAGNOSIS — B9620 Unspecified Escherichia coli [E. coli] as the cause of diseases classified elsewhere: Secondary | ICD-10-CM | POA: Diagnosis not present

## 2023-08-20 DIAGNOSIS — Z8673 Personal history of transient ischemic attack (TIA), and cerebral infarction without residual deficits: Secondary | ICD-10-CM

## 2023-08-20 DIAGNOSIS — Z886 Allergy status to analgesic agent status: Secondary | ICD-10-CM

## 2023-08-20 DIAGNOSIS — D696 Thrombocytopenia, unspecified: Secondary | ICD-10-CM | POA: Diagnosis not present

## 2023-08-20 DIAGNOSIS — N2889 Other specified disorders of kidney and ureter: Secondary | ICD-10-CM | POA: Diagnosis not present

## 2023-08-20 DIAGNOSIS — N400 Enlarged prostate without lower urinary tract symptoms: Secondary | ICD-10-CM | POA: Diagnosis present

## 2023-08-20 DIAGNOSIS — Z1152 Encounter for screening for COVID-19: Secondary | ICD-10-CM

## 2023-08-20 DIAGNOSIS — E785 Hyperlipidemia, unspecified: Secondary | ICD-10-CM | POA: Diagnosis present

## 2023-08-20 DIAGNOSIS — Z79899 Other long term (current) drug therapy: Secondary | ICD-10-CM

## 2023-08-20 DIAGNOSIS — J449 Chronic obstructive pulmonary disease, unspecified: Secondary | ICD-10-CM | POA: Diagnosis present

## 2023-08-20 DIAGNOSIS — Z1612 Extended spectrum beta lactamase (ESBL) resistance: Secondary | ICD-10-CM | POA: Diagnosis not present

## 2023-08-20 LAB — CBC WITH DIFFERENTIAL/PLATELET
Abs Immature Granulocytes: 0.05 10*3/uL (ref 0.00–0.07)
Basophils Absolute: 0 10*3/uL (ref 0.0–0.1)
Basophils Relative: 0 %
Eosinophils Absolute: 0 10*3/uL (ref 0.0–0.5)
Eosinophils Relative: 0 %
HCT: 39.6 % (ref 39.0–52.0)
Hemoglobin: 12.6 g/dL — ABNORMAL LOW (ref 13.0–17.0)
Immature Granulocytes: 0 %
Lymphocytes Relative: 5 %
Lymphs Abs: 0.6 10*3/uL — ABNORMAL LOW (ref 0.7–4.0)
MCH: 27.6 pg (ref 26.0–34.0)
MCHC: 31.8 g/dL (ref 30.0–36.0)
MCV: 86.8 fL (ref 80.0–100.0)
Monocytes Absolute: 0.2 10*3/uL (ref 0.1–1.0)
Monocytes Relative: 2 %
Neutro Abs: 10.5 10*3/uL — ABNORMAL HIGH (ref 1.7–7.7)
Neutrophils Relative %: 93 %
Platelets: 123 10*3/uL — ABNORMAL LOW (ref 150–400)
RBC: 4.56 MIL/uL (ref 4.22–5.81)
RDW: 16.4 % — ABNORMAL HIGH (ref 11.5–15.5)
WBC: 11.3 10*3/uL — ABNORMAL HIGH (ref 4.0–10.5)
nRBC: 0 % (ref 0.0–0.2)

## 2023-08-20 LAB — URINALYSIS, W/ REFLEX TO CULTURE (INFECTION SUSPECTED)
Bilirubin Urine: NEGATIVE
Glucose, UA: NEGATIVE mg/dL
Ketones, ur: NEGATIVE mg/dL
Nitrite: POSITIVE — AB
Protein, ur: 30 mg/dL — AB
Specific Gravity, Urine: >1.046 — ABNORMAL HIGH (ref 1.005–1.030)
WBC, UA: >50 WBC/hpf (ref 0–5)
pH: 5 (ref 5.0–8.0)

## 2023-08-20 LAB — COMPREHENSIVE METABOLIC PANEL
ALT: 10 U/L (ref 0–44)
AST: 13 U/L — ABNORMAL LOW (ref 15–41)
Albumin: 2.9 g/dL — ABNORMAL LOW (ref 3.5–5.0)
Alkaline Phosphatase: 89 U/L (ref 38–126)
Anion gap: 13 (ref 5–15)
Anion gap: 9 (ref 5–15)
BUN: 20 mg/dL (ref 8–23)
CO2: 23 mmol/L (ref 22–32)
Calcium: 9 mg/dL (ref 8.9–10.3)
Chloride: 106 mmol/L (ref 98–111)
Creatinine, Ser: 0.75 mg/dL (ref 0.61–1.24)
GFR, Estimated: >60 mL/min (ref >60)
Glucose, Bld: 115 mg/dL — ABNORMAL HIGH (ref 70–99)
Potassium: 4 mmol/L (ref 3.5–5.1)
Sodium: 138 mmol/L (ref 135–145)
Total Bilirubin: 1.7 mg/dL — ABNORMAL HIGH (ref 0.0–1.2)
Total Bilirubin: 1.1 mg/dL (ref 0.0–1.2)
Total Protein: 7 g/dL (ref 6.5–8.1)

## 2023-08-20 LAB — PROTIME-INR
INR: 1.1 (ref 0.8–1.2)
Prothrombin Time: 14.1 s (ref 11.4–15.2)

## 2023-08-20 LAB — LACTIC ACID, PLASMA
Lactic Acid, Venous: 2.1 mmol/L (ref 0.5–1.9)
Lactic Acid, Venous: 1.3 mmol/L (ref 0.5–1.9)

## 2023-08-20 LAB — RESP PANEL BY RT-PCR (RSV, FLU A&B, COVID)  RVPGX2
Influenza A by PCR: NEGATIVE
Influenza B by PCR: NEGATIVE
Resp Syncytial Virus by PCR: NEGATIVE
SARS Coronavirus 2 by RT PCR: NEGATIVE

## 2023-08-20 LAB — BLOOD GAS, VENOUS

## 2023-08-20 MED ORDER — LACTATED RINGERS IV BOLUS (SEPSIS)
250.0000 mL | Freq: Once | INTRAVENOUS | Status: AC
Start: 2023-08-20 — End: 2023-08-20
  Administered 2023-08-20: 250 mL via INTRAVENOUS

## 2023-08-20 MED ORDER — VANCOMYCIN HCL IN DEXTROSE 1-5 GM/200ML-% IV SOLN
1000.0000 mg | Freq: Once | INTRAVENOUS | Status: AC
Start: 2023-08-20 — End: 2023-08-20
  Administered 2023-08-20: 1000 mg via INTRAVENOUS
  Filled 2023-08-20: qty 200

## 2023-08-20 MED ORDER — LACTATED RINGERS IV BOLUS (SEPSIS)
1000.0000 mL | Freq: Once | INTRAVENOUS | Status: AC
Start: 2023-08-20 — End: 2023-08-20
  Administered 2023-08-20: 1000 mL via INTRAVENOUS

## 2023-08-20 MED ORDER — SODIUM CHLORIDE 0.9 % IV SOLN: Freq: Once | INTRAVENOUS | Status: DC

## 2023-08-20 MED ORDER — GADOBUTROL 1 MMOL/ML IV SOLN
7.0000 mL | Freq: Once | INTRAVENOUS | Status: AC | PRN
  Administered 2023-08-20: 7 mL via INTRAVENOUS

## 2023-08-20 MED ORDER — IOHEXOL 300 MG/ML  SOLN
100.0000 mL | Freq: Once | INTRAMUSCULAR | Status: AC | PRN
  Administered 2023-08-20: 100 mL via INTRAVENOUS

## 2023-08-20 MED ORDER — LACTATED RINGERS IV SOLN: INTRAVENOUS | Status: AC

## 2023-08-20 MED ORDER — LACTATED RINGERS IV BOLUS (SEPSIS)
1000.0000 mL | Freq: Once | INTRAVENOUS | Status: AC
  Administered 2023-08-20: 1000 mL via INTRAVENOUS

## 2023-08-20 MED ORDER — CEFEPIME HCL 2 G IV SOLR
2.0000 g | Freq: Once | INTRAVENOUS | Status: AC
  Administered 2023-08-20: 2 g via INTRAVENOUS
  Filled 2023-08-20: qty 12.5

## 2023-08-20 NOTE — ED Notes (Signed)
[address] [place] -Group Home

## 2023-08-20 NOTE — ED Triage Notes (Addendum)
Pt arrives via EMS from Group Home. EMS was called out for unresponsiveness. Pt sts that pt is A/Ox1. Massive smell of urine.   CBG 167 all other VS are WNL

## 2023-08-20 NOTE — ED Notes (Signed)
Multple IV attempts. Unsuccessful. IV team consult placed.

## 2023-08-20 NOTE — Progress Notes (Signed)
Arrived to room at 1635. Pt moved into room at 1615 and then went to CT at 1620. Upon RN arrival NT performing pt care. Once finished, placed 22g USGIV PIV in L arm.

## 2023-08-20 NOTE — ED Provider Triage Note (Signed)
Emergency Medicine Provider Triage Evaluation Note  Marqual Mi , a 73 y.o. male  was evaluated in triage.  Per group home, patient unresponsive. Alert to self. Unknown baseline. Strong aroma of urine.    Physical Exam  There were no vitals taken for this visit. Gen:   Awake, no distress   Resp:  Normal effort  MSK:   Moves extremities without difficulty  Other:    Medical Decision Making  Medically screening exam initiated at 3:39 PM.  Appropriate orders placed.  Markel Ake was informed that the remainder of the evaluation will be completed by another provider, this initial triage assessment does not replace that evaluation, and the importance of remaining in the ED until their evaluation is complete.  Hypotensive. Sepsis workup and CT head ordered. Attempting to get a line and start fluids. Chinita Pester, FNP 08/20/23 [phone]

## 2023-08-20 NOTE — ED Notes (Signed)
 Patient taken to CT.

## 2023-08-20 NOTE — ED Notes (Signed)
IV team at bedside, Pt difficult stick and they were also unable to get blood from IV. Abx and fluids started before labs drawn. Lab notified to come get labs and Cultures.

## 2023-08-20 NOTE — ED Notes (Signed)
Lab at bedside to draw septic labs

## 2023-08-20 NOTE — Sepsis Progress Note (Signed)
 eLink is following this Code Sepsis.

## 2023-08-20 NOTE — ED Provider Notes (Addendum)
[facility] Provider Note    Event Date/Time   First MD Initiated Contact with Patient 08/20/23 1623     (approximate)   History   Altered Mental Status   HPI  [name] is a 73 y.o. male past medical history significant for hypothyroidism, bipolar disorder, BPH, hypertension, who presents to the emergency department with altered mental status.  Patient rife to the emergency department from a group home fall with by an episode of unresponsiveness.  Patient is alert and oriented x 1.  Unknown of his baseline.  Patient without complaints at this time.  Smells of urine.  Denies any complaints and he is uncertain of why he is here.  On chart review patient had a hospitalization 05/25/2023 and had a hip fracture and right hip cellulitis.     Physical Exam   Triage Vital Signs: ED Triage Vitals  Encounter Vitals Group     BP 08/20/23 1546 (!) 84/53     Systolic BP Percentile --      Diastolic BP Percentile --      Pulse Rate 08/20/23 1546 (!) 115     Resp 08/20/23 1546 18     Temp 08/20/23 1546 97.8 F (36.6 C)     Temp Source 08/20/23 1546 Oral     SpO2 08/20/23 1546 95 %     Weight --      Height 08/20/23 1540 5\' 6"  (1.676 m)     Head Circumference --      Peak Flow --      Pain Score 08/20/23 1540 0     Pain Loc --      Pain Education --      Exclude from Growth Chart --     Most recent vital signs: Vitals:   08/20/23 2000 08/20/23 2203  BP: (!) 148/74 (!) 160/87  Pulse: 97 79  Resp: 17 17  Temp: 97.8 F (36.6 C)   SpO2: 98% 100%    Physical Exam Constitutional:      Appearance: He is well-developed.     Comments: Strong smell of urine  HENT:     Head: Atraumatic.  Eyes:     Extraocular Movements: Extraocular movements intact.     Conjunctiva/sclera: Conjunctivae normal.     Pupils: Pupils are equal, round, and reactive to light.  Cardiovascular:     Rate and Rhythm: Regular rhythm.  Pulmonary:     Effort: No respiratory  distress.  Musculoskeletal:        General: Normal range of motion.     Cervical back: Normal range of motion.     Right lower leg: No edema.     Left lower leg: No edema.     Comments: Mild erythema to the right side of the hip but no overlying warmth or induration.  Skin:    General: Skin is warm.     Capillary Refill: Capillary refill takes 2 to 3 seconds.  Neurological:     Mental Status: He is alert. He is disoriented.     IMPRESSION / MDM / ASSESSMENT AND PLAN / ED COURSE  I reviewed the triage vital signs and the nursing notes.  On arrival patient is afebrile but tachycardic and hypotensive with a blood pressure of 84/53.  Difficult IV stick, required ultrasound-guided IV.  Delay in antibiotics and IV fluids.  Blood cultures obtained.  Started on 30 cc/kg of IV fluids.  Started on broad-spectrum antibiotics to cover for unknown source.  Given vancomycin and cefepime.   EKG  I, Corena Herter, the attending physician, personally viewed and interpreted this ECG.  Sinus tachycardia with heart rate of 104.  Normal intervals.  Nonspecific ST changes.  No significant change when compared to prior EKG.  Sinus tachycardia while on cardiac telemetry.  RADIOLOGY I independently reviewed imaging, my interpretation of imaging: CT scan of the head with atrophy but no signs of intracranial hemorrhage  Chest x-ray read as no acute findings  CT scan chest abdomen pelvis -discussed the results with the radiologist on-call.  Concern for possible discitis and recommended MRI with and without contrast.  MR with findings concerning for discitis/osteomyelitis at L1 1 L2 with a small psoas muscle abscess  LABS (all labs ordered are listed, but only abnormal results are displayed) Labs interpreted as -    Labs Reviewed  COMPREHENSIVE METABOLIC PANEL - Abnormal; Notable for the following components:      Result Value   Total Bilirubin 1.7 (*)    All other components within normal limits   URINALYSIS, W/ REFLEX TO CULTURE (INFECTION SUSPECTED) - Abnormal; Notable for the following components:   Color, Urine YELLOW (*)    APPearance HAZY (*)    Specific Gravity, Urine >1.046 (*)    Hgb urine dipstick MODERATE (*)    Protein, ur 30 (*)    Nitrite POSITIVE (*)    Leukocytes,Ua MODERATE (*)    Bacteria, UA MANY (*)    All other components within normal limits  LACTIC ACID, PLASMA - Abnormal; Notable for the following components:   Lactic Acid, Venous 2.1 (*)    All other components within normal limits  CBC WITH DIFFERENTIAL/PLATELET - Abnormal; Notable for the following components:   WBC 11.3 (*)    Hemoglobin 12.6 (*)    RDW 16.4 (*)    Platelets 123 (*)    Neutro Abs 10.5 (*)    Lymphs Abs 0.6 (*)    All other components within normal limits  BLOOD GAS, VENOUS - Abnormal; Notable for the following components:   pO2, Ven <31 (*)    Bicarbonate 30.2 (*)    Acid-Base Excess 3.1 (*)    All other components within normal limits  COMPREHENSIVE METABOLIC PANEL - Abnormal; Notable for the following components:   Glucose, Bld 115 (*)    Albumin 2.9 (*)    AST 13 (*)    All other components within normal limits  RESP PANEL BY RT-PCR (RSV, FLU A&B, COVID)  RVPGX2  CULTURE, BLOOD (ROUTINE X 2)  CULTURE, BLOOD (ROUTINE X 2)  URINE CULTURE  LACTIC ACID, PLASMA  LACTIC ACID, PLASMA  CBC WITH DIFFERENTIAL/PLATELET  PROTIME-INR  LACTIC ACID, PLASMA  PROTIME-INR     MDM  Leukocytosis of 11.  Lactic acid elevated at 3.8.  Creatinine at baseline with no significant electrolyte abnormality.  Elevated T. bili at 1.7 but does have hemolysis.  Chest x-ray with no signs of pneumonia.  Notified that the patient's lab work was done in error and mixed up with another patient's.  Needing a lab redraw.  Difficult IV stick and then the blood work was hemolyzed.  CT scan obtained with concern for possible discitis/osteomyelitis and possible pneumonia.  Patient received  broad-spectrum antibiotics.  MRI obtained.  Initial lactic acid elevated at 2.1, this was after he received IV fluids given that his initial questionable lactic acid that was an error was 3.8.  Patient was fluid responsive with significant improvement of his blood pressure.  Consulted hospitalist for admission for sepsis.  MRI resulted with findings concerning for discitis/osteomyelitis.  Also has a small psoas abscess.  Does have findings concerning for urinary tract infection.     PROCEDURES:  Critical Care performed: yes  .Critical Care  Performed by: Corena Herter, MD Authorized by: Corena Herter, MD   Critical care provider statement:    Critical care time (minutes):  30   Critical care time was exclusive of:  Separately billable procedures and treating other patients   Critical care was necessary to treat or prevent imminent or life-threatening deterioration of the following conditions:  Sepsis   Critical care was time spent personally by me on the following activities:  Development of treatment plan with patient or surrogate, discussions with consultants, evaluation of patient's response to treatment, examination of patient, ordering and review of laboratory studies, ordering and review of radiographic studies, ordering and performing treatments and interventions, pulse oximetry, re-evaluation of patient's condition and review of old charts   Care discussed with: admitting provider     Patient's presentation is most consistent with acute presentation with potential threat to life or bodily function.   MEDICATIONS ORDERED IN ED: Medications  0.9 %  sodium chloride infusion (has no administration in time range)  lactated ringers infusion ( Intravenous New Bag/Given 08/20/23 2202)  lactated ringers bolus 1,000 mL (0 mLs Intravenous Stopped 08/20/23 1856)    And  lactated ringers bolus 1,000 mL (0 mLs Intravenous Stopped 08/20/23 2202)    And  lactated ringers bolus 250 mL (0 mLs  Intravenous Stopped 08/20/23 2220)  ceFEPIme (MAXIPIME) 2 g in sodium chloride 0.9 % 100 mL IVPB (0 g Intravenous Stopped 08/20/23 1844)  vancomycin (VANCOCIN) IVPB 1000 mg/200 mL premix (0 mg Intravenous Stopped 08/20/23 2157)  iohexol (OMNIPAQUE) 300 MG/ML solution 100 mL (100 mLs Intravenous Contrast Given 08/20/23 1721)  gadobutrol (GADAVIST) 1 MMOL/ML injection 7 mL (7 mLs Intravenous Contrast Given 08/20/23 2048)    FINAL CLINICAL IMPRESSION(S) / ED DIAGNOSES   Final diagnoses:  Altered mental status, unspecified altered mental status type  Hypotension, unspecified hypotension type  Discitis of lumbar region  Psoas abscess (HCC)  Acute cystitis with hematuria     Rx / DC Orders   ED Discharge Orders     None        Note:  This document was prepared using Dragon voice recognition software and may include unintentional dictation errors.   Corena Herter, MD 08/20/23 1721    Corena Herter, MD 08/20/23 1610    Corena Herter, MD 08/20/23 [id number]    Corena Herter, MD 08/20/23 2320

## 2023-08-20 NOTE — ED Notes (Signed)
Brief changed of urine

## 2023-08-20 NOTE — ED Notes (Signed)
Green top resent to lab at this time. 

## 2023-08-20 NOTE — Sepsis Progress Note (Signed)
Secure chat with bedside RN Laurena Slimmer who confirmed that there was an attempt at the blood cultures but the staff was unsuccessful. Antibiotics were then given to prevent further delay of care.

## 2023-08-20 NOTE — ED Notes (Signed)
 Pt in MRI at this time

## 2023-08-20 NOTE — Consult Note (Signed)
CODE SEPSIS - PHARMACY COMMUNICATION  **Broad-spectrum antimicrobials should be administered within one hour of sepsis diagnosis**  Time Code Sepsis call or page was received: 1641  Antibiotics ordered: Cefepime, Vancomycin  Time of first antibiotic administration: 1652  Additional action taken by pharmacy: N/A  If necessary, name of provider/nurse contacted: N/A    Will M. Dareen Piano, PharmD Clinical Pharmacist 08/20/2023 5:57 PM

## 2023-08-20 NOTE — ED Notes (Signed)
Lab with pt at this time 

## 2023-08-21 ENCOUNTER — Inpatient Hospital Stay: Payer: Medicare HMO

## 2023-08-21 DIAGNOSIS — I714 Abdominal aortic aneurysm, without rupture, unspecified: Secondary | ICD-10-CM

## 2023-08-21 DIAGNOSIS — F0390 Unspecified dementia without behavioral disturbance: Secondary | ICD-10-CM

## 2023-08-21 DIAGNOSIS — M4646 Discitis, unspecified, lumbar region: Secondary | ICD-10-CM

## 2023-08-21 DIAGNOSIS — I739 Peripheral vascular disease, unspecified: Secondary | ICD-10-CM

## 2023-08-21 DIAGNOSIS — I700 Atherosclerosis of aorta: Secondary | ICD-10-CM

## 2023-08-21 DIAGNOSIS — E0590 Thyrotoxicosis, unspecified without thyrotoxic crisis or storm: Secondary | ICD-10-CM

## 2023-08-21 DIAGNOSIS — D696 Thrombocytopenia, unspecified: Secondary | ICD-10-CM

## 2023-08-21 DIAGNOSIS — G9341 Metabolic encephalopathy: Secondary | ICD-10-CM

## 2023-08-21 DIAGNOSIS — A419 Sepsis, unspecified organism: Secondary | ICD-10-CM

## 2023-08-21 DIAGNOSIS — N3001 Acute cystitis with hematuria: Secondary | ICD-10-CM

## 2023-08-21 DIAGNOSIS — F319 Bipolar disorder, unspecified: Secondary | ICD-10-CM

## 2023-08-21 DIAGNOSIS — K6812 Psoas muscle abscess: Secondary | ICD-10-CM

## 2023-08-21 DIAGNOSIS — R6520 Severe sepsis without septic shock: Secondary | ICD-10-CM | POA: Diagnosis not present

## 2023-08-21 DIAGNOSIS — J439 Emphysema, unspecified: Secondary | ICD-10-CM

## 2023-08-21 DIAGNOSIS — I5032 Chronic diastolic (congestive) heart failure: Secondary | ICD-10-CM

## 2023-08-21 DIAGNOSIS — J690 Pneumonitis due to inhalation of food and vomit: Secondary | ICD-10-CM

## 2023-08-21 LAB — BASIC METABOLIC PANEL
Anion gap: 9 (ref 5–15)
BUN: 16 mg/dL (ref 8–23)
CO2: 25 mmol/L (ref 22–32)
Calcium: 9.2 mg/dL (ref 8.9–10.3)
Chloride: 104 mmol/L (ref 98–111)
Creatinine, Ser: 0.59 mg/dL — ABNORMAL LOW (ref 0.61–1.24)
GFR, Estimated: >60 mL/min (ref >60)
Glucose, Bld: 76 mg/dL (ref 70–99)
Potassium: 3.7 mmol/L (ref 3.5–5.1)
Sodium: 138 mmol/L (ref 135–145)

## 2023-08-21 LAB — CBC
HCT: 35 % — ABNORMAL LOW (ref 39.0–52.0)
Hemoglobin: 11.1 g/dL — ABNORMAL LOW (ref 13.0–17.0)
MCH: 27.3 pg (ref 26.0–34.0)
MCHC: 31.7 g/dL (ref 30.0–36.0)
MCV: 86.2 fL (ref 80.0–100.0)
Platelets: 127 10*3/uL — ABNORMAL LOW (ref 150–400)
RBC: 4.06 MIL/uL — ABNORMAL LOW (ref 4.22–5.81)
RDW: 16.4 % — ABNORMAL HIGH (ref 11.5–15.5)
WBC: 6.6 10*3/uL (ref 4.0–10.5)
nRBC: 0 % (ref 0.0–0.2)

## 2023-08-21 LAB — URINE CULTURE: Culture: <10000 — AB

## 2023-08-21 MED ORDER — CEFEPIME HCL 2 G IV SOLR
2.0000 g | Freq: Three times a day (TID) | INTRAVENOUS | Status: DC
  Administered 2023-08-21 – 2023-08-23 (×8): 2 g via INTRAVENOUS
  Filled 2023-08-21 (×9): qty 12.5

## 2023-08-21 MED ORDER — MIDAZOLAM HCL 2 MG/2ML IJ SOLN
INTRAMUSCULAR | Status: AC
  Filled 2023-08-21: qty 4

## 2023-08-21 MED ORDER — CITALOPRAM HYDROBROMIDE 20 MG PO TABS
40.0000 mg | ORAL_TABLET | Freq: Every day | ORAL | Status: DC
  Administered 2023-08-22 – 2023-09-01 (×11): 40 mg via ORAL
  Filled 2023-08-21 (×11): qty 2

## 2023-08-21 MED ORDER — GUAIFENESIN ER 600 MG PO TB12
600.0000 mg | ORAL_TABLET | Freq: Two times a day (BID) | ORAL | Status: DC
  Administered 2023-08-21 – 2023-09-01 (×21): 600 mg via ORAL
  Filled 2023-08-21 (×21): qty 1

## 2023-08-21 MED ORDER — SODIUM CHLORIDE 0.9 % IV SOLN: INTRAVENOUS | Status: DC

## 2023-08-21 MED ORDER — ENOXAPARIN SODIUM 40 MG/0.4ML IJ SOSY: 40.0000 mg | PREFILLED_SYRINGE | INTRAMUSCULAR | Status: DC

## 2023-08-21 MED ORDER — VANCOMYCIN HCL 750 MG IV SOLR
750.0000 mg | Freq: Once | INTRAVENOUS | Status: DC
  Filled 2023-08-21: qty 15

## 2023-08-21 MED ORDER — HYDROCODONE-ACETAMINOPHEN 5-325 MG PO TABS
1.0000 | ORAL_TABLET | Freq: Four times a day (QID) | ORAL | Status: DC | PRN
  Administered 2023-08-22: 2 via ORAL
  Filled 2023-08-21: qty 2

## 2023-08-21 MED ORDER — ACETAMINOPHEN 650 MG RE SUPP: 650.0000 mg | Freq: Four times a day (QID) | RECTAL | Status: DC | PRN

## 2023-08-21 MED ORDER — FERROUS SULFATE 325 (65 FE) MG PO TABS
325.0000 mg | ORAL_TABLET | Freq: Every day | ORAL | Status: DC
  Administered 2023-08-22 – 2023-09-01 (×11): 325 mg via ORAL
  Filled 2023-08-21 (×11): qty 1

## 2023-08-21 MED ORDER — ALBUTEROL SULFATE (2.5 MG/3ML) 0.083% IN NEBU: 2.5000 mg | INHALATION_SOLUTION | RESPIRATORY_TRACT | Status: DC | PRN

## 2023-08-21 MED ORDER — ONDANSETRON HCL 4 MG/2ML IJ SOLN: 4.0000 mg | Freq: Four times a day (QID) | INTRAMUSCULAR | Status: DC | PRN

## 2023-08-21 MED ORDER — VANCOMYCIN HCL IN DEXTROSE 1-5 GM/200ML-% IV SOLN
1000.0000 mg | Freq: Two times a day (BID) | INTRAVENOUS | Status: DC
  Administered 2023-08-21 – 2023-08-22 (×3): 1000 mg via INTRAVENOUS
  Filled 2023-08-21 (×3): qty 200

## 2023-08-21 MED ORDER — METHIMAZOLE 5 MG PO TABS
5.0000 mg | ORAL_TABLET | Freq: Three times a day (TID) | ORAL | Status: DC
  Administered 2023-08-21 – 2023-09-01 (×31): 5 mg via ORAL
  Filled 2023-08-21 (×36): qty 1

## 2023-08-21 MED ORDER — ONDANSETRON HCL 4 MG PO TABS: 4.0000 mg | ORAL_TABLET | Freq: Four times a day (QID) | ORAL | Status: DC | PRN

## 2023-08-21 MED ORDER — RISPERIDONE 0.5 MG PO TABS
1.0000 mg | ORAL_TABLET | Freq: Every day | ORAL | Status: DC
  Administered 2023-08-21 – 2023-08-31 (×11): 1 mg via ORAL
  Filled 2023-08-21 (×3): qty 2
  Filled 2023-08-21: qty 1
  Filled 2023-08-21 (×3): qty 2
  Filled 2023-08-21: qty 1
  Filled 2023-08-21 (×3): qty 2

## 2023-08-21 MED ORDER — ACETAMINOPHEN 325 MG PO TABS: 650.0000 mg | ORAL_TABLET | Freq: Four times a day (QID) | ORAL | Status: DC | PRN

## 2023-08-21 MED ORDER — VANCOMYCIN HCL 750 MG/150ML IV SOLN
750.0000 mg | Freq: Once | INTRAVENOUS | Status: DC
  Administered 2023-08-21: 750 mg via INTRAVENOUS
  Filled 2023-08-21: qty 150

## 2023-08-21 MED ORDER — FENTANYL CITRATE (PF) 100 MCG/2ML IJ SOLN
INTRAMUSCULAR | Status: AC
  Filled 2023-08-21: qty 2

## 2023-08-21 MED ORDER — TAMSULOSIN HCL 0.4 MG PO CAPS
0.4000 mg | ORAL_CAPSULE | Freq: Every day | ORAL | Status: DC
  Administered 2023-08-21 – 2023-08-31 (×11): 0.4 mg via ORAL
  Filled 2023-08-21 (×11): qty 1

## 2023-08-21 NOTE — Assessment & Plan Note (Addendum)
 Continue to monitor blood pressure

## 2023-08-21 NOTE — ED Notes (Signed)
Stopped LR in [facility] because it was not running; unknown when it was actually stopped.

## 2023-08-21 NOTE — H&P (Signed)
History and Physical    Patient: [name] ZOX:[id number] DOB: June 07, 1951 DOA: 08/20/2023 DOS: the patient was seen and examined on 08/21/2023 PCP: Koren Bound, NP  Patient coming from:  Group home  Chief Complaint:  Chief Complaint  Patient presents with   Altered Mental Status    HPI: [name] is a 73 y.o. male with medical history significant for dementia, bipolar, hypertension, COPD, diastolic CHF, stroke, hypothyroidism, ESBL UTI, with history of right hip fracture with local cellulitis in November 2024, who presents from his group home with decreased responsiveness.  At baseline he is alert and oriented x 1.  Per triage note he was noted smell of urine on arrival.  History is limited due to change in mentation. ED course and data review: Hypotensive and tachycardic on arrival at 84/53 and 115 with otherwise normal vitals.  BP responded to fluid boluses and improved to 160/87 by admission. Workup in the ED significant for the following: VBG unremarkable WBC 11,000 with lactic acid 1.3 Hemoglobin 12.6 with platelets 123000(baseline Hb around 10) Respiratory viral panel negative for COVID flu and RSV Urinalysis consistent with UTI with positive nitrite and leukocyte esterase and many bacteria EKG, personally viewed and interpreted showing sinus tachycardia at 104 with LVH and LAFB CT head nonacute CT chest abdomen and pelvis with contrast concerning for osteomyelitis and possible discitis L1-L2, possible aspiration, cystic neoplasm kidney among other findings as further detailed below  IMPRESSION: Interval development of erosive changes surrounding the disc space at L1-2 with some fluid. Developing area of osteomyelitis and discitis is possible. Recommend further evaluation such as MRI with and without contrast.  Bilateral lower lobe lung parenchymal opacities, right-greater-than-left with debris along the course of the associated bronchi more on the right than left.  Infiltrative process is possible. Please correlate for any clinical evidence aspiration.  No bowel obstruction, free air.  Scattered colonic stool.  Complex lesion as described previously along lower pole of the right kidney. A cystic neoplasm of the kidney is possible. Further workup is recommended when appropriate.  Extensive atherosclerotic changes identified diffusely. There is consent plaque and calcification along the aortic valve. 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 also a 3.2 cm in for abdominal aortic aneurysm. Previously 3.1 cm. Recommend follow-up ultrasound every 3 years. (Ref.: J Vasc Surg. 2018; 67:2-77 and J Am Coll Radiol 2013;10(10):789-794.)  Further evaluation with MRI confirmed the following:  Findings consistent with discitis-osteomyelitis at L1-L2 with 14 x 12 mm abscess in the right psoas muscle. No epidural fluid Collection  Patient treated with sepsis fluids, started on cefepime and vancomycin Hospitalist consulted for admission.    Past Medical History:  Diagnosis Date   Bipolar disorder (HCC)    COPD (chronic obstructive pulmonary disease) (HCC)    Depression    GERD (gastroesophageal reflux disease)    GIB (gastrointestinal bleeding)    Hyperlipidemia    Hypertension    Mild dementia (HCC)    Paranoid schizophrenia (HCC)    Smoker    Tobacco dependence    Past Surgical History:  Procedure Laterality Date   INTRAMEDULLARY (IM) NAIL INTERTROCHANTERIC Right 05/18/2023   Procedure: INTRAMEDULLARY (IM) NAIL INTERTROCHANTERIC;  Surgeon: Deeann Saint, MD;  Location: [facility] ORS;  Service: Orthopedics;  Laterality: Right;   Social History:  reports that he has been smoking cigarettes. He has never used smokeless tobacco. He reports that he does not drink alcohol  and does not use drugs.  Allergies  Allergen Reactions   Asa [Aspirin]     Hx GI bleed     Family History  Family history unknown: Yes    Prior to Admission medications   Medication Sig Start Date End Date Taking? Authorizing Provider  ascorbic acid (VITAMIN C) 500 MG tablet Take 1 tablet (500 mg total) by mouth 2 (two) times daily. 05/25/23   Enedina Finner, MD  Cholecalciferol (VITAMIN D3) 1000 units CAPS Take 1 capsule (1,000 Units total) by mouth daily. 08/05/17   Plonk, Chrissie Noa, MD  citalopram (CELEXA) 40 MG tablet Take 1 tablet (40 mg total) by mouth daily. 09/18/20   Zena Amos, MD  feeding supplement (ENSURE ENLIVE / ENSURE PLUS) LIQD Take 237 mLs by mouth 3 (three) times daily between meals. 05/25/23   Enedina Finner, MD  ferrous sulfate 325 (65 FE) MG tablet Take 1 tablet (325 mg total) by mouth daily with breakfast. 05/26/23   Enedina Finner, MD  HYDROcodone-acetaminophen (NORCO/VICODIN) 5-325 MG tablet Take 1-2 tablets by mouth every 6 (six) hours as needed for moderate pain (pain score 4-6). 05/25/23   Enedina Finner, MD  leptospermum manuka honey (MEDIHONEY) PSTE paste Apply 1 Application topically daily. 05/25/23   Enedina Finner, MD  methimazole (TAPAZOLE) 5 MG tablet Take 5 mg by mouth 3 (three) times daily. 01/11/23   [provider]  Multiple Vitamin (MULTIVITAMIN WITH MINERALS) TABS tablet Take 1 tablet by mouth daily. 05/25/23   Enedina Finner, MD  Nystatin (GERHARDT'S BUTT CREAM) CREA Apply 1 Application topically 3 (three) times daily. 05/25/23   Enedina Finner, MD  risperiDONE (RISPERDAL) 1 MG tablet Take 1 tablet (1 mg total) by mouth daily. 09/18/20   Zena Amos, MD  senna (SENOKOT) 8.6 MG TABS tablet Take 1 tablet (8.6 mg total) by mouth 2 (two) times daily. 05/25/23   Enedina Finner, MD  simvastatin (ZOCOR) 20 MG tablet TAKE 1 TABLET BY MOUTH DAILY 05/31/17   Schuyler Amor, MD  tamsulosin (FLOMAX) 0.4 MG CAPS capsule TAKE 1 CAPSULE BY MOUTH DAILY AFTER SUPPER 12/11/16   Schuyler Amor, MD    Physical Exam: Vitals:   08/20/23 1850 08/20/23 2000 08/20/23 2203 08/21/23 0025   BP: (!) 155/97 (!) 148/74 (!) 160/87   Pulse: 96 97 79   Resp: 17 17 17    Temp:  97.8 F (36.6 C)  98 F (36.7 C)  TempSrc:  Oral  Oral  SpO2: 98% 98% 100%   Height:       Physical Exam Vitals and nursing note reviewed.  Constitutional:      General: He is not in acute distress.    Comments: Oriented x1  HENT:     Head: Normocephalic and atraumatic.  Cardiovascular:     Rate and Rhythm: Normal rate and regular rhythm.     Heart sounds: Normal heart sounds.  Pulmonary:     Effort: Pulmonary effort is normal.     Breath sounds: Normal breath sounds.  Abdominal:     Palpations: Abdomen is soft.     Tenderness: There is no abdominal tenderness.  Skin:    Findings: Erythema present.     Comments: Erythema and warmth at site of prior hip repair, non tender. See pic below  Neurological:     Mental Status: He is alert. Mental status is at baseline.     Labs on Admission: I have personally reviewed following labs and imaging studies  CBC: Recent Labs  Lab 08/20/23 1542  08/20/23 1847  WBC QUESTIONABLE IDENTIFICATION / INCORRECTLY LABELED SPECIMEN 11.3*  NEUTROABS QUESTIONABLE IDENTIFICATION / INCORRECTLY LABELED SPECIMEN 10.5*  HGB QUESTIONABLE IDENTIFICATION / INCORRECTLY LABELED SPECIMEN 12.6*  HCT QUESTIONABLE IDENTIFICATION / INCORRECTLY LABELED SPECIMEN 39.6  MCV QUESTIONABLE IDENTIFICATION / INCORRECTLY LABELED SPECIMEN 86.8  PLT QUESTIONABLE IDENTIFICATION / INCORRECTLY LABELED SPECIMEN 123*   Basic Metabolic Panel: Recent Labs  Lab 08/20/23 1542 08/20/23 2100  NA QUESTIONABLE IDENTIFICATION / INCORRECTLY LABELED SPECIMEN 138  K QUESTIONABLE IDENTIFICATION / INCORRECTLY LABELED SPECIMEN 4.0  CL QUESTIONABLE IDENTIFICATION / INCORRECTLY LABELED SPECIMEN 106  CO2 QUESTIONABLE IDENTIFICATION / INCORRECTLY LABELED SPECIMEN 23  GLUCOSE QUESTIONABLE IDENTIFICATION / INCORRECTLY LABELED SPECIMEN 115*  BUN QUESTIONABLE IDENTIFICATION / INCORRECTLY LABELED SPECIMEN 20   CREATININE QUESTIONABLE IDENTIFICATION / INCORRECTLY LABELED SPECIMEN 0.75  CALCIUM QUESTIONABLE IDENTIFICATION / INCORRECTLY LABELED SPECIMEN 9.0   GFR: CrCl cannot be calculated (Unknown ideal weight.). Liver Function Tests: Recent Labs  Lab 08/20/23 1542 08/20/23 2100  AST QUESTIONABLE IDENTIFICATION / INCORRECTLY LABELED SPECIMEN 13*  ALT QUESTIONABLE IDENTIFICATION / INCORRECTLY LABELED SPECIMEN 10  ALKPHOS QUESTIONABLE IDENTIFICATION / INCORRECTLY LABELED SPECIMEN 89  BILITOT 1.7* 1.1  PROT QUESTIONABLE IDENTIFICATION / INCORRECTLY LABELED SPECIMEN 7.0  ALBUMIN QUESTIONABLE IDENTIFICATION / INCORRECTLY LABELED SPECIMEN 2.9*   No results for input(s): "LIPASE", "AMYLASE" in the last 168 hours. No results for input(s): "AMMONIA" in the last 168 hours. Coagulation Profile: Recent Labs  Lab 08/20/23 1713 08/20/23 1847  INR QUESTIONABLE IDENTIFICATION / INCORRECTLY LABELED SPECIMEN 1.1   Cardiac Enzymes: No results for input(s): "CKTOTAL", "CKMB", "CKMBINDEX", "TROPONINI" in the last 168 hours. BNP (last 3 results) No results for input(s): "PROBNP" in the last 8760 hours. HbA1C: No results for input(s): "HGBA1C" in the last 72 hours. CBG: No results for input(s): "GLUCAP" in the last 168 hours. Lipid Profile: No results for input(s): "CHOL", "HDL", "LDLCALC", "TRIG", "CHOLHDL", "LDLDIRECT" in the last 72 hours. Thyroid Function Tests: No results for input(s): "TSH", "T4TOTAL", "FREET4", "T3FREE", "THYROIDAB" in the last 72 hours. Anemia Panel: No results for input(s): "VITAMINB12", "FOLATE", "FERRITIN", "TIBC", "IRON", "RETICCTPCT" in the last 72 hours. Urine analysis:    Component Value Date/Time   COLORURINE YELLOW (A) 08/20/2023 2119   APPEARANCEUR HAZY (A) 08/20/2023 2119   APPEARANCEUR Clear 01/03/2013 0232   LABSPEC >1.046 (H) 08/20/2023 2119   LABSPEC 1.006 01/03/2013 0232   PHURINE 5.0 08/20/2023 2119   GLUCOSEU NEGATIVE 08/20/2023 2119   GLUCOSEU Negative  01/03/2013 0232   HGBUR MODERATE (A) 08/20/2023 2119   BILIRUBINUR NEGATIVE 08/20/2023 2119   BILIRUBINUR Negative 01/03/2013 0232   KETONESUR NEGATIVE 08/20/2023 2119   PROTEINUR 30 (A) 08/20/2023 2119   NITRITE POSITIVE (A) 08/20/2023 2119   LEUKOCYTESUR MODERATE (A) 08/20/2023 2119   LEUKOCYTESUR Negative 01/03/2013 0232    Radiological Exams on Admission: MR Lumbar Spine W Wo Contrast Result Date: 08/20/2023 CLINICAL DATA:  Back pain with infection suspected. EXAM: MRI THORACIC AND LUMBAR SPINE WITHOUT AND WITH CONTRAST TECHNIQUE: Multiplanar and multiecho pulse sequences of the thoracic and lumbar spine were obtained without and with intravenous contrast. CONTRAST:  7mL GADAVIST GADOBUTROL 1 MMOL/ML IV SOLN COMPARISON:  None Available. FINDINGS: MRI THORACIC SPINE FINDINGS Alignment:  Physiologic. Vertebrae: No fracture, evidence of discitis, or bone lesion. Cord:  Normal signal and morphology. Paraspinal and other soft tissues: Negative. Disc levels: No spinal canal stenosis. MRI LUMBAR SPINE FINDINGS Segmentation:  Standard Alignment:  Physiologic. Vertebrae: There is abnormal signal of the bone marrow at L1 and L2 with edema and contrast enhancement of  the disc space. No epidural fluid collection. Conus medullaris: Extends to the L1 level and appears normal. Paraspinal and other soft tissues: Fluid collection within the right psoas muscle at the L1-2 level measuring 14 x 12 mm. Disc levels: L1-L2: Edema within the disc space as above. No spinal canal stenosis. Mild severe bilateral neural foraminal stenosis. L2-L3: Small disc bulge. No spinal canal stenosis. No neural foraminal stenosis. L3-L4: Small disc bulge. Narrowing of both lateral recesses without central spinal canal stenosis. No neural foraminal stenosis. L4-L5: Intermediate sized right asymmetric disc bulge. Right lateral recess narrowing without central spinal canal stenosis. No neural foraminal stenosis. L5-S1: Small right  subarticular disc protrusion with annular fissure. Right lateral recess narrowing without central spinal canal stenosis. No neural foraminal stenosis. Visualized sacrum: Normal. IMPRESSION: 1. Findings consistent with discitis-osteomyelitis at L1-L2 with 14 x 12 mm abscess in the right psoas muscle. No epidural fluid collection. 2. Mild-to-moderate bilateral L1-L2 neural foraminal stenosis. 3. Narrowing of the lateral recesses bilaterally at L3-L4, on the right at L4-L5 and on the right at L5-S1. 4. No spinal canal stenosis. Electronically Signed   By: Deatra Robinson M.D.   On: 08/20/2023 21:48   MR THORACIC SPINE W WO CONTRAST Result Date: 08/20/2023 CLINICAL DATA:  Back pain with infection suspected. EXAM: MRI THORACIC AND LUMBAR SPINE WITHOUT AND WITH CONTRAST TECHNIQUE: Multiplanar and multiecho pulse sequences of the thoracic and lumbar spine were obtained without and with intravenous contrast. CONTRAST:  7mL GADAVIST GADOBUTROL 1 MMOL/ML IV SOLN COMPARISON:  None Available. FINDINGS: MRI THORACIC SPINE FINDINGS Alignment:  Physiologic. Vertebrae: No fracture, evidence of discitis, or bone lesion. Cord:  Normal signal and morphology. Paraspinal and other soft tissues: Negative. Disc levels: No spinal canal stenosis. MRI LUMBAR SPINE FINDINGS Segmentation:  Standard Alignment:  Physiologic. Vertebrae: There is abnormal signal of the bone marrow at L1 and L2 with edema and contrast enhancement of the disc space. No epidural fluid collection. Conus medullaris: Extends to the L1 level and appears normal. Paraspinal and other soft tissues: Fluid collection within the right psoas muscle at the L1-2 level measuring 14 x 12 mm. Disc levels: L1-L2: Edema within the disc space as above. No spinal canal stenosis. Mild severe bilateral neural foraminal stenosis. L2-L3: Small disc bulge. No spinal canal stenosis. No neural foraminal stenosis. L3-L4: Small disc bulge. Narrowing of both lateral recesses without central  spinal canal stenosis. No neural foraminal stenosis. L4-L5: Intermediate sized right asymmetric disc bulge. Right lateral recess narrowing without central spinal canal stenosis. No neural foraminal stenosis. L5-S1: Small right subarticular disc protrusion with annular fissure. Right lateral recess narrowing without central spinal canal stenosis. No neural foraminal stenosis. Visualized sacrum: Normal. IMPRESSION: 1. Findings consistent with discitis-osteomyelitis at L1-L2 with 14 x 12 mm abscess in the right psoas muscle. No epidural fluid collection. 2. Mild-to-moderate bilateral L1-L2 neural foraminal stenosis. 3. Narrowing of the lateral recesses bilaterally at L3-L4, on the right at L4-L5 and on the right at L5-S1. 4. No spinal canal stenosis. Electronically Signed   By: Deatra Robinson M.D.   On: 08/20/2023 21:48   CT CHEST ABDOMEN PELVIS W CONTRAST Result Date: 08/20/2023 CLINICAL DATA:  Sepsis EXAM: CT CHEST, ABDOMEN, AND PELVIS WITH CONTRAST TECHNIQUE: Multidetector CT imaging of the chest, abdomen and pelvis was performed following the standard protocol during bolus administration of intravenous contrast. RADIATION DOSE REDUCTION: This exam was performed according to the departmental dose-optimization program which includes automated exposure control, adjustment of the mA and/or kV  according to patient size and/or use of iterative reconstruction technique. CONTRAST:  OMNIPAQUE IOHEXOL 300 MG/ML  SOLN COMPARISON:  Chest x-ray 08/20/2023 and older. Abdomen pelvis CT 03/18/2023. FINDINGS: CT CHEST FINDINGS Cardiovascular: Heart is nonenlarged. No pericardial effusion. Coronary artery calcifications are seen. Is also calcifications along the aortic valve. The thoracic aorta overall has a normal course and caliber with scattered atherosclerotic calcified plaque. There is a bovine type aortic arch, normal variant. Mediastinum/Nodes: Slightly heterogeneous thyroid gland. Grossly normal course and caliber to  the esophagus. No abnormal lymph node enlargement seen in the left axillary region, hilum or mediastinum. In the right axillary region is a presumed abnormal lymph node measuring 4.2 by 2.9 cm. This would have a differential. Please correlate with clinical findings and additional workup is recommended when appropriate if there is no prior. Lungs/Pleura: Breathing motion. Centrilobular emphysematous changes are seen. There is some paraseptal changes at the apices with bullous/bleb formation as well. Bilateral lower lobe parenchymal opacities are seen, right-greater-than-left. There is significant debris in the right lower lobe bronchi. Please correlate for etiology. Please correlate for any history of aspiration. Musculoskeletal: Scattered degenerative changes along the spine. There is an area of irregular endplate at the disc space at L1-2. Both endplates are involved. Mild adjacent edema. This was not seen on the prior examination and with the change would be worrisome for etiology such as discitis and osteomyelitis. Recommend further evaluation. CT ABDOMEN PELVIS FINDINGS Hepatobiliary: No focal liver abnormality is seen. No gallstones, gallbladder wall thickening, or biliary dilatation. Patent portal vein. Pancreas: Unremarkable. No pancreatic ductal dilatation or surrounding inflammatory changes. Spleen: Normal in size without focal abnormality. Adrenals/Urinary Tract: Adrenal glands are preserved. Mild bilateral renal atrophy. There are some benign bilateral renal cystic foci identified. There also is a complex lesion along the lower pole of the right kidney which has mixed densities as well as some calcification. This lesion has diameter approaching 2.4 x 2.4 cm on series 2, image 79. On previous examination this measured 2.4 by 2.4 cm. Cystic neoplasm of the kidney is possible. Additional calcified lesion more superiorly is more benign on series 2, image 69. contracted urinary bladder. Bladder wall slightly  thickened. Stomach/Bowel: On this non oral contrast exam large bowel has a normal course and caliber with scattered colonic stool. Small bowel is nondilated. Stomach is distended with air and fluid. Vascular/Lymphatic: Extensive vascular calcifications. There is some noncalcified plaque as well along the aorta and branch vessels. Diameter of the inferior abdominal aorta is dilated at 3.2 x 2.9 cm today. Previously 3.1 cm. Additional dilatation of the common iliac arteries. On the right measuring up to 2.1 cm and left 1.8 cm. Significant plaque as well along the common iliac artery bifurcation on the left with potential significant stenosis. Please correlate with claudication symptoms. No specific abnormal lymph node enlargement identified in the abdomen and pelvis. There is also occlusion of the right SFA and severe disease along the left. Reproductive: Prostate is unremarkable. Other: Question high-riding testicles along the inguinal canal regions. Please correlate with physical exam. Anasarca. No free air or free fluid. Evaluation limited by motion as well as streak artifact as the arms were scanned at the patient's side. Musculoskeletal: Streak artifact related to the dynamic right hip screw. Curvature of the spine with degenerative changes. Again please see above discussion regarding the potential area of discitis and osteomyelitis at L1-2. IMPRESSION: Interval development of erosive changes surrounding the disc space at L1-2 with some fluid. Developing area  of osteomyelitis and discitis is possible. Recommend further evaluation such as MRI with and without contrast. Bilateral lower lobe lung parenchymal opacities, right-greater-than-left with debris along the course of the associated bronchi more on the right than left. Infiltrative process is possible. Please correlate for any clinical evidence aspiration. No bowel obstruction, free air.  Scattered colonic stool. Complex lesion as described previously along  lower pole of the right kidney. A cystic neoplasm of the kidney is possible. Further workup is recommended when appropriate. Extensive atherosclerotic changes identified diffusely. There is consent plaque and calcification along the aortic valve. 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 also a 3.2 cm in for abdominal aortic aneurysm. Previously 3.1 cm. Recommend follow-up ultrasound every 3 years. (Ref.: J Vasc Surg. 2018; 67:2-77 and J Am Coll Radiol 2013;10(10):789-794.) Additional dilatation of the common iliac arteries. Evaluation limited by motion and streak artifact. Emphysematous lung changes. Critical Value/emergent results were called by telephone at the time of interpretation on 08/20/2023 at 6:12 pm to provider [facility] , who verbally acknowledged these results. Electronically Signed   By: Karen Kays M.D.   On: 08/20/2023 18:15   CT Head Wo Contrast Result Date: 08/20/2023 CLINICAL DATA:  Mental status change EXAM: CT HEAD WITHOUT CONTRAST TECHNIQUE: Contiguous axial images were obtained from the base of the skull through the vertex without intravenous contrast. RADIATION DOSE REDUCTION: This exam was performed according to the departmental dose-optimization program which includes automated exposure control, adjustment of the mA and/or kV according to patient size and/or use of iterative reconstruction technique. COMPARISON:  In 1724 FINDINGS: Brain: Stable chronic small-vessel ischemic changes throughout the periventricular white matter, pons, and right basal ganglia. No evidence of acute infarct or hemorrhage. Lateral ventricles and remaining midline structures are stable. No acute extra-axial fluid collections. No mass effect. Vascular: No hyperdense vessel or unexpected calcification. Skull: Normal. Negative for fracture or focal lesion. Sinuses/Orbits: No acute finding. Other: None. IMPRESSION: 1. No  acute intracranial process. 2. Stable chronic small-vessel ischemic changes as above. Electronically Signed   By: Sharlet Salina M.D.   On: 08/20/2023 17:18   DG Chest 2 View Result Date: 08/20/2023 CLINICAL DATA:  Suspected Sepsis.  Altered mental status. EXAM: CHEST - 2 VIEW COMPARISON:  05/07/2023. FINDINGS: There is left retrocardiac vertically oriented opacity, which may represent fibrosis/scarring versus atelectasis. Bilateral lung fields are otherwise clear. No acute consolidation or lung collapse. Bilateral costophrenic angles are clear. Normal cardio-mediastinal silhouette. No acute osseous abnormalities. The soft tissues are within normal limits. IMPRESSION: No active cardiopulmonary disease. Electronically Signed   By: Jules Schick M.D.   On: 08/20/2023 16:53     Data Reviewed: Relevant notes from primary care and specialist visits, past discharge summaries as available in EHR, including Care Everywhere. Prior diagnostic testing as pertinent to current admission diagnoses Updated medications and problem lists for reconciliation ED course, including vitals, labs, imaging, treatment and response to treatment Triage notes, nursing and pharmacy notes and ED provider's notes Notable results as noted in HPI   Assessment and Plan: * Severe sepsis (HCC) Severe sepsis criteria include hypotension, tachycardia, leukocytosis, unresponsiveness multiple sources of infection Sources of infection seen on workup include UTI, pneumonia, discitis with osteomyelitis with psoas abscess so concern for bacteremia Sepsis fluids Broad-spectrum antibiotics Please see management of each etiology on the respective problem Consider ID consult for guidance  Aspiration pneumonia [facility]) Patient was unresponsive at  group home per triage note CT chest with contrast concerning for "Bilateral lower lobe lung parenchymal opacities, right-greater-than-left with debris along the course of the associated bronchi  more on the right than left. Infiltrative process is possible. Please correlate for any clinical evidence aspiration" -Continue cefepime and vancomycin - Aspiration precautions - Will keep n.p.o. 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 Fall and aspiration precautions SLP consult  Chronic diastolic CHF (congestive heart failure) (HCC) Clinically euvolemic to dry - Monitor for fluid overload given patient is receiving sepsis fluids - Daily weights  COPD (chronic obstructive pulmonary disease) (HCC) Not acutely exacerbated DuoNebs as needed  Hypertension While n.p.o. we will treat with hydralazine IV as needed  Hyperthyroidism Continue methimazole  Thrombocytopenia (HCC) Possibly secondary to sepsis Continue to monitor  AAA (abdominal aortic aneurysm) (HCC) Aortic atherosclerosis Will need to be on statin and antiplatelet.  Med rec is pending  Bipolar disorder (HCC) Schizophrenia Dementia without behavioral disturbance Continue home meds, awaiting med list from facility Delirium precautions     DVT prophylaxis: Lovenox  Consults: none  Advance Care Planning:   Code Status: Prior   Family Communication: none  Disposition Plan: Back to previous home environment  Severity of Illness: The appropriate patient status for this patient is INPATIENT. Inpatient status is judged to be reasonable and necessary in order to provide the required intensity of service to ensure the patient's safety. The patient's presenting symptoms, physical  exam findings, and initial radiographic and laboratory data in the context of their chronic comorbidities is felt to place them at high risk for further clinical deterioration. Furthermore, it is not anticipated that the patient will be medically stable for discharge from the hospital within 2 midnights of admission.   * I certify that at the point of admission it is my clinical judgment that the patient will require inpatient hospital care spanning beyond 2 midnights from the point of admission due to high intensity of service, high risk for further deterioration and high frequency of surveillance required.*  Author: Andris Baumann, MD 08/21/2023 12:43 AM  For on call review [email or web address].

## 2023-08-21 NOTE — Assessment & Plan Note (Addendum)
Mental status improved and able to answer questions.

## 2023-08-21 NOTE — Assessment & Plan Note (Addendum)
Schizophrenia Dementia without behavioral disturbance Restarted Risperdal tonight and Celexa tomorrow

## 2023-08-21 NOTE — Assessment & Plan Note (Addendum)
Severe sepsis criteria include hypotension, tachycardia, leukocytosis, acute metabolic encephalopathy, lactic acidosis, discitis and osteomyelitis Patient on meropenem and vancomycin.  Appreciate ID consultation.  Interventional radiology did a culture of discitis on 08/22/23.  ESBL E. coli growing out of discitis culture.

## 2023-08-21 NOTE — Assessment & Plan Note (Signed)
On Risperdal at night and Celexa during the day

## 2023-08-21 NOTE — Assessment & Plan Note (Addendum)
 Ruled out  Urine culture negative

## 2023-08-21 NOTE — Assessment & Plan Note (Addendum)
Continue cefepime and vancomycin

## 2023-08-21 NOTE — Assessment & Plan Note (Addendum)
Right psoas abscess, 12 to 14mm History of right hip repair 05/2023 in the setting of right hip cellulitis Continue vancomycin and cefepime Interventional radiology to obtain cultures of discitis on 08/22/2023. Will get ID consultation tomorrow.

## 2023-08-21 NOTE — Progress Notes (Signed)
Pharmacy Antibiotic Note  [name] is a 73 y.o. male admitted on 08/20/2023 with sepsis.  Pharmacy has been consulted for Vanc, Cefepime dosing.  Pt current height and weight not available b/c pt is bedbound, will use ht and wt from encounter on 05/2023.  Ht = 69 " Wt = 71.4 kg CrCl = ~ 84 ml/min   Plan: Cefepime 2 gm IV X 1 given in ED on 1/31 @ 1659. Cefepime 2 gm Q8H ordered to continue on 2/1 @ 0400.  Vancomycin 1 gm IV X 1 given in ED on 1/31 @ 1858.  Additional Vanc 750 mg IV X 1 ordered for 2/1 @ ~ 0400 to make total loading dose of 1750 mg. Vancomycin 1 gm IV Q12H ordered to start on 2/1 @ 0700.  AUC = 528 Vanc trough = 14.7   Height: 5\' 6"  (167.6 cm) IBW/kg (Calculated) : 63.8  Temp (24hrs), Avg:97.9 F (36.6 C), Min:97.8 F (36.6 C), Max:98.1 F (36.7 C)  Recent Labs  Lab 08/20/23 1542 08/20/23 1713 08/20/23 1847 08/20/23 2100  WBC QUESTIONABLE IDENTIFICATION / INCORRECTLY LABELED SPECIMEN  --  11.3*  --   CREATININE QUESTIONABLE IDENTIFICATION / INCORRECTLY LABELED SPECIMEN  --   --  0.75  LATICACIDVEN QUESTIONABLE IDENTIFICATION / INCORRECTLY LABELED SPECIMEN QUESTIONABLE IDENTIFICATION / INCORRECTLY LABELED SPECIMEN 2.1* 1.3    CrCl cannot be calculated (Unknown ideal weight.).    Allergies  Allergen Reactions   Asa [Aspirin]     Hx GI bleed    Antimicrobials this admission:   >>    >>   Dose adjustments this admission:   Microbiology results:  BCx:   UCx:    Sputum:    MRSA PCR:   Thank you for allowing pharmacy to be a part of this patient's care.  Yvette Roark D 08/21/2023 4:06 AM

## 2023-08-21 NOTE — Assessment & Plan Note (Signed)
-  Continue methimazole ?

## 2023-08-21 NOTE — Progress Notes (Signed)
Progress Note   Patient: [name] WJX:[id number] DOB: Apr 23, 1951 DOA: 08/20/2023     1 DOS: the patient was seen and examined on 08/21/2023   Brief hospital course: 73 year old man past medical history of dementia, bipolar disorder, hypertension, COPD, diastolic congestive heart failure, stroke, hypothyroidism, ESBL UTI and history of right hip fracture with local cellulitis in November 2024.  In the ER he was hypotensive and tachycardic with an elevated white count and lactic acid.  CT scan showed cystic lesion right kidney, discitis possible osteomyelitis L1-L2, significant stenosis iliac vessels with occlusion of the right SFA and 3.2 cm abdominal aortic aneurysm  MRI showed discitis osteomyelitis L1-L2 with abscess in the right psoas muscle  2/1.  Case discussed with interventional radiology to evaluate for drainage of abscess.  Assessment and Plan: * Severe sepsis (HCC) Severe sepsis criteria include hypotension, tachycardia, leukocytosis, acute metabolic encephalopathy, lactic acidosis discitis and osteomyelitis Sources of infection seen on workup include UTI, pneumonia, discitis with osteomyelitis with psoas abscess. Patient on cefepime and vancomycin.  Follow-up cultures. Interventional radiology consulted for biopsy of psoas muscle abscess.  Aspiration pneumonia (HCC) -Continue cefepime and vancomycin   Psoas abscess, right [facility]) Interventional radiology to get culture of psoas muscle abscess.  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  Urinary tract infection History of ESBL UTI Continue current antibiotic of cefepime  Acute metabolic encephalopathy Mental status improved and able to answer questions.  Chronic diastolic CHF (congestive heart  failure) (HCC) Clinically euvolemic to dry   COPD (chronic obstructive pulmonary disease) (HCC) Not acutely exacerbated DuoNebs as needed  Hypertension Continue to monitor blood pressure  Hyperthyroidism Continue methimazole  Thrombocytopenia (HCC) Possibly secondary to sepsis Continue to monitor  AAA (abdominal aortic aneurysm) (HCC) Aortic atherosclerosis Will restart statin tomorrow.  Dementia without behavioral disturbance (HCC) On Risperdal at night and Celexa during the day  Bipolar disorder (HCC) Schizophrenia Dementia without behavioral disturbance Restart Risperdal tonight and Celexa tomorrow  PVD (peripheral vascular disease) (HCC) Patient also has AAA.  Will need vascular follow-up likely as outpatient.  Will need to be on antiplatelet agent but will get biopsy first.        Subjective: Patient does not recall why he came in.  Patient able to straight leg raise bilaterally.  Does not complain of any back pain.  Brought in with altered mental status and found to have discitis/osteomyelitis and psoas muscle abscess.  Physical Exam: Vitals:   08/21/23 0025 08/21/23 0334 08/21/23 0335 08/21/23 1326  BP:  (!) 154/80 (!) 154/80   Pulse:   86   Resp:   16   Temp: 98 F (36.7 C)  98.1 F (36.7 C)   TempSrc: Oral  Oral   SpO2:   95%   Weight:    68.9 kg  Height:       Physical Exam HENT:     Head: Normocephalic.     Mouth/Throat:     Pharynx: No oropharyngeal exudate.  Eyes:     General: Lids are normal.     Conjunctiva/sclera: Conjunctivae normal.  Cardiovascular:     Rate and Rhythm: Normal rate and regular rhythm.     Heart sounds: Normal heart sounds, S1 normal and S2 normal.  Pulmonary:     Breath sounds: No decreased breath sounds,  wheezing, rhonchi or rales.  Abdominal:     Palpations: Abdomen is soft.     Tenderness: There is no abdominal tenderness.  Musculoskeletal:     Right lower leg: No swelling.     Left lower leg: No swelling.   Skin:    General: Skin is warm.     Comments: Erythema right hip area  Neurological:     Mental Status: He is alert.     Comments: Patient able to answer questions and follows some simple commands.  Patient able to straight right leg raise     Data Reviewed: Creatinine 0.59 lactic acid 2.1 on presentation down to 1.3, white blood count down to 6.6, hemoglobin 11.1, platelet count 127  Family Communication: Spoke with patient's primary contact  Disposition: Status is: Inpatient Remains inpatient appropriate because: Interventional radiology to evaluate for abscess drainage  Planned Discharge Destination: Group home    Time spent: 28 minutes Case discussed with interventional radiology.  Author: Alford Highland, MD 08/21/2023 2:05 PM  For on call review [email or web address].

## 2023-08-21 NOTE — Hospital Course (Signed)
73 year old man past medical history of dementia, bipolar disorder, hypertension, COPD, diastolic congestive heart failure, stroke, hypothyroidism, ESBL UTI and history of right hip fracture with local cellulitis in November 2024.  In the ER he was hypotensive and tachycardic with an elevated white count and lactic acid.  CT scan showed cystic lesion right kidney, discitis possible osteomyelitis L1-L2, significant stenosis iliac vessels with occlusion of the right SFA and 3.2 cm abdominal aortic aneurysm  MRI showed discitis osteomyelitis L1-L2 with abscess in the right psoas muscle  2/1.  Case discussed with interventional radiology to evaluate for drainage of abscess. 2/2.  Patient had a drainage procedure today.  Patient feels okay. 2/3.  So far culture does not show any organisms but reintubated for better growth.

## 2023-08-21 NOTE — Assessment & Plan Note (Addendum)
Aortic atherosclerosis Will switch Zocor to Lipitor

## 2023-08-21 NOTE — Assessment & Plan Note (Signed)
Interventional radiology unable to get culture of this.

## 2023-08-21 NOTE — Assessment & Plan Note (Addendum)
Patient also has AAA.  Will need vascular follow-up likely as outpatient.  Will need to be on antiplatelet agent but will get biopsy first.

## 2023-08-21 NOTE — Assessment & Plan Note (Addendum)
Clinically euvolemic to dry

## 2023-08-21 NOTE — Assessment & Plan Note (Signed)
Not acutely exacerbated DuoNebs as needed

## 2023-08-21 NOTE — Assessment & Plan Note (Signed)
Secondary to sepsis.  Platelet count 126.

## 2023-08-21 NOTE — Progress Notes (Signed)
SLP Cancellation Note  Patient Details Name: Tore Carreker MRN: [id number] DOB: 1951/07/16   Cancelled treatment:       Reason Eval/Treat Not Completed: Medical issues which prohibited therapy;Other (comment) (Pt NPO for procedure). SLP will follow up for bedside swallow assessment following procedure, when pt is available/ready and as schedule allows.   [place] Keontay Vora Clapp  MS [facility] SLP    [place] J Clapp 08/21/2023, 9:18 AM

## 2023-08-22 ENCOUNTER — Inpatient Hospital Stay: Payer: Medicare HMO

## 2023-08-22 DIAGNOSIS — M4646 Discitis, unspecified, lumbar region: Secondary | ICD-10-CM | POA: Diagnosis not present

## 2023-08-22 DIAGNOSIS — A419 Sepsis, unspecified organism: Secondary | ICD-10-CM | POA: Diagnosis not present

## 2023-08-22 DIAGNOSIS — I5032 Chronic diastolic (congestive) heart failure: Secondary | ICD-10-CM | POA: Diagnosis not present

## 2023-08-22 DIAGNOSIS — K6812 Psoas muscle abscess: Secondary | ICD-10-CM | POA: Diagnosis not present

## 2023-08-22 DIAGNOSIS — I10 Essential (primary) hypertension: Secondary | ICD-10-CM

## 2023-08-22 LAB — CBC
HCT: 35 % — ABNORMAL LOW (ref 39.0–52.0)
Hemoglobin: 11 g/dL — ABNORMAL LOW (ref 13.0–17.0)
MCH: 27 pg (ref 26.0–34.0)
MCHC: 31.4 g/dL (ref 30.0–36.0)
MCV: 86 fL (ref 80.0–100.0)
Platelets: 124 10*3/uL — ABNORMAL LOW (ref 150–400)
RBC: 4.07 MIL/uL — ABNORMAL LOW (ref 4.22–5.81)
RDW: 15.9 % — ABNORMAL HIGH (ref 11.5–15.5)
WBC: 6.6 10*3/uL (ref 4.0–10.5)
nRBC: 0 % (ref 0.0–0.2)

## 2023-08-22 LAB — BASIC METABOLIC PANEL
Anion gap: 11 (ref 5–15)
BUN: 10 mg/dL (ref 8–23)
CO2: 22 mmol/L (ref 22–32)
Calcium: 9.1 mg/dL (ref 8.9–10.3)
Chloride: 102 mmol/L (ref 98–111)
Creatinine, Ser: 0.57 mg/dL — ABNORMAL LOW (ref 0.61–1.24)
GFR, Estimated: >60 mL/min (ref >60)
Glucose, Bld: 79 mg/dL (ref 70–99)
Potassium: 3.5 mmol/L (ref 3.5–5.1)
Sodium: 135 mmol/L (ref 135–145)

## 2023-08-22 MED ORDER — FENTANYL CITRATE (PF) 100 MCG/2ML IJ SOLN
INTRAMUSCULAR | Status: AC
  Filled 2023-08-22: qty 2

## 2023-08-22 MED ORDER — FENTANYL CITRATE (PF) 100 MCG/2ML IJ SOLN
INTRAMUSCULAR | Status: AC | PRN
  Administered 2023-08-22: 12.5 ug via INTRAVENOUS
  Administered 2023-08-22: 25 ug via INTRAVENOUS

## 2023-08-22 MED ORDER — LIDOCAINE HCL 1 % IJ SOLN
INTRAMUSCULAR | Status: AC | PRN
  Administered 2023-08-22: 10 mL

## 2023-08-22 MED ORDER — MIDAZOLAM HCL 2 MG/2ML IJ SOLN
INTRAMUSCULAR | Status: AC
  Filled 2023-08-22: qty 4

## 2023-08-22 MED ORDER — MIDAZOLAM HCL 2 MG/2ML IJ SOLN
INTRAMUSCULAR | Status: AC | PRN
  Administered 2023-08-22 (×2): .5 mg via INTRAVENOUS

## 2023-08-22 MED ORDER — VANCOMYCIN HCL 1750 MG/350ML IV SOLN
1750.0000 mg | INTRAVENOUS | Status: DC
  Administered 2023-08-22 – 2023-08-23 (×2): 1750 mg via INTRAVENOUS
  Filled 2023-08-22 (×5): qty 350

## 2023-08-22 MED ORDER — ATORVASTATIN CALCIUM 20 MG PO TABS
40.0000 mg | ORAL_TABLET | Freq: Every evening | ORAL | Status: DC
  Administered 2023-08-22 – 2023-08-31 (×10): 40 mg via ORAL
  Filled 2023-08-22 (×10): qty 2

## 2023-08-22 MED ORDER — HALOPERIDOL LACTATE 5 MG/ML IJ SOLN
1.0000 mg | Freq: Four times a day (QID) | INTRAMUSCULAR | Status: DC | PRN
  Administered 2023-08-22: 1 mg via INTRAVENOUS
  Filled 2023-08-22: qty 1

## 2023-08-22 MED ORDER — AMLODIPINE BESYLATE 5 MG PO TABS
5.0000 mg | ORAL_TABLET | Freq: Every day | ORAL | Status: DC
Start: 2023-08-22 — End: 2023-09-01
  Administered 2023-08-22 – 2023-09-01 (×11): 5 mg via ORAL
  Filled 2023-08-22 (×11): qty 1

## 2023-08-22 NOTE — ED Notes (Signed)
Purewick removed due to pt pulling it off and continuing to urinate on himself. Pt brief and bedding changed.

## 2023-08-22 NOTE — ED Notes (Signed)
Pt cleaned of incontinence

## 2023-08-22 NOTE — ED Notes (Signed)
Pt cleaned and brief changed. Vitals are stable. Will continue to montior

## 2023-08-22 NOTE — Progress Notes (Signed)
Pharmacy Antibiotic Note  [name] is a 73 y.o. male admitted on 08/20/2023 with sepsis.  Pharmacy has been consulted for Vanc, Cefepime dosing.  Pt current height and weight not available b/c pt is bedbound, will use ht and wt from encounter on 05/2023.   Plan: Continue cefepime 2 g q8H.   Pt is currently on vancomycin 1000 mg BID, this would give a AUC > 600, using Scr 0.8, Vd 0.72 IBW. Will switch to vancomycin 1750 mg q24H for a predicted AUC of 527. Goal AUC 400-600. Plan to obtain vancomycin level after 4th or 5th dose.   Height: 5\' 6"  (167.6 cm) Weight: 68.9 kg (152 lb) IBW/kg (Calculated) : 63.8  Temp (24hrs), Avg:98.1 F (36.7 C), Min:97.9 F (36.6 C), Max:98.2 F (36.8 C)  Recent Labs  Lab 08/20/23 1542 08/20/23 1713 08/20/23 1847 08/20/23 2100 08/21/23 0640 08/22/23 0616  WBC QUESTIONABLE IDENTIFICATION / INCORRECTLY LABELED SPECIMEN  --  11.3*  --  6.6 6.6  CREATININE QUESTIONABLE IDENTIFICATION / INCORRECTLY LABELED SPECIMEN  --   --  0.75 0.59* 0.57*  LATICACIDVEN QUESTIONABLE IDENTIFICATION / INCORRECTLY LABELED SPECIMEN QUESTIONABLE IDENTIFICATION / INCORRECTLY LABELED SPECIMEN 2.1* 1.3  --   --     Estimated Creatinine Clearance: 75.3 mL/min (A) (by C-G formula based on SCr of 0.57 mg/dL (L)).    Allergies  Allergen Reactions   Asa [Aspirin]     Hx GI bleed     Thank you for allowing pharmacy to be a part of this patient's care.  [name], PharmD, BCPS 08/22/2023 10:03 AM

## 2023-08-22 NOTE — ED Notes (Signed)
Consult made for IV team to obtain IV on pt. There was a 22g documented upon my shift arrival, however on assessment, that IV was found to be pulled by pt and lying on the ground. I obtained another IV in the RAC and that IV has infiltrated and partially pulled out by pt. RN Candise Bowens obtained another IV in the left forearm with ultrasound and that IV has infiltrated as well.

## 2023-08-22 NOTE — Consult Note (Signed)
Chief Complaint: Patient was seen in consultation today for discitis and right psoas abscess  Referring Physician(s): Alford Highland, MD  Patient Status: [facility] - In-pt  History of Present Illness: 73 year old man past medical history of dementia, bipolar disorder, hypertension, COPD, diastolic congestive heart failure, stroke, hypothyroidism, ESBL UTI and history of right hip fracture with local cellulitis in November 2024.  In the ER he was hypotensive and tachycardic with an elevated white count and lactic acid.    MRI showed discitis osteomyelitis L1-L2 with abscess in the right psoas muscle. IR was consulted for image guided aspiration of right psoas abscess / L1-L2 discitis.  Past Medical History:  Diagnosis Date   Bipolar disorder (HCC)    COPD (chronic obstructive pulmonary disease) (HCC)    Depression    GERD (gastroesophageal reflux disease)    GIB (gastrointestinal bleeding)    Hyperlipidemia    Hypertension    Mild dementia (HCC)    Paranoid schizophrenia (HCC)    Smoker    Tobacco dependence     Past Surgical History:  Procedure Laterality Date   INTRAMEDULLARY (IM) NAIL INTERTROCHANTERIC Right 05/18/2023   Procedure: INTRAMEDULLARY (IM) NAIL INTERTROCHANTERIC;  Surgeon: Deeann Saint, MD;  Location: [facility] ORS;  Service: Orthopedics;  Laterality: Right;    Allergies: Asa [aspirin]  Medications: Prior to Admission medications   Medication Sig Start Date End Date Taking? Authorizing Provider  ascorbic acid (VITAMIN C) 500 MG tablet Take 1 tablet (500 mg total) by mouth 2 (two) times daily. 05/25/23   Enedina Finner, MD  Cholecalciferol (VITAMIN D3) 1000 units CAPS Take 1 capsule (1,000 Units total) by mouth daily. 08/05/17   Plonk, Chrissie Noa, MD  citalopram (CELEXA) 40 MG tablet Take 1 tablet (40 mg total) by mouth daily. 09/18/20   Zena Amos, MD  feeding supplement (ENSURE ENLIVE / ENSURE PLUS) LIQD Take 237 mLs by mouth 3 (three) times daily between meals.  05/25/23   Enedina Finner, MD  ferrous sulfate 325 (65 FE) MG tablet Take 1 tablet (325 mg total) by mouth daily with breakfast. 05/26/23   Enedina Finner, MD  HYDROcodone-acetaminophen (NORCO/VICODIN) 5-325 MG tablet Take 1-2 tablets by mouth every 6 (six) hours as needed for moderate pain (pain score 4-6). 05/25/23   Enedina Finner, MD  leptospermum manuka honey (MEDIHONEY) PSTE paste Apply 1 Application topically daily. 05/25/23   Enedina Finner, MD  methimazole (TAPAZOLE) 5 MG tablet Take 5 mg by mouth 3 (three) times daily. 01/11/23   [provider]  Multiple Vitamin (MULTIVITAMIN WITH MINERALS) TABS tablet Take 1 tablet by mouth daily. 05/25/23   Enedina Finner, MD  Nystatin (GERHARDT'S BUTT CREAM) CREA Apply 1 Application topically 3 (three) times daily. 05/25/23   Enedina Finner, MD  risperiDONE (RISPERDAL) 1 MG tablet Take 1 tablet (1 mg total) by mouth daily. 09/18/20   Zena Amos, MD  senna (SENOKOT) 8.6 MG TABS tablet Take 1 tablet (8.6 mg total) by mouth 2 (two) times daily. 05/25/23   Enedina Finner, MD  simvastatin (ZOCOR) 20 MG tablet TAKE 1 TABLET BY MOUTH DAILY 05/31/17   Plonk, Chrissie Noa, MD  tamsulosin (FLOMAX) 0.4 MG CAPS capsule TAKE 1 CAPSULE BY MOUTH DAILY AFTER SUPPER 12/11/16   Plonk, Chrissie Noa, MD     Family History  Family history unknown: Yes    Social History   Socioeconomic History   Marital status: Single    Spouse name: Not on file   Number of children: Not on file   Years of education:  Not on file   Highest education level: Not on file  Occupational History   Not on file  Tobacco Use   Smoking status: Every Day    Current packs/day: 1.00    Types: Cigarettes   Smokeless tobacco: Never  Vaping Use   Vaping status: Never Used  Substance and Sexual Activity   Alcohol use: No    Alcohol/week: 0.0 standard drinks of alcohol   Drug use: No   Sexual activity: Never  Other Topics Concern   Not on file  Social History Narrative   Not on file   Social Drivers of Health    Financial Resource Strain: Not on file  Food Insecurity: Patient Unable To Answer (05/06/2023)   Hunger Vital Sign    Worried About Running Out of Food in the Last Year: Patient unable to answer    Ran Out of Food in the Last Year: Patient unable to answer  Transportation Needs: Patient Unable To Answer (05/19/2023)   PRAPARE - Administrator, Civil Service (Medical): Patient unable to answer    Lack of Transportation (Non-Medical): Patient unable to answer  Physical Activity: Not on file  Stress: Not on file  Social Connections: Not on file     Review of Systems  Vital Signs: BP (!) 183/92   Pulse 83   Temp 98.2 F (36.8 C) (Axillary)   Resp 20   Ht 5\' 6"  (1.676 m)   Wt 68.9 kg   SpO2 95%   BMI 24.53 kg/m     Physical Exam Constitutional:      Comments: Can follow commands but confused  Cardiovascular:     Rate and Rhythm: Normal rate.  Pulmonary:     Breath sounds: Rales present.  Abdominal:     General: Abdomen is flat.  Neurological:     Mental Status: He is alert.     Imaging: MR Lumbar Spine W Wo Contrast Result Date: 08/20/2023 CLINICAL DATA:  Back pain with infection suspected. EXAM: MRI THORACIC AND LUMBAR SPINE WITHOUT AND WITH CONTRAST TECHNIQUE: Multiplanar and multiecho pulse sequences of the thoracic and lumbar spine were obtained without and with intravenous contrast. CONTRAST:  7mL GADAVIST GADOBUTROL 1 MMOL/ML IV SOLN COMPARISON:  None Available. FINDINGS: MRI THORACIC SPINE FINDINGS Alignment:  Physiologic. Vertebrae: No fracture, evidence of discitis, or bone lesion. Cord:  Normal signal and morphology. Paraspinal and other soft tissues: Negative. Disc levels: No spinal canal stenosis. MRI LUMBAR SPINE FINDINGS Segmentation:  Standard Alignment:  Physiologic. Vertebrae: There is abnormal signal of the bone marrow at L1 and L2 with edema and contrast enhancement of the disc space. No epidural fluid collection. Conus medullaris: Extends  to the L1 level and appears normal. Paraspinal and other soft tissues: Fluid collection within the right psoas muscle at the L1-2 level measuring 14 x 12 mm. Disc levels: L1-L2: Edema within the disc space as above. No spinal canal stenosis. Mild severe bilateral neural foraminal stenosis. L2-L3: Small disc bulge. No spinal canal stenosis. No neural foraminal stenosis. L3-L4: Small disc bulge. Narrowing of both lateral recesses without central spinal canal stenosis. No neural foraminal stenosis. L4-L5: Intermediate sized right asymmetric disc bulge. Right lateral recess narrowing without central spinal canal stenosis. No neural foraminal stenosis. L5-S1: Small right subarticular disc protrusion with annular fissure. Right lateral recess narrowing without central spinal canal stenosis. No neural foraminal stenosis. Visualized sacrum: Normal. IMPRESSION: 1. Findings consistent with discitis-osteomyelitis at L1-L2 with 14 x 12 mm abscess in the  right psoas muscle. No epidural fluid collection. 2. Mild-to-moderate bilateral L1-L2 neural foraminal stenosis. 3. Narrowing of the lateral recesses bilaterally at L3-L4, on the right at L4-L5 and on the right at L5-S1. 4. No spinal canal stenosis. Electronically Signed   By: Deatra Robinson M.D.   On: 08/20/2023 21:48   MR THORACIC SPINE W WO CONTRAST Result Date: 08/20/2023 CLINICAL DATA:  Back pain with infection suspected. EXAM: MRI THORACIC AND LUMBAR SPINE WITHOUT AND WITH CONTRAST TECHNIQUE: Multiplanar and multiecho pulse sequences of the thoracic and lumbar spine were obtained without and with intravenous contrast. CONTRAST:  7mL GADAVIST GADOBUTROL 1 MMOL/ML IV SOLN COMPARISON:  None Available. FINDINGS: MRI THORACIC SPINE FINDINGS Alignment:  Physiologic. Vertebrae: No fracture, evidence of discitis, or bone lesion. Cord:  Normal signal and morphology. Paraspinal and other soft tissues: Negative. Disc levels: No spinal canal stenosis. MRI LUMBAR SPINE FINDINGS  Segmentation:  Standard Alignment:  Physiologic. Vertebrae: There is abnormal signal of the bone marrow at L1 and L2 with edema and contrast enhancement of the disc space. No epidural fluid collection. Conus medullaris: Extends to the L1 level and appears normal. Paraspinal and other soft tissues: Fluid collection within the right psoas muscle at the L1-2 level measuring 14 x 12 mm. Disc levels: L1-L2: Edema within the disc space as above. No spinal canal stenosis. Mild severe bilateral neural foraminal stenosis. L2-L3: Small disc bulge. No spinal canal stenosis. No neural foraminal stenosis. L3-L4: Small disc bulge. Narrowing of both lateral recesses without central spinal canal stenosis. No neural foraminal stenosis. L4-L5: Intermediate sized right asymmetric disc bulge. Right lateral recess narrowing without central spinal canal stenosis. No neural foraminal stenosis. L5-S1: Small right subarticular disc protrusion with annular fissure. Right lateral recess narrowing without central spinal canal stenosis. No neural foraminal stenosis. Visualized sacrum: Normal. IMPRESSION: 1. Findings consistent with discitis-osteomyelitis at L1-L2 with 14 x 12 mm abscess in the right psoas muscle. No epidural fluid collection. 2. Mild-to-moderate bilateral L1-L2 neural foraminal stenosis. 3. Narrowing of the lateral recesses bilaterally at L3-L4, on the right at L4-L5 and on the right at L5-S1. 4. No spinal canal stenosis. Electronically Signed   By: Deatra Robinson M.D.   On: 08/20/2023 21:48   CT CHEST ABDOMEN PELVIS W CONTRAST Result Date: 08/20/2023 CLINICAL DATA:  Sepsis EXAM: CT CHEST, ABDOMEN, AND PELVIS WITH CONTRAST TECHNIQUE: Multidetector CT imaging of the chest, abdomen and pelvis was performed following the standard protocol during bolus administration of intravenous contrast. RADIATION DOSE REDUCTION: This exam was performed according to the departmental dose-optimization program which includes automated exposure  control, adjustment of the mA and/or kV according to patient size and/or use of iterative reconstruction technique. CONTRAST:  OMNIPAQUE IOHEXOL 300 MG/ML  SOLN COMPARISON:  Chest x-ray 08/20/2023 and older. Abdomen pelvis CT 03/18/2023. FINDINGS: CT CHEST FINDINGS Cardiovascular: Heart is nonenlarged. No pericardial effusion. Coronary artery calcifications are seen. Is also calcifications along the aortic valve. The thoracic aorta overall has a normal course and caliber with scattered atherosclerotic calcified plaque. There is a bovine type aortic arch, normal variant. Mediastinum/Nodes: Slightly heterogeneous thyroid gland. Grossly normal course and caliber to the esophagus. No abnormal lymph node enlargement seen in the left axillary region, hilum or mediastinum. In the right axillary region is a presumed abnormal lymph node measuring 4.2 by 2.9 cm. This would have a differential. Please correlate with clinical findings and additional workup is recommended when appropriate if there is no prior. Lungs/Pleura: Breathing motion. Centrilobular emphysematous changes are  seen. There is some paraseptal changes at the apices with bullous/bleb formation as well. Bilateral lower lobe parenchymal opacities are seen, right-greater-than-left. There is significant debris in the right lower lobe bronchi. Please correlate for etiology. Please correlate for any history of aspiration. Musculoskeletal: Scattered degenerative changes along the spine. There is an area of irregular endplate at the disc space at L1-2. Both endplates are involved. Mild adjacent edema. This was not seen on the prior examination and with the change would be worrisome for etiology such as discitis and osteomyelitis. Recommend further evaluation. CT ABDOMEN PELVIS FINDINGS Hepatobiliary: No focal liver abnormality is seen. No gallstones, gallbladder wall thickening, or biliary dilatation. Patent portal vein. Pancreas: Unremarkable. No pancreatic  ductal dilatation or surrounding inflammatory changes. Spleen: Normal in size without focal abnormality. Adrenals/Urinary Tract: Adrenal glands are preserved. Mild bilateral renal atrophy. There are some benign bilateral renal cystic foci identified. There also is a complex lesion along the lower pole of the right kidney which has mixed densities as well as some calcification. This lesion has diameter approaching 2.4 x 2.4 cm on series 2, image 79. On previous examination this measured 2.4 by 2.4 cm. Cystic neoplasm of the kidney is possible. Additional calcified lesion more superiorly is more benign on series 2, image 69. contracted urinary bladder. Bladder wall slightly thickened. Stomach/Bowel: On this non oral contrast exam large bowel has a normal course and caliber with scattered colonic stool. Small bowel is nondilated. Stomach is distended with air and fluid. Vascular/Lymphatic: Extensive vascular calcifications. There is some noncalcified plaque as well along the aorta and branch vessels. Diameter of the inferior abdominal aorta is dilated at 3.2 x 2.9 cm today. Previously 3.1 cm. Additional dilatation of the common iliac arteries. On the right measuring up to 2.1 cm and left 1.8 cm. Significant plaque as well along the common iliac artery bifurcation on the left with potential significant stenosis. Please correlate with claudication symptoms. No specific abnormal lymph node enlargement identified in the abdomen and pelvis. There is also occlusion of the right SFA and severe disease along the left. Reproductive: Prostate is unremarkable. Other: Question high-riding testicles along the inguinal canal regions. Please correlate with physical exam. Anasarca. No free air or free fluid. Evaluation limited by motion as well as streak artifact as the arms were scanned at the patient's side. Musculoskeletal: Streak artifact related to the dynamic right hip screw. Curvature of the spine with degenerative changes.  Again please see above discussion regarding the potential area of discitis and osteomyelitis at L1-2. IMPRESSION: Interval development of erosive changes surrounding the disc space at L1-2 with some fluid. Developing area of osteomyelitis and discitis is possible. Recommend further evaluation such as MRI with and without contrast. Bilateral lower lobe lung parenchymal opacities, right-greater-than-left with debris along the course of the associated bronchi more on the right than left. Infiltrative process is possible. Please correlate for any clinical evidence aspiration. No bowel obstruction, free air.  Scattered colonic stool. Complex lesion as described previously along lower pole of the right kidney. A cystic neoplasm of the kidney is possible. Further workup is recommended when appropriate. Extensive atherosclerotic changes identified diffusely. There is consent plaque and calcification along the aortic valve. 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 also a 3.2 cm in for abdominal aortic aneurysm. Previously 3.1 cm. Recommend follow-up ultrasound every 3 years. (Ref.: J Vasc Surg. 2018; 67:2-77 and J Am Coll  Radiol 2013;10(10):789-794.) Additional dilatation of the common iliac arteries. Evaluation limited by motion and streak artifact. Emphysematous lung changes. Critical Value/emergent results were called by telephone at the time of interpretation on 08/20/2023 at 6:12 pm to provider [facility] , who verbally acknowledged these results. Electronically Signed   By: Karen Kays M.D.   On: 08/20/2023 18:15   CT Head Wo Contrast Result Date: 08/20/2023 CLINICAL DATA:  Mental status change EXAM: CT HEAD WITHOUT CONTRAST TECHNIQUE: Contiguous axial images were obtained from the base of the skull through the vertex without intravenous contrast. RADIATION DOSE REDUCTION: This exam was performed according to the  departmental dose-optimization program which includes automated exposure control, adjustment of the mA and/or kV according to patient size and/or use of iterative reconstruction technique. COMPARISON:  In 1724 FINDINGS: Brain: Stable chronic small-vessel ischemic changes throughout the periventricular white matter, pons, and right basal ganglia. No evidence of acute infarct or hemorrhage. Lateral ventricles and remaining midline structures are stable. No acute extra-axial fluid collections. No mass effect. Vascular: No hyperdense vessel or unexpected calcification. Skull: Normal. Negative for fracture or focal lesion. Sinuses/Orbits: No acute finding. Other: None. IMPRESSION: 1. No acute intracranial process. 2. Stable chronic small-vessel ischemic changes as above. Electronically Signed   By: Sharlet Salina M.D.   On: 08/20/2023 17:18   DG Chest 2 View Result Date: 08/20/2023 CLINICAL DATA:  Suspected Sepsis.  Altered mental status. EXAM: CHEST - 2 VIEW COMPARISON:  05/07/2023. FINDINGS: There is left retrocardiac vertically oriented opacity, which may represent fibrosis/scarring versus atelectasis. Bilateral lung fields are otherwise clear. No acute consolidation or lung collapse. Bilateral costophrenic angles are clear. Normal cardio-mediastinal silhouette. No acute osseous abnormalities. The soft tissues are within normal limits. IMPRESSION: No active cardiopulmonary disease. Electronically Signed   By: Jules Schick M.D.   On: 08/20/2023 16:53    Labs:  CBC: Recent Labs    08/20/23 1542 08/20/23 1847 08/21/23 0640 08/22/23 0616  WBC QUESTIONABLE IDENTIFICATION / INCORRECTLY LABELED SPECIMEN 11.3* 6.6 6.6  HGB QUESTIONABLE IDENTIFICATION / INCORRECTLY LABELED SPECIMEN 12.6* 11.1* 11.0*  HCT QUESTIONABLE IDENTIFICATION / INCORRECTLY LABELED SPECIMEN 39.6 35.0* 35.0*  PLT QUESTIONABLE IDENTIFICATION / INCORRECTLY LABELED SPECIMEN 123* 127* 124*    COAGS: Recent Labs    05/07/23 2306  05/17/23 0446 08/20/23 1713 08/20/23 1847  INR 1.3* 1.2 QUESTIONABLE IDENTIFICATION / INCORRECTLY LABELED SPECIMEN 1.1    BMP: Recent Labs    08/20/23 1542 08/20/23 2100 08/21/23 0640 08/22/23 0616  NA QUESTIONABLE IDENTIFICATION / INCORRECTLY LABELED SPECIMEN 138 138 135  K QUESTIONABLE IDENTIFICATION / INCORRECTLY LABELED SPECIMEN 4.0 3.7 3.5  CL QUESTIONABLE IDENTIFICATION / INCORRECTLY LABELED SPECIMEN 106 104 102  CO2 QUESTIONABLE IDENTIFICATION / INCORRECTLY LABELED SPECIMEN 23 25 22   GLUCOSE QUESTIONABLE IDENTIFICATION / INCORRECTLY LABELED SPECIMEN 115* 76 79  BUN QUESTIONABLE IDENTIFICATION / INCORRECTLY LABELED SPECIMEN 20 16 10   CALCIUM QUESTIONABLE IDENTIFICATION / INCORRECTLY LABELED SPECIMEN 9.0 9.2 9.1  CREATININE QUESTIONABLE IDENTIFICATION / INCORRECTLY LABELED SPECIMEN 0.75 0.59* 0.57*  GFRNONAA NOT CALCULATED >60 >60 >60    LIVER FUNCTION TESTS: Recent Labs    05/06/23 1150 05/17/23 0446 08/20/23 1542 08/20/23 2100  BILITOT 1.8* 0.9 1.7* 1.1  AST 20 20 QUESTIONABLE IDENTIFICATION / INCORRECTLY LABELED SPECIMEN 13*  ALT 13 15 QUESTIONABLE IDENTIFICATION / INCORRECTLY LABELED SPECIMEN 10  ALKPHOS 73 120 QUESTIONABLE IDENTIFICATION / INCORRECTLY LABELED SPECIMEN 89  PROT 7.4 7.9 QUESTIONABLE IDENTIFICATION / INCORRECTLY LABELED SPECIMEN 7.0  ALBUMIN 2.7* 2.5* QUESTIONABLE IDENTIFICATION / INCORRECTLY LABELED SPECIMEN 2.9*  TUMOR MARKERS: No results for input(s): "AFPTM", "CEA", "CA199", "CHROMGRNA" in the last 8760 hours.  Assessment and Plan:  73 yo with sepsis and evidence for discitis at L1-L2 with small right psoas abscess.  IR was consulted for abscess / disc aspiration.  Reviewed imaging and should be amendable to CT guided aspiration.   Consent obtained from care giver on telephone.  Plan for CT guided aspiration of right psoas abscess and/or L1-L2 disc with moderate sedation.    Electronically Signed: Arn Medal, MD 08/22/2023, 8:01  AM

## 2023-08-22 NOTE — Progress Notes (Signed)
Progress Note   Patient: [name] ZOX:[id number] DOB: 01-02-1951 DOA: 08/20/2023     2 DOS: the patient was seen and examined on 08/22/2023   Brief hospital course: 73 year old man past medical history of dementia, bipolar disorder, hypertension, COPD, diastolic congestive heart failure, stroke, hypothyroidism, ESBL UTI and history of right hip fracture with local cellulitis in November 2024.  In the ER he was hypotensive and tachycardic with an elevated white count and lactic acid.  CT scan showed cystic lesion right kidney, discitis possible osteomyelitis L1-L2, significant stenosis iliac vessels with occlusion of the right SFA and 3.2 cm abdominal aortic aneurysm  MRI showed discitis osteomyelitis L1-L2 with abscess in the right psoas muscle  2/1.  Case discussed with interventional radiology to evaluate for drainage of abscess. 2/2.  Patient had a drainage procedure today.  Patient feels okay.  Assessment and Plan: * Severe sepsis (HCC) Severe sepsis criteria include hypotension, tachycardia, leukocytosis, acute metabolic encephalopathy, lactic acidosis discitis and osteomyelitis Patient on cefepime and vancomycin.  Follow-up cultures. Interventional radiology done a culture of discitis on 08/22/23.  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 Will get ID consultation tomorrow.  Psoas abscess [facility]) Interventional radiology unable to get culture of this.  Aspiration pneumonia (HCC) Continue cefepime and vancomycin   Chronic diastolic CHF (congestive heart failure) (HCC) Clinically euvolemic to dry   Acute metabolic encephalopathy Mental status improved and able to answer questions.  Hypertension Start Norvasc today with blood pressure  rising.  Thrombocytopenia (HCC) Secondary to sepsis.  Platelet count 124.  Hyperthyroidism Continue methimazole  AAA (abdominal aortic aneurysm) (HCC) Aortic atherosclerosis Will switch Zocor to Lipitor  PVD (peripheral vascular disease) (HCC) Patient also has AAA.  Will need vascular follow-up likely as outpatient.  Placed on Lipitor.  Aspirin allergy listed in the computer.  Since patient had biopsy today we will hold off on any antiplatelet agent.  Bipolar disorder (HCC) Schizophrenia Dementia without behavioral disturbance Restarted Risperdal tonight and Celexa tomorrow  COPD (chronic obstructive pulmonary disease) (HCC) Not acutely exacerbated DuoNebs as needed  Urinary tract infection Urine culture negative  Dementia without behavioral disturbance (HCC) On Risperdal at night and Celexa during the day       Subjective: Patient feels okay.  Offers no complaints.  Told the patient we need to continue IV antibiotics and not to pull out the IV.  Admitted with discitis and osteomyelitis of the back and psoas muscle abscess.  Physical Exam: Vitals:   08/22/23 0909 08/22/23 0920 08/22/23 1046 08/22/23 1047  BP: (!) 164/100 (!) 153/99  (!) 195/104  Pulse: 91 93  (!) 111  Resp: 15 14  13   Temp:   98 F (36.7 C)   TempSrc:   Oral   SpO2: 96% 97%  100%  Weight:      Height:       Physical Exam HENT:     Head: Normocephalic.     Mouth/Throat:     Pharynx: No oropharyngeal exudate.  Eyes:     General: Lids are normal.     Conjunctiva/sclera: Conjunctivae normal.  Cardiovascular:     Rate and Rhythm: Normal rate and regular rhythm.     Heart sounds: Normal heart sounds, S1 normal and S2 normal.  Pulmonary:     Breath sounds: No  decreased breath sounds, wheezing, rhonchi or rales.  Abdominal:     Palpations: Abdomen is soft.     Tenderness: There is no abdominal tenderness.  Musculoskeletal:     Right lower leg: No swelling.     Left lower leg: No swelling.   Skin:    General: Skin is warm.  Neurological:     Mental Status: He is alert.     Comments: Patient able to answer questions and follows some simple commands.  Patient able to straight right leg raise     Data Reviewed: Creatinine 0.57, sodium 135, potassium 3.5, white blood count 6.6, hemoglobin 11.0, platelet count 124  Family Communication: Left message for Timmy  Disposition: Status is: Inpatient Remains inpatient appropriate because: With discitis osteomyelitis will get ID consultation tomorrow.  Planned Discharge Destination: To be determined    Time spent: 28 minutes  Author: Alford Highland, MD 08/22/2023 12:44 PM  For on call review [email or web address].

## 2023-08-22 NOTE — ED Notes (Signed)
Pt brief changed and pt bottom wiped clean. Skin barrier put on pt bottom due to frequent urination.

## 2023-08-22 NOTE — ED Notes (Signed)
Pt bedding and brief changed. Skin barrier put on pt bottom due to frequent urination.

## 2023-08-22 NOTE — Evaluation (Signed)
Clinical/Bedside Swallow Evaluation Patient Details  Name: [name] MRN: [id number] Date of Birth: February 14, 1951  Today's Date: 08/22/2023 Time: SLP Start Time (ACUTE ONLY): 1235 SLP Stop Time (ACUTE ONLY): 1250 SLP Time Calculation (min) (ACUTE ONLY): 15 min  Past Medical History:  Past Medical History:  Diagnosis Date   Bipolar disorder (HCC)    COPD (chronic obstructive pulmonary disease) (HCC)    Depression    GERD (gastroesophageal reflux disease)    GIB (gastrointestinal bleeding)    Hyperlipidemia    Hypertension    Mild dementia (HCC)    Paranoid schizophrenia (HCC)    Smoker    Tobacco dependence    Past Surgical History:  Past Surgical History:  Procedure Laterality Date   INTRAMEDULLARY (IM) NAIL INTERTROCHANTERIC Right 05/18/2023   Procedure: INTRAMEDULLARY (IM) NAIL INTERTROCHANTERIC;  Surgeon: Deeann Saint, MD;  Location: [facility] ORS;  Service: Orthopedics;  Laterality: Right;   HPI:  73 year old man past medical history of dementia, bipolar disorder, hypertension, COPD, diastolic congestive heart failure, stroke, hypothyroidism, ESBL UTI and history of right hip fracture with local cellulitis in November 2024.  In the ER on 08/20/2023  he was hypotensive and tachycardic with an elevated white count and lactic acid. CT scan showed cystic lesion right kidney, discitis possible osteomyelitis L1-L2, significant stenosis iliac vessels with occlusion of the right SFA and 3.2 cm abdominal aortic aneurysm MRI showed discitis osteomyelitis L1-L2 with abscess in the right psoas muscle Bilateral lower lobe lung parenchymal opacities,  right-greater-than-left with debris along the course of the  associated bronchi more on the right than left. Infiltrative process is possible. Please correlate for any clinical evidence aspiration.    Assessment / Plan / Recommendation  Clinical Impression  Pt presents with a predominately oral phase dysphagia that is likely d/t decreased dentition  (pt observed with one tooth). When consuming the chopped meats and vegetables provided on his dysphagia 3 lunch tray, pt with extended mastication increasing his risk of choking. However, when provided with minced meat and vegetables, his oral phase appeared more functional and reduced. Pt's pharyngeal phase appeared functional with no overt s/s of aspiration observed when consuming thin liquids via cup or straw. Would recommend downgrade to dysphagia 2 diet with thin liquids, medicine whole in puree. ST services are not indicated at this time. SLP Visit Diagnosis: Dysphagia, oral phase (R13.11)    Aspiration Risk  Mild aspiration risk    Diet Recommendation Dysphagia 2 (Fine chop);Thin liquid    Liquid Administration via: Cup;Straw Medication Administration: Whole meds with puree Supervision: Staff to assist with self feeding;Full supervision/cueing for compensatory strategies Compensations: Minimize environmental distractions;Slow rate;Small sips/bites Postural Changes: Seated upright at 90 degrees;Remain upright for at least 30 minutes after po intake    Other  Recommendations Oral Care Recommendations: Oral care BID    Recommendations for follow up therapy are one component of a multi-disciplinary discharge planning process, led by the attending physician.  Recommendations may be updated based on patient status, additional functional criteria and insurance authorization.  Follow up Recommendations No SLP follow up      Assistance Recommended at Discharge    Functional Status Assessment Patient has had a recent decline in their functional status and/or demonstrates limited ability to make significant improvements in function in a reasonable and predictable amount of time  Frequency and Duration            Prognosis Prognosis for improved oropharyngeal function: Guarded Barriers to Reach Goals:  (lack of dentition)  Swallow Study   General Date of Onset: 08/20/23 HPI:  73 year old man past medical history of dementia, bipolar disorder, hypertension, COPD, diastolic congestive heart failure, stroke, hypothyroidism, ESBL UTI and history of right hip fracture with local cellulitis in November 2024.  In the ER on 08/20/2023  he was hypotensive and tachycardic with an elevated white count and lactic acid. CT scan showed cystic lesion right kidney, discitis possible osteomyelitis L1-L2, significant stenosis iliac vessels with occlusion of the right SFA and 3.2 cm abdominal aortic aneurysm MRI showed discitis osteomyelitis L1-L2 with abscess in the right psoas muscle Bilateral lower lobe lung parenchymal opacities,  right-greater-than-left with debris along the course of the  associated bronchi more on the right than left. Infiltrative process is possible. Please correlate for any clinical evidence aspiration. Type of Study: Bedside Swallow Evaluation Previous Swallow Assessment: none in chart Diet Prior to this Study: Dysphagia 3 (mechanical soft);Thin liquids (Level 0) Temperature Spikes Noted: No Respiratory Status: Room air History of Recent Intubation: No Behavior/Cognition: Alert;Confused;Distractible Oral Cavity Assessment: Within Functional Limits Oral Care Completed by SLP: No Oral Cavity - Dentition: Missing dentition Self-Feeding Abilities: Needs assist;Needs set up Patient Positioning: Upright in bed Baseline Vocal Quality: Normal Volitional Cough: Cognitively unable to elicit Volitional Swallow: Unable to elicit    Oral/Motor/Sensory Function Overall Oral Motor/Sensory Function: Within functional limits   Ice Chips Ice chips: Not tested   Thin Liquid Thin Liquid: Within functional limits Presentation: Self Fed;Cup;Straw    Nectar Thick Nectar Thick Liquid: Not tested   Honey Thick Honey Thick Liquid: Not tested   Puree Puree: Within functional limits Presentation: Spoon   Solid     Solid: Impaired Oral Phase Impairments: Reduced lingual  movement/coordination;Impaired mastication Oral Phase Functional Implications: Impaired mastication;Prolonged oral transit     Maverick Patman B. Dreama Saa, M.S., CCC-SLP, Tree surgeon Certified Brain Injury Specialist [facility]  [facility] Office [phone] Ascom [phone] Fax [phone]

## 2023-08-22 NOTE — Procedures (Signed)
Interventional Radiology Procedure:   Indications: Sepsis and discitis  Procedure: CT guided aspiration of right psoas and L1-L2 disc  Findings: Could not obtain any significant fluid from right psoas muscle.  20 gauge spinal needle advanced into L1-L2 disc space and aspirated 1 ml of bloody fluid.   Complications: None     EBL: Minimal  Plan: Bedrest 2 hours.  Fluid sent for culture.   Dianah Pruett R. Lowella Dandy, MD  Pager: [phone]

## 2023-08-23 DIAGNOSIS — B351 Tinea unguium: Secondary | ICD-10-CM

## 2023-08-23 DIAGNOSIS — I7143 Infrarenal abdominal aortic aneurysm, without rupture: Secondary | ICD-10-CM

## 2023-08-23 DIAGNOSIS — E0590 Thyrotoxicosis, unspecified without thyrotoxic crisis or storm: Secondary | ICD-10-CM | POA: Diagnosis not present

## 2023-08-23 DIAGNOSIS — K6812 Psoas muscle abscess: Secondary | ICD-10-CM | POA: Diagnosis not present

## 2023-08-23 DIAGNOSIS — I10 Essential (primary) hypertension: Secondary | ICD-10-CM | POA: Diagnosis not present

## 2023-08-23 DIAGNOSIS — M4646 Discitis, unspecified, lumbar region: Secondary | ICD-10-CM | POA: Diagnosis not present

## 2023-08-23 DIAGNOSIS — A419 Sepsis, unspecified organism: Secondary | ICD-10-CM | POA: Diagnosis not present

## 2023-08-23 DIAGNOSIS — M4620 Osteomyelitis of vertebra, site unspecified: Secondary | ICD-10-CM | POA: Diagnosis not present

## 2023-08-23 LAB — CBC
HCT: 35.3 % — ABNORMAL LOW (ref 39.0–52.0)
Hemoglobin: 11.4 g/dL — ABNORMAL LOW (ref 13.0–17.0)
MCH: 27.5 pg (ref 26.0–34.0)
MCHC: 32.3 g/dL (ref 30.0–36.0)
MCV: 85.1 fL (ref 80.0–100.0)
Platelets: 126 10*3/uL — ABNORMAL LOW (ref 150–400)
RBC: 4.15 MIL/uL — ABNORMAL LOW (ref 4.22–5.81)
RDW: 15.8 % — ABNORMAL HIGH (ref 11.5–15.5)
WBC: 5.3 10*3/uL (ref 4.0–10.5)
nRBC: 0 % (ref 0.0–0.2)

## 2023-08-23 LAB — BASIC METABOLIC PANEL
Anion gap: 11 (ref 5–15)
BUN: 8 mg/dL (ref 8–23)
CO2: 24 mmol/L (ref 22–32)
Calcium: 9.2 mg/dL (ref 8.9–10.3)
Chloride: 100 mmol/L (ref 98–111)
Creatinine, Ser: 0.57 mg/dL — ABNORMAL LOW (ref 0.61–1.24)
GFR, Estimated: >60 mL/min (ref >60)
Glucose, Bld: 79 mg/dL (ref 70–99)
Potassium: 3.5 mmol/L (ref 3.5–5.1)
Sodium: 135 mmol/L (ref 135–145)

## 2023-08-23 LAB — BLOOD GAS, VENOUS
Bicarbonate: 30.2 mmol/L — ABNORMAL HIGH (ref 20.0–28.0)
O2 Saturation: 16 mmol/L — ABNORMAL HIGH (ref 0.0–2.0)
Patient temperature: 37
Patient temperature: 37 %
pCO2, Ven: 56 mm[Hg] (ref 44–60)
pH, Ven: 7.34 (ref 7.25–7.43)
pO2, Ven: <31 mmol/L — CL (ref 32–45)

## 2023-08-23 MED ORDER — SODIUM CHLORIDE 0.9 % IV SOLN
1.0000 g | Freq: Three times a day (TID) | INTRAVENOUS | Status: DC
  Administered 2023-08-23 – 2023-08-31 (×23): 1 g via INTRAVENOUS
  Filled 2023-08-23 (×25): qty 20

## 2023-08-23 MED ORDER — CLOPIDOGREL BISULFATE 75 MG PO TABS
75.0000 mg | ORAL_TABLET | Freq: Every day | ORAL | Status: DC
Start: 2023-08-24 — End: 2023-09-01
  Administered 2023-08-24 – 2023-09-01 (×9): 75 mg via ORAL
  Filled 2023-08-23 (×9): qty 1

## 2023-08-23 NOTE — Assessment & Plan Note (Signed)
Spoke with Dr. Excell Seltzer podiatry.  He missed an appointment today for toenail clipping.  They will come over the next couple days to try to deal with his toenails.

## 2023-08-23 NOTE — Evaluation (Signed)
Occupational Therapy Evaluation Patient Details Name: [name] MRN: [id number] DOB: 10-03-1950 Today's Date: 08/23/2023   History of Present Illness 73 year old man past medical history of dementia, bipolar disorder, hypertension, COPD, diastolic congestive heart failure, stroke, hypothyroidism, ESBL UTI and history of right hip fracture with local cellulitis in November 2024.  In the ER he was hypotensive and tachycardic with an elevated white count and lactic acid.   Clinical Impression   Mr Hard was seen for OT evaluation this date. Pt is poor historian, per chart pt from group home and uses RW. Pt currently requires CGA static sitting with BUE support, decreases to strong posterior lean with attempts to initiate forward movement. MAX A x2 sit<>stand decreasing to one person for stand pivot bed>chair as pt improved command following in standing. Pt would benefit from skilled OT to address noted impairments and functional limitations (see below for any additional details). Upon hospital discharge, recommend OT follow up.    If plan is discharge home, recommend the following: Two people to help with walking and/or transfers;Two people to help with bathing/dressing/bathroom    Functional Status Assessment  Patient has had a recent decline in their functional status and demonstrates the ability to make significant improvements in function in a reasonable and predictable amount of time.  Equipment Recommendations  BSC/3in1    Recommendations for Other Services       Precautions / Restrictions Precautions Precautions: Fall Restrictions Weight Bearing Restrictions Per Provider Order: No      Mobility Bed Mobility Overal bed mobility: Needs Assistance Bed Mobility: Supine to Sit     Supine to sit: Max assist, +2 for physical assistance          Transfers Overall transfer level: Needs assistance Equipment used: 2 person hand held assist Transfers: Sit to/from Stand, Bed  to chair/wheelchair/BSC Sit to Stand: Max assist, +2 physical assistance Stand pivot transfers: Max assist         General transfer comment: limited by cognition      Balance Overall balance assessment: Needs assistance Sitting-balance support: Bilateral upper extremity supported, Feet supported Sitting balance-Leahy Scale: Poor Sitting balance - Comments: actively pushing backwards with attempts to initiate movement Postural control: Posterior lean Standing balance support: Bilateral upper extremity supported Standing balance-Leahy Scale: Poor                             ADL either performed or assessed with clinical judgement   ADL Overall ADL's : Needs assistance/impaired                                       General ADL Comments: MAX A don B socks in sitting. MAX A x2 simulated SBC t/f      Pertinent Vitals/Pain Pain Assessment Pain Assessment: No/denies pain     Extremity/Trunk Assessment Upper Extremity Assessment Upper Extremity Assessment: Overall WFL for tasks assessed   Lower Extremity Assessment Lower Extremity Assessment: Generalized weakness       Communication Communication Communication: Difficulty following commands/understanding Following commands: Follows one step commands inconsistently Cueing Techniques: Verbal cues;Gestural cues;Visual cues   Cognition Arousal: Alert Behavior During Therapy: Flat affect Overall Cognitive Status: No family/caregiver present to determine baseline cognitive functioning  General Comments: minimally verbally responsive                Home Living Family/patient expects to be discharged to:: Group home Living Arrangements: Group Home   Type of Home: Group Home                                  Prior Functioning/Environment Prior Level of Function : Patient poor historian/Family not available              Mobility Comments: pt endorses he uses a RW          OT Problem List: Decreased strength;Decreased range of motion;Decreased activity tolerance;Impaired balance (sitting and/or standing)      OT Treatment/Interventions: Self-care/ADL training;Therapeutic exercise;Energy conservation;DME and/or AE instruction;Therapeutic activities;Patient/family education    OT Goals(Current goals can be found in the care plan section) Acute Rehab OT Goals Patient Stated Goal: to go home OT Goal Formulation: With patient Time For Goal Achievement: 09/06/23 Potential to Achieve Goals: Good ADL Goals Pt Will Perform Grooming: with min assist;sitting Pt Will Perform Lower Body Dressing: with min assist;sitting/lateral leans Pt Will Transfer to Toilet: with min assist;stand pivot transfer;bedside commode  OT Frequency: Min 1X/week    Co-evaluation PT/OT/SLP Co-Evaluation/Treatment: Yes Reason for Co-Treatment: Necessary to address cognition/behavior during functional activity;For patient/therapist safety PT goals addressed during session: Mobility/safety with mobility OT goals addressed during session: ADL's and self-care      AM-PAC OT "6 Clicks" Daily Activity     Outcome Measure Help from another person eating meals?: None Help from another person taking care of personal grooming?: A Little Help from another person toileting, which includes using toliet, bedpan, or urinal?: A Lot Help from another person bathing (including washing, rinsing, drying)?: A Lot Help from another person to put on and taking off regular upper body clothing?: A Little Help from another person to put on and taking off regular lower body clothing?: A Lot 6 Click Score: 16   End of Session Equipment Utilized During Treatment: Gait belt Nurse Communication: Mobility status  Activity Tolerance: Patient tolerated treatment well Patient left: in chair;with call bell/phone within reach;with chair alarm set  OT Visit  Diagnosis: Unsteadiness on feet (R26.81);Other abnormalities of gait and mobility (R26.89)                Time: 6295-2841 OT Time Calculation (min): 14 min Charges:  OT General Charges $OT Visit: 1 Visit OT Evaluation $OT Eval Moderate Complexity: 1 Mod   Kathie Dike, M.S. OTR/L  08/23/23, 9:49 AM  ascom [phone]

## 2023-08-23 NOTE — Progress Notes (Signed)
Physical Therapy Evaluation Patient Details Name: [name] MRN: [id number] DOB: 1950/08/15 Today's Date: 08/23/2023  History of Present Illness  73 year old man past medical history of dementia, bipolar disorder, hypertension, COPD, diastolic congestive heart failure, stroke, hypothyroidism, ESBL UTI and history of right hip fracture with local cellulitis in November 2024.  In the ER he was hypotensive and tachycardic with an elevated white count and lactic acid.   Clinical Impression  Patient received supine in bed attempting to eat meal. Patient agreeable to PT session in attempts to eat in more upright position. Patient is poor historian. From chart review, patient is from group home and uses RW. Patient require MAX A +2 for bed mobility. Once seated CGA but heavy posterior lean/resistance to mobility attempts. After multiple attempts, able to stand MAX A +2 with HHA, and Stand Pivot Transfer with MAX A. Multimodal cues required, pt with intermittent following of commands. Overall very limited due to cognition throughout session. Patient left in recliner with chair alarm set, set up with meal tray. Patient will benefit from skilled acute PT services to address functional impairments (see below for additional) and maximize functional mobility. Anticipate the need for follow up PT services upon acute hospital discharge. Will continue to follow acutely        If plan is discharge home, recommend the following: Two people to help with walking and/or transfers;Two people to help with bathing/dressing/bathroom;Assist for transportation;Help with stairs or ramp for entrance   Can travel by private vehicle   No    Equipment Recommendations Other (comment) (TBD at next venue of care)  Recommendations for Other Services       Functional Status Assessment Patient has had a recent decline in their functional status and demonstrates the ability to make significant improvements in function in a  reasonable and predictable amount of time.     Precautions / Restrictions Precautions Precautions: Fall Restrictions Weight Bearing Restrictions Per Provider Order: No      Mobility  Bed Mobility Overal bed mobility: Needs Assistance Bed Mobility: Supine to Sit     Supine to sit: Max assist, +2 for physical assistance     General bed mobility comments: Pt require assist at trunk/BLE's. Patient able to sit with feet supported, but posterior lean noted, pt resistive to attemps to scoot forward.    Transfers Overall transfer level: Needs assistance Equipment used: 2 person hand held assist Transfers: Sit to/from Stand, Bed to chair/wheelchair/BSC Sit to Stand: Max assist, +2 physical assistance Stand pivot transfers: Max assist         General transfer comment: Pt extremely limited by command; Patient require Max A +2 to stand, poor standing posture; significant posterior lean. Follow command inconsistently. PT initially blocking LE to keep them from sliding.    Ambulation/Gait               General Gait Details: deferred this date  Stairs            Wheelchair Mobility     Tilt Bed    Modified Rankin (Stroke Patients Only)       Balance Overall balance assessment: Needs assistance Sitting-balance support: Bilateral upper extremity supported, Feet supported Sitting balance-Leahy Scale: Poor Sitting balance - Comments: patient resitive; actively pushing posterior. Postural control: Posterior lean Standing balance support: Bilateral upper extremity supported Standing balance-Leahy Scale: Poor Standing balance comment: Poor standing balance; +2 assist; contineut od emo posterior bias.  Pertinent Vitals/Pain Pain Assessment Pain Assessment: No/denies pain    Home Living Family/patient expects to be discharged to:: Group home Living Arrangements: Group Home   Type of Home: Group Home              Additional Comments: pt is a poor historian; no one at bedside.    Prior Function Prior Level of Function : Patient poor historian/Family not available             Mobility Comments: Patient reports he used RW; unsure of validity. Pt is poor historian.       Extremity/Trunk Assessment   Upper Extremity Assessment Upper Extremity Assessment: Overall WFL for tasks assessed    Lower Extremity Assessment Lower Extremity Assessment: Generalized weakness       Communication   Communication Communication: Difficulty following commands/understanding Following commands: Follows one step commands inconsistently Cueing Techniques: Verbal cues;Gestural cues  Cognition Arousal: Alert Behavior During Therapy: Flat affect Overall Cognitive Status: No family/caregiver present to determine baseline cognitive functioning                                          General Comments      Exercises     Assessment/Plan    PT Assessment Patient needs continued PT services  PT Problem List Decreased strength;Decreased activity tolerance;Decreased balance;Decreased mobility;Decreased cognition;Decreased knowledge of use of DME       PT Treatment Interventions DME instruction;Gait training;Stair training;Functional mobility training;Therapeutic exercise;Therapeutic activities;Balance training;Neuromuscular re-education    PT Goals (Current goals can be found in the Care Plan section)  Acute Rehab PT Goals PT Goal Formulation: Patient unable to participate in goal setting Time For Goal Achievement: 09/06/23 Potential to Achieve Goals: Fair    Frequency Min 1X/week     Co-evaluation PT/OT/SLP Co-Evaluation/Treatment: Yes Reason for Co-Treatment: Necessary to address cognition/behavior during functional activity;For patient/therapist safety PT goals addressed during session: Mobility/safety with mobility OT goals addressed during session: ADL's and self-care        AM-PAC PT "6 Clicks" Mobility  Outcome Measure Help needed turning from your back to your side while in a flat bed without using bedrails?: A Lot Help needed moving from lying on your back to sitting on the side of a flat bed without using bedrails?: A Lot Help needed moving to and from a bed to a chair (including a wheelchair)?: A Lot Help needed standing up from a chair using your arms (e.g., wheelchair or bedside chair)?: A Lot Help needed to walk in hospital room?: Total Help needed climbing 3-5 steps with a railing? : Total 6 Click Score: 10    End of Session Equipment Utilized During Treatment: Gait belt Activity Tolerance: Patient tolerated treatment well Patient left: in chair;with call bell/phone within reach;with chair alarm set Nurse Communication: Mobility status PT Visit Diagnosis: Difficulty in walking, not elsewhere classified (R26.2);Other abnormalities of gait and mobility (R26.89);Muscle weakness (generalized) (M62.81);Unsteadiness on feet (R26.81)    Time: 1610-9604 PT Time Calculation (min) (ACUTE ONLY): 14 min   Charges:   PT Evaluation $PT Eval Low Complexity: 1 Low   PT General Charges $$ ACUTE PT VISIT: 1 Visit         Creed Copper Fairly, PT, DPT 08/23/23 10:50 AM

## 2023-08-23 NOTE — Care Management Important Message (Signed)
Important Message  Patient Details  Name: [name] MRN: [id number] Date of Birth: 02-28-1951   Important Message Given:  Yes - Medicare IM     Shima Compere W, CMA 08/23/2023, 10:09 AM

## 2023-08-23 NOTE — Consult Note (Signed)
Pharmacy Antibiotic Note  [name] is a 73 y.o. male admitted on 08/20/2023 with  lumbar discitis/osteo .  Pharmacy has been consulted for meropenem dosing. Afeb, WBC WNL, Last year he had ESBL E. coli bacteremia, AMS, recent righ hip fracture with ORIF, L1 and L2 discitis and osteomyelitis.  Status post aspiration of the disc and cultures pending Gram-negative rods seen in the Gram stain.   Plan: Will start Meropenem 1 g q8H   Will continue to vancomycin 1750 mg q24H for a predicted AUC of 527. Goal AUC 400-600. Plan to obtain vancomycin level after 4th or 5th dose. Scr 0.8, Vd 0.72, IBW.   Height: 5\' 6"  (167.6 cm) Weight: 68.9 kg (152 lb) IBW/kg (Calculated) : 63.8  Temp (24hrs), Avg:98 F (36.7 C), Min:97.8 F (36.6 C), Max:98.3 F (36.8 C)  Recent Labs  Lab 08/20/23 1542 08/20/23 1713 08/20/23 1847 08/20/23 2100 08/21/23 0640 08/22/23 0616 08/23/23 0529  WBC QUESTIONABLE IDENTIFICATION / INCORRECTLY LABELED SPECIMEN  --  11.3*  --  6.6 6.6 5.3  CREATININE QUESTIONABLE IDENTIFICATION / INCORRECTLY LABELED SPECIMEN  --   --  0.75 0.59* 0.57* 0.57*  LATICACIDVEN QUESTIONABLE IDENTIFICATION / INCORRECTLY LABELED SPECIMEN QUESTIONABLE IDENTIFICATION / INCORRECTLY LABELED SPECIMEN 2.1* 1.3  --   --   --     Estimated Creatinine Clearance: 75.3 mL/min (A) (by C-G formula based on SCr of 0.57 mg/dL (L)).    Allergies  Allergen Reactions   Asa [Aspirin]     Hx GI bleed    Antimicrobials this admission: 1/31 cefepime >> 2/3 1/31 vancomycin >>   Dose adjustments this admission: None  Microbiology results: 1/31 BCx: pending 1/31 UCx: <10,000 COLONIES/mL INSIGNIFICANT GROWTH   2/2 Body fluid cx:   RARE GRAM NEGATIVE RODS     Thank you for allowing pharmacy to be a part of this patient's care.  [name], PharmD, BCPS 08/23/2023 8:31 PM

## 2023-08-23 NOTE — Consult Note (Signed)
NAMEPedro Reid  DOB: 03/22/1951  MRN: [id number]  Date/Time: 08/23/2023 2:01 PM  REQUESTING PROVIDER: Dr.Wieting Subjective:  REASON FOR CONSULT: lumbar discitis/osteo ?No history from patient.  Chart reviewed in detail [name] is a 73 y.o. with a history of Schizophrenia,HTN, HLD, COPD, bipolar disorder , ESBL ecoli bacteremia in Aug 2024 and complex rt renal lesion and was treated with meropenem for 2 weeks, readmitted 05/06/23-06/04/23 for communited rt hip fracture, rt hip celluliits , surgery for hip postponed due to cellulitis and after 11 days of IV antibiotic he underwent ORIF on 05/18/23 was sent to Rehab on 06/04/23  presents from group home with altered mental status and smelling of urine In the ED vitals were  08/20/23 15:46  BP 84/53 (L)  Temp 97.8 F (36.6 C)  Pulse Rate 115 !  Resp 18  SpO2 95 %    Latest Reference Range & Units 08/20/23 18:47  WBC 4.0 - 10.5 K/uL 11.3 (H) [1]  Hemoglobin 13.0 - 17.0 g/dL 29.5 (L)  HCT 28.4 - 13.2 % 39.6  Platelets 150 - 400 K/uL 123 (L) [2]  BC sent Pt had CT chest/abd/pelvis CT abdomen pelvis showed irregular endplate at the disc at L1-L2 both endplates being involved.  Concerning for discitis and osteomyelitis.  The right kidney showed a 2.4 into 2.4 cm complex lesion along the lower pole. There was 3.2 into 2.9 cm infra abdominal aorta dilatation.  Also noted additionally there are common iliac artery dilatations. Patient was started on broad-spectrum antibiotic He underwent an MRI of the spine and that showed findings consistent with discitis osteomyelitis at L1-L2 with a small abscess in the right psoas muscle.  Patient underwent CT guided aspiration of the L1-L2 disc space as well as the right psoas muscle and it was sent for culture.  I been asked to see the patient for management of the infection.  Past Medical History:  Diagnosis Date   Bipolar disorder (HCC)    COPD (chronic obstructive pulmonary disease) (HCC)     Depression    GERD (gastroesophageal reflux disease)    GIB (gastrointestinal bleeding)    Hyperlipidemia    Hypertension    Mild dementia (HCC)    Paranoid schizophrenia (HCC)    Smoker    Tobacco dependence     Past Surgical History:  Procedure Laterality Date   INTRAMEDULLARY (IM) NAIL INTERTROCHANTERIC Right 05/18/2023   Procedure: INTRAMEDULLARY (IM) NAIL INTERTROCHANTERIC;  Surgeon: Deeann Saint, MD;  Location: [facility] ORS;  Service: Orthopedics;  Laterality: Right;    Social History   Socioeconomic History   Marital status: Single    Spouse name: Not on file   Number of children: Not on file   Years of education: Not on file   Highest education level: Not on file  Occupational History   Not on file  Tobacco Use   Smoking status: Every Day    Current packs/day: 1.00    Types: Cigarettes   Smokeless tobacco: Never  Vaping Use   Vaping status: Never Used  Substance and Sexual Activity   Alcohol use: No    Alcohol/week: 0.0 standard drinks of alcohol   Drug use: No   Sexual activity: Never  Other Topics Concern   Not on file  Social History Narrative   Not on file   Social Drivers of Health   Financial Resource Strain: Not on file  Food Insecurity: Patient Unable To Answer (08/23/2023)   Hunger Vital Sign    Worried About  Running Out of Food in the Last Year: Patient unable to answer    Ran Out of Food in the Last Year: Patient unable to answer  Transportation Needs: Patient Unable To Answer (08/23/2023)   PRAPARE - Transportation    Lack of Transportation (Medical): Patient unable to answer    Lack of Transportation (Non-Medical): Patient unable to answer  Physical Activity: Not on file  Stress: Not on file  Social Connections: Patient Unable To Answer (08/23/2023)   Social Connection and Isolation Panel [NHANES]    Frequency of Communication with Friends and Family: Patient unable to answer    Frequency of Social Gatherings with Friends and Family: Patient  unable to answer    Attends Religious Services: Patient unable to answer    Active Member of Clubs or Organizations: Patient unable to answer    Attends Banker Meetings: Patient unable to answer    Marital Status: Patient unable to answer  Intimate Partner Violence: Patient Unable To Answer (08/23/2023)   Humiliation, Afraid, Rape, and Kick questionnaire    Fear of Current or Ex-Partner: Patient unable to answer    Emotionally Abused: Patient unable to answer    Physically Abused: Patient unable to answer    Sexually Abused: Patient unable to answer    Family History  Family history unknown: Yes   Allergies  Allergen Reactions   Asa [Aspirin]     Hx GI bleed   I? Current Facility-Administered Medications  Medication Dose Route Frequency Provider Last Rate Last Admin   acetaminophen (TYLENOL) tablet 650 mg  650 mg Oral Q6H PRN Andris Baumann, MD       Or   acetaminophen (TYLENOL) suppository 650 mg  650 mg Rectal Q6H PRN Andris Baumann, MD       albuterol (PROVENTIL) (2.5 MG/3ML) 0.083% nebulizer solution 2.5 mg  2.5 mg Nebulization Q2H PRN Andris Baumann, MD       amLODipine (NORVASC) tablet 5 mg  5 mg Oral Daily Wieting, Richard, MD   5 mg at 08/23/23 1137   atorvastatin (LIPITOR) tablet 40 mg  40 mg Oral QPM Alford Highland, MD   40 mg at 08/22/23 1823   ceFEPIme (MAXIPIME) 2 g in sodium chloride 0.9 % 100 mL IVPB  2 g Intravenous Q8H Lindajo Royal V, MD 200 mL/hr at 08/23/23 1132 2 g at 08/23/23 1132   citalopram (CELEXA) tablet 40 mg  40 mg Oral Daily Alford Highland, MD   40 mg at 08/23/23 1137   [START ON 08/24/2023] clopidogrel (PLAVIX) tablet 75 mg  75 mg Oral Daily Wieting, Richard, MD       ferrous sulfate tablet 325 mg  325 mg Oral Q breakfast Lindajo Royal V, MD   325 mg at 08/23/23 1137   guaiFENesin (MUCINEX) 12 hr tablet 600 mg  600 mg Oral BID Lindajo Royal V, MD   600 mg at 08/22/23 2145   haloperidol lactate (HALDOL) injection 1 mg  1 mg Intravenous  Q6H PRN Alford Highland, MD   1 mg at 08/22/23 1349   HYDROcodone-acetaminophen (NORCO/VICODIN) 5-325 MG per tablet 1-2 tablet  1-2 tablet Oral Q6H PRN Andris Baumann, MD   2 tablet at 08/22/23 1710   methimazole (TAPAZOLE) tablet 5 mg  5 mg Oral TID Andris Baumann, MD   5 mg at 08/23/23 1137   ondansetron (ZOFRAN) tablet 4 mg  4 mg Oral Q6H PRN Andris Baumann, MD  Or   ondansetron (ZOFRAN) injection 4 mg  4 mg Intravenous Q6H PRN Andris Baumann, MD       risperiDONE (RISPERDAL) tablet 1 mg  1 mg Oral QHS Alford Highland, MD   1 mg at 08/22/23 2145   tamsulosin (FLOMAX) capsule 0.4 mg  0.4 mg Oral QPC supper Alford Highland, MD   0.4 mg at 08/22/23 1823   vancomycin (VANCOREADY) IVPB 1750 mg/350 mL  1,750 mg Intravenous Q24H [name], [facility]   Stopped at 08/22/23 2346     Abtx:  Anti-infectives (From admission, onward)    Start     Dose/Rate Route Frequency Ordered Stop   08/22/23 1800  vancomycin (VANCOREADY) IVPB 1750 mg/350 mL        1,750 mg 175 mL/hr over 120 Minutes Intravenous Every 24 hours 08/22/23 1006     08/21/23 0700  vancomycin (VANCOCIN) IVPB 1000 mg/200 mL premix  Status:  Discontinued        1,000 mg 200 mL/hr over 60 Minutes Intravenous Every 12 hours 08/21/23 0400 08/22/23 1006   08/21/23 0415  vancomycin (VANCOCIN) 750 mg in sodium chloride 0.9 % 250 mL IVPB  Status:  Discontinued        750 mg 250 mL/hr over 60 Minutes Intravenous  Once 08/21/23 0402 08/21/23 0642   08/21/23 0400  vancomycin (VANCOREADY) IVPB 750 mg/150 mL  Status:  Discontinued        750 mg 150 mL/hr over 60 Minutes Intravenous  Once 08/21/23 0352 08/21/23 0800   08/21/23 0345  ceFEPIme (MAXIPIME) 2 g in sodium chloride 0.9 % 100 mL IVPB        2 g 200 mL/hr over 30 Minutes Intravenous Every 8 hours 08/21/23 0340     08/20/23 1645  ceFEPIme (MAXIPIME) 2 g in sodium chloride 0.9 % 100 mL IVPB        2 g 200 mL/hr over 30 Minutes Intravenous  Once 08/20/23 1641 08/20/23 1844    08/20/23 1645  vancomycin (VANCOCIN) IVPB 1000 mg/200 mL premix        1,000 mg 200 mL/hr over 60 Minutes Intravenous  Once 08/20/23 1641 08/20/23 2157       REVIEW OF SYSTEMS:  No history available from patient    VITALS:  BP 132/82 (BP Location: Right Arm)   Pulse 81   Temp 97.8 F (36.6 C)   Resp 19   Ht 5\' 6"  (1.676 m)   Wt 68.9 kg   SpO2 100%   BMI 24.53 kg/m   PHYSICAL EXAM:  General: Awake, oriented in person Follows some commands but not able to respond to any questions appropriately. Disheveled Head: Normocephalic, without obvious abnormality, atraumatic. Eyes: Conjunctivae clear, anicteric sclerae. Pupils are equal ENT Nares normal. No drainage or sinus tenderness. Lips, mucosa, and tongue normal. No Thrush Poor dentition.  Multiple missing teeth Loose teeth Neck:  symmetrical, no adenopathy, thyroid: non tender no carotid bruit and no JVD. Back: Hyperpigmentation over the lower back Lungs:  bilateral air entry.  Crepitations right base Heart: S1-S2. Abdomen: Soft, non-tender,not distended. Bowel sounds normal. No masses Extremities: Right hip surgical site is healed well.  There is no erythema or active cellulitis. Skin: No rashes or lesions. Or bruising Toenails onychogryphosis Lymph: Cervical, supraclavicular normal. Neurologic: Moves all extremities  Pertinent Labs Lab Results CBC    Component Value Date/Time   WBC 5.3 08/23/2023 0529   RBC 4.15 (L) 08/23/2023 0529   HGB 11.4 (L) 08/23/2023 0529  HGB 14.4 01/14/2016 1155   HCT 35.3 (L) 08/23/2023 0529   HCT 43.5 01/14/2016 1155   PLT 126 (L) 08/23/2023 0529   PLT 113 (L) 01/14/2016 1155   MCV 85.1 08/23/2023 0529   MCV 93 01/14/2016 1155   MCV 88 01/09/2013 0432   MCH 27.5 08/23/2023 0529   MCHC 32.3 08/23/2023 0529   RDW 15.8 (H) 08/23/2023 0529   RDW 14.5 01/14/2016 1155   RDW 14.8 (H) 01/09/2013 0432   LYMPHSABS 0.6 (L) 08/20/2023 1847   LYMPHSABS 1.2 01/09/2013 0432   MONOABS 0.2  08/20/2023 1847   MONOABS 0.8 01/09/2013 0432   EOSABS 0.0 08/20/2023 1847   EOSABS 0.2 01/09/2013 0432   BASOSABS 0.0 08/20/2023 1847   BASOSABS 0.0 01/09/2013 0432       Latest Ref Rng & Units 08/23/2023    5:29 AM 08/22/2023    6:16 AM 08/21/2023    6:40 AM  CMP  Glucose 70 - 99 mg/dL 79  79  76   BUN 8 - 23 mg/dL 8  10  16    Creatinine 0.61 - 1.24 mg/dL 1.30  8.65  7.84   Sodium 135 - 145 mmol/L 135  135  138   Potassium 3.5 - 5.1 mmol/L 3.5  3.5  3.7   Chloride 98 - 111 mmol/L 100  102  104   CO2 22 - 32 mmol/L 24  22  25    Calcium 8.9 - 10.3 mg/dL 9.2  9.1  9.2       Microbiology: Recent Results (from the past 240 hours)  Resp panel by RT-PCR (RSV, Flu A&B, Covid) Anterior Nasal Swab     Status: None   Collection Time: 08/20/23  5:19 PM   Specimen: Anterior Nasal Swab  Result Value Ref Range Status   SARS Coronavirus 2 by RT PCR NEGATIVE NEGATIVE Final    Comment: (NOTE) SARS-CoV-2 target nucleic acids are NOT DETECTED.  The SARS-CoV-2 RNA is generally detectable in upper respiratory specimens during the acute phase of infection. The lowest concentration of SARS-CoV-2 viral copies this assay can detect is 138 copies/mL. A negative result does not preclude SARS-Cov-2 infection and should not be used as the sole basis for treatment or other patient management decisions. A negative result may occur with  improper specimen collection/handling, submission of specimen other than nasopharyngeal swab, presence of viral mutation(s) within the areas targeted by this assay, and inadequate number of viral copies(<138 copies/mL). A negative result must be combined with clinical observations, patient history, and epidemiological information. The expected result is Negative.  Fact Sheet for Patients:  BloggerCourse.com  Fact Sheet for Healthcare Providers:  SeriousBroker.it  This test is no t yet approved or cleared by the  [place] FDA and  has been authorized for detection and/or diagnosis of SARS-CoV-2 by FDA under an Emergency Use Authorization (EUA). This EUA will remain  in effect (meaning this test can be used) for the duration of the COVID-19 declaration under Section 564(b)(1) of the Act, 21 U.S.C.section 360bbb-3(b)(1), unless the authorization is terminated  or revoked sooner.       Influenza A by PCR NEGATIVE NEGATIVE Final   Influenza B by PCR NEGATIVE NEGATIVE Final    Comment: (NOTE) The Xpert Xpress SARS-CoV-2/FLU/RSV plus assay is intended as an aid in the diagnosis of influenza from Nasopharyngeal swab specimens and should not be used as a sole basis for treatment. Nasal washings and aspirates are unacceptable for Xpert Xpress SARS-CoV-2/FLU/RSV testing.  Fact  Sheet for Patients: BloggerCourse.com  Fact Sheet for Healthcare Providers: SeriousBroker.it  This test is not yet approved or cleared by the [place] FDA and has been authorized for detection and/or diagnosis of SARS-CoV-2 by FDA under an Emergency Use Authorization (EUA). This EUA will remain in effect (meaning this test can be used) for the duration of the COVID-19 declaration under Section 564(b)(1) of the Act, 21 U.S.C. section 360bbb-3(b)(1), unless the authorization is terminated or revoked.     Resp Syncytial Virus by PCR NEGATIVE NEGATIVE Final    Comment: (NOTE) Fact Sheet for Patients: BloggerCourse.com  Fact Sheet for Healthcare Providers: SeriousBroker.it  This test is not yet approved or cleared by the [place] FDA and has been authorized for detection and/or diagnosis of SARS-CoV-2 by FDA under an Emergency Use Authorization (EUA). This EUA will remain in effect (meaning this test can be used) for the duration of the COVID-19 declaration under Section 564(b)(1) of the Act, 21 U.S.C. section  360bbb-3(b)(1), unless the authorization is terminated or revoked.  Performed at [facility], [address] [place], [place] [address]   Blood culture (routine x 2)     Status: None (Preliminary result)   Collection Time: 08/20/23  6:47 PM   Specimen: BLOOD RIGHT ARM  Result Value Ref Range Status   Specimen Description BLOOD RIGHT ARM  Final   Special Requests   Final    AEROBIC BOTTLE ONLY Blood Culture results may not be optimal due to an inadequate volume of blood received in culture bottles   Culture   Final    NO GROWTH 3 DAYS Performed at [facility], [address], [place], [place] [address]    Report Status PENDING  Incomplete  Blood culture (routine x 2)     Status: None (Preliminary result)   Collection Time: 08/20/23  6:47 PM   Specimen: BLOOD RIGHT HAND  Result Value Ref Range Status   Specimen Description BLOOD RIGHT HAND  Final   Special Requests   Final    AEROBIC BOTTLE ONLY Blood Culture results may not be optimal due to an inadequate volume of blood received in culture bottles   Culture   Final    NO GROWTH 3 DAYS Performed at [facility], [address], [place], [place] [address]    Report Status PENDING  Incomplete  Urine Culture     Status: Abnormal   Collection Time: 08/20/23  9:19 PM   Specimen: Urine, Random  Result Value Ref Range Status   Specimen Description   Final    URINE, RANDOM Performed at [facility], [address] [place], [place] [address]    Special Requests   Final    NONE Reflexed from [id number] Performed at [facility], [address] [place], [place] [address]    Culture (A)  Final    <10,000 COLONIES/mL INSIGNIFICANT GROWTH Performed at [facility], [address], [place], [place] [address]    Report Status 08/21/2023 FINAL  Final  Aerobic/Anaerobic Culture w Gram Stain (surgical/deep wound)     Status: None (Preliminary result)   Collection Time: 08/22/23   9:00 AM   Specimen: Body Fluid  Result Value Ref Range Status   Specimen Description   Final    FLUID Performed at [facility], [address], [place], [place] [address]    Special Requests   Final    NONE Performed at [facility], [address]  [place], [place] [address]    Gram Stain   Final    FEW WBC PRESENT, PREDOMINANTLY PMN NO ORGANISMS SEEN    Culture   Final    RARE GRAM NEGATIVE RODS CULTURE REINCUBATED FOR BETTER GROWTH Performed at [facility], [address] [place], [place] [address]    Report Status PENDING  Incomplete   CT abdomen/pelvis   CT abdomen pelvis showed irregular endplate at the disc at L1-L2 both endplates being involved.  Concerning for discitis and osteomyelitis.  The right kidney showed a 2.4 into 2.4 cm complex lesion along the lower pole. There was 3.2 into 2.9 cm infra abdominal aorta dilatation.  Also noted additionally there are common iliac artery dilatations.IMAGING RESULTS:  I have personally reviewed the films L1-L2 discitis and osteomyelitis ? Impression/Recommendation 73 year old male coming from group home with altered mental status He has a history of schizophrenia, bipolar disorder, COPD and recent right hip fracture with ORIF. Last year he had ESBL E. coli bacteremia  L1 and L2 discitis and osteomyelitis.  Status post aspiration of the disc and cultures pending Gram-negative rods seen in the Gram stain Patient is currently on cefepime and vancomycin Will change cefepime to meropenem because of the history of ESBL E. coli  Right hip surgical site is healed well  Schizophrenia.  On risperidone  Hyperthyroidism on methimazole  Hypertension on amlodipine  Hyperlipidemia on atorvastatin  COPD on nebulizers as needed  Right renal lower pole cystic lesion  Inferior abdominal aorta aneurysmal dilatation ? _I have personally spent  --75 -minutes involved in face-to-face and non-face-to-face  activities for this patient on the day of the visit. Professional time spent includes the following activities: Preparing to see the patient (review of tests), Obtaining and/or reviewing separately obtained history (admission/discharge record), Performing a medically appropriate examination and/or evaluation , Ordering medications/tests/procedures, referring and communicating with other health care professionals, Documenting clinical information in the EMR, Independently interpreting results (not separately reported), Communicating results to the patient/family/caregiver, Counseling and educating the patient/family/caregiver and Care coordination (not separately reported).    This involved complex antimicrobial therapy counseling and treatment  ________________________________________________ Discussed with patient, requesting provider Note:  This document was prepared using Dragon voice recognition software and may include unintentional dictation errors.

## 2023-08-23 NOTE — Progress Notes (Signed)
Progress Note   Patient: [name] WUJ:[id number] DOB: 1951/04/16 DOA: 08/20/2023     3 DOS: the patient was seen and examined on 08/23/2023   Brief hospital course: 73 year old man past medical history of dementia, bipolar disorder, hypertension, COPD, diastolic congestive heart failure, stroke, hypothyroidism, ESBL UTI and history of right hip fracture with local cellulitis in November 2024.  In the ER he was hypotensive and tachycardic with an elevated white count and lactic acid.  CT scan showed cystic lesion right kidney, discitis possible osteomyelitis L1-L2, significant stenosis iliac vessels with occlusion of the right SFA and 3.2 cm abdominal aortic aneurysm  MRI showed discitis osteomyelitis L1-L2 with abscess in the right psoas muscle  2/1.  Case discussed with interventional radiology to evaluate for drainage of abscess. 2/2.  Patient had a drainage procedure today.  Patient feels okay. 2/3.  So far culture does not show any organisms but reintubated for better growth.  Assessment and Plan: * Severe sepsis (HCC) Severe sepsis criteria include hypotension, tachycardia, leukocytosis, acute metabolic encephalopathy, lactic acidosis discitis and osteomyelitis Patient on cefepime and vancomycin.  Follow-up cultures. Interventional radiology did a culture of discitis on 08/22/23.  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 Will get ID consultation.  Psoas abscess [facility]) Interventional radiology unable to get culture of this.  Aspiration pneumonia (HCC) Continue cefepime and vancomycin.  Patient on dysphagia 2 diet.   Chronic diastolic CHF (congestive heart failure) (HCC) Clinically euvolemic to dry   Acute metabolic encephalopathy Mental status  improved and able to answer questions.  Hypertension Continue Norvasc.  Thrombocytopenia (HCC) Secondary to sepsis.  Platelet count 126.  Hyperthyroidism Continue methimazole  AAA (abdominal aortic aneurysm) (HCC) Aortic atherosclerosis Lipitor  PVD (peripheral vascular disease) (HCC) Patient also has AAA.  Will need vascular follow-up as outpatient.  Placed on Lipitor.  Aspirin allergy listed in the computer.  Will start Plavix tomorrow  Bipolar disorder (HCC) Schizophrenia Dementia without behavioral disturbance Continue Risperdal and Celexa  COPD (chronic obstructive pulmonary disease) (HCC) Not acutely exacerbated DuoNebs as needed  Urinary tract infection Ruled out.  Urine culture negative  Dementia without behavioral disturbance (HCC) Continue Risperdal at night and Celexa during the day  Fungal infection of toenail Spoke with Dr. Excell Seltzer podiatry.  He missed an appointment today for toenail clipping.  They will come over the next couple days to try to deal with his toenails.       Subjective: Patient seen earlier this morning and was still very sleepy.  Answered a few questions but went back to sleep.  Admitted with sepsis and found to have discitis/osteomyelitis.  Physical Exam: Vitals:   08/22/23 2340 08/23/23 0224 08/23/23 0428 08/23/23 0818  BP: (!) 150/75 (!) 173/84 (!) 156/71 132/82  Pulse: 81 75 76 81  Resp: 19 19 18 19   Temp: 98 F (36.7 C) 97.8 F (36.6 C) 98.2 F (36.8 C) 97.8 F (36.6 C)  TempSrc: Oral     SpO2: 99% 100% 99% 100%  Weight:      Height:       Physical Exam HENT:     Head: Normocephalic.     Mouth/Throat:     Pharynx: No oropharyngeal exudate.  Eyes:     General: Lids are normal.     Conjunctiva/sclera: Conjunctivae normal.  Cardiovascular:     Rate and Rhythm: Normal rate and regular rhythm.     Heart sounds: Normal heart sounds, S1 normal and S2 normal.  Pulmonary:     Breath sounds: No decreased breath sounds,  wheezing, rhonchi or rales.  Abdominal:     Palpations: Abdomen is soft.     Tenderness: There is no abdominal tenderness.  Musculoskeletal:     Right lower leg: No swelling.     Left lower leg: No swelling.  Skin:    General: Skin is warm.     Comments: Fungus toenails wrapping underneath the toes.  Neurological:     Mental Status: He is alert.     Comments: Patient able to answer questions and went back to sleep.     Data Reviewed: Creatinine 0.57, white blood cell count 5.3, hemoglobin 11.4, platelet count 126  Family Communication:   Disposition: Status is: Inpatient Remains inpatient appropriate because: Will get ID consultation for help with antibiotics.  Treating for discitis/osteomyelitis.  Planned Discharge Destination: To be determined.  From a group home.    Time spent: 28 minutes  Author: Alford Highland, MD 08/23/2023 12:49 PM  For on call review [email or web address].

## 2023-08-24 DIAGNOSIS — M4646 Discitis, unspecified, lumbar region: Secondary | ICD-10-CM | POA: Diagnosis not present

## 2023-08-24 DIAGNOSIS — A498 Other bacterial infections of unspecified site: Secondary | ICD-10-CM

## 2023-08-24 DIAGNOSIS — I959 Hypotension, unspecified: Secondary | ICD-10-CM | POA: Diagnosis not present

## 2023-08-24 DIAGNOSIS — Z1612 Extended spectrum beta lactamase (ESBL) resistance: Secondary | ICD-10-CM | POA: Diagnosis not present

## 2023-08-24 DIAGNOSIS — K6812 Psoas muscle abscess: Secondary | ICD-10-CM | POA: Diagnosis not present

## 2023-08-24 DIAGNOSIS — A419 Sepsis, unspecified organism: Secondary | ICD-10-CM | POA: Diagnosis not present

## 2023-08-24 LAB — CREATININE, SERUM
Creatinine, Ser: 0.54 mg/dL — ABNORMAL LOW (ref 0.61–1.24)
GFR, Estimated: >60 mL/min (ref >60)

## 2023-08-24 MED ORDER — AMMONIUM LACTATE 12 % EX LOTN
TOPICAL_LOTION | Freq: Two times a day (BID) | CUTANEOUS | Status: DC
  Administered 2023-08-24: 1 via TOPICAL
  Filled 2023-08-24: qty 400

## 2023-08-24 NOTE — Progress Notes (Signed)
 Progress Note   Patient: [name] FMW:[id number] DOB: [birth year] DOA: 08/20/2023     4 DOS: the patient was seen and examined on 08/24/2023   Brief hospital course: 73 year old man past medical history of dementia, bipolar disorder, hypertension, COPD, diastolic congestive heart failure, stroke, hypothyroidism, ESBL UTI and history of right hip fracture with local cellulitis in November 2024.  In the ER he was hypotensive and tachycardic with an elevated white count and lactic acid .  CT scan showed cystic lesion right kidney, discitis possible osteomyelitis L1-L2, significant stenosis iliac vessels with occlusion of the right SFA and 3.2 cm abdominal aortic aneurysm  MRI showed discitis osteomyelitis L1-L2 with abscess in the right psoas muscle  2/1.  Case discussed with interventional radiology to evaluate for drainage of abscess. 2/2.  Patient had a drainage procedure today.  Patient feels okay. 2/3.  So far culture does not show any organisms but reintubated for better growth.  ID changed antibiotic over to meropenem . 2/4.  ESBL E. coli growing out of discitis culture.  Continue meropenem .  Follow-up sensitivities.   Assessment and Plan: * Severe sepsis (HCC) Severe sepsis criteria include hypotension, tachycardia, leukocytosis, acute metabolic encephalopathy, lactic acidosis, discitis and osteomyelitis Patient on meropenem  and vancomycin .  Appreciate ID consultation.  Interventional radiology did a culture of discitis on 08/22/23.  ESBL E. coli growing out of discitis culture.  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 Interventional radiology obtained a culture of discitis on 08/22/2023. ESBL E. coli growing out of discitis culture.  Psoas abscess [facility] Interventional radiology unable to get culture of this.  Aspiration  pneumonia (HCC) Continue meropenem  and vancomycin .  Patient on dysphagia 2 diet.   Chronic diastolic CHF (congestive heart failure) (HCC) Clinically euvolemic to dry   Acute metabolic encephalopathy Mental status improved and able to answer questions.  Hypertension Continue Norvasc .  Thrombocytopenia (HCC) Secondary to sepsis.  Platelet count 126.  Hyperthyroidism Continue methimazole   AAA (abdominal aortic aneurysm) (HCC) Aortic atherosclerosis Lipitor  PVD (peripheral vascular disease) (HCC) Patient also has AAA.  Will need vascular follow-up as outpatient.  Placed on Lipitor and Plavix .  Aspirin allergy listed in the computer.   Bipolar disorder (HCC) Schizophrenia Dementia without behavioral disturbance Continue Risperdal  and Celexa   COPD (chronic obstructive pulmonary disease) (HCC) Not acutely exacerbated DuoNebs as needed  Urinary tract infection Ruled out.  Urine culture negative  Dementia without behavioral disturbance (HCC) Continue Risperdal  at night and Celexa  during the day  Fungal infection of toenail Spoke with Dr. Lennie podiatry yesterday.  Patient missed an appointment yesterday for toenail clipping since he was in the hospital.  They will come over the next couple days to try to deal with his toenails.       Subjective: Patient feels okay.  Offers no complaints.  No back pain.  Admitted with altered mental status.  ESBL E. coli growing out of discitis culture.  Physical Exam: Vitals:   08/23/23 1540 08/23/23 2013 08/24/23 0327 08/24/23 0839  BP: (!) 155/74 135/69 (!) 155/74 122/88  Pulse: 80 92 74 81  Resp: 17 18 16 16   Temp: 98 F (36.7 C) 98.3 F (36.8 C) 97.8 F (36.6 C)   TempSrc: Oral     SpO2: 100% 100% 99% 96%  Weight:      Height:       Physical Exam  HENT:     Head: Normocephalic.     Mouth/Throat:     Pharynx: No oropharyngeal exudate.  Eyes:     General: Lids are normal.     Conjunctiva/sclera: Conjunctivae normal.   Cardiovascular:     Rate and Rhythm: Normal rate and regular rhythm.     Heart sounds: Normal heart sounds, S1 normal and S2 normal.  Pulmonary:     Breath sounds: No decreased breath sounds, wheezing, rhonchi or rales.  Abdominal:     Palpations: Abdomen is soft.     Tenderness: There is no abdominal tenderness.  Musculoskeletal:     Right lower leg: No swelling.     Left lower leg: No swelling.  Skin:    General: Skin is warm.     Comments: Fungus toenails wrapping underneath the toes.  Neurological:     Mental Status: He is alert.     Comments: Patient able to answer questions and went back to sleep.     Data Reviewed: Creatinine 0.54  Family Communication: Left message for Timmy  Disposition: Status is: Inpatient Remains inpatient appropriate because: Will need final sensitivities on discitis culture.  Will likely need long-term IV antibiotics.  Planned Discharge Destination: Rehab    Time spent: 28 minutes  Author: Charlie Patterson, MD 08/24/2023 10:48 AM  For on call review [email or web address].

## 2023-08-24 NOTE — Progress Notes (Signed)
 Subjective/Chief Complaint: Patient seen for severely elongated and thick painful toenails on all of his toes.  Was scheduled to be seen outpatient yesterday but missed his appointment due to being hospitalized.   Objective: Vital signs in last 24 hours: Temp:  [97.8 F (36.6 C)-98.3 F (36.8 C)] 97.8 F (36.6 C) (02/04 0327) Pulse Rate:  [74-92] 81 (02/04 0839) Resp:  [16-18] 16 (02/04 0839) BP: (122-155)/(69-88) 122/88 (02/04 0839) SpO2:  [96 %-100 %] 96 % (02/04 0839) Last BM Date : 08/22/23  Intake/Output from previous day: No intake/output data recorded. Intake/Output this shift: No intake/output data recorded.  Neurovascular status is grossly intact bilateral.  All 10 toenails are severely elongated, thick, dystrophic, discolored, brittle with subungual debris.  Multiple nails are ingrown into the distal tuft of the toes.  No signs of any ulceration.  Lab Results:  Recent Labs    08/22/23 0616 08/23/23 0529  WBC 6.6 5.3  HGB 11.0* 11.4*  HCT 35.0* 35.3*  PLT 124* 126*   BMET Recent Labs    08/22/23 0616 08/23/23 0529 08/24/23 0543  NA 135 135  --   K 3.5 3.5  --   CL 102 100  --   CO2 22 24  --   GLUCOSE 79 79  --   BUN 10 8  --   CREATININE 0.57* 0.57* 0.54*  CALCIUM  9.1 9.2  --    PT/INR No results for input(s): LABPROT, INR in the last 72 hours. ABG No results for input(s): PHART, HCO3 in the last 72 hours.  Invalid input(s): PCO2, PO2  Studies/Results: No results found.  Anti-infectives: Anti-infectives (From admission, onward)    Start     Dose/Rate Route Frequency Ordered Stop   08/23/23 2130  meropenem  (MERREM ) 1 g in sodium chloride  0.9 % 100 mL IVPB        1 g 200 mL/hr over 30 Minutes Intravenous Every 8 hours 08/23/23 2037     08/22/23 1800  vancomycin  (VANCOREADY) IVPB 1750 mg/350 mL        1,750 mg 175 mL/hr over 120 Minutes Intravenous Every 24 hours 08/22/23 1006     08/21/23 0700  vancomycin  (VANCOCIN ) IVPB  1000 mg/200 mL premix  Status:  Discontinued        1,000 mg 200 mL/hr over 60 Minutes Intravenous Every 12 hours 08/21/23 0400 08/22/23 1006   08/21/23 0415  vancomycin  (VANCOCIN ) 750 mg in sodium chloride  0.9 % 250 mL IVPB  Status:  Discontinued        750 mg 250 mL/hr over 60 Minutes Intravenous  Once 08/21/23 0402 08/21/23 0642   08/21/23 0400  vancomycin  (VANCOREADY) IVPB 750 mg/150 mL  Status:  Discontinued        750 mg 150 mL/hr over 60 Minutes Intravenous  Once 08/21/23 0352 08/21/23 0800   08/21/23 0345  ceFEPIme  (MAXIPIME ) 2 g in sodium chloride  0.9 % 100 mL IVPB  Status:  Discontinued        2 g 200 mL/hr over 30 Minutes Intravenous Every 8 hours 08/21/23 0340 08/23/23 2011   08/20/23 1645  ceFEPIme  (MAXIPIME ) 2 g in sodium chloride  0.9 % 100 mL IVPB        2 g 200 mL/hr over 30 Minutes Intravenous  Once 08/20/23 1641 08/20/23 1844   08/20/23 1645  vancomycin  (VANCOCIN ) IVPB 1000 mg/200 mL premix        1,000 mg 200 mL/hr over 60 Minutes Intravenous  Once 08/20/23 1641 08/20/23 2157  Assessment/Plan: s/p * No surgery found * Assessment: Painful onychomycosis with onychocryptosis.  Plan: Debridement of all 10 toenails in length and thickness sharply using toenail nippers.  Light gauze dressing applied to the right third toe due  to iatrogenic lesion.  Apply antibiotic ointment and Band-Aid to the right third toe until dry.  Patient may follow-up outpatient as needed.    LOS: 4 days    Krystal LELON Rosella 08/24/2023

## 2023-08-24 NOTE — Plan of Care (Signed)
  Problem: Fluid Volume: Goal: Hemodynamic stability will improve Outcome: Progressing   Problem: Clinical Measurements: Goal: Diagnostic test results will improve Outcome: Progressing Goal: Signs and symptoms of infection will decrease Outcome: Progressing   Problem: Respiratory: Goal: Ability to maintain adequate ventilation will improve Outcome: Progressing   Problem: Education: Goal: Knowledge of General Education information will improve Description: Including pain rating scale, medication(s)/side effects and non-pharmacologic comfort measures Outcome: Progressing   Problem: Health Behavior/Discharge Planning: Goal: Ability to manage health-related needs will improve Outcome: Progressing   Problem: Clinical Measurements: Goal: Ability to maintain clinical measurements within normal limits will improve Outcome: Progressing Goal: Will remain free from infection Outcome: Progressing Goal: Diagnostic test results will improve Outcome: Progressing Goal: Respiratory complications will improve Outcome: Progressing Goal: Cardiovascular complication will be avoided Outcome: Progressing   Problem: Activity: Goal: Risk for activity intolerance will decrease Outcome: Progressing   Problem: Nutrition: Goal: Adequate nutrition will be maintained Outcome: Progressing   Problem: Coping: Goal: Level of anxiety will decrease Outcome: Progressing   Problem: Pain Managment: Goal: General experience of comfort will improve and/or be controlled Outcome: Progressing   Problem: Safety: Goal: Ability to remain free from injury will improve Outcome: Progressing   Problem: Skin Integrity: Goal: Risk for impaired skin integrity will decrease Outcome: Progressing

## 2023-08-24 NOTE — Progress Notes (Signed)
 Date of Admission:  08/20/2023      ID: Niel Peretti is a 73 y.o. 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, L1-2   Psoas abscess (HCC)   Aspiration pneumonia (HCC)   Bipolar disorder (HCC)   Dementia without behavioral disturbance (HCC)   AAA (abdominal aortic aneurysm) (HCC)   Aortic atherosclerosis (HCC)   Acute metabolic encephalopathy   PVD (peripheral vascular disease) (HCC)   Discitis of lumbar region   Fungal infection of toenail   Hypotension    Subjective: Patient is awake Cleaning himself   Medications:   amLODipine   5 mg Oral Daily   ammonium lactate    Topical BID   atorvastatin   40 mg Oral QPM   citalopram   40 mg Oral Daily   clopidogrel   75 mg Oral Daily   ferrous sulfate   325 mg Oral Q breakfast   guaiFENesin   600 mg Oral BID   methimazole   5 mg Oral TID   risperiDONE   1 mg Oral QHS   tamsulosin   0.4 mg Oral QPC supper    Objective: Vital signs in last 24 hours: Patient Vitals for the past 24 hrs:  BP Temp Pulse Resp SpO2  08/24/23 1947 135/82 98.1 F (36.7 C) (!) 102 18 98 %  08/24/23 1947 135/82 98.1 F (36.7 C) (!) 102 18 100 %  08/24/23 1625 122/62 98.7 F (37.1 C) (!) 110 17 100 %  08/24/23 0839 122/88 -- 81 16 96 %  08/24/23 0327 (!) 155/74 97.8 F (36.6 C) 74 16 99 %       PHYSICAL EXAM:  General: Awake, alert, oriented to self Lungs: Bilateral air entry Heart: Irregular  abdomen: Soft, non-tender,not distended. Bowel sounds normal. No masses Extremities: atraumatic, no cyanosis. No edema. No clubbing Skin: No rashes or lesions. Or bruising Lymph: Cervical, supraclavicular normal. Neurologic: Grossly non-focal Toenails have onychogryphosis.  On the right  foot the third nail is broken and is bleeding Lab Results    Latest Ref Rng & Units 08/23/2023    5:29 AM 08/22/2023    6:16 AM 08/21/2023    6:40 AM  CBC  WBC 4.0 - 10.5 K/uL 5.3  6.6  6.6   Hemoglobin 13.0 - 17.0 g/dL 88.5  88.9  88.8   Hematocrit 39.0 - 52.0 % 35.3  35.0  35.0   Platelets 150 - 400 K/uL 126  124  127        Latest Ref Rng & Units 08/24/2023    5:43 AM 08/23/2023    5:29 AM 08/22/2023    6:16 AM  CMP  Glucose 70 - 99 mg/dL  79  79   BUN 8 - 23 mg/dL  8  10   Creatinine 9.38 - 1.24 mg/dL 9.45  9.42  9.42   Sodium 135 - 145 mmol/L  135  135   Potassium 3.5 - 5.1 mmol/L  3.5  3.5   Chloride 98 - 111 mmol/L  100  102   CO2 22 - 32 mmol/L  24  22   Calcium  8.9 - 10.3 mg/dL  9.2  9.1       Microbiology: Lumbar disc space culture is ESBL E. coli    Assessment/Plan:  73 year old male  coming from group home with altered mental status.  He has a history of schizophrenia, bipolar disorder, COPD and recent right hip fracture with ORIF which is healed well He has a history of ESBL E. coli bacteremia.  L1 and L2 discitis and osteomyelitis.  Status post aspiration of the disc space showed ESBL E. coli.  Patient is currently on meropenem  and vancomycin .  Will discontinue vancomycin .  is going to need at least 6 weeks of antibiotics.  Once a day ertapenem  is a choice on discharge   Right hip surgical site is healed well  Schizophrenia on risperidone   Hyperthyroidism on methimazole   Hypertension on amlodipine   Hyperlipidemia on atorvastatin   COPD on nebulizers  Right renal lower pole cystic lesion  Inferior abdominal aorta aneurysmal dilatation.  Discussed the management with the care team.

## 2023-08-25 DIAGNOSIS — M4646 Discitis, unspecified, lumbar region: Secondary | ICD-10-CM | POA: Diagnosis not present

## 2023-08-25 DIAGNOSIS — Z1612 Extended spectrum beta lactamase (ESBL) resistance: Secondary | ICD-10-CM | POA: Diagnosis not present

## 2023-08-25 DIAGNOSIS — A498 Other bacterial infections of unspecified site: Secondary | ICD-10-CM | POA: Diagnosis not present

## 2023-08-25 DIAGNOSIS — R6520 Severe sepsis without septic shock: Secondary | ICD-10-CM | POA: Diagnosis not present

## 2023-08-25 DIAGNOSIS — A419 Sepsis, unspecified organism: Secondary | ICD-10-CM | POA: Diagnosis not present

## 2023-08-25 LAB — CULTURE, BLOOD (ROUTINE X 2)
Culture: NO GROWTH
Culture: NO GROWTH

## 2023-08-25 LAB — BASIC METABOLIC PANEL
Anion gap: 8 (ref 5–15)
BUN: 11 mg/dL (ref 8–23)
CO2: 24 mmol/L (ref 22–32)
Calcium: 9.2 mg/dL (ref 8.9–10.3)
Chloride: 101 mmol/L (ref 98–111)
Creatinine, Ser: 0.56 mg/dL — ABNORMAL LOW (ref 0.61–1.24)
GFR, Estimated: >60 mL/min (ref >60)
Glucose, Bld: 82 mg/dL (ref 70–99)
Potassium: 3.7 mmol/L (ref 3.5–5.1)
Sodium: 133 mmol/L — ABNORMAL LOW (ref 135–145)

## 2023-08-25 LAB — CBC
HCT: 33.7 % — ABNORMAL LOW (ref 39.0–52.0)
Hemoglobin: 10.8 g/dL — ABNORMAL LOW (ref 13.0–17.0)
MCH: 27.3 pg (ref 26.0–34.0)
MCHC: 32 g/dL (ref 30.0–36.0)
MCV: 85.1 fL (ref 80.0–100.0)
Platelets: 149 10*3/uL — ABNORMAL LOW (ref 150–400)
RBC: 3.96 MIL/uL — ABNORMAL LOW (ref 4.22–5.81)
RDW: 16.1 % — ABNORMAL HIGH (ref 11.5–15.5)
WBC: 5.3 10*3/uL (ref 4.0–10.5)
nRBC: 0 % (ref 0.0–0.2)

## 2023-08-25 MED ORDER — MUPIROCIN CALCIUM 2 % EX CREA: TOPICAL_CREAM | Freq: Every day | CUTANEOUS | Status: DC

## 2023-08-25 MED ORDER — MUPIROCIN CALCIUM 2 % EX CREA
TOPICAL_CREAM | Freq: Every day | CUTANEOUS | Status: DC
  Filled 2023-08-25: qty 15

## 2023-08-25 MED ORDER — ENOXAPARIN SODIUM 40 MG/0.4ML IJ SOSY
40.0000 mg | PREFILLED_SYRINGE | INTRAMUSCULAR | Status: DC
  Administered 2023-08-25 – 2023-08-31 (×7): 40 mg via SUBCUTANEOUS
  Filled 2023-08-25 (×7): qty 0.4

## 2023-08-25 NOTE — Progress Notes (Signed)
 Occupational Therapy Treatment Patient Details Name: [name] MRN: [id number] DOB: [birth year] Today's Date: 08/25/2023   History of present illness 73 year old man past medical history of dementia, bipolar disorder, hypertension, COPD, diastolic congestive heart failure, stroke, hypothyroidism, ESBL UTI and history of right hip fracture with local cellulitis in November 2024.  In the ER he was hypotensive and tachycardic with an elevated white count and lactic acid .   OT comments  [name] was seen for OT treatment on this date. Upon arrival to room pt seated EOB, agreeable to tx. Pt requires MAX A don B socks in sitting. Initial sitting balance CGA however when standing attempts initiated pt pushing heavily posterior requiring MAX A to correct. MAX A x2 to stand with BUE locked in extension and pushing backwards, improves with use of RW to MIN A static standing. Cognition limiting advancing mobility. MOD A x2 bed>chair t/f. Pt making progress toward goals, will continue to follow POC. Discharge recommendation remains appropriate.        If plan is discharge home, recommend the following:  Two people to help with walking and/or transfers;Two people to help with bathing/dressing/bathroom   Equipment Recommendations  BSC/3in1    Recommendations for Other Services      Precautions / Restrictions Precautions Precautions: Fall Restrictions Weight Bearing Restrictions Per Provider Order: No       Mobility Bed Mobility               General bed mobility comments: seated EOB on arrival    Transfers Overall transfer level: Needs assistance Equipment used: Rolling walker (2 wheels) Transfers: Sit to/from Stand, Bed to chair/wheelchair/BSC Sit to Stand: +2 physical assistance, Max assist Stand pivot transfers: Mod assist, +2 safety/equipment         General transfer comment: cognition limiting mobility, pushing backwards in sitting only with attempts to initiate standing      Balance Overall balance assessment: Needs assistance Sitting-balance support: Feet supported, Single extremity supported Sitting balance-Leahy Scale: Fair     Standing balance support: Bilateral upper extremity supported, Reliant on assistive device for balance, During functional activity Standing balance-Leahy Scale: Poor                             ADL either performed or assessed with clinical judgement   ADL Overall ADL's : Needs assistance/impaired                                       General ADL Comments: MAX A don B socks in sitting. MOD A x2 simulated BSC t/f. SETUP seated self-feeding      Cognition Arousal: Alert Behavior During Therapy: Flat affect Overall Cognitive Status: No family/caregiver present to determine baseline cognitive functioning                                 General Comments: minimally verbal throughout session                   Pertinent Vitals/ Pain       Pain Assessment Pain Assessment: No/denies pain   Frequency  Min 1X/week        Progress Toward Goals  OT Goals(current goals can now be found in the care plan section)  Progress towards OT goals: Progressing toward goals  Acute Rehab OT Goals OT Goal Formulation: With patient Time For Goal Achievement: 09/06/23 Potential to Achieve Goals: Good ADL Goals Pt Will Perform Grooming: with min assist;sitting Pt Will Perform Lower Body Dressing: with min assist;sitting/lateral leans Pt Will Transfer to Toilet: with min assist;stand pivot transfer;bedside commode  Plan      Co-evaluation      Reason for Co-Treatment: Necessary to address cognition/behavior during functional activity;For patient/therapist safety PT goals addressed during session: Mobility/safety with mobility OT goals addressed during session: ADL's and self-care      AM-PAC OT 6 Clicks Daily Activity     Outcome Measure   Help from another person eating  meals?: None Help from another person taking care of personal grooming?: A Little Help from another person toileting, which includes using toliet, bedpan, or urinal?: A Lot Help from another person bathing (including washing, rinsing, drying)?: A Lot Help from another person to put on and taking off regular upper body clothing?: A Little Help from another person to put on and taking off regular lower body clothing?: A Lot 6 Click Score: 16    End of Session Equipment Utilized During Treatment: Gait belt;Rolling walker (2 wheels)  OT Visit Diagnosis: Unsteadiness on feet (R26.81);Other abnormalities of gait and mobility (R26.89)   Activity Tolerance Patient tolerated treatment well   Patient Left in chair;with call bell/phone within reach;with chair alarm set   Nurse Communication          Time: 8879-8868 OT Time Calculation (min): 11 min  Charges: OT General Charges $OT Visit: 1 Visit OT Treatments $Self Care/Home Management : 8-22 mins  [name], M.S. OTR/L  08/25/23, 12:44 PM  ascom [phone]

## 2023-08-25 NOTE — Progress Notes (Signed)
 Date of Admission:  08/20/2023      ID: [name] is a 73 y.o. 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, L1-2   Psoas abscess (HCC)   Aspiration pneumonia (HCC)   Bipolar disorder (HCC)   Dementia without behavioral disturbance (HCC)   AAA (abdominal aortic aneurysm) (HCC)   Aortic atherosclerosis (HCC)   Acute metabolic encephalopathy   PVD (peripheral vascular disease) (HCC)   Discitis of lumbar region   Fungal infection of toenail   Hypotension    Subjective: Sitting in chair   Medications:   amLODipine   5 mg Oral Daily   ammonium lactate    Topical BID   atorvastatin   40 mg Oral QPM   citalopram   40 mg Oral Daily   clopidogrel   75 mg Oral Daily   ferrous sulfate   325 mg Oral Q breakfast   guaiFENesin   600 mg Oral BID   methimazole   5 mg Oral TID   mupirocin  cream   Topical Daily   risperiDONE   1 mg Oral QHS   tamsulosin   0.4 mg Oral QPC supper    Objective: Vital signs in last 24 hours: Patient Vitals for the past 24 hrs:  BP Temp Pulse Resp SpO2  08/25/23 0749 120/77 98 F (36.7 C) 84 18 99 %  08/25/23 0450 (!) 158/74 98.4 F (36.9 C) 72 18 100 %  08/24/23 1947 135/82 98.1 F (36.7 C) (!) 102 18 98 %  08/24/23 1947 135/82 98.1 F (36.7 C) (!) 102 18 100 %  08/24/23 1625 122/62 98.7 F (37.1 C) (!) 110 17 100 %       PHYSICAL EXAM:  General: Awake, alert, oriented to self Lungs: Bilateral air entry Heart: Irregular  abdomen: Soft, non-tender,not distended. Bowel sounds normal. No masses Extremities: atraumatic, no cyanosis. No edema. No clubbing Skin: No rashes or lesions. Or bruising Lymph: Cervical, supraclavicular normal. Neurologic: Grossly non-focal Toenails have  onychogryphosis.  On the right foot the third nail is broken and is bleeding Lab Results    Latest Ref Rng & Units 08/25/2023    6:01 AM 08/23/2023    5:29 AM 08/22/2023    6:16 AM  CBC  WBC 4.0 - 10.5 K/uL 5.3  5.3  6.6   Hemoglobin 13.0 - 17.0 g/dL 89.1  88.5  88.9   Hematocrit 39.0 - 52.0 % 33.7  35.3  35.0   Platelets 150 - 400 K/uL 149  126  124        Latest Ref Rng & Units 08/25/2023    6:01 AM 08/24/2023    5:43 AM 08/23/2023    5:29 AM  CMP  Glucose 70 - 99 mg/dL 82   79   BUN 8 - 23 mg/dL 11   8   Creatinine 9.38 - 1.24 mg/dL 9.43  9.45  9.42   Sodium 135 - 145 mmol/L 133   135   Potassium 3.5 - 5.1 mmol/L 3.7   3.5   Chloride 98 - 111 mmol/L 101   100   CO2 22 - 32 mmol/L 24   24   Calcium  8.9 - 10.3 mg/dL 9.2   9.2       Microbiology: Lumbar disc space culture is ESBL  E. coli    Assessment/Plan:  73 year old male coming from group home with altered mental status.  He has a history of schizophrenia, bipolar disorder, COPD and recent right hip fracture with ORIF which is healed well He has a history of ESBL E. coli bacteremia.  L1 and L2 discitis and osteomyelitis.  Status post aspiration of the disc space showed ESBL E. coli.  Patient is currently on meropenem    is going to need at least 6 weeks of antibiotics.  Once a day ertapenem  is an option on discharge   Right hip surgical site is healed well  Schizophrenia on risperidone   Hyperthyroidism on methimazole   Hypertension on amlodipine   Hyperlipidemia on atorvastatin   COPD on nebulizers  Right renal lower pole cystic lesion  Inferior abdominal aorta aneurysmal dilatation.  Discussed the management with the care team.

## 2023-08-25 NOTE — Progress Notes (Signed)
 PROGRESS NOTE    [name]  FMW:[id number] DOB: 19-Dec-1950 DOA: 08/20/2023 PCP: Caretha Barter, NP  Chief Complaint  Patient presents with   Altered Mental Status    Hospital Course:  [name] is 73 y.o. male with dementia, bipolar disorder, hypertension, COPD, diastolic congestive heart failure, CVA, hypothyroidism, ESBL UTI history, and history of right hip fracture with local cellulitis in November 2024.  He presented to the ER septic.  CT scan revealed cystic lesion in the right kidney, discitis with possible osteomyelitis at L1-L2, significant stenosis in the iliac vessels with occlusion of the right SFA and 3.2 cm abdominal aortic aneurysm.  MRI then confirmed osteomyelitis and discitis at L1-L2 with an abscess in the right psoas muscle.  Interventional radiology was consulted and underwent drainage of the abscess on 2/2.  Infectious disease was consulted.  ESBL E. coli is growing out of the discitis culture.  He is currently on meropenem .  Subjective: No acute events overnight. On evaluation today patient has no acute complaints.      Objective: Vitals:   08/24/23 1625 08/24/23 1947 08/24/23 1947 08/25/23 0450  BP: 122/62 135/82 135/82 (!) 158/74  Pulse: (!) 110 (!) 102 (!) 102 72  Resp: 17 18 18 18   Temp: 98.7 F (37.1 C) 98.1 F (36.7 C) 98.1 F (36.7 C) 98.4 F (36.9 C)  TempSrc:      SpO2: 100% 100% 98% 100%  Weight:      Height:        Intake/Output Summary (Last 24 hours) at 08/25/2023 0746 Last data filed at 08/24/2023 2222 Gross per 24 hour  Intake 100 ml  Output --  Net 100 ml   Filed Weights   08/21/23 1326  Weight: 68.9 kg    Examination: General exam: Appears calm and comfortable, NAD  Respiratory system: No work of breathing, symmetric chest wall expansion Cardiovascular system: S1 & S2 heard, RRR.  Gastrointestinal system: Abdomen is nondistended, soft and nontender.  Neuro: Alert and oriented. No focal neurological deficits. Extremities:  Symmetric, expected ROM Skin: No rashes, lesions Psychiatry: Mood & affect appropriate for situation.   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, L1-2   Psoas abscess (HCC)   Chronic diastolic CHF (congestive heart failure) (HCC)   Aspiration pneumonia (HCC)   Acute metabolic encephalopathy   Hypertension   Thrombocytopenia (HCC)   Aortic atherosclerosis (HCC)   Hyperthyroidism   AAA (abdominal aortic aneurysm) (HCC)   COPD (chronic obstructive pulmonary disease) (HCC)   Bipolar disorder (HCC)   PVD (peripheral vascular disease) (HCC)   Urinary tract infection   Dementia without behavioral disturbance (HCC)   Discitis of lumbar region   Fungal infection of toenail   Hypotension    Severe sepsis - Criteria met on arrival with: Hypotension, tachycardia, leukocytosis, acute metabolic encephalopathy, lactic acidosis, discitis and osteomyelitis source - Antibiotics as below  Discitis and osteomyelitis of lumbar region, L1-2 - Infectious disease consulted - Status post discitis culture with IR 2/2 - Culture growing ESBL E. coli - On Merrem  now, vancomycin  has been discontinued.  Will need once a day ertapenem  for 6 weeks at discharge  Right psoas abscess - Interventional radiology unable to get culture of this area - Antibiotics as above  History of right hip repair November/2024 - Complicated by right hip cellulitis at that time - Appears stable at this time  Aspiration pneumonia - Currently  on Merrem  and vancomycin  - On dysphagia 2 diet  Chronic diastolic heart failure - Euvolemic, hold diuretics for now  Acute metabolic encephalopathy - Secondary to sepsis - Appears at baseline now  Hypertension - Continue Norvasc   Thrombocytopenia - Secondary to sepsis - Trend CBC  Hyperthyroidism - Continue methimazole   Abdominal aortic aneurysm Aortic atherosclerosis -  Continue statin  Peripheral vascular disease - Needs vascular follow-up outpatient - Continue Plavix  and statin for now - Had aspirin allergy, noted  Bipolar disorder Schizophrenia Dementia without behavioral disturbance - Continue home meds Risperdal  and Celexa   COPD - Not currently in exacerbation - As needed DuoNebs  Urinary tract infection, ruled out.  Urine culture negative  Fungal infection of toenail Toenail overgrowth - Podiatry completed bedside debridement 2/4  Normocytic anemia - Stable, trend  Physical deconditioning PT score: 10 OT score: 16 - Needs SNF at discharge.  TOC consulted  DVT prophylaxis Lovenox     Code Status: Full Code Family Communication: Discussed directly with patient Disposition:  Status is: Inpatient, patient is medically cleared 2/5. Pending SNF arrangements.  Consultants:    Treatment Team:  Consulting Physician: Fayette Bodily, MD Consulting Physician: Lennie Barter, DPM  Procedures:    Antimicrobials:  Anti-infectives (From admission, onward)    Start     Dose/Rate Route Frequency Ordered Stop   08/23/23 2130  meropenem  (MERREM ) 1 g in sodium chloride  0.9 % 100 mL IVPB        1 g 200 mL/hr over 30 Minutes Intravenous Every 8 hours 08/23/23 2037     08/22/23 1800  vancomycin  (VANCOREADY) IVPB 1750 mg/350 mL  Status:  Discontinued        1,750 mg 175 mL/hr over 120 Minutes Intravenous Every 24 hours 08/22/23 1006 08/24/23 1530   08/21/23 0700  vancomycin  (VANCOCIN ) IVPB 1000 mg/200 mL premix  Status:  Discontinued        1,000 mg 200 mL/hr over 60 Minutes Intravenous Every 12 hours 08/21/23 0400 08/22/23 1006   08/21/23 0415  vancomycin  (VANCOCIN ) 750 mg in sodium chloride  0.9 % 250 mL IVPB  Status:  Discontinued        750 mg 250 mL/hr over 60 Minutes Intravenous  Once 08/21/23 0402 08/21/23 0642   08/21/23 0400  vancomycin  (VANCOREADY) IVPB 750 mg/150 mL  Status:  Discontinued        750 mg 150 mL/hr over 60  Minutes Intravenous  Once 08/21/23 0352 08/21/23 0800   08/21/23 0345  ceFEPIme  (MAXIPIME ) 2 g in sodium chloride  0.9 % 100 mL IVPB  Status:  Discontinued        2 g 200 mL/hr over 30 Minutes Intravenous Every 8 hours 08/21/23 0340 08/23/23 2011   08/20/23 1645  ceFEPIme  (MAXIPIME ) 2 g in sodium chloride  0.9 % 100 mL IVPB        2 g 200 mL/hr over 30 Minutes Intravenous  Once 08/20/23 1641 08/20/23 1844   08/20/23 1645  vancomycin  (VANCOCIN ) IVPB 1000 mg/200 mL premix        1,000 mg 200 mL/hr over 60 Minutes Intravenous  Once 08/20/23 1641 08/20/23 2157       Data Reviewed: I have personally reviewed following labs and imaging studies CBC: Recent Labs  Lab 08/20/23 1542 08/20/23 1847 08/21/23 0640 08/22/23 0616 08/23/23 0529 08/25/23 0601  WBC QUESTIONABLE IDENTIFICATION / INCORRECTLY LABELED SPECIMEN 11.3* 6.6 6.6 5.3 5.3  NEUTROABS QUESTIONABLE IDENTIFICATION / INCORRECTLY LABELED SPECIMEN 10.5*  --   --   --   --  HGB QUESTIONABLE IDENTIFICATION / INCORRECTLY LABELED SPECIMEN 12.6* 11.1* 11.0* 11.4* 10.8*  HCT QUESTIONABLE IDENTIFICATION / INCORRECTLY LABELED SPECIMEN 39.6 35.0* 35.0* 35.3* 33.7*  MCV QUESTIONABLE IDENTIFICATION / INCORRECTLY LABELED SPECIMEN 86.8 86.2 86.0 85.1 85.1  PLT QUESTIONABLE IDENTIFICATION / INCORRECTLY LABELED SPECIMEN 123* 127* 124* 126* 149*   Basic Metabolic Panel: Recent Labs  Lab 08/20/23 2100 08/21/23 0640 08/22/23 0616 08/23/23 0529 08/24/23 0543 08/25/23 0601  NA 138 138 135 135  --  133*  K 4.0 3.7 3.5 3.5  --  3.7  CL 106 104 102 100  --  101  CO2 23 25 22 24   --  24  GLUCOSE 115* 76 79 79  --  82  BUN 20 16 10 8   --  11  CREATININE 0.75 0.59* 0.57* 0.57* 0.54* 0.56*  CALCIUM  9.0 9.2 9.1 9.2  --  9.2   GFR: Estimated Creatinine Clearance: 75.3 mL/min (A) (by C-G formula based on SCr of 0.56 mg/dL (L)). Liver Function Tests: Recent Labs  Lab 08/20/23 1542 08/20/23 2100  AST QUESTIONABLE IDENTIFICATION / INCORRECTLY  LABELED SPECIMEN 13*  ALT QUESTIONABLE IDENTIFICATION / INCORRECTLY LABELED SPECIMEN 10  ALKPHOS QUESTIONABLE IDENTIFICATION / INCORRECTLY LABELED SPECIMEN 89  BILITOT 1.7* 1.1  PROT QUESTIONABLE IDENTIFICATION / INCORRECTLY LABELED SPECIMEN 7.0  ALBUMIN QUESTIONABLE IDENTIFICATION / INCORRECTLY LABELED SPECIMEN 2.9*   CBG: No results for input(s): GLUCAP in the last 168 hours.  Recent Results (from the past 240 hours)  Resp panel by RT-PCR (RSV, Flu A&B, Covid) Anterior Nasal Swab     Status: None   Collection Time: 08/20/23  5:19 PM   Specimen: Anterior Nasal Swab  Result Value Ref Range Status   SARS Coronavirus 2 by RT PCR NEGATIVE NEGATIVE Final    Comment: (NOTE) SARS-CoV-2 target nucleic acids are NOT DETECTED.  The SARS-CoV-2 RNA is generally detectable in upper respiratory specimens during the acute phase of infection. The lowest concentration of SARS-CoV-2 viral copies this assay can detect is 138 copies/mL. A negative result does not preclude SARS-Cov-2 infection and should not be used as the sole basis for treatment or other patient management decisions. A negative result may occur with  improper specimen collection/handling, submission of specimen other than nasopharyngeal swab, presence of viral mutation(s) within the areas targeted by this assay, and inadequate number of viral copies(<138 copies/mL). A negative result must be combined with clinical observations, patient history, and epidemiological information. The expected result is Negative.  Fact Sheet for Patients:  bloggercourse.com  Fact Sheet for Healthcare Providers:  seriousbroker.it  This test is no t yet approved or cleared by the [place]  FDA and  has been authorized for detection and/or diagnosis of SARS-CoV-2 by FDA under an Emergency Use Authorization (EUA). This EUA will remain  in effect (meaning this test can be used) for the duration of  the COVID-19 declaration under Section 564(b)(1) of the Act, 21 U.S.C.section 360bbb-3(b)(1), unless the authorization is terminated  or revoked sooner.       Influenza A by PCR NEGATIVE NEGATIVE Final   Influenza B by PCR NEGATIVE NEGATIVE Final    Comment: (NOTE) The Xpert Xpress SARS-CoV-2/FLU/RSV plus assay is intended as an aid in the diagnosis of influenza from Nasopharyngeal swab specimens and should not be used as a sole basis for treatment. Nasal washings and aspirates are unacceptable for Xpert Xpress SARS-CoV-2/FLU/RSV testing.  Fact Sheet for Patients: bloggercourse.com  Fact Sheet for Healthcare Providers: seriousbroker.it  This test is not yet approved or cleared  by the [place]  FDA and has been authorized for detection and/or diagnosis of SARS-CoV-2 by FDA under an Emergency Use Authorization (EUA). This EUA will remain in effect (meaning this test can be used) for the duration of the COVID-19 declaration under Section 564(b)(1) of the Act, 21 U.S.C. section 360bbb-3(b)(1), unless the authorization is terminated or revoked.     Resp Syncytial Virus by PCR NEGATIVE NEGATIVE Final    Comment: (NOTE) Fact Sheet for Patients: bloggercourse.com  Fact Sheet for Healthcare Providers: seriousbroker.it  This test is not yet approved or cleared by the [place]  FDA and has been authorized for detection and/or diagnosis of SARS-CoV-2 by FDA under an Emergency Use Authorization (EUA). This EUA will remain in effect (meaning this test can be used) for the duration of the COVID-19 declaration under Section 564(b)(1) of the Act, 21 U.S.C. section 360bbb-3(b)(1), unless the authorization is terminated or revoked.  Performed at [facility], [address] [place], [place] [address]   Blood culture (routine x 2)     Status: None   Collection Time:  08/20/23  6:47 PM   Specimen: BLOOD RIGHT ARM  Result Value Ref Range Status   Specimen Description BLOOD RIGHT ARM  Final   Special Requests   Final    AEROBIC BOTTLE ONLY Blood Culture results may not be optimal due to an inadequate volume of blood received in culture bottles   Culture   Final    NO GROWTH 5 DAYS Performed at [facility], [address] [place], [place] [address]    Report Status 08/25/2023 FINAL  Final  Blood culture (routine x 2)     Status: None   Collection Time: 08/20/23  6:47 PM   Specimen: BLOOD RIGHT HAND  Result Value Ref Range Status   Specimen Description BLOOD RIGHT HAND  Final   Special Requests   Final    AEROBIC BOTTLE ONLY Blood Culture results may not be optimal due to an inadequate volume of blood received in culture bottles   Culture   Final    NO GROWTH 5 DAYS Performed at [facility], [address] [place], [place] [address]    Report Status 08/25/2023 FINAL  Final  Urine Culture     Status: Abnormal   Collection Time: 08/20/23  9:19 PM   Specimen: Urine, Random  Result Value Ref Range Status   Specimen Description   Final    URINE, RANDOM Performed at [facility], [address], [place], [place] [address]    Special Requests   Final    NONE Reflexed from [id number] Performed at [facility], [address] [place], [place] [address]    Culture (A)  Final    <10,000 COLONIES/mL INSIGNIFICANT GROWTH Performed at [facility], [address] [place], [place] [address]    Report Status 08/21/2023 FINAL  Final  Aerobic/Anaerobic Culture w Gram Stain (surgical/deep wound)     Status: None (Preliminary result)   Collection Time: 08/22/23  9:00 AM   Specimen: Body Fluid  Result Value Ref Range Status   Specimen Description   Final    FLUID Performed at [facility], [address], [place], [place] [address]    Special Requests   Final    NONE Performed at [facility], [address] [place], [place] [address]    Gram Stain   Final    FEW WBC PRESENT, PREDOMINANTLY PMN NO ORGANISMS SEEN  Performed at [facility], [address] [place], [place] [address]    Culture   Final    RARE ESCHERICHIA COLI Confirmed Extended Spectrum Beta-Lactamase Producer (ESBL).  In bloodstream infections from ESBL organisms, carbapenems are preferred over piperacillin/tazobactam. They are shown to have a lower risk of mortality. NO ANAEROBES ISOLATED; CULTURE IN PROGRESS FOR 5 DAYS    Report Status PENDING  Incomplete   Organism ID, Bacteria ESCHERICHIA COLI  Final      Susceptibility   Escherichia coli - MIC*    AMPICILLIN >=32 RESISTANT Resistant     CEFEPIME  >=32 RESISTANT Resistant     CEFTAZIDIME RESISTANT Resistant     CEFTRIAXONE  >=64 RESISTANT Resistant     CIPROFLOXACIN >=4 RESISTANT Resistant     GENTAMICIN  <=1 SENSITIVE Sensitive     IMIPENEM <=0.25 SENSITIVE Sensitive     TRIMETH/SULFA <=20 SENSITIVE Sensitive     AMPICILLIN/SULBACTAM >=32 RESISTANT Resistant     PIP/TAZO <=4 SENSITIVE Sensitive ug/mL    * RARE ESCHERICHIA COLI     Radiology Studies: No results found.  Scheduled Meds:  amLODipine   5 mg Oral Daily   ammonium lactate    Topical BID   atorvastatin   40 mg Oral QPM   citalopram   40 mg Oral Daily   clopidogrel   75 mg Oral Daily   ferrous sulfate   325 mg Oral Q breakfast   guaiFENesin   600 mg Oral BID   methimazole   5 mg Oral TID   mupirocin  cream   Topical Daily   risperiDONE   1 mg Oral QHS   tamsulosin   0.4 mg Oral QPC supper   Continuous Infusions:  meropenem  (MERREM ) IV 1 g (08/25/23 0609)     LOS: 5 days    Time spent:  55min  Ronald Wish, DO [facility]  To contact the attending physician between 7A-7P please use Epic Chat. To contact the covering physician during after hours 7P-7A, please review Amion.   08/25/2023, 7:46 AM   *This document has been created with the assistance of dictation  software. Please excuse typographical errors. *

## 2023-08-25 NOTE — Progress Notes (Signed)
 Physical Therapy Treatment Patient Details Name: [name] MRN: [id number] DOB: 1951/04/21 Today's Date: 08/25/2023   History of Present Illness 73 year old man past medical history of dementia, bipolar disorder, hypertension, COPD, diastolic congestive heart failure, stroke, hypothyroidism, ESBL UTI and history of right hip fracture with local cellulitis in November 2024.  In the ER he was hypotensive and tachycardic with an elevated white count and lactic acid .    PT Comments  Patient supine in bed upon arrival, awakens to therapist voice. Patient minimally verbal, despite PT encouragement. PT held out hand, and patient able to sit EOB with MIN A. Increased time required. Good seated balance initially but as session progress patient with heavy posterior lean and resistive to transfers. Pt continue to require MAX A +2 STS with RW from EOB, and then complete Stand Step Transfer with RW, MOD A +2. Left with all needs in reach, chair alarm set. Pt will continue to benefit from acute skilled PT services to address impairments and progress functional mobility. Discharge recommendation remains appropriately.  Will continue to follow acutely.    If plan is discharge home, recommend the following: Two people to help with walking and/or transfers;Two people to help with bathing/dressing/bathroom;Assist for transportation;Help with stairs or ramp for entrance   Can travel by private vehicle     No  Equipment Recommendations  Other (comment) (TBD at next venue of care)    Recommendations for Other Services       Precautions / Restrictions Precautions Precautions: Fall Restrictions Weight Bearing Restrictions Per Provider Order: No     Mobility  Bed Mobility Overal bed mobility: Needs Assistance Bed Mobility: Supine to Sit     Supine to sit: Min assist     General bed mobility comments: Pt able to get from supine > seated EOB with Min A, significant amount of time require, intermittent  single step commands for completion    Transfers Overall transfer level: Needs assistance Equipment used: Rolling walker (2 wheels) Transfers: Sit to/from Stand, Bed to chair/wheelchair/BSC Sit to Stand: +2 physical assistance, Max assist Stand pivot transfers: Mod assist, +2 safety/equipment         General transfer comment: Pt continues w/ significant restitive behavior, posterior lean with attempts to stand. Able to stand with Max A +2 with RW, then once standing progress to Mod A +2 for step pivot from bed > recliner. Patient with multimodal cues for step, assist with management of AD, and intermittent posterior lean.    Ambulation/Gait                   Stairs             Wheelchair Mobility     Tilt Bed    Modified Rankin (Stroke Patients Only)       Balance Overall balance assessment: Needs assistance Sitting-balance support: Bilateral upper extremity supported, Feet supported Sitting balance-Leahy Scale: Poor Sitting balance - Comments: patient seated balance fluctuates throughout session, but require some assist PRN. Postural control: Posterior lean Standing balance support: Bilateral upper extremity supported, Reliant on assistive device for balance, During functional activity Standing balance-Leahy Scale: Poor Standing balance comment: Poor standing balance; +2 assist; contineut to demo posterior lean in standing                            Cognition Arousal: Alert Behavior During Therapy: Flat affect Overall Cognitive Status: No family/caregiver present to determine baseline cognitive functioning  General Comments: minimally verbal throughout session        Exercises      General Comments        Pertinent Vitals/Pain Pain Assessment Pain Assessment: No/denies pain    Home Living                          Prior Function            PT Goals (current goals can  now be found in the care plan section) Acute Rehab PT Goals PT Goal Formulation: Patient unable to participate in goal setting Time For Goal Achievement: 09/06/23 Potential to Achieve Goals: Fair Progress towards PT goals: Progressing toward goals    Frequency    Min 1X/week      PT Plan      Co-evaluation   Reason for Co-Treatment: Necessary to address cognition/behavior during functional activity;For patient/therapist safety PT goals addressed during session: Mobility/safety with mobility OT goals addressed during session: ADL's and self-care      AM-PAC PT 6 Clicks Mobility   Outcome Measure  Help needed turning from your back to your side while in a flat bed without using bedrails?: A Lot Help needed moving from lying on your back to sitting on the side of a flat bed without using bedrails?: A Lot Help needed moving to and from a bed to a chair (including a wheelchair)?: A Lot Help needed standing up from a chair using your arms (e.g., wheelchair or bedside chair)?: A Lot Help needed to walk in hospital room?: Total Help needed climbing 3-5 steps with a railing? : Total 6 Click Score: 10    End of Session Equipment Utilized During Treatment: Gait belt Activity Tolerance: Patient tolerated treatment well Patient left: in chair;with call bell/phone within reach;with chair alarm set Nurse Communication: Mobility status PT Visit Diagnosis: Difficulty in walking, not elsewhere classified (R26.2);Other abnormalities of gait and mobility (R26.89);Muscle weakness (generalized) (M62.81);Unsteadiness on feet (R26.81)     Time: 1110-1130 PT Time Calculation (min) (ACUTE ONLY): 20 min  Charges:    $Therapeutic Activity: 8-22 mins PT General Charges $$ ACUTE PT VISIT: 1 Visit                    Carolyn CHRISTELLA Kingfisher, PT, DPT 08/25/23 12:34 PM

## 2023-08-26 ENCOUNTER — Inpatient Hospital Stay: Payer: Medicare HMO

## 2023-08-26 DIAGNOSIS — B9620 Unspecified Escherichia coli [E. coli] as the cause of diseases classified elsewhere: Secondary | ICD-10-CM

## 2023-08-26 DIAGNOSIS — Z1612 Extended spectrum beta lactamase (ESBL) resistance: Secondary | ICD-10-CM | POA: Diagnosis not present

## 2023-08-26 DIAGNOSIS — M4626 Osteomyelitis of vertebra, lumbar region: Secondary | ICD-10-CM

## 2023-08-26 DIAGNOSIS — R6520 Severe sepsis without septic shock: Secondary | ICD-10-CM | POA: Diagnosis not present

## 2023-08-26 DIAGNOSIS — A419 Sepsis, unspecified organism: Secondary | ICD-10-CM | POA: Diagnosis not present

## 2023-08-26 DIAGNOSIS — M4646 Discitis, unspecified, lumbar region: Secondary | ICD-10-CM | POA: Diagnosis not present

## 2023-08-26 MED ORDER — GADOBUTROL 1 MMOL/ML IV SOLN
6.0000 mL | Freq: Once | INTRAVENOUS | Status: AC | PRN
  Administered 2023-08-26: 6 mL via INTRAVENOUS

## 2023-08-26 NOTE — [place] FL2 (Addendum)
 [place]  MEDICAID FL2 LEVEL OF CARE FORM     IDENTIFICATION  Patient Name: [name] Birthdate: 09-07-1950 Sex: male Admission Date (Current Location): 08/20/2023  [facility] and [place] Number:  Chiropodist and Address:  [facility], [address] [place], [place] [address]      Provider Number: [id number]  Attending Physician Name and Address:  Leesa Kast, DO  Relative Name and Phone Number:  [place], [facility])  [phone] (    Current Level of Care: Hospital Recommended Level of Care: Skilled Nursing Facility Prior Approval Number:    Date Approved/Denied:   PASRR Number: pending  Discharge Plan:  SNF    Current Diagnoses: Patient Active Problem List   Diagnosis Date Noted   Hypotension 08/24/2023   Fungal infection of toenail 08/23/2023   Discitis of lumbar region 08/22/2023   Aspiration pneumonia (HCC) 08/21/2023   Dementia without behavioral disturbance (HCC) 08/21/2023   AAA (abdominal aortic aneurysm) (HCC) 08/21/2023   Aortic atherosclerosis (HCC) 08/21/2023   Acute metabolic encephalopathy 08/21/2023   PVD (peripheral vascular disease) (HCC) 08/21/2023   Bipolar disorder (HCC)    Severe sepsis (HCC) 08/20/2023   Urinary tract infection 08/20/2023   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, L1-2 08/20/2023   Psoas abscess (HCC) 08/20/2023   Closed displaced intertrochanteric fracture of right femur (HCC) 05/08/2023   Protein-calorie malnutrition, severe 05/07/2023   Cellulitis of right hip 05/07/2023   Pressure injury of skin 05/07/2023   Chronic diastolic CHF (congestive heart failure) (HCC) 05/06/2023   Hyperthyroidism 05/06/2023   Fall 03/17/2023   Scalp laceration 03/17/2023   Infection due to ESBL-producing Escherichia coli 03/17/2023   E coli bacteremia 03/15/2023   Hypothyroidism 03/05/2023   Syncope and collapse 03/04/2023   History of stroke 04/13/2021   Hyponatremia 04/10/2021    Wheeze    Unsteady gait    Thrombocytopenia (HCC)    COVID-19 virus infection    Elevated d-dimer    Depression    Elevated LFTs    Weakness 04/02/2021   Vitamin D  deficiency 08/29/2015   Prediabetes 08/29/2015   Hypertension 08/28/2015   GERD (gastroesophageal reflux disease) 08/28/2015   Hyperlipidemia 08/28/2015   BPH (benign prostatic hyperplasia) 08/28/2015   Hx of sleep apnea 08/28/2015   COPD (chronic obstructive pulmonary disease) (HCC) 08/28/2015   Smoker 08/28/2015   Hx of lower gastrointestinal bleeding 08/28/2015   Hx of gastritis 08/28/2015   Bipolar 1 disorder (HCC) 08/28/2015   Schizophrenia (HCC) 08/28/2015    Orientation RESPIRATION BLADDER Height & Weight     Self, Place  Normal Incontinent Weight: 152 lb (68.9 kg) Height:  5' 6 (167.6 cm)  BEHAVIORAL SYMPTOMS/MOOD NEUROLOGICAL BOWEL NUTRITION STATUS      Incontinent    AMBULATORY STATUS COMMUNICATION OF NEEDS Skin   Extensive Assist Verbally  (Wound on R toe)                       Personal Care Assistance Level of Assistance  Bathing, Feeding, Dressing Bathing Assistance: Maximum assistance Feeding assistance: Maximum assistance Dressing Assistance: Maximum assistance     Functional Limitations Info  Sight, Hearing, Speech Sight Info: Adequate Hearing Info: Adequate Speech Info: Adequate    SPECIAL CARE FACTORS FREQUENCY  PT (By licensed PT), OT (By licensed OT)     PT Frequency: 5 times a week OT Frequency: 5 times a week            Contractures Contractures  Info: Not present    Additional Factors Info  Code Status, Isolation Precautions Code Status Info: FULL       Isolation Precautions Info: ESBL     Current Medications (08/26/2023):  This is the current hospital active medication list Current Facility-Administered Medications  Medication Dose Route Frequency Provider Last Rate Last Admin   acetaminophen  (TYLENOL ) tablet 650 mg  650 mg Oral Q6H PRN Duncan, Hazel V, MD        Or   acetaminophen  (TYLENOL ) suppository 650 mg  650 mg Rectal Q6H PRN Cleatus Delayne GAILS, MD       albuterol  (PROVENTIL ) (2.5 MG/3ML) 0.083% nebulizer solution 2.5 mg  2.5 mg Nebulization Q2H PRN Duncan, Hazel V, MD       amLODipine  (NORVASC ) tablet 5 mg  5 mg Oral Daily Josette Ade, MD   5 mg at 08/26/23 9073   ammonium lactate  (LAC-HYDRIN ) 12 % lotion   Topical BID Josette Ade, MD   Given at 08/26/23 9073   atorvastatin  (LIPITOR) tablet 40 mg  40 mg Oral QPM Josette Ade, MD   40 mg at 08/25/23 1700   citalopram  (CELEXA ) tablet 40 mg  40 mg Oral Daily Josette Ade, MD   40 mg at 08/26/23 9073   clopidogrel  (PLAVIX ) tablet 75 mg  75 mg Oral Daily Josette Ade, MD   75 mg at 08/26/23 9073   enoxaparin  (LOVENOX ) injection 40 mg  40 mg Subcutaneous Q24H Dezii, Alexandra, DO   40 mg at 08/25/23 2311   ferrous sulfate  tablet 325 mg  325 mg Oral Q breakfast Cleatus Delayne GAILS, MD   325 mg at 08/26/23 9073   guaiFENesin  (MUCINEX ) 12 hr tablet 600 mg  600 mg Oral BID Duncan, Hazel V, MD   600 mg at 08/26/23 9073   haloperidol  lactate (HALDOL ) injection 1 mg  1 mg Intravenous Q6H PRN Josette Ade, MD   1 mg at 08/22/23 1349   HYDROcodone -acetaminophen  (NORCO/VICODIN) 5-325 MG per tablet 1-2 tablet  1-2 tablet Oral Q6H PRN Duncan, Hazel V, MD   2 tablet at 08/22/23 1710   meropenem  (MERREM ) 1 g in sodium chloride  0.9 % 100 mL IVPB  1 g Intravenous Q8H Patel, Kishan S, RPH 200 mL/hr at 08/26/23 0539 1 g at 08/26/23 0539   methimazole  (TAPAZOLE ) tablet 5 mg  5 mg Oral TID Duncan, Hazel V, MD   5 mg at 08/26/23 9073   mupirocin  cream (BACTROBAN ) 2 %   Topical Daily Josette Ade, MD   Given at 08/26/23 9073   ondansetron  (ZOFRAN ) tablet 4 mg  4 mg Oral Q6H PRN Duncan, Hazel V, MD       Or   ondansetron  (ZOFRAN ) injection 4 mg  4 mg Intravenous Q6H PRN Cleatus Delayne GAILS, MD       risperiDONE  (RISPERDAL ) tablet 1 mg  1 mg Oral QHS Wieting, Richard, MD   1 mg at 08/25/23 2310    tamsulosin  (FLOMAX ) capsule 0.4 mg  0.4 mg Oral QPC supper Josette Ade, MD   0.4 mg at 08/25/23 1700     Discharge Medications: Please see discharge summary for a list of discharge medications.  Relevant Imaging Results:  Relevant Lab Results:   Additional Information [id number], Pt will need antibiotics until.3/18 IV  Brayley Mackowiak, LCSW

## 2023-08-26 NOTE — Plan of Care (Signed)

## 2023-08-26 NOTE — Consult Note (Signed)
 Pharmacy Antibiotic Note  [name] is a 73 y.o. male admitted on 08/20/2023 with  lumbar discitis/osteo .  Pharmacy has been consulted for meropenem  dosing. Afeb, WBC WNL, Last year he had ESBL E. coli bacteremia, AMS, recent righ hip fracture with ORIF, L1 and L2 discitis and osteomyelitis.  Status post aspiration of the disc and cultures grew ESBL E coli.   Today, 08/26/2023 Day #3 meropenem  Renal: Scr WNL 2/5 Afeb WBC WNL 2/5  Plan: Continue Meropenem  1 g q8H  Sign-off note writing for meropenem  dosing as do not anticipate need to adjust dose.  Will follow peripherally.   Plan for OPAT - likely SNF as patient from group home prior to admission.  Await orders for PICC ID following  Height: 5' 6 (167.6 cm) Weight: 68.9 kg (152 lb) IBW/kg (Calculated) : 63.8  Temp (24hrs), Avg:98.7 F (37.1 C), Min:98.2 F (36.8 C), Max:99.2 F (37.3 C)  Recent Labs  Lab 08/20/23 1542 08/20/23 1713 08/20/23 1847 08/20/23 2100 08/21/23 0640 08/22/23 0616 08/23/23 0529 08/24/23 0543 08/25/23 0601  WBC QUESTIONABLE IDENTIFICATION / INCORRECTLY LABELED SPECIMEN  --  11.3*  --  6.6 6.6 5.3  --  5.3  CREATININE QUESTIONABLE IDENTIFICATION / INCORRECTLY LABELED SPECIMEN  --   --  0.75 0.59* 0.57* 0.57* 0.54* 0.56*  LATICACIDVEN QUESTIONABLE IDENTIFICATION / INCORRECTLY LABELED SPECIMEN QUESTIONABLE IDENTIFICATION / INCORRECTLY LABELED SPECIMEN 2.1* 1.3  --   --   --   --   --     Estimated Creatinine Clearance: 75.3 mL/min (A) (by C-G formula based on SCr of 0.56 mg/dL (L)).    Allergies  Allergen Reactions   Asa [Aspirin]     Hx GI bleed    Antimicrobials this admission: 1/31 cefepime  >> 2/3 1/31 vancomycin  >> 2/4 2/3 meropenem  >>  Dose adjustments this admission: None  Microbiology results: 1/31 BCx: pending 1/31 UCx: <10,000 COLONIES/mL INSIGNIFICANT GROWTH   2/2 Body fluid cx: ESBL E coli  RARE GRAM NEGATIVE RODS     Thank you for allowing pharmacy to be a part of this  patient's care.  Qianna Clagett, PharmD, BCPS, BCIDP Work Cell: [phone] 08/26/2023 9:33 AM

## 2023-08-26 NOTE — Progress Notes (Signed)
 Date of Admission:  08/20/2023      ID: [name] is a 73 y.o. 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, L1-2   Psoas abscess (HCC)   Aspiration pneumonia (HCC)   Bipolar disorder (HCC)   Dementia without behavioral disturbance (HCC)   AAA (abdominal aortic aneurysm) (HCC)   Aortic atherosclerosis (HCC)   Acute metabolic encephalopathy   PVD (peripheral vascular disease) (HCC)   Discitis of lumbar region   Fungal infection of toenail   Hypotension    Subjective: Pt states he is following okay   Medications:   amLODipine   5 mg Oral Daily   ammonium lactate    Topical BID   atorvastatin   40 mg Oral QPM   citalopram   40 mg Oral Daily   clopidogrel   75 mg Oral Daily   enoxaparin  (LOVENOX ) injection  40 mg Subcutaneous Q24H   ferrous sulfate   325 mg Oral Q breakfast   guaiFENesin   600 mg Oral BID   methimazole   5 mg Oral TID   mupirocin  cream   Topical Daily   risperiDONE   1 mg Oral QHS   tamsulosin   0.4 mg Oral QPC supper    Objective: Vital signs in last 24 hours: Patient Vitals for the past 24 hrs:  BP Temp Temp src Pulse Resp SpO2  08/26/23 2227 126/62 99.1 F (37.3 C) Oral 97 16 98 %  08/26/23 1734 125/84 98 F (36.7 C) -- 100 18 93 %  08/26/23 0901 118/66 98.7 F (37.1 C) -- 74 18 96 %  08/26/23 0622 139/79 98.8 F (37.1 C) -- 87 16 96 %       PHYSICAL EXAM:  General: Awake, alert, oriented to self Lungs: Bilateral air entry Heart: Irregular  abdomen: Soft, non-tender,not distended. Bowel sounds normal. No masses Extremities: atraumatic, no cyanosis. No edema. No clubbing Skin: No rashes or lesions. Or bruising Lymph: Cervical, supraclavicular normal. Neurologic: Grossly  non-focal Toenails have onychogryphosis.  On the right foot the third nail is broken and is bleeding Lab Results    Latest Ref Rng & Units 08/25/2023    6:01 AM 08/23/2023    5:29 AM 08/22/2023    6:16 AM  CBC  WBC 4.0 - 10.5 K/uL 5.3  5.3  6.6   Hemoglobin 13.0 - 17.0 g/dL 89.1  88.5  88.9   Hematocrit 39.0 - 52.0 % 33.7  35.3  35.0   Platelets 150 - 400 K/uL 149  126  124        Latest Ref Rng & Units 08/25/2023    6:01 AM 08/24/2023    5:43 AM 08/23/2023    5:29 AM  CMP  Glucose 70 - 99 mg/dL 82   79   BUN 8 - 23 mg/dL 11   8   Creatinine 9.38 - 1.24 mg/dL 9.43  9.45  9.42   Sodium 135 - 145 mmol/L 133   135   Potassium 3.5 - 5.1 mmol/L 3.7   3.5   Chloride 98 - 111 mmol/L 101   100   CO2 22 - 32 mmol/L 24   24   Calcium  8.9 - 10.3 mg/dL 9.2   9.2       Microbiology: Lumbar disc space  culture is ESBL E. coli    Assessment/Plan:  73 year old male coming from group home with altered mental status.  He has a history of schizophrenia, bipolar disorder, COPD and recent right hip fracture with ORIF which is healed well He has a history of ESBL E. coli bacteremia.  L1 and L2 discitis and osteomyelitis.  Status post aspiration of the disc space showed ESBL E. coli.  Patient is currently on meropenem    is going to need at least 6 weeks of antibiotics.  Once a day ertapenem  is an option on discharge=10/03/23   Right hip surgical site is healed well  Schizophrenia on risperidone   Hyperthyroidism on methimazole   Hypertension on amlodipine   Hyperlipidemia on atorvastatin   COPD on nebulizers  Right renal lower pole cystic lesion  Inferior abdominal aorta aneurysmal dilatation.  Discussed the management with the care team.

## 2023-08-26 NOTE — TOC Progression Note (Signed)
 Transition of Care [facility]) - Progression Note    Patient Details  Name: [name] MRN: [id number] Date of Birth: [birth year]  [facility] CM/SW Contact  Ladene Lady, LCSW Phone Number: 08/26/2023, 3:21 PM  Clinical Narrative:   CSW spoke with nephew [place] [facility] [place]. CSW has sent gina message.         Expected Discharge Plan and Services                                               Social Determinants of Health (SDOH) Interventions SDOH Screenings   Food Insecurity: Patient Unable To Answer (08/23/2023)  Housing: Patient Unable To Answer (08/23/2023)  Transportation Needs: Patient Unable To Answer (08/23/2023)  Utilities: Patient Unable To Answer (08/23/2023)  Depression (PHQ2-9): Low Risk  (12/12/2020)  Social Connections: Patient Unable To Answer (08/23/2023)  Tobacco Use: High Risk (08/20/2023)    Readmission Risk Interventions    05/09/2023    4:34 PM  Readmission Risk Prevention Plan  Transportation Screening Complete  PCP or Specialist Appt within 5-7 Days Complete  Home Care Screening Complete  Medication Review (RN CM) Complete

## 2023-08-26 NOTE — Progress Notes (Signed)
 PROGRESS NOTE    [name]  FMW:[id number] DOB: 1950/09/02 DOA: 08/20/2023 PCP: Caretha Barter, NP  Chief Complaint  Patient presents with   Altered Mental Status    Hospital Course:  [name] is 73 y.o. male with dementia, bipolar disorder, hypertension, COPD, diastolic congestive heart failure, CVA, hypothyroidism, ESBL UTI history, and history of right hip fracture with local cellulitis in November 2024.  He presented to the ER septic.  CT scan revealed cystic lesion in the right kidney, discitis with possible osteomyelitis at L1-L2, significant stenosis in the iliac vessels with occlusion of the right SFA and 3.2 cm abdominal aortic aneurysm.  MRI then confirmed osteomyelitis and discitis at L1-L2 with an abscess in the right psoas muscle.  Interventional radiology was consulted and underwent drainage of the abscess on 2/2.  Infectious disease was consulted.  ESBL E. coli is growing out of the discitis culture.  He is currently on meropenem .  Subjective: No acute events overnight.  No Acute complaints.   Objective: Vitals:   08/25/23 0749 08/25/23 1705 08/25/23 2131 08/26/23 0622  BP: 120/77 105/79 (!) 150/97 139/79  Pulse: 84  81 87  Resp: 18 18 16 16   Temp: 98 F (36.7 C) 98.2 F (36.8 C) 99.2 F (37.3 C) 98.8 F (37.1 C)  TempSrc:      SpO2: 99% 100% 94% 96%  Weight:      Height:       No intake or output data in the 24 hours ending 08/26/23 0756  Filed Weights   08/21/23 1326  Weight: 68.9 kg    Examination: General exam: Appears calm and comfortable, NAD  Respiratory system: No work of breathing, symmetric chest wall expansion Cardiovascular system: S1 & S2 heard, RRR.  Gastrointestinal system: Abdomen is nondistended, soft and nontender.  Neuro: alert, does not answer questions consistently, requires significant prompting. Extremities: Symmetric, expected ROM Skin: No rashes, lesions Psychiatry: Mood & affect appropriate for situation.    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, L1-2   Psoas abscess (HCC)   Chronic diastolic CHF (congestive heart failure) (HCC)   Aspiration pneumonia (HCC)   Acute metabolic encephalopathy   Hypertension   Thrombocytopenia (HCC)   Aortic atherosclerosis (HCC)   Hyperthyroidism   AAA (abdominal aortic aneurysm) (HCC)   COPD (chronic obstructive pulmonary disease) (HCC)   Bipolar disorder (HCC)   PVD (peripheral vascular disease) (HCC)   Urinary tract infection   Dementia without behavioral disturbance (HCC)   Discitis of lumbar region   Fungal infection of toenail   Hypotension    Severe sepsis - Criteria met on arrival with: Hypotension, tachycardia, leukocytosis, acute metabolic encephalopathy, lactic acidosis, discitis and osteomyelitis source - Antibiotics as below  Discitis and osteomyelitis of lumbar region, L1-2 - Infectious disease consulted - Status post discitis culture with IR 2/2 - Culture growing ESBL E. coli - On Merrem  now, vancomycin  has been discontinued.  Will need once a day ertapenem  for 6 weeks at discharge  Right psoas abscess - Interventional radiology unable to get culture of this area - Antibiotics as above  History of right hip repair November/2024 - Complicated by right hip cellulitis at that time - Appears stable at this time  Aspiration pneumonia - Currently on Merrem  and vancomycin  - On dysphagia 2 diet  Chronic diastolic heart failure - Euvolemic, hold diuretics for now  Acute metabolic encephalopathy - Secondary to  sepsis - Appears at baseline now  Hypertension - Continue Norvasc   Thrombocytopenia - Secondary to sepsis - Trend CBC  Hyperthyroidism - Continue methimazole   Abdominal aortic aneurysm Aortic atherosclerosis - Continue statin  Peripheral vascular disease - Needs vascular follow-up outpatient - Continue Plavix  and  statin for now - Had aspirin allergy, noted  Bipolar disorder Schizophrenia Dementia without behavioral disturbance - Continue home meds Risperdal  and Celexa   COPD - Not currently in exacerbation - As needed DuoNebs  Urinary tract infection, ruled out.  Urine culture negative  Fungal infection of toenail Toenail overgrowth - Podiatry completed bedside debridement 2/4  Normocytic anemia - Stable, trend  Right Renal Lower Pole Cystic Lesion -Incidentally seen on CT, 2.4 x 2.4, neoplasm is possible - MRI w/wo for better eval. Consult urology if MRI concerning for malignancy  Physical deconditioning PT score: 10 OT score: 16 - Needs SNF at discharge.  TOC consulted  DVT prophylaxis Lovenox     Code Status: Full Code Family Communication: Discussed directly with patient Disposition:  Status is: Inpatient, patient is medically cleared 2/5. Pending SNF arrangements.  Consultants:    Treatment Team:  Consulting Physician: Fayette Bodily, MD Consulting Physician: Lennie Barter, DPM  Procedures:    Antimicrobials:  Anti-infectives (From admission, onward)    Start     Dose/Rate Route Frequency Ordered Stop   08/23/23 2130  meropenem  (MERREM ) 1 g in sodium chloride  0.9 % 100 mL IVPB        1 g 200 mL/hr over 30 Minutes Intravenous Every 8 hours 08/23/23 2037     08/22/23 1800  vancomycin  (VANCOREADY) IVPB 1750 mg/350 mL  Status:  Discontinued        1,750 mg 175 mL/hr over 120 Minutes Intravenous Every 24 hours 08/22/23 1006 08/24/23 1530   08/21/23 0700  vancomycin  (VANCOCIN ) IVPB 1000 mg/200 mL premix  Status:  Discontinued        1,000 mg 200 mL/hr over 60 Minutes Intravenous Every 12 hours 08/21/23 0400 08/22/23 1006   08/21/23 0415  vancomycin  (VANCOCIN ) 750 mg in sodium chloride  0.9 % 250 mL IVPB  Status:  Discontinued        750 mg 250 mL/hr over 60 Minutes Intravenous  Once 08/21/23 0402 08/21/23 0642   08/21/23 0400  vancomycin  (VANCOREADY) IVPB 750  mg/150 mL  Status:  Discontinued        750 mg 150 mL/hr over 60 Minutes Intravenous  Once 08/21/23 0352 08/21/23 0800   08/21/23 0345  ceFEPIme  (MAXIPIME ) 2 g in sodium chloride  0.9 % 100 mL IVPB  Status:  Discontinued        2 g 200 mL/hr over 30 Minutes Intravenous Every 8 hours 08/21/23 0340 08/23/23 2011   08/20/23 1645  ceFEPIme  (MAXIPIME ) 2 g in sodium chloride  0.9 % 100 mL IVPB        2 g 200 mL/hr over 30 Minutes Intravenous  Once 08/20/23 1641 08/20/23 1844   08/20/23 1645  vancomycin  (VANCOCIN ) IVPB 1000 mg/200 mL premix        1,000 mg 200 mL/hr over 60 Minutes Intravenous  Once 08/20/23 1641 08/20/23 2157       Data Reviewed: I have personally reviewed following labs and imaging studies CBC: Recent Labs  Lab 08/20/23 1542 08/20/23 1847 08/21/23 0640 08/22/23 0616 08/23/23 0529 08/25/23 0601  WBC QUESTIONABLE IDENTIFICATION / INCORRECTLY LABELED SPECIMEN 11.3* 6.6 6.6 5.3 5.3  NEUTROABS QUESTIONABLE IDENTIFICATION / INCORRECTLY LABELED SPECIMEN 10.5*  --   --   --   --  HGB QUESTIONABLE IDENTIFICATION / INCORRECTLY LABELED SPECIMEN 12.6* 11.1* 11.0* 11.4* 10.8*  HCT QUESTIONABLE IDENTIFICATION / INCORRECTLY LABELED SPECIMEN 39.6 35.0* 35.0* 35.3* 33.7*  MCV QUESTIONABLE IDENTIFICATION / INCORRECTLY LABELED SPECIMEN 86.8 86.2 86.0 85.1 85.1  PLT QUESTIONABLE IDENTIFICATION / INCORRECTLY LABELED SPECIMEN 123* 127* 124* 126* 149*   Basic Metabolic Panel: Recent Labs  Lab 08/20/23 2100 08/21/23 0640 08/22/23 0616 08/23/23 0529 08/24/23 0543 08/25/23 0601  NA 138 138 135 135  --  133*  K 4.0 3.7 3.5 3.5  --  3.7  CL 106 104 102 100  --  101  CO2 23 25 22 24   --  24  GLUCOSE 115* 76 79 79  --  82  BUN 20 16 10 8   --  11  CREATININE 0.75 0.59* 0.57* 0.57* 0.54* 0.56*  CALCIUM  9.0 9.2 9.1 9.2  --  9.2   GFR: Estimated Creatinine Clearance: 75.3 mL/min (A) (by C-G formula based on SCr of 0.56 mg/dL (L)). Liver Function Tests: Recent Labs  Lab 08/20/23 1542  08/20/23 2100  AST QUESTIONABLE IDENTIFICATION / INCORRECTLY LABELED SPECIMEN 13*  ALT QUESTIONABLE IDENTIFICATION / INCORRECTLY LABELED SPECIMEN 10  ALKPHOS QUESTIONABLE IDENTIFICATION / INCORRECTLY LABELED SPECIMEN 89  BILITOT 1.7* 1.1  PROT QUESTIONABLE IDENTIFICATION / INCORRECTLY LABELED SPECIMEN 7.0  ALBUMIN QUESTIONABLE IDENTIFICATION / INCORRECTLY LABELED SPECIMEN 2.9*   CBG: No results for input(s): GLUCAP in the last 168 hours.  Recent Results (from the past 240 hours)  Resp panel by RT-PCR (RSV, Flu A&B, Covid) Anterior Nasal Swab     Status: None   Collection Time: 08/20/23  5:19 PM   Specimen: Anterior Nasal Swab  Result Value Ref Range Status   SARS Coronavirus 2 by RT PCR NEGATIVE NEGATIVE Final    Comment: (NOTE) SARS-CoV-2 target nucleic acids are NOT DETECTED.  The SARS-CoV-2 RNA is generally detectable in upper respiratory specimens during the acute phase of infection. The lowest concentration of SARS-CoV-2 viral copies this assay can detect is 138 copies/mL. A negative result does not preclude SARS-Cov-2 infection and should not be used as the sole basis for treatment or other patient management decisions. A negative result may occur with  improper specimen collection/handling, submission of specimen other than nasopharyngeal swab, presence of viral mutation(s) within the areas targeted by this assay, and inadequate number of viral copies(<138 copies/mL). A negative result must be combined with clinical observations, patient history, and epidemiological information. The expected result is Negative.  Fact Sheet for Patients:  bloggercourse.com  Fact Sheet for Healthcare Providers:  seriousbroker.it  This test is no t yet approved or cleared by the [place]  FDA and  has been authorized for detection and/or diagnosis of SARS-CoV-2 by FDA under an Emergency Use Authorization (EUA). This EUA will remain   in effect (meaning this test can be used) for the duration of the COVID-19 declaration under Section 564(b)(1) of the Act, 21 U.S.C.section 360bbb-3(b)(1), unless the authorization is terminated  or revoked sooner.       Influenza A by PCR NEGATIVE NEGATIVE Final   Influenza B by PCR NEGATIVE NEGATIVE Final    Comment: (NOTE) The Xpert Xpress SARS-CoV-2/FLU/RSV plus assay is intended as an aid in the diagnosis of influenza from Nasopharyngeal swab specimens and should not be used as a sole basis for treatment. Nasal washings and aspirates are unacceptable for Xpert Xpress SARS-CoV-2/FLU/RSV testing.  Fact Sheet for Patients: bloggercourse.com  Fact Sheet for Healthcare Providers: seriousbroker.it  This test is not yet approved or cleared  by the [place]  FDA and has been authorized for detection and/or diagnosis of SARS-CoV-2 by FDA under an Emergency Use Authorization (EUA). This EUA will remain in effect (meaning this test can be used) for the duration of the COVID-19 declaration under Section 564(b)(1) of the Act, 21 U.S.C. section 360bbb-3(b)(1), unless the authorization is terminated or revoked.     Resp Syncytial Virus by PCR NEGATIVE NEGATIVE Final    Comment: (NOTE) Fact Sheet for Patients: bloggercourse.com  Fact Sheet for Healthcare Providers: seriousbroker.it  This test is not yet approved or cleared by the [place]  FDA and has been authorized for detection and/or diagnosis of SARS-CoV-2 by FDA under an Emergency Use Authorization (EUA). This EUA will remain in effect (meaning this test can be used) for the duration of the COVID-19 declaration under Section 564(b)(1) of the Act, 21 U.S.C. section 360bbb-3(b)(1), unless the authorization is terminated or revoked.  Performed at [facility], [address] [place], [place] [address]   Blood  culture (routine x 2)     Status: None   Collection Time: 08/20/23  6:47 PM   Specimen: BLOOD RIGHT ARM  Result Value Ref Range Status   Specimen Description BLOOD RIGHT ARM  Final   Special Requests   Final    AEROBIC BOTTLE ONLY Blood Culture results may not be optimal due to an inadequate volume of blood received in culture bottles   Culture   Final    NO GROWTH 5 DAYS Performed at [facility], [address] [place], [place] [address]    Report Status 08/25/2023 FINAL  Final  Blood culture (routine x 2)     Status: None   Collection Time: 08/20/23  6:47 PM   Specimen: BLOOD RIGHT HAND  Result Value Ref Range Status   Specimen Description BLOOD RIGHT HAND  Final   Special Requests   Final    AEROBIC BOTTLE ONLY Blood Culture results may not be optimal due to an inadequate volume of blood received in culture bottles   Culture   Final    NO GROWTH 5 DAYS Performed at [facility], [address], [place], [place] [address]    Report Status 08/25/2023 FINAL  Final  Urine Culture     Status: Abnormal   Collection Time: 08/20/23  9:19 PM   Specimen: Urine, Random  Result Value Ref Range Status   Specimen Description   Final    URINE, RANDOM Performed at [facility], [address] [place], [place] [address]    Special Requests   Final    NONE Reflexed from [id number] Performed at [facility], [address] [place], [place] [address]    Culture (A)  Final    <10,000 COLONIES/mL INSIGNIFICANT GROWTH Performed at [facility], [address], [place], [place] [address]    Report Status 08/21/2023 FINAL  Final  Aerobic/Anaerobic Culture w Gram Stain (surgical/deep wound)     Status: None (Preliminary result)   Collection Time: 08/22/23  9:00 AM   Specimen: Body Fluid  Result Value Ref Range Status   Specimen Description   Final    FLUID Performed at [facility], [address] [place], [place] [address]     Special Requests   Final    NONE Performed at [facility], [address] [place], [place] [address]    Gram Stain   Final    FEW WBC PRESENT, PREDOMINANTLY PMN NO ORGANISMS SEEN  Performed at [facility], [address] [place], [place] [address]    Culture   Final    RARE ESCHERICHIA COLI Confirmed Extended Spectrum Beta-Lactamase Producer (ESBL).  In bloodstream infections from ESBL organisms, carbapenems are preferred over piperacillin/tazobactam. They are shown to have a lower risk of mortality. NO ANAEROBES ISOLATED; CULTURE IN PROGRESS FOR 5 DAYS    Report Status PENDING  Incomplete   Organism ID, Bacteria ESCHERICHIA COLI  Final      Susceptibility   Escherichia coli - MIC*    AMPICILLIN >=32 RESISTANT Resistant     CEFEPIME  >=32 RESISTANT Resistant     CEFTAZIDIME RESISTANT Resistant     CEFTRIAXONE  >=64 RESISTANT Resistant     CIPROFLOXACIN >=4 RESISTANT Resistant     GENTAMICIN  <=1 SENSITIVE Sensitive     IMIPENEM <=0.25 SENSITIVE Sensitive     TRIMETH/SULFA <=20 SENSITIVE Sensitive     AMPICILLIN/SULBACTAM >=32 RESISTANT Resistant     PIP/TAZO <=4 SENSITIVE Sensitive ug/mL    * RARE ESCHERICHIA COLI     Radiology Studies: No results found.  Scheduled Meds:  amLODipine   5 mg Oral Daily   ammonium lactate    Topical BID   atorvastatin   40 mg Oral QPM   citalopram   40 mg Oral Daily   clopidogrel   75 mg Oral Daily   enoxaparin  (LOVENOX ) injection  40 mg Subcutaneous Q24H   ferrous sulfate   325 mg Oral Q breakfast   guaiFENesin   600 mg Oral BID   methimazole   5 mg Oral TID   mupirocin  cream   Topical Daily   risperiDONE   1 mg Oral QHS   tamsulosin   0.4 mg Oral QPC supper   Continuous Infusions:  meropenem  (MERREM ) IV 1 g (08/26/23 0539)     LOS: 6 days    Time spent:  55min  Ronald Werber, DO [facility]  To contact the attending physician between 7A-7P please use Epic Chat. To contact the covering physician during after  hours 7P-7A, please review Amion.   08/26/2023, 7:56 AM   *This document has been created with the assistance of dictation software. Please excuse typographical errors. *

## 2023-08-26 NOTE — TOC Progression Note (Signed)
 Transition of Care [facility]) - Progression Note    Patient Details  Name: [name] MRN: [id number] Date of Birth: [birth year]  [facility] CM/SW Contact  Ladene Lady, LCSW Phone Number: 08/26/2023, 12:08 PM  Clinical Narrative:   Waiting on PICC for patient. TOC attempted to reach timmy to discuss SNF with him as patient is only axo2.         Expected Discharge Plan and Services                                               Social Determinants of Health (SDOH) Interventions SDOH Screenings   Food Insecurity: Patient Unable To Answer (08/23/2023)  Housing: Patient Unable To Answer (08/23/2023)  Transportation Needs: Patient Unable To Answer (08/23/2023)  Utilities: Patient Unable To Answer (08/23/2023)  Depression (PHQ2-9): Low Risk  (12/12/2020)  Social Connections: Patient Unable To Answer (08/23/2023)  Tobacco Use: High Risk (08/20/2023)    Readmission Risk Interventions    05/09/2023    4:34 PM  Readmission Risk Prevention Plan  Transportation Screening Complete  PCP or Specialist Appt within 5-7 Days Complete  Home Care Screening Complete  Medication Review (RN CM) Complete

## 2023-08-27 DIAGNOSIS — M4626 Osteomyelitis of vertebra, lumbar region: Secondary | ICD-10-CM | POA: Diagnosis not present

## 2023-08-27 DIAGNOSIS — M4646 Discitis, unspecified, lumbar region: Secondary | ICD-10-CM | POA: Diagnosis not present

## 2023-08-27 DIAGNOSIS — B9620 Unspecified Escherichia coli [E. coli] as the cause of diseases classified elsewhere: Secondary | ICD-10-CM | POA: Diagnosis not present

## 2023-08-27 DIAGNOSIS — N2889 Other specified disorders of kidney and ureter: Secondary | ICD-10-CM | POA: Diagnosis not present

## 2023-08-27 DIAGNOSIS — Z1612 Extended spectrum beta lactamase (ESBL) resistance: Secondary | ICD-10-CM | POA: Diagnosis not present

## 2023-08-27 DIAGNOSIS — R6520 Severe sepsis without septic shock: Secondary | ICD-10-CM | POA: Diagnosis not present

## 2023-08-27 DIAGNOSIS — A419 Sepsis, unspecified organism: Secondary | ICD-10-CM | POA: Diagnosis not present

## 2023-08-27 LAB — AEROBIC/ANAEROBIC CULTURE W GRAM STAIN (SURGICAL/DEEP WOUND)

## 2023-08-27 NOTE — Progress Notes (Signed)
 Date of Admission:  08/20/2023      ID: [name] is a 73 y.o. 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, L1-2   Psoas abscess (HCC)   Aspiration pneumonia (HCC)   Bipolar disorder (HCC)   Dementia without behavioral disturbance (HCC)   AAA (abdominal aortic aneurysm) (HCC)   Aortic atherosclerosis (HCC)   Acute metabolic encephalopathy   PVD (peripheral vascular disease) (HCC)   Discitis of lumbar region   Fungal infection of toenail   Hypotension    Subjective: Pt states he is following okay   Medications:   amLODipine   5 mg Oral Daily   ammonium lactate    Topical BID   atorvastatin   40 mg Oral QPM   citalopram   40 mg Oral Daily   clopidogrel   75 mg Oral Daily   enoxaparin  (LOVENOX ) injection  40 mg Subcutaneous Q24H   ferrous sulfate   325 mg Oral Q breakfast   guaiFENesin   600 mg Oral BID   methimazole   5 mg Oral TID   mupirocin  cream   Topical Daily   risperiDONE   1 mg Oral QHS   tamsulosin   0.4 mg Oral QPC supper    Objective: Vital signs in last 24 hours: Patient Vitals for the past 24 hrs:  BP Temp Temp src Pulse Resp SpO2  08/27/23 0745 139/70 99 F (37.2 C) -- 83 16 97 %  08/27/23 0408 (!) 142/81 98.6 F (37 C) -- 89 18 98 %  08/26/23 2227 126/62 99.1 F (37.3 C) Oral 97 16 98 %  08/26/23 1734 125/84 98 F (36.7 C) -- 100 18 93 %       PHYSICAL EXAM:  General: Awake, alert, oriented to self Lungs: Bilateral air entry Heart: Irregular  abdomen: Soft, non-tender,not distended. Bowel sounds normal. No masses Extremities: atraumatic, no cyanosis. No edema. No clubbing Skin: No rashes or lesions. Or bruising Lymph: Cervical, supraclavicular normal. Neurologic: Grossly  non-focal Toenails have onychogryphosis.  On the right foot the third nail is broken and is bleeding Lab Results    Latest Ref Rng & Units 08/25/2023    6:01 AM 08/23/2023    5:29 AM 08/22/2023    6:16 AM  CBC  WBC 4.0 - 10.5 K/uL 5.3  5.3  6.6   Hemoglobin 13.0 - 17.0 g/dL 89.1  88.5  88.9   Hematocrit 39.0 - 52.0 % 33.7  35.3  35.0   Platelets 150 - 400 K/uL 149  126  124        Latest Ref Rng & Units 08/25/2023    6:01 AM 08/24/2023    5:43 AM 08/23/2023    5:29 AM  CMP  Glucose 70 - 99 mg/dL 82   79   BUN 8 - 23 mg/dL 11   8   Creatinine 9.38 - 1.24 mg/dL 9.43  9.45  9.42   Sodium 135 - 145 mmol/L 133   135   Potassium 3.5 - 5.1 mmol/L 3.7   3.5   Chloride 98 - 111 mmol/L 101   100   CO2 22 - 32 mmol/L 24   24   Calcium  8.9 - 10.3 mg/dL 9.2   9.2       Microbiology: Lumbar disc  space culture is ESBL E. coli    Assessment/Plan:  73 year old male coming from group home with altered mental status.  He has a history of schizophrenia, bipolar disorder, COPD and recent right hip fracture with ORIF which is healed well He has a history of ESBL E. coli bacteremia.  L1 and L2 discitis and osteomyelitis.  Status post aspiration of the disc space showed ESBL E. coli.  Patient is currently on meropenem    is going to need at least 6 weeks of antibiotics.  Once a day ertapenem  is an option on discharge=10/03/23   Right hip surgical site is healed well  Schizophrenia on risperidone   Hyperthyroidism on methimazole   Hypertension on amlodipine   Hyperlipidemia on atorvastatin   COPD on nebulizers  Right renal lower pole cystic lesion  Inferior abdominal aorta aneurysmal dilatation.  Discussed the management with the care team.

## 2023-08-27 NOTE — Consult Note (Signed)
 Urology Consult  I have been asked to see the patient by Dr. Dezii, for evaluation and management of right renal mass.  Chief Complaint: AMS  History of Present Illness: [name] is a 73 y.o. 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  Admission CT chest abdomen and pelvis with contrast was notable for an incidental right lower pole renal lesion.  He subsequently underwent MRI abdomen with and without contrast yesterday, which further characterize this as a 2.3 x 2.0 cm mixed solid and cystic lesion of the posterior inferior pole of the right kidney consistent with small RCC.  No evidence of lymphadenopathy or metastatic disease in the abdomen.  Admission UA appeared grossly positive with nitrites, 50 WBC/hpf, 11-20 RBCs/hpf, and many bacteria.  Urine culture finalized with insignificant growth.  Today he reports no pain or gross hematuria.  Past Medical History:  Diagnosis Date   Bipolar disorder (HCC)    COPD (chronic obstructive pulmonary disease) (HCC)    Depression    GERD (gastroesophageal reflux disease)    GIB (gastrointestinal bleeding)    Hyperlipidemia    Hypertension    Mild dementia (HCC)    Paranoid schizophrenia (HCC)    Smoker    Tobacco dependence     Past Surgical History:  Procedure Laterality Date   INTRAMEDULLARY (IM) NAIL INTERTROCHANTERIC Right 05/18/2023   Procedure: INTRAMEDULLARY (IM) NAIL INTERTROCHANTERIC;  Surgeon: Cleotilde Barrio, MD;  Location: [facility] ORS;  Service: Orthopedics;  Laterality: Right;    Home Medications:  Current Meds  Medication Sig   ascorbic acid  (VITAMIN C ) 500 MG tablet Take 1 tablet (500 mg total) by mouth 2 (two) times daily.   citalopram  (CELEXA ) 40 MG tablet Take 1 tablet (40 mg total) by mouth  daily.   ferrous sulfate  325 (65 FE) MG tablet Take 1 tablet (325 mg total) by mouth daily with breakfast.   fluPHENAZine  (PROLIXIN ) 5 MG tablet Take 5 mg by mouth daily.   methimazole  (TAPAZOLE ) 5 MG tablet Take 5 mg by mouth 3 (three) times daily.   metoprolol succinate (TOPROL-XL) 25 MG 24 hr tablet Take 25 mg by mouth daily.   Multiple Vitamin (MULTIVITAMIN WITH MINERALS) TABS tablet Take 1 tablet by mouth daily.   risperiDONE  (RISPERDAL ) 1 MG tablet Take 1 tablet (1 mg total) by mouth daily.   senna (SENOKOT) 8.6 MG TABS tablet Take 1 tablet (8.6 mg total) by mouth 2 (two) times daily. (Patient taking differently: Take 2 tablets by mouth at bedtime.)   simvastatin  (ZOCOR ) 20 MG tablet TAKE 1 TABLET BY MOUTH DAILY   tamsulosin  (FLOMAX ) 0.4 MG CAPS capsule TAKE 1 CAPSULE BY MOUTH DAILY AFTER SUPPER (Patient taking differently: Take 0.4 mg by mouth 2 (two) times daily.)   vitamin B-12 (CYANOCOBALAMIN ) 100 MCG tablet Take 100 mcg by mouth daily.    Allergies:  Allergies  Allergen Reactions   Asa [Aspirin]     Hx GI bleed    Family History  Family history unknown: Yes    Social History:  reports that he has been smoking cigarettes. He has never used smokeless tobacco. He reports that he does not drink alcohol and does not use drugs.  ROS: A complete review of systems was performed.  All systems are negative except for pertinent findings as noted.  Physical Exam:  Vital signs in last 24 hours: Temp:  [98 F (36.7 C)-99.1 F (37.3 C)] 99 F (37.2 C) (02/07 0745) Pulse Rate:  [83-100] 83 (02/07 0745) Resp:  [16-18] 16 (02/07 0745) BP: (125-142)/(62-84) 139/70 (02/07 0745) SpO2:  [93 %-98 %] 97 % (02/07 0745) Constitutional:  Alert and oriented, no acute distress HEENT: [place] AT, moist mucus membranes Cardiovascular: No clubbing, cyanosis, or edema Respiratory: Normal respiratory effort Skin: No rashes, bruises or suspicious lesions Neurologic: Grossly intact, no focal deficits,  moving all 4 extremities Psychiatric: Normal mood and affect  Laboratory Data:  Recent Labs    08/25/23 0601  WBC 5.3  HGB 10.8*  HCT 33.7*   Recent Labs    08/25/23 0601  NA 133*  K 3.7  CL 101  CO2 24  GLUCOSE 82  BUN 11  CREATININE 0.56*  CALCIUM  9.2   Urinalysis    Component Value Date/Time   COLORURINE YELLOW (A) 08/20/2023 2119   APPEARANCEUR HAZY (A) 08/20/2023 2119   APPEARANCEUR Clear 01/03/2013 0232   LABSPEC >1.046 (H) 08/20/2023 2119   LABSPEC 1.006 01/03/2013 0232   PHURINE 5.0 08/20/2023 2119   GLUCOSEU NEGATIVE 08/20/2023 2119   GLUCOSEU Negative 01/03/2013 0232   HGBUR MODERATE (A) 08/20/2023 2119   BILIRUBINUR NEGATIVE 08/20/2023 2119   BILIRUBINUR Negative 01/03/2013 0232   KETONESUR NEGATIVE 08/20/2023 2119   PROTEINUR 30 (A) 08/20/2023 2119   NITRITE POSITIVE (A) 08/20/2023 2119   LEUKOCYTESUR MODERATE (A) 08/20/2023 2119   LEUKOCYTESUR Negative 01/03/2013 0232   Results for orders placed or performed during the hospital encounter of 08/20/23  Resp panel by RT-PCR (RSV, Flu A&B, Covid) Anterior Nasal Swab     Status: None   Collection Time: 08/20/23  5:19 PM   Specimen: Anterior Nasal Swab  Result Value Ref Range Status   SARS Coronavirus 2 by RT PCR NEGATIVE NEGATIVE Final    Comment: (NOTE) SARS-CoV-2 target nucleic acids are NOT DETECTED.  The SARS-CoV-2 RNA is generally detectable in upper respiratory specimens during the acute phase of infection. The lowest concentration of SARS-CoV-2 viral copies this assay can detect is 138 copies/mL. A negative result does not preclude SARS-Cov-2 infection and should not be used as the sole basis for treatment or other patient management decisions. A negative result may occur with  improper specimen collection/handling, submission of specimen other than nasopharyngeal swab, presence of viral mutation(s) within the areas targeted by this assay, and inadequate number of viral copies(<138  copies/mL). A negative result must be combined with clinical observations, patient history, and epidemiological information. The expected result is Negative.  Fact Sheet for Patients:  bloggercourse.com  Fact Sheet for Healthcare Providers:  seriousbroker.it  This test is no t yet approved or cleared by the [place]  FDA and  has been authorized for detection and/or diagnosis of SARS-CoV-2 by FDA under an Emergency Use Authorization (EUA). This EUA will remain  in effect (meaning this test can be used) for the duration of the COVID-19 declaration under Section 564(b)(1) of the Act, 21 U.S.C.section 360bbb-3(b)(1), unless the authorization is terminated  or revoked sooner.       Influenza A by PCR NEGATIVE NEGATIVE Final   Influenza B by PCR NEGATIVE NEGATIVE Final    Comment: (NOTE) The Xpert Xpress SARS-CoV-2/FLU/RSV plus assay is intended as an aid in the diagnosis of influenza from Nasopharyngeal swab specimens and should not be used as a sole basis for treatment. Nasal washings and aspirates are unacceptable for  Xpert Xpress SARS-CoV-2/FLU/RSV testing.  Fact Sheet for Patients: bloggercourse.com  Fact Sheet for Healthcare Providers: seriousbroker.it  This test is not yet approved or cleared by the [place]  FDA and has been authorized for detection and/or diagnosis of SARS-CoV-2 by FDA under an Emergency Use Authorization (EUA). This EUA will remain in effect (meaning this test can be used) for the duration of the COVID-19 declaration under Section 564(b)(1) of the Act, 21 U.S.C. section 360bbb-3(b)(1), unless the authorization is terminated or revoked.     Resp Syncytial Virus by PCR NEGATIVE NEGATIVE Final    Comment: (NOTE) Fact Sheet for Patients: bloggercourse.com  Fact Sheet for Healthcare  Providers: seriousbroker.it  This test is not yet approved or cleared by the [place]  FDA and has been authorized for detection and/or diagnosis of SARS-CoV-2 by FDA under an Emergency Use Authorization (EUA). This EUA will remain in effect (meaning this test can be used) for the duration of the COVID-19 declaration under Section 564(b)(1) of the Act, 21 U.S.C. section 360bbb-3(b)(1), unless the authorization is terminated or revoked.  Performed at [facility], [address] [place], [place] [address]   Blood culture (routine x 2)     Status: None   Collection Time: 08/20/23  6:47 PM   Specimen: BLOOD RIGHT ARM  Result Value Ref Range Status   Specimen Description BLOOD RIGHT ARM  Final   Special Requests   Final    AEROBIC BOTTLE ONLY Blood Culture results may not be optimal due to an inadequate volume of blood received in culture bottles   Culture   Final    NO GROWTH 5 DAYS Performed at [facility], [address] [place], [place] [address]    Report Status 08/25/2023 FINAL  Final  Blood culture (routine x 2)     Status: None   Collection Time: 08/20/23  6:47 PM   Specimen: BLOOD RIGHT HAND  Result Value Ref Range Status   Specimen Description BLOOD RIGHT HAND  Final   Special Requests   Final    AEROBIC BOTTLE ONLY Blood Culture results may not be optimal due to an inadequate volume of blood received in culture bottles   Culture   Final    NO GROWTH 5 DAYS Performed at [facility], [address], [place], [place] [address]    Report Status 08/25/2023 FINAL  Final  Urine Culture     Status: Abnormal   Collection Time: 08/20/23  9:19 PM   Specimen: Urine, Random  Result Value Ref Range Status   Specimen Description   Final    URINE, RANDOM Performed at [facility], [address], [place], [place] [address]    Special Requests   Final    NONE Reflexed from [id number] Performed at [facility], [address] [place], [place] [address]    Culture (A)  Final    <10,000 COLONIES/mL INSIGNIFICANT GROWTH Performed at [facility], [address] [place], [place] [address]    Report Status 08/21/2023 FINAL  Final  Aerobic/Anaerobic Culture w Gram Stain (surgical/deep wound)     Status: None (Preliminary result)   Collection Time: 08/22/23  9:00 AM   Specimen: Body Fluid  Result Value Ref Range Status   Specimen Description   Final    FLUID Performed at [facility], [address], [place], [place] [address]    Special Requests   Final    NONE Performed at [facility],  [address] [place], [place] [address]    Gram Stain   Final    FEW WBC PRESENT, PREDOMINANTLY PMN NO ORGANISMS SEEN Performed at [facility], [address] [place], [place] [address]    Culture   Final    RARE ESCHERICHIA COLI Confirmed Extended Spectrum Beta-Lactamase Producer (ESBL).  In bloodstream infections from ESBL organisms, carbapenems are preferred over piperacillin/tazobactam. They are shown to have a lower risk of mortality. NO ANAEROBES ISOLATED; CULTURE IN PROGRESS FOR 5 DAYS    Report Status PENDING  Incomplete   Organism ID, Bacteria ESCHERICHIA COLI  Final      Susceptibility   Escherichia coli - MIC*    AMPICILLIN >=32 RESISTANT Resistant     CEFEPIME  >=32 RESISTANT Resistant     CEFTAZIDIME RESISTANT Resistant     CEFTRIAXONE  >=64 RESISTANT Resistant     CIPROFLOXACIN >=4 RESISTANT Resistant     GENTAMICIN  <=1 SENSITIVE Sensitive     IMIPENEM <=0.25 SENSITIVE Sensitive     TRIMETH/SULFA <=20 SENSITIVE Sensitive     AMPICILLIN/SULBACTAM >=32 RESISTANT Resistant     PIP/TAZO <=4 SENSITIVE Sensitive ug/mL    * RARE ESCHERICHIA COLI   Radiologic Imaging: MR ABDOMEN W WO CONTRAST Result Date: 08/26/2023 CLINICAL DATA:  Right renal lesion concerning for malignancy, characterize EXAM: MRI ABDOMEN WITHOUT AND WITH CONTRAST  TECHNIQUE: Multiplanar multisequence MR imaging of the abdomen was performed both before and after the administration of intravenous contrast. CONTRAST:  6mL GADAVIST  GADOBUTROL  1 MMOL/ML IV SOLN COMPARISON:  CT chest abdomen pelvis, 08/20/2023 FINDINGS: Examination is very limited by pervasive breath motion artifact throughout, particularly multiphasic contrast enhanced sequences. Within this limitation, Lower chest: No acute abnormality. Hepatobiliary: No solid liver abnormality is seen. No gallstones, gallbladder wall thickening, or biliary dilatation. Pancreas: Unremarkable. No pancreatic ductal dilatation or surrounding inflammatory changes. Spleen: Normal in size without significant abnormality. Adrenals/Urinary Tract: Adrenal glands are unremarkable. Evaluation of the kidneys is significantly limited breath motion artifact as above. Within this limitation, heterogeneously enhancing mixed solid and cystic lesion of the posterior inferior pole of the right kidney measuring 2.3 x 2.0 cm (series 20, image 42) additional simple, benign nonenhancing renal cortical cysts, for which no further follow-up or characterization is required. Stomach/Bowel: Stomach is within normal limits. No evidence of bowel wall thickening, distention, or inflammatory changes. Vascular/Lymphatic: Aortic atherosclerosis. No enlarged abdominal lymph nodes. Other: No abdominal wall hernia or abnormality. No ascites. Musculoskeletal: Rim enhancement and endplate destruction of the L1-L2 disc space (series 22, image 27). IMPRESSION: 1. Examination is very limited by pervasive breath motion artifact throughout, particularly multiphasic contrast enhanced sequences. 2. Within this limitation, heterogeneously enhancing mixed solid and cystic lesion of the posterior inferior pole of the right kidney measuring 2.3 x 2.0 cm, consistent with a small renal cell carcinoma. 3. No evidence of lymphadenopathy or metastatic disease in the abdomen. 4. Rim  enhancement and endplate destruction of the L1-L2 disc space, consistent with discitis/osteomyelitis, better assessed by prior dedicated imaging of the lumbar spine. Electronically Signed   By: Marolyn JONETTA Jaksch M.D.   On: 08/26/2023 21:03   Assessment & Plan:  73 year old comorbid male with an incidental small right renal mass concerning for RCC.  Very small renal mass with no evidence of significant bleeding, though he did have microscopic hematuria on UA.  Admission urine culture with insignificant growth.  No indication for urgent urologic intervention at this time.  We will see him outpatient to discuss management of his  renal mass, though given size and comorbidities anticipate surveillance would be a reasonable option.  Thank you for involving me in this patient's care, please page with any further questions or concerns.  Atiya Yera, PA-C 08/27/2023 10:57 AM

## 2023-08-27 NOTE — TOC Progression Note (Signed)
 Transition of Care [facility]) - Progression Note    Patient Details  Name: [name] MRN: [id number] Date of Birth: 09-30-1950  Transition of Care [facility]) CM/SW Contact  Ladene Lady, LCSW Phone Number: 08/27/2023, 10:42 AM  Clinical Narrative:    Passr still pending.        Expected Discharge Plan and Services                                               Social Determinants of Health (SDOH) Interventions SDOH Screenings   Food Insecurity: Patient Unable To Answer (08/23/2023)  Housing: Patient Unable To Answer (08/23/2023)  Transportation Needs: Patient Unable To Answer (08/23/2023)  Utilities: Patient Unable To Answer (08/23/2023)  Depression (PHQ2-9): Low Risk  (12/12/2020)  Social Connections: Patient Unable To Answer (08/23/2023)  Tobacco Use: High Risk (08/20/2023)    Readmission Risk Interventions    05/09/2023    4:34 PM  Readmission Risk Prevention Plan  Transportation Screening Complete  PCP or Specialist Appt within 5-7 Days Complete  Home Care Screening Complete  Medication Review (RN CM) Complete

## 2023-08-27 NOTE — Plan of Care (Signed)
  Problem: Fluid Volume: Goal: Hemodynamic stability will improve Outcome: Progressing   Problem: Clinical Measurements: Goal: Diagnostic test results will improve Outcome: Progressing Goal: Signs and symptoms of infection will decrease Outcome: Progressing   Problem: Respiratory: Goal: Ability to maintain adequate ventilation will improve Outcome: Progressing   Problem: Clinical Measurements: Goal: Ability to maintain clinical measurements within normal limits will improve Outcome: Progressing Goal: Will remain free from infection Outcome: Progressing Goal: Diagnostic test results will improve Outcome: Progressing Goal: Respiratory complications will improve Outcome: Progressing Goal: Cardiovascular complication will be avoided Outcome: Progressing   Problem: Activity: Goal: Risk for activity intolerance will decrease Outcome: Progressing   Problem: Nutrition: Goal: Adequate nutrition will be maintained Outcome: Progressing   Problem: Coping: Goal: Level of anxiety will decrease Outcome: Progressing   Problem: Elimination: Goal: Will not experience complications related to bowel motility Outcome: Progressing Goal: Will not experience complications related to urinary retention Outcome: Progressing   Problem: Pain Managment: Goal: General experience of comfort will improve and/or be controlled Outcome: Progressing   Problem: Safety: Goal: Ability to remain free from injury will improve Outcome: Progressing   Problem: Skin Integrity: Goal: Risk for impaired skin integrity will decrease Outcome: Progressing

## 2023-08-27 NOTE — Progress Notes (Signed)
 PROGRESS NOTE    [name]  FMW:[id number] DOB: Nov 14, 1950 DOA: 08/20/2023 PCP: Caretha Barter, NP  Chief Complaint  Patient presents with   Altered Mental Status    Hospital Course:  [name] is 73 y.o. male with dementia, bipolar disorder, hypertension, COPD, diastolic congestive heart failure, CVA, hypothyroidism, ESBL UTI history, and history of right hip fracture with local cellulitis in November 2024.  He presented to the ER septic.  CT scan revealed cystic lesion in the right kidney, discitis with possible osteomyelitis at L1-L2, significant stenosis in the iliac vessels with occlusion of the right SFA and 3.2 cm abdominal aortic aneurysm.  MRI then confirmed osteomyelitis and discitis at L1-L2 with an abscess in the right psoas muscle.  Interventional radiology was consulted and underwent drainage of the abscess on 2/2.  Infectious disease was consulted.  ESBL E. coli is growing out of the discitis culture.  He is currently on meropenem .  Subjective: No acute events overnight.  No Acute complaints.   Objective: Vitals:   08/26/23 1734 08/26/23 2227 08/27/23 0408 08/27/23 0745  BP: 125/84 126/62 (!) 142/81 139/70  Pulse: 100 97 89 83  Resp: 18 16 18 16   Temp: 98 F (36.7 C) 99.1 F (37.3 C) 98.6 F (37 C) 99 F (37.2 C)  TempSrc:  Oral    SpO2: 93% 98% 98% 97%  Weight:      Height:        Intake/Output Summary (Last 24 hours) at 08/27/2023 1006 Last data filed at 08/26/2023 2238 Gross per 24 hour  Intake 800 ml  Output --  Net 800 ml    Filed Weights   08/21/23 1326  Weight: 68.9 kg    Examination: General exam: Appears calm and comfortable, NAD  Respiratory system: No work of breathing, symmetric chest wall expansion Cardiovascular system: S1 & S2 heard, RRR.  Gastrointestinal system: Abdomen is nondistended, soft and nontender.  Neuro: alert, does not answer questions consistently, requires significant prompting. Extremities: Symmetric, expected  ROM Skin: No rashes, lesions Psychiatry: Mood & affect appropriate for situation.   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, L1-2   Psoas abscess (HCC)   Chronic diastolic CHF (congestive heart failure) (HCC)   Aspiration pneumonia (HCC)   Acute metabolic encephalopathy   Hypertension   Thrombocytopenia (HCC)   Aortic atherosclerosis (HCC)   Hyperthyroidism   AAA (abdominal aortic aneurysm) (HCC)   COPD (chronic obstructive pulmonary disease) (HCC)   Bipolar disorder (HCC)   PVD (peripheral vascular disease) (HCC)   Urinary tract infection   Dementia without behavioral disturbance (HCC)   Discitis of lumbar region   Fungal infection of toenail   Hypotension    Severe sepsis - Criteria met on arrival with: Hypotension, tachycardia, leukocytosis, acute metabolic encephalopathy, lactic acidosis, discitis and osteomyelitis source - Antibiotics as below  Discitis and osteomyelitis of lumbar region, L1-2 - Infectious disease consulted - Status post discitis culture with IR 2/2 - Culture growing ESBL E. coli - On Merrem  now, vancomycin  has been discontinued.  Will need once a day ertapenem  for 6 weeks at discharge  Right psoas abscess - Interventional radiology unable to get culture of this area - Antibiotics as above  History of right hip repair November/2024 - Complicated by right hip cellulitis at that time - Appears stable at this time  Aspiration pneumonia - Currently on Merrem  and vancomycin  - On dysphagia  2 diet  Chronic diastolic heart failure - Euvolemic, hold diuretics for now  Acute metabolic encephalopathy - Secondary to sepsis - Appears at baseline now  Hypertension - Continue Norvasc   Thrombocytopenia - Secondary to sepsis - Trend CBC  Hyperthyroidism - Continue methimazole   Abdominal aortic aneurysm Aortic atherosclerosis - Continue  statin  Peripheral vascular disease - Needs vascular follow-up outpatient - Continue Plavix  and statin for now - Had aspirin allergy, noted  Bipolar disorder Schizophrenia Dementia without behavioral disturbance - Continue home meds Risperdal  and Celexa   COPD - Not currently in exacerbation - As needed DuoNebs  Urinary tract infection, ruled out.  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 Urology consulted, recommends outpatient follow-up  Physical deconditioning PT score: 10 OT score: 16 - Needs SNF at discharge.  TOC consulted  DVT prophylaxis Lovenox     Code Status: Full Code Family Communication: Discussed directly with patient Disposition:  Status is: Inpatient, patient is medically cleared 2/5. Pending SNF arrangements.  Consultants:    Treatment Team:  Consulting Physician: Fayette Bodily, MD Consulting Physician: Lennie Barter, DPM  Procedures:    Antimicrobials:  Anti-infectives (From admission, onward)    Start     Dose/Rate Route Frequency Ordered Stop   08/23/23 2130  meropenem  (MERREM ) 1 g in sodium chloride  0.9 % 100 mL IVPB        1 g 200 mL/hr over 30 Minutes Intravenous Every 8 hours 08/23/23 2037     08/22/23 1800  vancomycin  (VANCOREADY) IVPB 1750 mg/350 mL  Status:  Discontinued        1,750 mg 175 mL/hr over 120 Minutes Intravenous Every 24 hours 08/22/23 1006 08/24/23 1530   08/21/23 0700  vancomycin  (VANCOCIN ) IVPB 1000 mg/200 mL premix  Status:  Discontinued        1,000 mg 200 mL/hr over 60 Minutes Intravenous Every 12 hours 08/21/23 0400 08/22/23 1006   08/21/23 0415  vancomycin  (VANCOCIN ) 750 mg in sodium chloride  0.9 % 250 mL IVPB  Status:  Discontinued        750  mg 250 mL/hr over 60 Minutes Intravenous  Once 08/21/23 0402 08/21/23 0642   08/21/23 0400  vancomycin  (VANCOREADY) IVPB 750 mg/150 mL  Status:  Discontinued        750 mg 150 mL/hr over 60 Minutes Intravenous  Once 08/21/23 0352 08/21/23 0800   08/21/23 0345  ceFEPIme  (MAXIPIME ) 2 g in sodium chloride  0.9 % 100 mL IVPB  Status:  Discontinued        2 g 200 mL/hr over 30 Minutes Intravenous Every 8 hours 08/21/23 0340 08/23/23 2011   08/20/23 1645  ceFEPIme  (MAXIPIME ) 2 g in sodium chloride  0.9 % 100 mL IVPB        2 g 200 mL/hr over 30 Minutes Intravenous  Once 08/20/23 1641 08/20/23 1844   08/20/23 1645  vancomycin  (VANCOCIN ) IVPB 1000 mg/200 mL premix        1,000 mg 200 mL/hr over 60 Minutes Intravenous  Once 08/20/23 1641 08/20/23 2157       Data Reviewed: I have personally reviewed following labs and imaging studies CBC: Recent Labs  Lab 08/20/23 1542 08/20/23 1847 08/21/23 0640 08/22/23 0616 08/23/23 0529 08/25/23 0601  WBC QUESTIONABLE IDENTIFICATION / INCORRECTLY LABELED SPECIMEN  11.3* 6.6 6.6 5.3 5.3  NEUTROABS QUESTIONABLE IDENTIFICATION / INCORRECTLY LABELED SPECIMEN 10.5*  --   --   --   --   HGB QUESTIONABLE IDENTIFICATION / INCORRECTLY LABELED SPECIMEN 12.6* 11.1* 11.0* 11.4* 10.8*  HCT QUESTIONABLE IDENTIFICATION / INCORRECTLY LABELED SPECIMEN 39.6 35.0* 35.0* 35.3* 33.7*  MCV QUESTIONABLE IDENTIFICATION / INCORRECTLY LABELED SPECIMEN 86.8 86.2 86.0 85.1 85.1  PLT QUESTIONABLE IDENTIFICATION / INCORRECTLY LABELED SPECIMEN 123* 127* 124* 126* 149*   Basic Metabolic Panel: Recent Labs  Lab 08/20/23 2100 08/21/23 0640 08/22/23 0616 08/23/23 0529 08/24/23 0543 08/25/23 0601  NA 138 138 135 135  --  133*  K 4.0 3.7 3.5 3.5  --  3.7  CL 106 104 102 100  --  101  CO2 23 25 22 24   --  24  GLUCOSE 115* 76 79 79  --  82  BUN 20 16 10 8   --  11  CREATININE 0.75 0.59* 0.57* 0.57* 0.54* 0.56*  CALCIUM  9.0 9.2 9.1 9.2  --  9.2   GFR: Estimated Creatinine  Clearance: 75.3 mL/min (A) (by C-G formula based on SCr of 0.56 mg/dL (L)). Liver Function Tests: Recent Labs  Lab 08/20/23 1542 08/20/23 2100  AST QUESTIONABLE IDENTIFICATION / INCORRECTLY LABELED SPECIMEN 13*  ALT QUESTIONABLE IDENTIFICATION / INCORRECTLY LABELED SPECIMEN 10  ALKPHOS QUESTIONABLE IDENTIFICATION / INCORRECTLY LABELED SPECIMEN 89  BILITOT 1.7* 1.1  PROT QUESTIONABLE IDENTIFICATION / INCORRECTLY LABELED SPECIMEN 7.0  ALBUMIN QUESTIONABLE IDENTIFICATION / INCORRECTLY LABELED SPECIMEN 2.9*   CBG: No results for input(s): GLUCAP in the last 168 hours.  Recent Results (from the past 240 hours)  Resp panel by RT-PCR (RSV, Flu A&B, Covid) Anterior Nasal Swab     Status: None   Collection Time: 08/20/23  5:19 PM   Specimen: Anterior Nasal Swab  Result Value Ref Range Status   SARS Coronavirus 2 by RT PCR NEGATIVE NEGATIVE Final    Comment: (NOTE) SARS-CoV-2 target nucleic acids are NOT DETECTED.  The SARS-CoV-2 RNA is generally detectable in upper respiratory specimens during the acute phase of infection. The lowest concentration of SARS-CoV-2 viral copies this assay can detect is 138 copies/mL. A negative result does not preclude SARS-Cov-2 infection and should not be used as the sole basis for treatment or other patient management decisions. A negative result may occur with  improper specimen collection/handling, submission of specimen other than nasopharyngeal swab, presence of viral mutation(s) within the areas targeted by this assay, and inadequate number of viral copies(<138 copies/mL). A negative result must be combined with clinical observations, patient history, and epidemiological information. The expected result is Negative.  Fact Sheet for Patients:  bloggercourse.com  Fact Sheet for Healthcare Providers:  seriousbroker.it  This test is no t yet approved or cleared by the [place]  FDA and  has  been authorized for detection and/or diagnosis of SARS-CoV-2 by FDA under an Emergency Use Authorization (EUA). This EUA will remain  in effect (meaning this test can be used) for the duration of the COVID-19 declaration under Section 564(b)(1) of the Act, 21 U.S.C.section 360bbb-3(b)(1), unless the authorization is terminated  or revoked sooner.       Influenza A by PCR NEGATIVE NEGATIVE Final   Influenza B by PCR NEGATIVE NEGATIVE Final    Comment: (NOTE) The Xpert Xpress SARS-CoV-2/FLU/RSV plus assay is intended as an aid in the diagnosis of influenza from Nasopharyngeal swab specimens and should not be used as a sole basis for treatment. Nasal washings and aspirates are unacceptable  for Xpert Xpress SARS-CoV-2/FLU/RSV testing.  Fact Sheet for Patients: bloggercourse.com  Fact Sheet for Healthcare Providers: seriousbroker.it  This test is not yet approved or cleared by the [place]  FDA and has been authorized for detection and/or diagnosis of SARS-CoV-2 by FDA under an Emergency Use Authorization (EUA). This EUA will remain in effect (meaning this test can be used) for the duration of the COVID-19 declaration under Section 564(b)(1) of the Act, 21 U.S.C. section 360bbb-3(b)(1), unless the authorization is terminated or revoked.     Resp Syncytial Virus by PCR NEGATIVE NEGATIVE Final    Comment: (NOTE) Fact Sheet for Patients: bloggercourse.com  Fact Sheet for Healthcare Providers: seriousbroker.it  This test is not yet approved or cleared by the [place]  FDA and has been authorized for detection and/or diagnosis of SARS-CoV-2 by FDA under an Emergency Use Authorization (EUA). This EUA will remain in effect (meaning this test can be used) for the duration of the COVID-19 declaration under Section 564(b)(1) of the Act, 21 U.S.C. section 360bbb-3(b)(1), unless the  authorization is terminated or revoked.  Performed at [facility], [address] [place], [place] [address]   Blood culture (routine x 2)     Status: None   Collection Time: 08/20/23  6:47 PM   Specimen: BLOOD RIGHT ARM  Result Value Ref Range Status   Specimen Description BLOOD RIGHT ARM  Final   Special Requests   Final    AEROBIC BOTTLE ONLY Blood Culture results may not be optimal due to an inadequate volume of blood received in culture bottles   Culture   Final    NO GROWTH 5 DAYS Performed at [facility], [address] [place], [place] [address]    Report Status 08/25/2023 FINAL  Final  Blood culture (routine x 2)     Status: None   Collection Time: 08/20/23  6:47 PM   Specimen: BLOOD RIGHT HAND  Result Value Ref Range Status   Specimen Description BLOOD RIGHT HAND  Final   Special Requests   Final    AEROBIC BOTTLE ONLY Blood Culture results may not be optimal due to an inadequate volume of blood received in culture bottles   Culture   Final    NO GROWTH 5 DAYS Performed at [facility], [address], [place], [place] [address]    Report Status 08/25/2023 FINAL  Final  Urine Culture     Status: Abnormal   Collection Time: 08/20/23  9:19 PM   Specimen: Urine, Random  Result Value Ref Range Status   Specimen Description   Final    URINE, RANDOM Performed at [facility], [address], [place], [place] [address]    Special Requests   Final    NONE Reflexed from [phone] Performed at [facility], [address] [place], [place] [address]    Culture (A)  Final    <10,000 COLONIES/mL INSIGNIFICANT GROWTH Performed at [facility], [address] [place], [place] [address]    Report Status 08/21/2023 FINAL  Final  Aerobic/Anaerobic Culture w Gram Stain (surgical/deep wound)     Status: None (Preliminary result)   Collection Time: 08/22/23  9:00 AM   Specimen: Body Fluid  Result Value Ref Range Status    Specimen Description   Final    FLUID Performed at [facility], [address], [place], [place] [address]    Special Requests   Final    NONE Performed at [facility], [address] [place], [place] [address]    Gram Stain   Final    FEW WBC PRESENT, PREDOMINANTLY PMN NO ORGANISMS SEEN Performed at [facility], [address], [place], [place] [address]    Culture   Final    RARE ESCHERICHIA COLI Confirmed Extended Spectrum Beta-Lactamase Producer (ESBL).  In bloodstream infections from ESBL organisms, carbapenems are preferred over piperacillin/tazobactam. They are shown to have a lower risk of mortality. NO ANAEROBES ISOLATED; CULTURE IN PROGRESS FOR 5 DAYS    Report Status PENDING  Incomplete   Organism ID, Bacteria ESCHERICHIA COLI  Final      Susceptibility   Escherichia coli - MIC*    AMPICILLIN >=32 RESISTANT Resistant     CEFEPIME  >=32 RESISTANT Resistant     CEFTAZIDIME RESISTANT Resistant     CEFTRIAXONE  >=64 RESISTANT Resistant     CIPROFLOXACIN >=4 RESISTANT Resistant     GENTAMICIN  <=1 SENSITIVE Sensitive     IMIPENEM <=0.25 SENSITIVE Sensitive     TRIMETH/SULFA <=20 SENSITIVE Sensitive     AMPICILLIN/SULBACTAM >=32 RESISTANT Resistant     PIP/TAZO <=4 SENSITIVE Sensitive ug/mL    * RARE ESCHERICHIA COLI     Radiology Studies: MR ABDOMEN W WO CONTRAST Result Date: 08/26/2023 CLINICAL DATA:  Right renal lesion concerning for malignancy, characterize EXAM: MRI ABDOMEN WITHOUT AND WITH CONTRAST TECHNIQUE: Multiplanar multisequence MR imaging of the abdomen was performed both before and after the administration of intravenous contrast. CONTRAST:  6mL GADAVIST  GADOBUTROL  1 MMOL/ML IV SOLN COMPARISON:  CT chest abdomen pelvis, 08/20/2023 FINDINGS: Examination is very limited by pervasive breath motion artifact throughout, particularly multiphasic contrast enhanced sequences. Within this limitation, Lower chest: No acute abnormality.  Hepatobiliary: No solid liver abnormality is seen. No gallstones, gallbladder wall thickening, or biliary dilatation. Pancreas: Unremarkable. No pancreatic ductal dilatation or surrounding inflammatory changes. Spleen: Normal in size without significant abnormality. Adrenals/Urinary Tract: Adrenal glands are unremarkable. Evaluation of the kidneys is significantly limited breath motion artifact as above. Within this limitation, heterogeneously enhancing mixed solid and cystic lesion of the posterior inferior pole of the right kidney measuring 2.3 x 2.0 cm (series 20, image 42) additional simple, benign nonenhancing renal cortical cysts, for which no further follow-up or characterization is required. Stomach/Bowel: Stomach is within normal limits. No evidence of bowel wall thickening, distention, or inflammatory changes. Vascular/Lymphatic: Aortic atherosclerosis. No enlarged abdominal lymph nodes. Other: No abdominal wall hernia or abnormality. No ascites. Musculoskeletal: Rim enhancement and endplate destruction of the L1-L2 disc space (series 22, image 27). IMPRESSION: 1. Examination is very limited by pervasive breath motion artifact throughout, particularly multiphasic contrast enhanced sequences. 2. Within this limitation, heterogeneously enhancing mixed solid and cystic lesion of the posterior inferior pole of the right kidney measuring 2.3 x 2.0 cm, consistent with a small renal cell carcinoma. 3. No evidence of lymphadenopathy or metastatic disease in the abdomen. 4. Rim enhancement and endplate destruction of the L1-L2 disc space, consistent with discitis/osteomyelitis, better assessed by prior dedicated imaging of the lumbar spine. Electronically Signed   By: Marolyn JONETTA Jaksch M.D.   On: 08/26/2023 21:03    Scheduled Meds:  amLODipine   5 mg Oral Daily   ammonium lactate    Topical BID   atorvastatin   40 mg Oral QPM   citalopram   40 mg Oral Daily   clopidogrel   75 mg Oral Daily   enoxaparin  (LOVENOX )  injection  40 mg Subcutaneous Q24H   ferrous sulfate   325 mg Oral Q  breakfast   guaiFENesin   600 mg Oral BID   methimazole   5 mg Oral TID   mupirocin  cream   Topical Daily   risperiDONE   1 mg Oral QHS   tamsulosin   0.4 mg Oral QPC supper   Continuous Infusions:  meropenem  (MERREM ) IV 1 g (08/27/23 0601)     LOS: 7 days    Time spent:  55min  Arthella Headings, DO [facility]  To contact the attending physician between 7A-7P please use Epic Chat. To contact the covering physician during after hours 7P-7A, please review Amion.   08/27/2023, 10:06 AM   *This document has been created with the assistance of dictation software. Please excuse typographical errors. *

## 2023-08-27 NOTE — Plan of Care (Addendum)

## 2023-08-28 DIAGNOSIS — A498 Other bacterial infections of unspecified site: Secondary | ICD-10-CM | POA: Diagnosis not present

## 2023-08-28 DIAGNOSIS — M4646 Discitis, unspecified, lumbar region: Secondary | ICD-10-CM | POA: Diagnosis not present

## 2023-08-28 DIAGNOSIS — Z1612 Extended spectrum beta lactamase (ESBL) resistance: Secondary | ICD-10-CM | POA: Diagnosis not present

## 2023-08-28 NOTE — Progress Notes (Signed)
 PROGRESS NOTE    [name]  FMW:[id number] DOB: [birth year] DOA: 08/20/2023 PCP: Caretha Barter, NP  Chief Complaint  Patient presents with   Altered Mental Status    Hospital Course:  [name] is 73 y.o. male with dementia, bipolar disorder, hypertension, COPD, diastolic congestive heart failure, CVA, hypothyroidism, ESBL UTI history, and history of right hip fracture with local cellulitis in November 2024.  He presented to the ER septic.  CT scan revealed cystic lesion in the right kidney, discitis with possible osteomyelitis at L1-L2, significant stenosis in the iliac vessels with occlusion of the right SFA and 3.2 cm abdominal aortic aneurysm.  MRI then confirmed osteomyelitis and discitis at L1-L2 with an abscess in the right psoas muscle.  Interventional radiology was consulted and underwent drainage of the abscess on 2/2.  Infectious disease was consulted.  ESBL E. coli is growing out of the discitis culture.  He is currently on meropenem .  Subjective: No acute events overnight.  No Acute complaints.   Objective: Vitals:   08/27/23 1622 08/27/23 2005 08/28/23 0431 08/28/23 0758  BP: 119/75 (!) 147/81 134/72 130/77  Pulse: 97 89 (!) 107 (!) 105  Resp: 16 17  18   Temp: 98.5 F (36.9 C) 98.5 F (36.9 C) 98.6 F (37 C) 98.4 F (36.9 C)  TempSrc:      SpO2: 94% 98% 95% 100%  Weight:      Height:       No intake or output data in the 24 hours ending 08/28/23 0908   Filed Weights   08/21/23 1326  Weight: 68.9 kg    Examination: General exam: Appears calm and comfortable, NAD  Respiratory system: No work of breathing, symmetric chest wall expansion Cardiovascular system: S1 & S2 heard, RRR.  Gastrointestinal system: Abdomen is nondistended, soft and nontender.  Neuro: alert, does not answer questions consistently, requires significant prompting. Extremities: Symmetric, expected ROM Skin: No rashes, lesions Psychiatry: Mood & affect appropriate for situation.    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, L1-2   Psoas abscess (HCC)   Chronic diastolic CHF (congestive heart failure) (HCC)   Aspiration pneumonia (HCC)   Acute metabolic encephalopathy   Hypertension   Thrombocytopenia (HCC)   Aortic atherosclerosis (HCC)   Hyperthyroidism   AAA (abdominal aortic aneurysm) (HCC)   COPD (chronic obstructive pulmonary disease) (HCC)   Bipolar disorder (HCC)   PVD (peripheral vascular disease) (HCC)   Urinary tract infection   Dementia without behavioral disturbance (HCC)   Discitis of lumbar region   Fungal infection of toenail   Hypotension    Severe sepsis - Criteria met on arrival with: Hypotension, tachycardia, leukocytosis, acute metabolic encephalopathy, lactic acidosis, discitis and osteomyelitis - Antibiotics as below  Discitis and osteomyelitis of lumbar region, L1-2 - Infectious disease consulted - Status post discitis culture with IR 2/2 - Culture growing ESBL E. coli - On Merrem  now, vancomycin  has been discontinued.  Will need once a day ertapenem  for 6 weeks at discharge (end date 10/03/23)  Right psoas abscess - Interventional radiology unable to get culture of this area - Antibiotics as above  History of right hip repair November/2024 - Complicated by right hip cellulitis at that time - Appears stable at this time  Aspiration pneumonia - Currently on Merrem  and vancomycin  - On dysphagia 2 diet  Chronic diastolic heart failure - Euvolemic, hold diuretics for now  Acute  metabolic encephalopathy - Secondary to sepsis - Appears at baseline now  Hypertension - Continue Norvasc   Thrombocytopenia - Secondary to sepsis - Trend CBC  Hyperthyroidism - Continue methimazole   Abdominal aortic aneurysm Aortic atherosclerosis - Continue statin  Peripheral vascular disease - Needs vascular follow-up outpatient - Continue  Plavix  and statin for now - Has aspirin allergy, noted  Bipolar disorder Schizophrenia Dementia without behavioral disturbance - Continue home meds Risperdal  and Celexa   COPD - Not currently in exacerbation - As needed DuoNebs  Urinary tract infection, ruled out.  Urine culture negative  Fungal infection of toenail Toenail overgrowth - Podiatry completed bedside debridement 2/4  Normocytic anemia - Stable, trend  Right Renal Lower Pole Cystic Lesion -Incidentally seen on CT, 2.4 x 2.4, neoplasm is possible - MRI w/wo: Concerning for renal cell carcinoma. Urology consulted, recommends outpatient follow-up  Physical deconditioning PT score: 10 OT score: 16 - Needs SNF at discharge.  TOC consulted  DVT prophylaxis Lovenox     Code Status: Full Code Family Communication: Discussed directly with patient Disposition:  Status is: Inpatient, patient is medically cleared 2/5. Pending SNF arrangements.  Consultants:    Treatment Team:  Consulting Physician: Fayette Bodily, MD Consulting Physician: Lennie Barter, DPM Consulting Physician: Alvaro Ricardo KATHEE Mickey., MD  Procedures:    Antimicrobials:  Anti-infectives (From admission, onward)    Start     Dose/Rate Route Frequency Ordered Stop   08/23/23 2130  meropenem  (MERREM ) 1 g in sodium chloride  0.9 % 100 mL IVPB        1 g 200 mL/hr over 30 Minutes Intravenous Every 8 hours 08/23/23 2037     08/22/23 1800  vancomycin  (VANCOREADY) IVPB 1750 mg/350 mL  Status:  Discontinued        1,750 mg 175 mL/hr over 120 Minutes Intravenous Every 24 hours 08/22/23 1006 08/24/23 1530   08/21/23 0700  vancomycin  (VANCOCIN ) IVPB 1000 mg/200 mL premix  Status:  Discontinued        1,000 mg 200 mL/hr over 60 Minutes Intravenous Every 12 hours 08/21/23 0400 08/22/23 1006   08/21/23 0415  vancomycin  (VANCOCIN ) 750 mg in sodium chloride  0.9 % 250 mL IVPB  Status:  Discontinued        750 mg 250 mL/hr over 60 Minutes Intravenous  Once  08/21/23 0402 08/21/23 0642   08/21/23 0400  vancomycin  (VANCOREADY) IVPB 750 mg/150 mL  Status:  Discontinued        750 mg 150 mL/hr over 60 Minutes Intravenous  Once 08/21/23 0352 08/21/23 0800   08/21/23 0345  ceFEPIme  (MAXIPIME ) 2 g in sodium chloride  0.9 % 100 mL IVPB  Status:  Discontinued        2 g 200 mL/hr over 30 Minutes Intravenous Every 8 hours 08/21/23 0340 08/23/23 2011   08/20/23 1645  ceFEPIme  (MAXIPIME ) 2 g in sodium chloride  0.9 % 100 mL IVPB        2 g 200 mL/hr over 30 Minutes Intravenous  Once 08/20/23 1641 08/20/23 1844   08/20/23 1645  vancomycin  (VANCOCIN ) IVPB 1000 mg/200 mL premix        1,000 mg 200 mL/hr over 60 Minutes Intravenous  Once 08/20/23 1641 08/20/23 2157       Data Reviewed: I have personally reviewed following labs and imaging studies CBC: Recent Labs  Lab 08/22/23 0616 08/23/23 0529 08/25/23 0601  WBC 6.6 5.3 5.3  HGB 11.0* 11.4* 10.8*  HCT 35.0* 35.3* 33.7*  MCV 86.0 85.1 85.1  PLT 124*  126* 149*   Basic Metabolic Panel: Recent Labs  Lab 08/22/23 0616 08/23/23 0529 08/24/23 0543 08/25/23 0601  NA 135 135  --  133*  K 3.5 3.5  --  3.7  CL 102 100  --  101  CO2 22 24  --  24  GLUCOSE 79 79  --  82  BUN 10 8  --  11  CREATININE 0.57* 0.57* 0.54* 0.56*  CALCIUM  9.1 9.2  --  9.2   GFR: Estimated Creatinine Clearance: 75.3 mL/min (A) (by C-G formula based on SCr of 0.56 mg/dL (L)). Liver Function Tests: No results for input(s): AST, ALT, ALKPHOS, BILITOT, PROT, ALBUMIN in the last 168 hours.  CBG: No results for input(s): GLUCAP in the last 168 hours.  Recent Results (from the past 240 hours)  Resp panel by RT-PCR (RSV, Flu A&B, Covid) Anterior Nasal Swab     Status: None   Collection Time: 08/20/23  5:19 PM   Specimen: Anterior Nasal Swab  Result Value Ref Range Status   SARS Coronavirus 2 by RT PCR NEGATIVE NEGATIVE Final    Comment: (NOTE) SARS-CoV-2 target nucleic acids are NOT DETECTED.  The  SARS-CoV-2 RNA is generally detectable in upper respiratory specimens during the acute phase of infection. The lowest concentration of SARS-CoV-2 viral copies this assay can detect is 138 copies/mL. A negative result does not preclude SARS-Cov-2 infection and should not be used as the sole basis for treatment or other patient management decisions. A negative result may occur with  improper specimen collection/handling, submission of specimen other than nasopharyngeal swab, presence of viral mutation(s) within the areas targeted by this assay, and inadequate number of viral copies(<138 copies/mL). A negative result must be combined with clinical observations, patient history, and epidemiological information. The expected result is Negative.  Fact Sheet for Patients:  bloggercourse.com  Fact Sheet for Healthcare Providers:  seriousbroker.it  This test is no t yet approved or cleared by the [place]  FDA and  has been authorized for detection and/or diagnosis of SARS-CoV-2 by FDA under an Emergency Use Authorization (EUA). This EUA will remain  in effect (meaning this test can be used) for the duration of the COVID-19 declaration under Section 564(b)(1) of the Act, 21 U.S.C.section 360bbb-3(b)(1), unless the authorization is terminated  or revoked sooner.       Influenza A by PCR NEGATIVE NEGATIVE Final   Influenza B by PCR NEGATIVE NEGATIVE Final    Comment: (NOTE) The Xpert Xpress SARS-CoV-2/FLU/RSV plus assay is intended as an aid in the diagnosis of influenza from Nasopharyngeal swab specimens and should not be used as a sole basis for treatment. Nasal washings and aspirates are unacceptable for Xpert Xpress SARS-CoV-2/FLU/RSV testing.  Fact Sheet for Patients: bloggercourse.com  Fact Sheet for Healthcare Providers: seriousbroker.it  This test is not yet approved or  cleared by the [place]  FDA and has been authorized for detection and/or diagnosis of SARS-CoV-2 by FDA under an Emergency Use Authorization (EUA). This EUA will remain in effect (meaning this test can be used) for the duration of the COVID-19 declaration under Section 564(b)(1) of the Act, 21 U.S.C. section 360bbb-3(b)(1), unless the authorization is terminated or revoked.     Resp Syncytial Virus by PCR NEGATIVE NEGATIVE Final    Comment: (NOTE) Fact Sheet for Patients: bloggercourse.com  Fact Sheet for Healthcare Providers: seriousbroker.it  This test is not yet approved or cleared by the [place]  FDA and has been authorized for detection and/or diagnosis of SARS-CoV-2  by FDA under an Emergency Use Authorization (EUA). This EUA will remain in effect (meaning this test can be used) for the duration of the COVID-19 declaration under Section 564(b)(1) of the Act, 21 U.S.C. section 360bbb-3(b)(1), unless the authorization is terminated or revoked.  Performed at [facility], [address] [place], [place] [address]   Blood culture (routine x 2)     Status: None   Collection Time: 08/20/23  6:47 PM   Specimen: BLOOD RIGHT ARM  Result Value Ref Range Status   Specimen Description BLOOD RIGHT ARM  Final   Special Requests   Final    AEROBIC BOTTLE ONLY Blood Culture results may not be optimal due to an inadequate volume of blood received in culture bottles   Culture   Final    NO GROWTH 5 DAYS Performed at [facility], [address] [place], [place] [address]    Report Status 08/25/2023 FINAL  Final  Blood culture (routine x 2)     Status: None   Collection Time: 08/20/23  6:47 PM   Specimen: BLOOD RIGHT HAND  Result Value Ref Range Status   Specimen Description BLOOD RIGHT HAND  Final   Special Requests   Final    AEROBIC BOTTLE ONLY Blood Culture results may not be optimal due to an  inadequate volume of blood received in culture bottles   Culture   Final    NO GROWTH 5 DAYS Performed at [facility], [address] [place], [place] [address]    Report Status 08/25/2023 FINAL  Final  Urine Culture     Status: Abnormal   Collection Time: 08/20/23  9:19 PM   Specimen: Urine, Random  Result Value Ref Range Status   Specimen Description   Final    URINE, RANDOM Performed at [facility], [address], [place], [place] [address]    Special Requests   Final    NONE Reflexed from [id number] Performed at [facility], [address] [place], [place] [address]    Culture (A)  Final    <10,000 COLONIES/mL INSIGNIFICANT GROWTH Performed at [facility], [address] [place], [place] [address]    Report Status 08/21/2023 FINAL  Final  Aerobic/Anaerobic Culture w Gram Stain (surgical/deep wound)     Status: None   Collection Time: 08/22/23  9:00 AM   Specimen: Body Fluid  Result Value Ref Range Status   Specimen Description   Final    FLUID Performed at [facility], [address] [place], [place] [address]    Special Requests   Final    NONE Performed at [facility], [address] [place], [place] [address]    Gram Stain   Final    FEW WBC PRESENT, PREDOMINANTLY PMN NO ORGANISMS SEEN    Culture   Final    RARE ESCHERICHIA COLI Confirmed Extended Spectrum Beta-Lactamase Producer (ESBL).  In bloodstream infections from ESBL organisms, carbapenems are preferred over piperacillin/tazobactam. They are shown to have a lower risk of mortality. NO ANAEROBES ISOLATED Performed at [facility], [address], [place], [place] [address]    Report Status 08/27/2023 FINAL  Final   Organism ID, Bacteria ESCHERICHIA COLI  Final      Susceptibility   Escherichia coli - MIC*    AMPICILLIN >=32 RESISTANT Resistant     CEFEPIME  >=32 RESISTANT Resistant     CEFTAZIDIME RESISTANT Resistant     CEFTRIAXONE   >=  64 RESISTANT Resistant     CIPROFLOXACIN >=4 RESISTANT Resistant     GENTAMICIN  <=1 SENSITIVE Sensitive     IMIPENEM <=0.25 SENSITIVE Sensitive     TRIMETH/SULFA <=20 SENSITIVE Sensitive     AMPICILLIN/SULBACTAM >=32 RESISTANT Resistant     PIP/TAZO <=4 SENSITIVE Sensitive ug/mL    * RARE ESCHERICHIA COLI     Radiology Studies: MR ABDOMEN W WO CONTRAST Result Date: 08/26/2023 CLINICAL DATA:  Right renal lesion concerning for malignancy, characterize EXAM: MRI ABDOMEN WITHOUT AND WITH CONTRAST TECHNIQUE: Multiplanar multisequence MR imaging of the abdomen was performed both before and after the administration of intravenous contrast. CONTRAST:  6mL GADAVIST  GADOBUTROL  1 MMOL/ML IV SOLN COMPARISON:  CT chest abdomen pelvis, 08/20/2023 FINDINGS: Examination is very limited by pervasive breath motion artifact throughout, particularly multiphasic contrast enhanced sequences. Within this limitation, Lower chest: No acute abnormality. Hepatobiliary: No solid liver abnormality is seen. No gallstones, gallbladder wall thickening, or biliary dilatation. Pancreas: Unremarkable. No pancreatic ductal dilatation or surrounding inflammatory changes. Spleen: Normal in size without significant abnormality. Adrenals/Urinary Tract: Adrenal glands are unremarkable. Evaluation of the kidneys is significantly limited breath motion artifact as above. Within this limitation, heterogeneously enhancing mixed solid and cystic lesion of the posterior inferior pole of the right kidney measuring 2.3 x 2.0 cm (series 20, image 42) additional simple, benign nonenhancing renal cortical cysts, for which no further follow-up or characterization is required. Stomach/Bowel: Stomach is within normal limits. No evidence of bowel wall thickening, distention, or inflammatory changes. Vascular/Lymphatic: Aortic atherosclerosis. No enlarged abdominal lymph nodes. Other: No abdominal wall hernia or abnormality. No ascites. Musculoskeletal: Rim  enhancement and endplate destruction of the L1-L2 disc space (series 22, image 27). IMPRESSION: 1. Examination is very limited by pervasive breath motion artifact throughout, particularly multiphasic contrast enhanced sequences. 2. Within this limitation, heterogeneously enhancing mixed solid and cystic lesion of the posterior inferior pole of the right kidney measuring 2.3 x 2.0 cm, consistent with a small renal cell carcinoma. 3. No evidence of lymphadenopathy or metastatic disease in the abdomen. 4. Rim enhancement and endplate destruction of the L1-L2 disc space, consistent with discitis/osteomyelitis, better assessed by prior dedicated imaging of the lumbar spine. Electronically Signed   By: Marolyn JONETTA Jaksch M.D.   On: 08/26/2023 21:03    Scheduled Meds:  amLODipine   5 mg Oral Daily   ammonium lactate    Topical BID   atorvastatin   40 mg Oral QPM   citalopram   40 mg Oral Daily   clopidogrel   75 mg Oral Daily   enoxaparin  (LOVENOX ) injection  40 mg Subcutaneous Q24H   ferrous sulfate   325 mg Oral Q breakfast   guaiFENesin   600 mg Oral BID   methimazole   5 mg Oral TID   mupirocin  cream   Topical Daily   risperiDONE   1 mg Oral QHS   tamsulosin   0.4 mg Oral QPC supper   Continuous Infusions:  meropenem  (MERREM ) IV 1 g (08/28/23 0532)     LOS: 8 days    Time spent:  55min  Ronald Oser, DO [facility]  To contact the attending physician between 7A-7P please use Epic Chat. To contact the covering physician during after hours 7P-7A, please review Amion.   08/28/2023, 9:08 AM   *This document has been created with the assistance of dictation software. Please excuse typographical errors. *

## 2023-08-28 NOTE — Plan of Care (Signed)
  Problem: Clinical Measurements: Goal: Signs and symptoms of infection will decrease Outcome: Progressing   Problem: Respiratory: Goal: Ability to maintain adequate ventilation will improve Outcome: Progressing   Problem: Education: Goal: Knowledge of General Education information will improve Description: Including pain rating scale, medication(s)/side effects and non-pharmacologic comfort measures Outcome: Progressing   Problem: Clinical Measurements: Goal: Ability to maintain clinical measurements within normal limits will improve Outcome: Progressing Goal: Will remain free from infection Outcome: Progressing Goal: Diagnostic test results will improve Outcome: Progressing Goal: Respiratory complications will improve Outcome: Progressing Goal: Cardiovascular complication will be avoided Outcome: Progressing   Problem: Activity: Goal: Risk for activity intolerance will decrease Outcome: Progressing   Problem: Nutrition: Goal: Adequate nutrition will be maintained Outcome: Progressing   Problem: Coping: Goal: Level of anxiety will decrease Outcome: Progressing   Problem: Elimination: Goal: Will not experience complications related to bowel motility Outcome: Progressing   Problem: Pain Managment: Goal: General experience of comfort will improve and/or be controlled Outcome: Progressing   Problem: Safety: Goal: Ability to remain free from injury will improve Outcome: Progressing   Problem: Skin Integrity: Goal: Risk for impaired skin integrity will decrease Outcome: Progressing

## 2023-08-28 NOTE — Plan of Care (Signed)
  Problem: Clinical Measurements: Goal: Diagnostic test results will improve Outcome: Progressing   Problem: Education: Goal: Knowledge of General Education information will improve Description: Including pain rating scale, medication(s)/side effects and non-pharmacologic comfort measures Outcome: Progressing   Problem: Clinical Measurements: Goal: Ability to maintain clinical measurements within normal limits will improve Outcome: Progressing Goal: Will remain free from infection Outcome: Progressing Goal: Diagnostic test results will improve Outcome: Progressing

## 2023-08-28 NOTE — Progress Notes (Signed)
 Occupational Therapy Treatment Patient Details Name: [name] MRN: [id number] DOB: October 27, 1950 Today's Date: 08/28/2023   History of present illness 73 year old man past medical history of dementia, bipolar disorder, hypertension, COPD, diastolic congestive heart failure, stroke, hypothyroidism, ESBL UTI and history of right hip fracture with local cellulitis in November 2024.  In the ER he was hypotensive and tachycardic with an elevated white count and lactic acid .   OT comments  Pt lethargic this date, requires repeated verbal and tactile cueing for repositioning in bed. Pt requires Max A for opening food containers, ongoing encouragement to self-feed. Pt is minimally verbal. He is able to participate, with increased time, in seated UE therex and appears to enjoy this engagement.       If plan is discharge home, recommend the following:  A lot of help with bathing/dressing/bathroom;A lot of help with walking and/or transfers   Equipment Recommendations       Recommendations for Other Services      Precautions / Restrictions Precautions Precautions: Fall Restrictions Weight Bearing Restrictions Per Provider Order: No       Mobility Bed Mobility Overal bed mobility: Needs Assistance Bed Mobility: Supine to Sit     Supine to sit: Mod assist          Transfers                         Balance Overall balance assessment: Needs assistance Sitting-balance support: Feet supported, Bilateral upper extremity supported Sitting balance-Leahy Scale: Fair                                     ADL either performed or assessed with clinical judgement   ADL Overall ADL's : Needs assistance/impaired Eating/Feeding: Maximal assistance Eating/Feeding Details (indicate cue type and reason): Max A for opening food containers Grooming: Wash/dry hands;Wash/dry face;Sitting;Maximal assistance                                       Extremity/Trunk Assessment Upper Extremity Assessment Upper Extremity Assessment: Overall WFL for tasks assessed   Lower Extremity Assessment Lower Extremity Assessment: Generalized weakness        Vision       Perception     Praxis      Cognition Arousal: Lethargic Behavior During Therapy: Flat affect Overall Cognitive Status: No family/caregiver present to determine baseline cognitive functioning                                 General Comments: minimally verbal throughout session        Exercises Other Exercises Other Exercises: UE therex in sitting    Shoulder Instructions       General Comments      Pertinent Vitals/ Pain       Pain Assessment Pain Assessment: No/denies pain  Home Living                                          Prior Functioning/Environment              Frequency  Min 1X/week        Progress Toward  Goals  OT Goals(current goals can now be found in the care plan section)  Progress towards OT goals: Progressing toward goals  Acute Rehab OT Goals OT Goal Formulation: With patient Time For Goal Achievement: 09/06/23 Potential to Achieve Goals: Good  Plan      Co-evaluation                 AM-PAC OT 6 Clicks Daily Activity     Outcome Measure   Help from another person eating meals?: A Lot Help from another person taking care of personal grooming?: A Lot Help from another person toileting, which includes using toliet, bedpan, or urinal?: A Lot Help from another person bathing (including washing, rinsing, drying)?: A Lot Help from another person to put on and taking off regular upper body clothing?: A Little Help from another person to put on and taking off regular lower body clothing?: A Lot 6 Click Score: 13    End of Session    OT Visit Diagnosis: Muscle weakness (generalized) (M62.81);Other symptoms and signs involving cognitive function   Activity Tolerance Patient  limited by lethargy   Patient Left in bed;with call bell/phone within reach;with bed alarm set   Nurse Communication          Time: [id number] OT Time Calculation (min): 10 min  Charges: OT General Charges $OT Visit: 1 Visit OT Treatments $Self Care/Home Management : 8-22 mins  Suzen Hock, PhD, MS, OTR/L 08/28/23, 10:32 AM

## 2023-08-29 DIAGNOSIS — R6520 Severe sepsis without septic shock: Secondary | ICD-10-CM | POA: Diagnosis not present

## 2023-08-29 DIAGNOSIS — A419 Sepsis, unspecified organism: Secondary | ICD-10-CM | POA: Diagnosis not present

## 2023-08-29 NOTE — Progress Notes (Signed)
 Resting in bed with eyes closed. No needs identified.

## 2023-08-29 NOTE — Plan of Care (Signed)

## 2023-08-29 NOTE — Plan of Care (Signed)
  Problem: Clinical Measurements: Goal: Ability to maintain clinical measurements within normal limits will improve Outcome: Progressing   Problem: Elimination: Goal: Will not experience complications related to bowel motility Outcome: Progressing   Problem: Safety: Goal: Ability to remain free from injury will improve Outcome: Progressing   

## 2023-08-29 NOTE — Progress Notes (Signed)
 PROGRESS NOTE    [name]  FMW:[id number] DOB: 1951-06-28 DOA: 08/20/2023 PCP: Caretha Barter, NP  Chief Complaint  Patient presents with   Altered Mental Status    Hospital Course:  [name] is 73 y.o. male with dementia, bipolar disorder, hypertension, COPD, diastolic congestive heart failure, CVA, hypothyroidism, ESBL UTI history, and history of right hip fracture with local cellulitis in November 2024.  He presented to the ER septic.  CT scan revealed cystic lesion in the right kidney, discitis with possible osteomyelitis at L1-L2, significant stenosis in the iliac vessels with occlusion of the right SFA and 3.2 cm abdominal aortic aneurysm.  MRI then confirmed osteomyelitis and discitis at L1-L2 with an abscess in the right psoas muscle.  Interventional radiology was consulted and underwent drainage of the abscess on 2/2.  Infectious disease was consulted.  ESBL E. coli is growing out of the discitis culture.  He is currently on meropenem .  Subjective: No acute events overnight.  No Acute complaints.   Objective: Vitals:   08/28/23 0758 08/28/23 1539 08/28/23 2038 08/29/23 0514  BP: 130/77 102/65 122/61 96/76  Pulse: (!) 105 98 99 96  Resp: 18 17 18 18   Temp: 98.4 F (36.9 C) 98.4 F (36.9 C) 98.2 F (36.8 C) 98 F (36.7 C)  TempSrc:      SpO2: 100% 99% 100% 96%  Weight:      Height:        Intake/Output Summary (Last 24 hours) at 08/29/2023 0737 Last data filed at 08/28/2023 1843 Gross per 24 hour  Intake 340 ml  Output 200 ml  Net 140 ml     Filed Weights   08/21/23 1326  Weight: 68.9 kg    Examination: General exam: Appears calm and comfortable, NAD  Respiratory system: No work of breathing, symmetric chest wall expansion Cardiovascular system: S1 & S2 heard, RRR.  Gastrointestinal system: Abdomen is nondistended, soft and nontender.  Neuro: drowsy but arousable, oriented only to self, does not answer questions consistently, requires significant  prompting. Extremities: Symmetric, expected ROM Skin: No rashes, lesions Psychiatry: Mood & affect appropriate for situation.   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, L1-2   Psoas abscess (HCC)   Chronic diastolic CHF (congestive heart failure) (HCC)   Aspiration pneumonia (HCC)   Acute metabolic encephalopathy   Hypertension   Thrombocytopenia (HCC)   Aortic atherosclerosis (HCC)   Hyperthyroidism   AAA (abdominal aortic aneurysm) (HCC)   COPD (chronic obstructive pulmonary disease) (HCC)   Bipolar disorder (HCC)   PVD (peripheral vascular disease) (HCC)   Urinary tract infection   Dementia without behavioral disturbance (HCC)   Discitis of lumbar region   Fungal infection of toenail   Hypotension    Severe sepsis - Criteria met on arrival with: Hypotension, tachycardia, leukocytosis, acute metabolic encephalopathy, lactic acidosis, discitis and osteomyelitis - Antibiotics as below  Discitis and osteomyelitis of lumbar region, L1-2 - Infectious disease consulted - Status post discitis culture with IR 2/2 - Culture growing ESBL E. coli - On Merrem  now, vancomycin  has been discontinued.  Will need once a day ertapenem  for 6 weeks at discharge (end date 10/03/23)  Right psoas abscess - Interventional radiology unable to get culture of this area - Antibiotics as above  History of right hip repair November/2024 - Complicated by right hip cellulitis at that time - Appears stable at this time  Aspiration  pneumonia - Currently on Merrem  and vancomycin  - On dysphagia 2 diet  Chronic diastolic heart failure - Euvolemic, hold diuretics for now  Acute metabolic encephalopathy - Secondary to sepsis - Appears at baseline now  Hypertension - Continue Norvasc   Thrombocytopenia - Secondary to sepsis - Trend CBC  Hyperthyroidism - Continue methimazole   Abdominal aortic  aneurysm Aortic atherosclerosis - Continue statin  Peripheral vascular disease - Needs vascular follow-up outpatient - Continue Plavix  and statin for now - Has aspirin allergy, noted  Bipolar disorder Schizophrenia Dementia without behavioral disturbance - Continue home meds Risperdal  and Celexa   COPD - Not currently in exacerbation - As needed DuoNebs  Urinary tract infection, ruled out.  Urine culture negative  Fungal infection of toenail Toenail overgrowth - Podiatry completed bedside debridement 2/4  Normocytic anemia - Stable, trend  Right Renal Lower Pole Cystic Lesion -Incidentally seen on CT, 2.4 x 2.4, neoplasm is possible - MRI w/wo: Concerning for renal cell carcinoma. Urology consulted, recommends outpatient follow-up  Physical deconditioning PT score: 10 OT score: 16 - Needs SNF at discharge.  TOC consulted  DVT prophylaxis Lovenox     Code Status: Full Code Family Communication: Discussed directly with patient Disposition:  Status is: Inpatient, patient is medically cleared 2/5. Pending SNF arrangements.  Consultants:    Treatment Team:  Consulting Physician: Fayette Bodily, MD Consulting Physician: Lennie Barter, DPM Consulting Physician: Alvaro Ricardo KATHEE Mickey., MD  Procedures:    Antimicrobials:  Anti-infectives (From admission, onward)    Start     Dose/Rate Route Frequency Ordered Stop   08/23/23 2130  meropenem  (MERREM ) 1 g in sodium chloride  0.9 % 100 mL IVPB        1 g 200 mL/hr over 30 Minutes Intravenous Every 8 hours 08/23/23 2037     08/22/23 1800  vancomycin  (VANCOREADY) IVPB 1750 mg/350 mL  Status:  Discontinued        1,750 mg 175 mL/hr over 120 Minutes Intravenous Every 24 hours 08/22/23 1006 08/24/23 1530   08/21/23 0700  vancomycin  (VANCOCIN ) IVPB 1000 mg/200 mL premix  Status:  Discontinued        1,000 mg 200 mL/hr over 60 Minutes Intravenous Every 12 hours 08/21/23 0400 08/22/23 1006   08/21/23 0415  vancomycin   (VANCOCIN ) 750 mg in sodium chloride  0.9 % 250 mL IVPB  Status:  Discontinued        750 mg 250 mL/hr over 60 Minutes Intravenous  Once 08/21/23 0402 08/21/23 0642   08/21/23 0400  vancomycin  (VANCOREADY) IVPB 750 mg/150 mL  Status:  Discontinued        750 mg 150 mL/hr over 60 Minutes Intravenous  Once 08/21/23 0352 08/21/23 0800   08/21/23 0345  ceFEPIme  (MAXIPIME ) 2 g in sodium chloride  0.9 % 100 mL IVPB  Status:  Discontinued        2 g 200 mL/hr over 30 Minutes Intravenous Every 8 hours 08/21/23 0340 08/23/23 2011   08/20/23 1645  ceFEPIme  (MAXIPIME ) 2 g in sodium chloride  0.9 % 100 mL IVPB        2 g 200 mL/hr over 30 Minutes Intravenous  Once 08/20/23 1641 08/20/23 1844   08/20/23 1645  vancomycin  (VANCOCIN ) IVPB 1000 mg/200 mL premix        1,000 mg 200 mL/hr over 60 Minutes Intravenous  Once 08/20/23 1641 08/20/23 2157       Data Reviewed: I have personally reviewed following labs and imaging studies CBC: Recent Labs  Lab 08/23/23 0529 08/25/23 0601  WBC 5.3 5.3  HGB 11.4* 10.8*  HCT 35.3* 33.7*  MCV 85.1 85.1  PLT 126* 149*   Basic Metabolic Panel: Recent Labs  Lab 08/23/23 0529 08/24/23 0543 08/25/23 0601  NA 135  --  133*  K 3.5  --  3.7  CL 100  --  101  CO2 24  --  24  GLUCOSE 79  --  82  BUN 8  --  11  CREATININE 0.57* 0.54* 0.56*  CALCIUM  9.2  --  9.2   GFR: Estimated Creatinine Clearance: 75.3 mL/min (A) (by C-G formula based on SCr of 0.56 mg/dL (L)). Liver Function Tests: No results for input(s): AST, ALT, ALKPHOS, BILITOT, PROT, ALBUMIN in the last 168 hours.  CBG: No results for input(s): GLUCAP in the last 168 hours.  Recent Results (from the past 240 hours)  Resp panel by RT-PCR (RSV, Flu A&B, Covid) Anterior Nasal Swab     Status: None   Collection Time: 08/20/23  5:19 PM   Specimen: Anterior Nasal Swab  Result Value Ref Range Status   SARS Coronavirus 2 by RT PCR NEGATIVE NEGATIVE Final    Comment: (NOTE) SARS-CoV-2  target nucleic acids are NOT DETECTED.  The SARS-CoV-2 RNA is generally detectable in upper respiratory specimens during the acute phase of infection. The lowest concentration of SARS-CoV-2 viral copies this assay can detect is 138 copies/mL. A negative result does not preclude SARS-Cov-2 infection and should not be used as the sole basis for treatment or other patient management decisions. A negative result may occur with  improper specimen collection/handling, submission of specimen other than nasopharyngeal swab, presence of viral mutation(s) within the areas targeted by this assay, and inadequate number of viral copies(<138 copies/mL). A negative result must be combined with clinical observations, patient history, and epidemiological information. The expected result is Negative.  Fact Sheet for Patients:  bloggercourse.com  Fact Sheet for Healthcare Providers:  seriousbroker.it  This test is no t yet approved or cleared by the [place]  FDA and  has been authorized for detection and/or diagnosis of SARS-CoV-2 by FDA under an Emergency Use Authorization (EUA). This EUA will remain  in effect (meaning this test can be used) for the duration of the COVID-19 declaration under Section 564(b)(1) of the Act, 21 U.S.C.section 360bbb-3(b)(1), unless the authorization is terminated  or revoked sooner.       Influenza A by PCR NEGATIVE NEGATIVE Final   Influenza B by PCR NEGATIVE NEGATIVE Final    Comment: (NOTE) The Xpert Xpress SARS-CoV-2/FLU/RSV plus assay is intended as an aid in the diagnosis of influenza from Nasopharyngeal swab specimens and should not be used as a sole basis for treatment. Nasal washings and aspirates are unacceptable for Xpert Xpress SARS-CoV-2/FLU/RSV testing.  Fact Sheet for Patients: bloggercourse.com  Fact Sheet for Healthcare  Providers: seriousbroker.it  This test is not yet approved or cleared by the [place]  FDA and has been authorized for detection and/or diagnosis of SARS-CoV-2 by FDA under an Emergency Use Authorization (EUA). This EUA will remain in effect (meaning this test can be used) for the duration of the COVID-19 declaration under Section 564(b)(1) of the Act, 21 U.S.C. section 360bbb-3(b)(1), unless the authorization is terminated or revoked.     Resp Syncytial Virus by PCR NEGATIVE NEGATIVE Final    Comment: (NOTE) Fact Sheet for Patients: bloggercourse.com  Fact Sheet for Healthcare Providers: seriousbroker.it  This test is not yet approved or cleared by the [place]  FDA and has been  authorized for detection and/or diagnosis of SARS-CoV-2 by FDA under an Emergency Use Authorization (EUA). This EUA will remain in effect (meaning this test can be used) for the duration of the COVID-19 declaration under Section 564(b)(1) of the Act, 21 U.S.C. section 360bbb-3(b)(1), unless the authorization is terminated or revoked.  Performed at [facility], [address] [place], [place] [address]   Blood culture (routine x 2)     Status: None   Collection Time: 08/20/23  6:47 PM   Specimen: BLOOD RIGHT ARM  Result Value Ref Range Status   Specimen Description BLOOD RIGHT ARM  Final   Special Requests   Final    AEROBIC BOTTLE ONLY Blood Culture results may not be optimal due to an inadequate volume of blood received in culture bottles   Culture   Final    NO GROWTH 5 DAYS Performed at [facility], [address] [place], [place] [address]    Report Status 08/25/2023 FINAL  Final  Blood culture (routine x 2)     Status: None   Collection Time: 08/20/23  6:47 PM   Specimen: BLOOD RIGHT HAND  Result Value Ref Range Status   Specimen Description BLOOD RIGHT HAND  Final   Special Requests    Final    AEROBIC BOTTLE ONLY Blood Culture results may not be optimal due to an inadequate volume of blood received in culture bottles   Culture   Final    NO GROWTH 5 DAYS Performed at [facility], [address], [place], [place] [address]    Report Status 08/25/2023 FINAL  Final  Urine Culture     Status: Abnormal   Collection Time: 08/20/23  9:19 PM   Specimen: Urine, Random  Result Value Ref Range Status   Specimen Description   Final    URINE, RANDOM Performed at [facility], [address], [place], [place] [address]    Special Requests   Final    NONE Reflexed from [id number] Performed at [facility], [address] [place], [place] [address]    Culture (A)  Final    <10,000 COLONIES/mL INSIGNIFICANT GROWTH Performed at [facility], [address] [place], [place] [address]    Report Status 08/21/2023 FINAL  Final  Aerobic/Anaerobic Culture w Gram Stain (surgical/deep wound)     Status: None   Collection Time: 08/22/23  9:00 AM   Specimen: Body Fluid  Result Value Ref Range Status   Specimen Description   Final    FLUID Performed at [facility], [address] [place], [place] [address]    Special Requests   Final    NONE Performed at [facility], [address] [place], [place] [address]    Gram Stain   Final    FEW WBC PRESENT, PREDOMINANTLY PMN NO ORGANISMS SEEN    Culture   Final    RARE ESCHERICHIA COLI Confirmed Extended Spectrum Beta-Lactamase Producer (ESBL).  In bloodstream infections from ESBL organisms, carbapenems are preferred over piperacillin/tazobactam. They are shown to have a lower risk of mortality. NO ANAEROBES ISOLATED Performed at [facility], [address], [place], [place] [address]    Report Status 08/27/2023 FINAL  Final   Organism ID, Bacteria ESCHERICHIA COLI  Final      Susceptibility   Escherichia coli - MIC*    AMPICILLIN >=32 RESISTANT Resistant      CEFEPIME  >=32 RESISTANT Resistant     CEFTAZIDIME  RESISTANT Resistant     CEFTRIAXONE  >=64 RESISTANT Resistant     CIPROFLOXACIN >=4 RESISTANT Resistant     GENTAMICIN  <=1 SENSITIVE Sensitive     IMIPENEM <=0.25 SENSITIVE Sensitive     TRIMETH/SULFA <=20 SENSITIVE Sensitive     AMPICILLIN/SULBACTAM >=32 RESISTANT Resistant     PIP/TAZO <=4 SENSITIVE Sensitive ug/mL    * RARE ESCHERICHIA COLI     Radiology Studies: No results found.   Scheduled Meds:  amLODipine   5 mg Oral Daily   ammonium lactate    Topical BID   atorvastatin   40 mg Oral QPM   citalopram   40 mg Oral Daily   clopidogrel   75 mg Oral Daily   enoxaparin  (LOVENOX ) injection  40 mg Subcutaneous Q24H   ferrous sulfate   325 mg Oral Q breakfast   guaiFENesin   600 mg Oral BID   methimazole   5 mg Oral TID   mupirocin  cream   Topical Daily   risperiDONE   1 mg Oral QHS   tamsulosin   0.4 mg Oral QPC supper   Continuous Infusions:  meropenem  (MERREM ) IV 1 g (08/29/23 0522)     LOS: 9 days    Time spent:  55min  Zacchary Pompei, DO [facility]  To contact the attending physician between 7A-7P please use Epic Chat. To contact the covering physician during after hours 7P-7A, please review Amion.   08/29/2023, 7:37 AM   *This document has been created with the assistance of dictation software. Please excuse typographical errors. *

## 2023-08-30 DIAGNOSIS — I7143 Infrarenal abdominal aortic aneurysm, without rupture: Secondary | ICD-10-CM | POA: Diagnosis not present

## 2023-08-30 DIAGNOSIS — A498 Other bacterial infections of unspecified site: Secondary | ICD-10-CM | POA: Diagnosis not present

## 2023-08-30 DIAGNOSIS — F319 Bipolar disorder, unspecified: Secondary | ICD-10-CM

## 2023-08-30 DIAGNOSIS — M4646 Discitis, unspecified, lumbar region: Secondary | ICD-10-CM | POA: Diagnosis not present

## 2023-08-30 DIAGNOSIS — Z1612 Extended spectrum beta lactamase (ESBL) resistance: Secondary | ICD-10-CM | POA: Diagnosis not present

## 2023-08-30 LAB — CBC WITH DIFFERENTIAL/PLATELET
Abs Immature Granulocytes: 0.02 10*3/uL (ref 0.00–0.07)
Basophils Absolute: 0 10*3/uL (ref 0.0–0.1)
Basophils Relative: 1 %
Eosinophils Absolute: 0.1 10*3/uL (ref 0.0–0.5)
Eosinophils Relative: 1 %
HCT: 34.6 % — ABNORMAL LOW (ref 39.0–52.0)
Hemoglobin: 11.5 g/dL — ABNORMAL LOW (ref 13.0–17.0)
Immature Granulocytes: 0 %
Lymphocytes Relative: 24 %
Lymphs Abs: 1.5 10*3/uL (ref 0.7–4.0)
MCH: 28.3 pg (ref 26.0–34.0)
MCHC: 33.2 g/dL (ref 30.0–36.0)
MCV: 85 fL (ref 80.0–100.0)
Monocytes Absolute: 0.6 10*3/uL (ref 0.1–1.0)
Monocytes Relative: 9 %
Neutro Abs: 4.1 10*3/uL (ref 1.7–7.7)
Neutrophils Relative %: 65 %
Platelets: 184 10*3/uL (ref 150–400)
RBC: 4.07 MIL/uL — ABNORMAL LOW (ref 4.22–5.81)
RDW: 16.5 % — ABNORMAL HIGH (ref 11.5–15.5)
WBC: 6.3 10*3/uL (ref 4.0–10.5)
nRBC: 0 % (ref 0.0–0.2)

## 2023-08-30 LAB — COMPREHENSIVE METABOLIC PANEL
ALT: 13 U/L (ref 0–44)
AST: 17 U/L (ref 15–41)
Albumin: 3 g/dL — ABNORMAL LOW (ref 3.5–5.0)
Alkaline Phosphatase: 101 U/L (ref 38–126)
Anion gap: 10 (ref 5–15)
BUN: 14 mg/dL (ref 8–23)
CO2: 26 mmol/L (ref 22–32)
Calcium: 9.4 mg/dL (ref 8.9–10.3)
Chloride: 98 mmol/L (ref 98–111)
Creatinine, Ser: 0.59 mg/dL — ABNORMAL LOW (ref 0.61–1.24)
GFR, Estimated: >60 mL/min (ref >60)
Glucose, Bld: 109 mg/dL — ABNORMAL HIGH (ref 70–99)
Potassium: 4 mmol/L (ref 3.5–5.1)
Sodium: 134 mmol/L — ABNORMAL LOW (ref 135–145)
Total Bilirubin: 0.7 mg/dL (ref 0.0–1.2)
Total Protein: 7.3 g/dL (ref 6.5–8.1)

## 2023-08-30 LAB — SEDIMENTATION RATE: Sed Rate: 51 mm/h — ABNORMAL HIGH (ref 0–20)

## 2023-08-30 LAB — C-REACTIVE PROTEIN: CRP: 1.2 mg/dL — ABNORMAL HIGH (ref <1.0)

## 2023-08-30 MED ORDER — FLUPHENAZINE HCL 5 MG PO TABS
5.0000 mg | ORAL_TABLET | Freq: Every day | ORAL | Status: DC
  Administered 2023-08-30 – 2023-09-01 (×3): 5 mg via ORAL
  Filled 2023-08-30 (×3): qty 1

## 2023-08-30 NOTE — TOC Progression Note (Signed)
 Transition of Care [facility]) - Progression Note    Patient Details  Name: [name] MRN: [id number] Date of Birth: [birth year]  Transition of [facility]) CM/SW Contact  Odilia Bennett, LCSW Phone Number: 08/30/2023, 9:46 AM  Clinical Narrative:  PASARR still pending.   Expected Discharge Plan and Services                                               Social Determinants of Health (SDOH) Interventions SDOH Screenings   Food Insecurity: Patient Unable To Answer (08/23/2023)  Housing: Patient Unable To Answer (08/23/2023)  Transportation Needs: Patient Unable To Answer (08/23/2023)  Utilities: Patient Unable To Answer (08/23/2023)  Depression (PHQ2-9): Low Risk  (12/12/2020)  Social Connections: Patient Unable To Answer (08/23/2023)  Tobacco Use: High Risk (08/20/2023)    Readmission Risk Interventions    05/09/2023    4:34 PM  Readmission Risk Prevention Plan  Transportation Screening Complete  PCP or Specialist Appt within 5-7 Days Complete  Home Care Screening Complete  Medication Review (RN CM) Complete

## 2023-08-30 NOTE — Plan of Care (Signed)
  Problem: Elimination: Goal: Will not experience complications related to bowel motility Outcome: Progressing Goal: Will not experience complications related to urinary retention Outcome: Progressing   Problem: Elimination: Goal: Will not experience complications related to urinary retention Outcome: Progressing   Problem: Pain Managment: Goal: General experience of comfort will improve and/or be controlled Outcome: Progressing   Problem: Safety: Goal: Ability to remain free from injury will improve Outcome: Progressing

## 2023-08-30 NOTE — Progress Notes (Signed)
 Physical Therapy Treatment Patient Details Name: [name] MRN: [id number] DOB: 1951-04-07 Today's Date: 08/30/2023   History of Present Illness 73 year old man past medical history of dementia, bipolar disorder, hypertension, COPD, diastolic congestive heart failure, stroke, hypothyroidism, ESBL UTI and history of right hip fracture with local cellulitis in November 2024.  In the ER he was hypotensive and tachycardic with an elevated white count and lactic acid .    PT Comments  Pt with flat affect but pleasant and put forth good effort throughout.  Pt required min A with bed mobility tasks along with cues for use of bed rail.  Pt required +2 mod A to come to standing from various surfaces with performance limited primarily by pt's inability to flex forward at the hips.  Once in standing pt required only occasional min A for stability and to guide the RW and was able to ambulate 6' x 3 with chair follow for safety.  Pt reported no adverse symptoms during the session with SpO2 and HR WNL throughout on room air.  Pt will benefit from continued PT services upon discharge to safely address deficits listed in patient problem list for decreased caregiver assistance and eventual return to PLOF.      If plan is discharge home, recommend the following: Two people to help with walking and/or transfers;Two people to help with bathing/dressing/bathroom;Assist for transportation;Help with stairs or ramp for entrance   Can travel by private vehicle        Equipment Recommendations  Other (comment) (TBD)    Recommendations for Other Services       Precautions / Restrictions Precautions Precautions: Fall Restrictions Weight Bearing Restrictions Per Provider Order: No     Mobility  Bed Mobility Overal bed mobility: Needs Assistance Bed Mobility: Supine to Sit     Supine to sit: Min assist     General bed mobility comments: Min A for trunk control to full upright position with cues for use  of rail    Transfers Overall transfer level: Needs assistance Equipment used: Rolling walker (2 wheels) Transfers: Sit to/from Stand Sit to Stand: From elevated surface, +2 physical assistance, Mod assist           General transfer comment: Max multi-modal cues for increased trunk flexion to facilitate improved sit to stand transfers from various surfaces    Ambulation/Gait Ambulation/Gait assistance: Min assist Gait Distance (Feet): 6 Feet x 3 Assistive device: Rolling walker (2 wheels) Gait Pattern/deviations: Step-through pattern, Decreased step length - right, Decreased step length - left Gait velocity: decreased     General Gait Details: Slow cadence with occasional min A for stability and to guide the RW, chair follow for safety   Stairs             Wheelchair Mobility     Tilt Bed    Modified Rankin (Stroke Patients Only)       Balance Overall balance assessment: Needs assistance Sitting-balance support: Feet supported, Bilateral upper extremity supported Sitting balance-Leahy Scale: Fair     Standing balance support: Bilateral upper extremity supported, Reliant on assistive device for balance, During functional activity Standing balance-Leahy Scale: Poor                              Cognition Arousal: Alert Behavior During Therapy: Flat affect Overall Cognitive Status: No family/caregiver present to determine baseline cognitive functioning  Exercises      General Comments        Pertinent Vitals/Pain Pain Assessment Pain Assessment: No/denies pain    Home Living                          Prior Function            PT Goals (current goals can now be found in the care plan section) Progress towards PT goals: Progressing toward goals    Frequency    Min 1X/week      PT Plan      Co-evaluation              AM-PAC PT "6 Clicks" Mobility    Outcome Measure  Help needed turning from your back to your side while in a flat bed without using bedrails?: A Little Help needed moving from lying on your back to sitting on the side of a flat bed without using bedrails?: A Little Help needed moving to and from a bed to a chair (including a wheelchair)?: A Lot Help needed standing up from a chair using your arms (e.g., wheelchair or bedside chair)?: A Lot Help needed to walk in hospital room?: A Lot Help needed climbing 3-5 steps with a railing? : Total 6 Click Score: 13    End of Session Equipment Utilized During Treatment: Gait belt Activity Tolerance: Patient tolerated treatment well Patient left: in chair;with call bell/phone within reach;with chair alarm set Nurse Communication: Mobility status PT Visit Diagnosis: Difficulty in walking, not elsewhere classified (R26.2);Other abnormalities of gait and mobility (R26.89);Muscle weakness (generalized) (M62.81);Unsteadiness on feet (R26.81)     Time: 4098-1191 PT Time Calculation (min) (ACUTE ONLY): 21 min  Charges:    $Gait Training: 8-22 mins PT General Charges $$ ACUTE PT VISIT: 1 Visit                     D. Scott Jamielee Mchale PT, DPT 08/30/23, 12:13 PM

## 2023-08-30 NOTE — Progress Notes (Signed)
 PROGRESS NOTE    [name]  MVH:[id number] DOB: 24-May-1951 DOA: 08/20/2023 PCP: Tobi Fortes, NP  Chief Complaint  Patient presents with   Altered Mental Status    Hospital Course:  [name] is 73 y.o. male with dementia, bipolar disorder, hypertension, COPD, diastolic congestive heart failure, CVA, hypothyroidism, ESBL UTI history, and history of right hip fracture with local cellulitis in November 2024.  He presented to the ER septic.  CT scan revealed cystic lesion in the right kidney, discitis with possible osteomyelitis at L1-L2, significant stenosis in the iliac vessels with occlusion of the right SFA and 3.2 cm abdominal aortic aneurysm.  MRI then confirmed osteomyelitis and discitis at L1-L2 with an abscess in the right psoas muscle.  Interventional radiology was consulted and underwent drainage of the abscess on 2/2.  Infectious disease was consulted.  ESBL E. coli is growing out of the discitis culture.  He is currently on meropenem .  Subjective: No acute events overnight. No complaints this AM   Objective: Vitals:   08/29/23 1702 08/29/23 1945 08/30/23 0455 08/30/23 0759  BP: 111/80 114/66 137/73 127/82  Pulse: 95 97 88 79  Resp: 18 18 18 15   Temp: 98.1 F (36.7 C) 98.4 F (36.9 C) 98.1 F (36.7 C) 97.9 F (36.6 C)  TempSrc:      SpO2: 93% 100% 97% 97%  Weight:      Height:        Intake/Output Summary (Last 24 hours) at 08/30/2023 8413 Last data filed at 08/30/2023 0455 Gross per 24 hour  Intake --  Output 1100 ml  Net -1100 ml     Filed Weights   08/21/23 1326  Weight: 68.9 kg    Examination: General exam: Appears calm and comfortable, NAD  Respiratory system: No work of breathing, symmetric chest wall expansion Cardiovascular system: S1 & S2 heard, RRR.  Gastrointestinal system: Abdomen is nondistended, soft and nontender.  Neuro: drowsy but arousable, oriented only to self, does not answer questions consistently, requires significant  prompting. Extremities: Symmetric, expected ROM Skin: No rashes, lesions Psychiatry: Mood & affect appropriate for situation.   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, L1-2   Psoas abscess (HCC)   Chronic diastolic CHF (congestive heart failure) (HCC)   Aspiration pneumonia (HCC)   Acute metabolic encephalopathy   Hypertension   Thrombocytopenia (HCC)   Aortic atherosclerosis (HCC)   Hyperthyroidism   AAA (abdominal aortic aneurysm) (HCC)   COPD (chronic obstructive pulmonary disease) (HCC)   Bipolar disorder (HCC)   PVD (peripheral vascular disease) (HCC)   Urinary tract infection   Dementia without behavioral disturbance (HCC)   Discitis of lumbar region   Fungal infection of toenail   Hypotension    Severe sepsis - Criteria met on arrival with: Hypotension, tachycardia, leukocytosis, acute metabolic encephalopathy, lactic acidosis, discitis and osteomyelitis - Antibiotics as below  Discitis and osteomyelitis of lumbar region, L1-2 - Infectious disease consulted - Status post discitis culture with IR 2/2 - Culture growing ESBL E. coli - On Merrem  now, vancomycin  has been discontinued.  Will need once a day ertapenem  for 6 weeks at discharge (end date 10/03/23)  Right psoas abscess - Interventional radiology unable to get culture of this area - Antibiotics as above  History of right hip repair November/2024 - Complicated by right hip cellulitis at that time - Appears stable at this time  Aspiration pneumonia -  Currently on Merrem  and vancomycin  - On dysphagia 2 diet  Chronic diastolic heart failure - Euvolemic, hold diuretics for now  Acute metabolic encephalopathy - Secondary to sepsis - Appears at baseline now  Hypertension - Continue Norvasc   Thrombocytopenia - Secondary to sepsis - Trend CBC  Hyperthyroidism - Continue methimazole   Abdominal aortic  aneurysm Aortic atherosclerosis - Continue statin  Peripheral vascular disease - Needs vascular follow-up outpatient - Continue Plavix  and statin for now - Has aspirin allergy, noted  Bipolar disorder Schizophrenia Dementia without behavioral disturbance - Continue home meds Risperdal  and Celexa  -- Group home has reported patient is also meant to be on fluphenazine , will restart today.  COPD - Not currently in exacerbation - As needed DuoNebs  Urinary tract infection, ruled out.  Urine culture negative  Fungal infection of toenail Toenail overgrowth - Podiatry completed bedside debridement 2/4  Normocytic anemia - Stable, trend  Right Renal Lower Pole Cystic Lesion -Incidentally seen on CT, 2.4 x 2.4, neoplasm is possible - MRI w/wo: Concerning for renal cell carcinoma. Urology consulted, recommends outpatient follow-up  Physical deconditioning PT score: 10 OT score: 16 - Needs SNF at discharge.  TOC consulted  DVT prophylaxis Lovenox     Code Status: Full Code Family Communication: Discussed directly with patient Disposition:  Status is: Inpatient, patient is medically cleared 2/5. Pending SNF arrangements.  Consultants:    Treatment Team:  Consulting Physician: Alica Inks, MD Consulting Physician: Pink Bridges, DPM Consulting Physician: Melody Spurling., MD  Procedures:    Antimicrobials:  Anti-infectives (From admission, onward)    Start     Dose/Rate Route Frequency Ordered Stop   08/23/23 2130  meropenem  (MERREM ) 1 g in sodium chloride  0.9 % 100 mL IVPB        1 g 200 mL/hr over 30 Minutes Intravenous Every 8 hours 08/23/23 2037     08/22/23 1800  vancomycin  (VANCOREADY) IVPB 1750 mg/350 mL  Status:  Discontinued        1,750 mg 175 mL/hr over 120 Minutes Intravenous Every 24 hours 08/22/23 1006 08/24/23 1530   08/21/23 0700  vancomycin  (VANCOCIN ) IVPB 1000 mg/200 mL premix  Status:  Discontinued        1,000 mg 200 mL/hr over 60  Minutes Intravenous Every 12 hours 08/21/23 0400 08/22/23 1006   08/21/23 0415  vancomycin  (VANCOCIN ) 750 mg in sodium chloride  0.9 % 250 mL IVPB  Status:  Discontinued        750 mg 250 mL/hr over 60 Minutes Intravenous  Once 08/21/23 0402 08/21/23 0642   08/21/23 0400  vancomycin  (VANCOREADY) IVPB 750 mg/150 mL  Status:  Discontinued        750 mg 150 mL/hr over 60 Minutes Intravenous  Once 08/21/23 0352 08/21/23 0800   08/21/23 0345  ceFEPIme  (MAXIPIME ) 2 g in sodium chloride  0.9 % 100 mL IVPB  Status:  Discontinued        2 g 200 mL/hr over 30 Minutes Intravenous Every 8 hours 08/21/23 0340 08/23/23 2011   08/20/23 1645  ceFEPIme  (MAXIPIME ) 2 g in sodium chloride  0.9 % 100 mL IVPB        2 g 200 mL/hr over 30 Minutes Intravenous  Once 08/20/23 1641 08/20/23 1844   08/20/23 1645  vancomycin  (VANCOCIN ) IVPB 1000 mg/200 mL premix        1,000 mg 200 mL/hr over 60 Minutes Intravenous  Once 08/20/23 1641 08/20/23 2157       Data Reviewed: I have personally reviewed  following labs and imaging studies CBC: Recent Labs  Lab 08/25/23 0601  WBC 5.3  HGB 10.8*  HCT 33.7*  MCV 85.1  PLT 149*   Basic Metabolic Panel: Recent Labs  Lab 08/24/23 0543 08/25/23 0601  NA  --  133*  K  --  3.7  CL  --  101  CO2  --  24  GLUCOSE  --  82  BUN  --  11  CREATININE 0.54* 0.56*  CALCIUM   --  9.2   GFR: Estimated Creatinine Clearance: 75.3 mL/min (A) (by C-G formula based on SCr of 0.56 mg/dL (L)). Liver Function Tests: No results for input(s): "AST", "ALT", "ALKPHOS", "BILITOT", "PROT", "ALBUMIN" in the last 168 hours.  CBG: No results for input(s): "GLUCAP" in the last 168 hours.  Recent Results (from the past 240 hours)  Resp panel by RT-PCR (RSV, Flu A&B, Covid) Anterior Nasal Swab     Status: None   Collection Time: 08/20/23  5:19 PM   Specimen: Anterior Nasal Swab  Result Value Ref Range Status   SARS Coronavirus 2 by RT PCR NEGATIVE NEGATIVE Final    Comment:  (NOTE) SARS-CoV-2 target nucleic acids are NOT DETECTED.  The SARS-CoV-2 RNA is generally detectable in upper respiratory specimens during the acute phase of infection. The lowest concentration of SARS-CoV-2 viral copies this assay can detect is 138 copies/mL. A negative result does not preclude SARS-Cov-2 infection and should not be used as the sole basis for treatment or other patient management decisions. A negative result may occur with  improper specimen collection/handling, submission of specimen other than nasopharyngeal swab, presence of viral mutation(s) within the areas targeted by this assay, and inadequate number of viral copies(<138 copies/mL). A negative result must be combined with clinical observations, patient history, and epidemiological information. The expected result is Negative.  Fact Sheet for Patients:  BloggerCourse.com  Fact Sheet for Healthcare Providers:  SeriousBroker.it  This test is no t yet approved or cleared by the [place]  FDA and  has been authorized for detection and/or diagnosis of SARS-CoV-2 by FDA under an Emergency Use Authorization (EUA). This EUA will remain  in effect (meaning this test can be used) for the duration of the COVID-19 declaration under Section 564(b)(1) of the Act, 21 U.S.C.section 360bbb-3(b)(1), unless the authorization is terminated  or revoked sooner.       Influenza A by PCR NEGATIVE NEGATIVE Final   Influenza B by PCR NEGATIVE NEGATIVE Final    Comment: (NOTE) The Xpert Xpress SARS-CoV-2/FLU/RSV plus assay is intended as an aid in the diagnosis of influenza from Nasopharyngeal swab specimens and should not be used as a sole basis for treatment. Nasal washings and aspirates are unacceptable for Xpert Xpress SARS-CoV-2/FLU/RSV testing.  Fact Sheet for Patients: BloggerCourse.com  Fact Sheet for Healthcare  Providers: SeriousBroker.it  This test is not yet approved or cleared by the [place]  FDA and has been authorized for detection and/or diagnosis of SARS-CoV-2 by FDA under an Emergency Use Authorization (EUA). This EUA will remain in effect (meaning this test can be used) for the duration of the COVID-19 declaration under Section 564(b)(1) of the Act, 21 U.S.C. section 360bbb-3(b)(1), unless the authorization is terminated or revoked.     Resp Syncytial Virus by PCR NEGATIVE NEGATIVE Final    Comment: (NOTE) Fact Sheet for Patients: BloggerCourse.com  Fact Sheet for Healthcare Providers: SeriousBroker.it  This test is not yet approved or cleared by the [place]  FDA and has been authorized for  detection and/or diagnosis of SARS-CoV-2 by FDA under an Emergency Use Authorization (EUA). This EUA will remain in effect (meaning this test can be used) for the duration of the COVID-19 declaration under Section 564(b)(1) of the Act, 21 U.S.C. section 360bbb-3(b)(1), unless the authorization is terminated or revoked.  Performed at [facility], [address] [place], [place] [address]   Blood culture (routine x 2)     Status: None   Collection Time: 08/20/23  6:47 PM   Specimen: BLOOD RIGHT ARM  Result Value Ref Range Status   Specimen Description BLOOD RIGHT ARM  Final   Special Requests   Final    AEROBIC BOTTLE ONLY Blood Culture results may not be optimal due to an inadequate volume of blood received in culture bottles   Culture   Final    NO GROWTH 5 DAYS Performed at [facility], [address] [place], [place] [address]    Report Status 08/25/2023 FINAL  Final  Blood culture (routine x 2)     Status: None   Collection Time: 08/20/23  6:47 PM   Specimen: BLOOD RIGHT HAND  Result Value Ref Range Status   Specimen Description BLOOD RIGHT HAND  Final   Special Requests    Final    AEROBIC BOTTLE ONLY Blood Culture results may not be optimal due to an inadequate volume of blood received in culture bottles   Culture   Final    NO GROWTH 5 DAYS Performed at [facility], [address], [place], [place] [address]    Report Status 08/25/2023 FINAL  Final  Urine Culture     Status: Abnormal   Collection Time: 08/20/23  9:19 PM   Specimen: Urine, Random  Result Value Ref Range Status   Specimen Description   Final    URINE, RANDOM Performed at [facility], [address] [place], [place] [address]    Special Requests   Final    NONE Reflexed from [id number] Performed at [facility], [address] [place], [place] [address]    Culture (A)  Final    <10,000 COLONIES/mL INSIGNIFICANT GROWTH Performed at [facility], [address] [place], [place] [address]    Report Status 08/21/2023 FINAL  Final  Aerobic/Anaerobic Culture w Gram Stain (surgical/deep wound)     Status: None   Collection Time: 08/22/23  9:00 AM   Specimen: Body Fluid  Result Value Ref Range Status   Specimen Description   Final    FLUID Performed at [facility], [address] [place], [place] [address]    Special Requests   Final    NONE Performed at [facility], [address] [place], [place] [address]    Gram Stain   Final    FEW WBC PRESENT, PREDOMINANTLY PMN NO ORGANISMS SEEN    Culture   Final    RARE ESCHERICHIA COLI Confirmed Extended Spectrum Beta-Lactamase Producer (ESBL).  In bloodstream infections from ESBL organisms, carbapenems are preferred over piperacillin/tazobactam. They are shown to have a lower risk of mortality. NO ANAEROBES ISOLATED Performed at [facility], [address], [place], [place] [address]    Report Status 08/27/2023 FINAL  Final   Organism ID, Bacteria ESCHERICHIA COLI  Final      Susceptibility   Escherichia coli - MIC*    AMPICILLIN >=32 RESISTANT Resistant      CEFEPIME  >=32 RESISTANT Resistant     CEFTAZIDIME RESISTANT Resistant  CEFTRIAXONE  >=64 RESISTANT Resistant     CIPROFLOXACIN >=4 RESISTANT Resistant     GENTAMICIN  <=1 SENSITIVE Sensitive     IMIPENEM <=0.25 SENSITIVE Sensitive     TRIMETH/SULFA <=20 SENSITIVE Sensitive     AMPICILLIN/SULBACTAM >=32 RESISTANT Resistant     PIP/TAZO <=4 SENSITIVE Sensitive ug/mL    * RARE ESCHERICHIA COLI     Radiology Studies: No results found.   Scheduled Meds:  amLODipine   5 mg Oral Daily   ammonium lactate    Topical BID   atorvastatin   40 mg Oral QPM   citalopram   40 mg Oral Daily   clopidogrel   75 mg Oral Daily   enoxaparin  (LOVENOX ) injection  40 mg Subcutaneous Q24H   ferrous sulfate   325 mg Oral Q breakfast   guaiFENesin   600 mg Oral BID   methimazole   5 mg Oral TID   mupirocin  cream   Topical Daily   risperiDONE   1 mg Oral QHS   tamsulosin   0.4 mg Oral QPC supper   Continuous Infusions:  meropenem  (MERREM ) IV 1 g (08/30/23 0553)     LOS: 10 days    Time spent:  55min  Yaretsi Humphres, DO [facility]  To contact the attending physician between 7A-7P please use Epic Chat. To contact the covering physician during after hours 7P-7A, please review Amion.   08/30/2023, 9:17 AM   *This document has been created with the assistance of dictation software. Please excuse typographical errors. *

## 2023-08-30 NOTE — Treatment Plan (Signed)
 Diagnosis: ESBL ECOLI osteomyelitis and discitis of lumbar spine Baseline Creatinine 0.59    Allergies  Allergen Reactions   Asa [Aspirin]     Hx GI bleed    OPAT Orders Discharge antibiotics: Ertapenem  1 gram IV every 24 hours Duration: 6 weeks End Date: 10/03/23  [facility] Care Per Protocol:including placement of biopatch  Labs weekly while on IV antibiotics: X__ CBC with differential  _X_ CMP _X_ CRP _X_ ESR   _X_ Please pull PIC at completion of IV antibiotics   Fax weekly lab results  promptly to [phone]  Clinic Follow Up Appt:with Dr.Christina Gintz 09/30/23 at 10.45 Am ( video VISIT)   Call [phone] with any questions or critical value

## 2023-08-30 NOTE — Progress Notes (Signed)
 Date of Admission:  08/20/2023      ID: Abduljabbar Koski is a 73 y.o. 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, L1-2   Psoas abscess (HCC)   Aspiration pneumonia (HCC)   Bipolar disorder (HCC)   Dementia without behavioral disturbance (HCC)   AAA (abdominal aortic aneurysm) (HCC)   Aortic atherosclerosis (HCC)   Acute metabolic encephalopathy   PVD (peripheral vascular disease) (HCC)   Discitis of lumbar region   Fungal infection of toenail   Hypotension    Subjective: Pt has no complaints  Sitting in chair   Medications:   amLODipine   5 mg Oral Daily   ammonium lactate    Topical BID   atorvastatin   40 mg Oral QPM   citalopram   40 mg Oral Daily   clopidogrel   75 mg Oral Daily   enoxaparin  (LOVENOX ) injection  40 mg Subcutaneous Q24H   ferrous sulfate   325 mg Oral Q breakfast   guaiFENesin   600 mg Oral BID   methimazole   5 mg Oral TID   mupirocin  cream   Topical Daily   risperiDONE   1 mg Oral QHS   tamsulosin   0.4 mg Oral QPC supper    Objective: Vital signs in last 24 hours: Patient Vitals for the past 24 hrs:  BP Temp Pulse Resp SpO2  08/30/23 0759 127/82 97.9 F (36.6 C) 79 15 97 %  08/30/23 0455 137/73 98.1 F (36.7 C) 88 18 97 %  08/29/23 1945 114/66 98.4 F (36.9 C) 97 18 100 %  08/29/23 1702 111/80 98.1 F (36.7 C) 95 18 93 %       PHYSICAL EXAM:  General: Awake, alert, oriented to self Lungs: Bilateral air entry Heart: Irregular  abdomen: Soft, non-tender,not distended. Bowel sounds normal. No masses Extremities: atraumatic, no cyanosis. No edema. No clubbing Skin: No rashes or lesions. Or bruising Lymph: Cervical, supraclavicular normal. Neurologic: Grossly non-focal  Lab  Results    Latest Ref Rng & Units 08/25/2023    6:01 AM 08/23/2023    5:29 AM 08/22/2023    6:16 AM  CBC  WBC 4.0 - 10.5 K/uL 5.3  5.3  6.6   Hemoglobin 13.0 - 17.0 g/dL 16.1  09.6  04.5   Hematocrit 39.0 - 52.0 % 33.7  35.3  35.0   Platelets 150 - 400 K/uL 149  126  124        Latest Ref Rng & Units 08/25/2023    6:01 AM 08/24/2023    5:43 AM 08/23/2023    5:29 AM  CMP  Glucose 70 - 99 mg/dL 82   79   BUN 8 - 23 mg/dL 11   8   Creatinine 4.09 - 1.24 mg/dL 8.11  9.14  7.82   Sodium 135 - 145 mmol/L 133   135   Potassium 3.5 - 5.1 mmol/L 3.7   3.5   Chloride 98 - 111 mmol/L 101   100   CO2 22 - 32 mmol/L 24   24   Calcium  8.9 - 10.3 mg/dL 9.2   9.2       Microbiology: Lumbar disc space culture is ESBL E. coli    Assessment/Plan:  73 year old male coming from group home with altered mental  status.  He has a history of schizophrenia, bipolar disorder, COPD and recent right hip fracture with ORIF which is healed well He has a history of ESBL E. coli bacteremia.  L1 and L2 discitis and osteomyelitis.  Status post aspiration of the disc space showed ESBL E. coli.  Patient is currently on meropenem    is going to need at least 6 weeks of antibiotics.  Once a day ertapenem  is an option on discharge=10/03/23   Right hip surgical site is healed well  Schizophrenia on risperidone   Hyperthyroidism on methimazole   Hypertension on amlodipine   Hyperlipidemia on atorvastatin   COPD on nebulizers  Right renal lower pole cystic lesion  Inferior abdominal aorta aneurysmal dilatation.  Discussed the management with the care team. OPAT orders placed Follow up as OP

## 2023-08-30 NOTE — Care Management Important Message (Signed)
 Important Message  Patient Details  Name: [name] MRN: [id number] Date of Birth: July 28, 1950   Important Message Given:  Yes - Medicare IM     Felix Host 08/30/2023, 3:07 PM

## 2023-08-31 ENCOUNTER — Other Ambulatory Visit: Payer: Self-pay

## 2023-08-31 DIAGNOSIS — Z1612 Extended spectrum beta lactamase (ESBL) resistance: Secondary | ICD-10-CM | POA: Diagnosis not present

## 2023-08-31 DIAGNOSIS — M4646 Discitis, unspecified, lumbar region: Secondary | ICD-10-CM | POA: Diagnosis not present

## 2023-08-31 DIAGNOSIS — A498 Other bacterial infections of unspecified site: Secondary | ICD-10-CM | POA: Diagnosis not present

## 2023-08-31 MED ORDER — SODIUM CHLORIDE 0.9 % IV SOLN
1.0000 g | Freq: Every day | INTRAVENOUS | Status: DC
  Administered 2023-08-31 – 2023-09-01 (×2): 1 g via INTRAVENOUS
  Filled 2023-08-31 (×2): qty 1000

## 2023-08-31 MED ORDER — CHLORHEXIDINE GLUCONATE CLOTH 2 % EX PADS
6.0000 | MEDICATED_PAD | Freq: Every day | CUTANEOUS | Status: DC
  Administered 2023-08-31 – 2023-09-01 (×2): 6 via TOPICAL

## 2023-08-31 MED ORDER — SODIUM CHLORIDE 0.9% FLUSH
10.0000 mL | Freq: Two times a day (BID) | INTRAVENOUS | Status: DC
  Administered 2023-08-31 – 2023-09-01 (×3): 10 mL

## 2023-08-31 MED ORDER — SODIUM CHLORIDE 0.9% FLUSH: 10.0000 mL | INTRAVENOUS | Status: DC | PRN

## 2023-08-31 NOTE — Plan of Care (Signed)
  Problem: Fluid Volume: Goal: Hemodynamic stability will improve Outcome: Progressing   Problem: Clinical Measurements: Goal: Diagnostic test results will improve Outcome: Progressing Goal: Signs and symptoms of infection will decrease Outcome: Progressing   Problem: Respiratory: Goal: Ability to maintain adequate ventilation will improve Outcome: Progressing   Problem: Clinical Measurements: Goal: Ability to maintain clinical measurements within normal limits will improve Outcome: Progressing Goal: Will remain free from infection Outcome: Progressing Goal: Diagnostic test results will improve Outcome: Progressing Goal: Respiratory complications will improve Outcome: Progressing Goal: Cardiovascular complication will be avoided Outcome: Progressing   Problem: Activity: Goal: Risk for activity intolerance will decrease Outcome: Progressing   Problem: Nutrition: Goal: Adequate nutrition will be maintained Outcome: Progressing   Problem: Coping: Goal: Level of anxiety will decrease Outcome: Progressing   Problem: Elimination: Goal: Will not experience complications related to bowel motility Outcome: Progressing Goal: Will not experience complications related to urinary retention Outcome: Progressing   Problem: Pain Managment: Goal: General experience of comfort will improve and/or be controlled Outcome: Progressing   Problem: Safety: Goal: Ability to remain free from injury will improve Outcome: Progressing   Problem: Skin Integrity: Goal: Risk for impaired skin integrity will decrease Outcome: Progressing

## 2023-08-31 NOTE — Progress Notes (Signed)
Peripherally Inserted Central Catheter Placement  The IV Nurse has discussed with the patient and/or persons authorized to consent for the patient, the purpose of this procedure and the potential benefits and risks involved with this procedure.  The benefits include less needle sticks, lab draws from the catheter, and the patient may be discharged home with the catheter. Risks include, but not limited to, infection, bleeding, blood clot (thrombus formation), and puncture of an artery; nerve damage and irregular heartbeat and possibility to perform a PICC exchange if needed/ordered by physician.  Alternatives to this procedure were also discussed.  Bard Power PICC patient education guide, fact sheet on infection prevention and patient information card has been provided to patient /or left at bedside.    PICC Placement Documentation  PICC Single Lumen 08/31/23 Right Brachial 42 cm 0 cm (Active)  Indication for Insertion or Continuance of Line Prolonged intravenous therapies 08/31/23 1240  Exposed Catheter (cm) 0 cm 08/31/23 1240  Site Assessment Clean, Dry, Intact 08/31/23 1240  Line Status Flushed;Saline locked;Blood return noted 08/31/23 1240  Dressing Type Transparent;Securing device 08/31/23 1240  Dressing Status Antimicrobial disc/dressing in place;Clean, Dry, Intact 08/31/23 1240  Line Care Connections checked and tightened 08/31/23 1240  Line Adjustment (NICU/IV Team Only) No 08/31/23 1240  Dressing Intervention New dressing;Adhesive placed at insertion site (IV team only) 08/31/23 1240  Dressing Change Due 09/07/23 08/31/23 1240   Telephone consent by Talmadge Chad Albarece 08/31/2023, 1:03 PM

## 2023-08-31 NOTE — TOC Progression Note (Signed)
Transition of Care [facility]) - Progression Note    Patient Details  Name: [name] MRN: [id number] Date of Birth: April 08, 1951  Transition of [facility]) CM/SW Contact  Allena Katz, LCSW Phone Number: 08/31/2023, 8:55 AM  Clinical Narrative:   Passr still pending.           Expected Discharge Plan and Services                                               Social Determinants of Health (SDOH) Interventions SDOH Screenings   Food Insecurity: Patient Unable To Answer (08/23/2023)  Housing: Patient Unable To Answer (08/23/2023)  Transportation Needs: Patient Unable To Answer (08/23/2023)  Utilities: Patient Unable To Answer (08/23/2023)  Depression (PHQ2-9): Low Risk  (12/12/2020)  Social Connections: Patient Unable To Answer (08/23/2023)  Tobacco Use: High Risk (08/20/2023)    Readmission Risk Interventions    05/09/2023    4:34 PM  Readmission Risk Prevention Plan  Transportation Screening Complete  PCP or Specialist Appt within 5-7 Days Complete  Home Care Screening Complete  Medication Review (RN CM) Complete

## 2023-08-31 NOTE — TOC Progression Note (Signed)
Transition of Care [facility]) - Progression Note    Patient Details  Name: [name] MRN: [id number] Date of Birth: [birth year]  Transition of Care [facility]) CM/SW Contact  Allena Katz, LCSW Phone Number: 08/31/2023, 3:18 PM  Clinical Narrative:   Glennon Mac [phone]        Expected Discharge Plan and Services                                               Social Determinants of Health (SDOH) Interventions SDOH Screenings   Food Insecurity: Patient Unable To Answer (08/23/2023)  Housing: Patient Unable To Answer (08/23/2023)  Transportation Needs: Patient Unable To Answer (08/23/2023)  Utilities: Patient Unable To Answer (08/23/2023)  Depression (PHQ2-9): Low Risk  (12/12/2020)  Social Connections: Patient Unable To Answer (08/23/2023)  Tobacco Use: High Risk (08/20/2023)    Readmission Risk Interventions    05/09/2023    4:34 PM  Readmission Risk Prevention Plan  Transportation Screening Complete  PCP or Specialist Appt within 5-7 Days Complete  Home Care Screening Complete  Medication Review (RN CM) Complete

## 2023-08-31 NOTE — Progress Notes (Signed)
   08/31/23 1500  Spiritual Encounters  Type of Visit Initial  Care provided to: Patient  Referral source Nurse (RN/NT/LPN)  Reason for visit Routine spiritual support  OnCall Visit No  Interventions  Spiritual Care Interventions Made Established relationship of care and support;Compassionate presence   Chaplain showed compassionate presence

## 2023-08-31 NOTE — Progress Notes (Signed)
PHARMACY CONSULT NOTE FOR:  OUTPATIENT  PARENTERAL ANTIBIOTIC THERAPY (OPAT)  Indication: ESBL E coli discitis/osteomyelitis with psoas abscess Regimen: Ertapenem 1gm IV q24h End date: 10/03/2023  Labs - Once weekly:  CBC/D, CMP, ESR and CRP Fax weekly lab results  promptly to [phone] Method of administration: Mini-Bag Plus / Gravity Method of administration may be changed at the discretion of nursing facility and its pharmacy Please pull PIC at completion of IV antibiotics Call [phone] with any questions or critical value   IV antibiotic discharge orders are pended. To discharging provider:  please sign these orders via discharge navigator,  Select New Orders & click on the button choice - Manage This Unsigned Work.     Thank you for allowing pharmacy to be a part of this patient's care.  Juliette Alcide, PharmD, BCPS, BCIDP Work Cell: [phone] 08/31/2023 8:03 AM

## 2023-08-31 NOTE — Progress Notes (Signed)
PROGRESS NOTE    [name]  YQM:[id number] DOB: 14-Oct-1950 DOA: 08/20/2023 PCP: Koren Bound, NP  Chief Complaint  Patient presents with   Altered Mental Status    Hospital Course:  [name] is 73 y.o. male with dementia, bipolar disorder, hypertension, COPD, diastolic congestive heart failure, CVA, hypothyroidism, ESBL UTI history, and history of right hip fracture with local cellulitis in November 2024.  He presented to the ER septic.  CT scan revealed cystic lesion in the right kidney, discitis with possible osteomyelitis at L1-L2, significant stenosis in the iliac vessels with occlusion of the right SFA and 3.2 cm abdominal aortic aneurysm.  MRI then confirmed osteomyelitis and discitis at L1-L2 with an abscess in the right psoas muscle.  Interventional radiology was consulted and underwent drainage of the abscess on 2/2.  Infectious disease was consulted.  ESBL E. coli is growing out of the discitis culture.  He is currently on meropenem.  Stay has been prolonged pending SNF placement and IV antibiotic arrangement outpatient  Subjective: No acute events overnight. No complaints this AM   Objective: Vitals:   08/30/23 1721 08/30/23 1944 08/31/23 0600 08/31/23 0928  BP: 100/67 (!) 123/59 122/79 116/66  Pulse: 94 92 76 84  Resp: 18 18 18 18   Temp: 98.4 F (36.9 C) 98 F (36.7 C) (!) 97.4 F (36.3 C) 98.5 F (36.9 C)  TempSrc:      SpO2: 98% 97% 98% 100%  Weight:      Height:        Intake/Output Summary (Last 24 hours) at 08/31/2023 0942 Last data filed at 08/31/2023 0521 Gross per 24 hour  Intake 921.83 ml  Output 2350 ml  Net -1428.17 ml     Filed Weights   08/21/23 1326  Weight: 68.9 kg    Examination: General exam: Appears calm and comfortable, NAD  Respiratory system: No work of breathing, symmetric chest wall expansion Cardiovascular system: S1 & S2 heard, RRR.  Gastrointestinal system: Abdomen is nondistended, soft and nontender.  Neuro:  drowsy but arousable, oriented only to self, does not answer questions consistently, requires significant prompting. Extremities: Symmetric, expected ROM Skin: No rashes, lesions Psychiatry: Mood & affect appropriate for situation.   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, L1-2   Psoas abscess (HCC)   Chronic diastolic CHF (congestive heart failure) (HCC)   Aspiration pneumonia (HCC)   Acute metabolic encephalopathy   Hypertension   Thrombocytopenia (HCC)   Aortic atherosclerosis (HCC)   Hyperthyroidism   AAA (abdominal aortic aneurysm) (HCC)   COPD (chronic obstructive pulmonary disease) (HCC)   Bipolar disorder (HCC)   PVD (peripheral vascular disease) (HCC)   Urinary tract infection   Dementia without behavioral disturbance (HCC)   Discitis of lumbar region   Fungal infection of toenail   Hypotension    Severe sepsis - Criteria met on arrival with: Hypotension, tachycardia, leukocytosis, acute metabolic encephalopathy, lactic acidosis, discitis and osteomyelitis - Antibiotics as below  Discitis and osteomyelitis of lumbar region, L1-2 - Infectious disease consulted - Status post discitis culture with IR 2/2 - Culture growing ESBL E. coli - On Merrem now, vancomycin has been discontinued.  Will need once a day ertapenem for 6 weeks at discharge (end date 10/03/23) -- Have ordered for PICC placement  Right psoas abscess - Interventional radiology unable to get culture of this area - Antibiotics as above  History of right  hip repair November/2024 - Complicated by right hip cellulitis at that time - Appears stable at this time  Aspiration pneumonia - Currently on Merrem and vancomycin - On dysphagia 2 diet  Chronic diastolic heart failure - Euvolemic, hold diuretics for now  Acute metabolic encephalopathy - Secondary to sepsis - Appears at baseline  now  Hypertension - Continue Norvasc  Thrombocytopenia, resolved - Secondary to sepsis - Trend CBC  Hyperthyroidism - Continue methimazole  Abdominal aortic aneurysm Aortic atherosclerosis - Continue statin  Peripheral vascular disease - Needs vascular follow-up outpatient - Continue Plavix and statin for now - Has aspirin allergy, noted  Bipolar disorder Schizophrenia Dementia without behavioral disturbance - Continue home meds Risperdal, Celexa, fluphenazine COPD - Not currently in exacerbation - As needed DuoNebs  Urinary tract infection, ruled out.  Urine culture negative  Fungal infection of toenail Toenail overgrowth - Podiatry completed bedside debridement 2/4  Normocytic anemia - Stable, trend  Right Renal Lower Pole Cystic Lesion -Incidentally seen on CT, 2.4 x 2.4, neoplasm is possible - MRI w/wo: Concerning for renal cell carcinoma. Urology consulted, recommends outpatient follow-up  Physical deconditioning PT score: 10 OT score: 16 - Needs SNF at discharge.  TOC consulted  DVT prophylaxis Lovenox    Code Status: Full Code Family Communication: Discussed directly with patient Disposition:  Status is: Inpatient, patient is medically cleared 2/5. Pending SNF arrangements.  Consultants:    Treatment Team:  Consulting Physician: Lynn Ito, MD Consulting Physician: Rosetta Posner, DPM Consulting Physician: Loletta Parish., MD  Procedures:    Antimicrobials:  Anti-infectives (From admission, onward)    Start     Dose/Rate Route Frequency Ordered Stop   08/31/23 1400  ertapenem (INVANZ) 1 g in sodium chloride 0.9 % 100 mL IVPB        1 g 200 mL/hr over 30 Minutes Intravenous Daily 08/31/23 0805     08/23/23 2130  meropenem (MERREM) 1 g in sodium chloride 0.9 % 100 mL IVPB  Status:  Discontinued        1 g 200 mL/hr over 30 Minutes Intravenous Every 8 hours 08/23/23 2037 08/31/23 0805   08/22/23 1800  vancomycin (VANCOREADY)  IVPB 1750 mg/350 mL  Status:  Discontinued        1,750 mg 175 mL/hr over 120 Minutes Intravenous Every 24 hours 08/22/23 1006 08/24/23 1530   08/21/23 0700  vancomycin (VANCOCIN) IVPB 1000 mg/200 mL premix  Status:  Discontinued        1,000 mg 200 mL/hr over 60 Minutes Intravenous Every 12 hours 08/21/23 0400 08/22/23 1006   08/21/23 0415  vancomycin (VANCOCIN) 750 mg in sodium chloride 0.9 % 250 mL IVPB  Status:  Discontinued        750 mg 250 mL/hr over 60 Minutes Intravenous  Once 08/21/23 0402 08/21/23 0642   08/21/23 0400  vancomycin (VANCOREADY) IVPB 750 mg/150 mL  Status:  Discontinued        750 mg 150 mL/hr over 60 Minutes Intravenous  Once 08/21/23 0352 08/21/23 0800   08/21/23 0345  ceFEPIme (MAXIPIME) 2 g in sodium chloride 0.9 % 100 mL IVPB  Status:  Discontinued        2 g 200 mL/hr over 30 Minutes Intravenous Every 8 hours 08/21/23 0340 08/23/23 2011   08/20/23 1645  ceFEPIme (MAXIPIME) 2 g in sodium chloride 0.9 % 100 mL IVPB        2 g 200 mL/hr over 30 Minutes Intravenous  Once 08/20/23 1641  08/20/23 1844   08/20/23 1645  vancomycin (VANCOCIN) IVPB 1000 mg/200 mL premix        1,000 mg 200 mL/hr over 60 Minutes Intravenous  Once 08/20/23 1641 08/20/23 2157       Data Reviewed: I have personally reviewed following labs and imaging studies CBC: Recent Labs  Lab 08/25/23 0601 08/30/23 1410  WBC 5.3 6.3  NEUTROABS  --  4.1  HGB 10.8* 11.5*  HCT 33.7* 34.6*  MCV 85.1 85.0  PLT 149* 184   Basic Metabolic Panel: Recent Labs  Lab 08/25/23 0601 08/30/23 1410  NA 133* 134*  K 3.7 4.0  CL 101 98  CO2 24 26  GLUCOSE 82 109*  BUN 11 14  CREATININE 0.56* 0.59*  CALCIUM 9.2 9.4   GFR: Estimated Creatinine Clearance: 75.3 mL/min (A) (by C-G formula based on SCr of 0.59 mg/dL (L)). Liver Function Tests: Recent Labs  Lab 08/30/23 1410  AST 17  ALT 13  ALKPHOS 101  BILITOT 0.7  PROT 7.3  ALBUMIN 3.0*    CBG: No results for input(s): "GLUCAP" in the  last 168 hours.  Recent Results (from the past 240 hours)  Aerobic/Anaerobic Culture w Gram Stain (surgical/deep wound)     Status: None   Collection Time: 08/22/23  9:00 AM   Specimen: Body Fluid  Result Value Ref Range Status   Specimen Description   Final    FLUID Performed at [facility], [address] [place], [place] [address]    Special Requests   Final    NONE Performed at [facility], [address] [place], [place] [address]    Gram Stain   Final    FEW WBC PRESENT, PREDOMINANTLY PMN NO ORGANISMS SEEN    Culture   Final    RARE ESCHERICHIA COLI Confirmed Extended Spectrum Beta-Lactamase Producer (ESBL).  In bloodstream infections from ESBL organisms, carbapenems are preferred over piperacillin/tazobactam. They are shown to have a lower risk of mortality. NO ANAEROBES ISOLATED Performed at [facility], [address] [place], [place] [address]    Report Status 08/27/2023 FINAL  Final   Organism ID, Bacteria ESCHERICHIA COLI  Final      Susceptibility   Escherichia coli - MIC*    AMPICILLIN >=32 RESISTANT Resistant     CEFEPIME >=32 RESISTANT Resistant     CEFTAZIDIME RESISTANT Resistant     CEFTRIAXONE >=64 RESISTANT Resistant     CIPROFLOXACIN >=4 RESISTANT Resistant     GENTAMICIN <=1 SENSITIVE Sensitive     IMIPENEM <=0.25 SENSITIVE Sensitive     TRIMETH/SULFA <=20 SENSITIVE Sensitive     AMPICILLIN/SULBACTAM >=32 RESISTANT Resistant     PIP/TAZO <=4 SENSITIVE Sensitive ug/mL    * RARE ESCHERICHIA COLI     Radiology Studies: No results found.   Scheduled Meds:  amLODipine  5 mg Oral Daily   ammonium lactate   Topical BID   atorvastatin  40 mg Oral QPM   citalopram  40 mg Oral Daily   clopidogrel  75 mg Oral Daily   enoxaparin (LOVENOX) injection  40 mg Subcutaneous Q24H   ferrous sulfate  325 mg Oral Q breakfast   fluPHENAZine  5 mg Oral Daily   guaiFENesin  600 mg Oral BID   methimazole  5 mg Oral TID   mupirocin  cream   Topical Daily   risperiDONE  1 mg Oral QHS   tamsulosin  0.4 mg Oral QPC supper   Continuous Infusions:  ertapenem       LOS: 11 days    Time spent:   Debarah Crape, DO [facility]  To contact the attending physician between 7A-7P please use Epic Chat. To contact the covering physician during after hours 7P-7A, please review Amion.   08/31/2023, 9:42 AM   *This document has been created with the assistance of dictation software. Please excuse typographical errors. *

## 2023-08-31 NOTE — Plan of Care (Signed)

## 2023-08-31 NOTE — Progress Notes (Signed)
Occupational Therapy Treatment Patient Details Name: [name] MRN: [id number] DOB: July 17, 1951 Today's Date: 08/31/2023   History of present illness 73 year old man past medical history of dementia, bipolar disorder, hypertension, COPD, diastolic congestive heart failure, stroke, hypothyroidism, ESBL UTI and history of right hip fracture with local cellulitis in November 2024.  In the ER he was hypotensive and tachycardic with an elevated white count and lactic acid.   OT comments  [name] was seen for OT treatment on this date. Upon arrival to room pt reclined in bed with condom catheter removed and bed wet - pt agreeable to tx. Imporved command following noted this session. Pt requires MIN A exit bed, fair sitting balance with posterior lean. MOD A + RW sit<>stand x3 trials, heavy posterior lean. MIN A don/doff gown in sitting. RN notified of clean linen change. Pt making good progress toward goals, will continue to follow POC. Discharge recommendation remains appropriate.        If plan is discharge home, recommend the following:  A lot of help with bathing/dressing/bathroom;A lot of help with walking and/or transfers   Equipment Recommendations  BSC/3in1    Recommendations for Other Services      Precautions / Restrictions Precautions Precautions: Fall Restrictions Weight Bearing Restrictions Per Provider Order: No       Mobility Bed Mobility Overal bed mobility: Needs Assistance Bed Mobility: Supine to Sit, Sit to Supine     Supine to sit: Min assist Sit to supine: Supervision        Transfers Overall transfer level: Needs assistance Equipment used: Rolling walker (2 wheels) Transfers: Sit to/from Stand Sit to Stand: Mod assist                 Balance Overall balance assessment: Needs assistance Sitting-balance support: Feet supported, Bilateral upper extremity supported Sitting balance-Leahy Scale: Fair     Standing balance support: Bilateral  upper extremity supported, Reliant on assistive device for balance, During functional activity Standing balance-Leahy Scale: Poor                             ADL either performed or assessed with clinical judgement   ADL Overall ADL's : Needs assistance/impaired                                       General ADL Comments: MOD A + RW for simulated BSC t/f. MIN A don/doff gown in sitting.      Cognition Arousal: Alert Behavior During Therapy: Flat affect Cognition: History of cognitive impairments                               Following commands: Intact                      Pertinent Vitals/ Pain       Pain Assessment Pain Assessment: No/denies pain   Frequency  Min 1X/week        Progress Toward Goals  OT Goals(current goals can now be found in the care plan section)  Progress towards OT goals: Progressing toward goals  Acute Rehab OT Goals OT Goal Formulation: With patient Time For Goal Achievement: 09/06/23 Potential to Achieve Goals: Good ADL Goals Pt Will Perform Grooming: with min assist;sitting Pt Will Perform Lower Body Dressing:  with min assist;sitting/lateral leans Pt Will Transfer to Toilet: with min assist;stand pivot transfer;bedside commode  Plan      Co-evaluation                 AM-PAC OT "6 Clicks" Daily Activity     Outcome Measure   Help from another person eating meals?: A Lot Help from another person taking care of personal grooming?: A Lot Help from another person toileting, which includes using toliet, bedpan, or urinal?: A Lot Help from another person bathing (including washing, rinsing, drying)?: A Lot Help from another person to put on and taking off regular upper body clothing?: A Little Help from another person to put on and taking off regular lower body clothing?: A Lot 6 Click Score: 13    End of Session    OT Visit Diagnosis: Muscle weakness (generalized) (M62.81);Other  symptoms and signs involving cognitive function   Activity Tolerance Patient tolerated treatment well   Patient Left in bed;with call bell/phone within reach;with bed alarm set   Nurse Communication Mobility status        Time: 7829-5621 OT Time Calculation (min): 20 min  Charges: OT General Charges $OT Visit: 1 Visit OT Treatments $Self Care/Home Management : 8-22 mins  Kathie Dike, M.S. OTR/L  08/31/23, 4:07 PM  ascom [phone]

## 2023-08-31 NOTE — TOC Progression Note (Signed)
Transition of Care [facility]) - Progression Note    Patient Details  Name: [name] MRN: [id number] Date of Birth: 1951-07-16  Transition of Care [employer]) CM/SW Contact  Allena Katz, LCSW Phone Number: 08/31/2023, 3:42 PM  Clinical Narrative:   [facility] started for IV infusion and PT for [facility].         Expected Discharge Plan and Services                                               Social Determinants of Health (SDOH) Interventions SDOH Screenings   Food Insecurity: Patient Unable To Answer (08/23/2023)  Housing: Patient Unable To Answer (08/23/2023)  Transportation Needs: Patient Unable To Answer (08/23/2023)  Utilities: Patient Unable To Answer (08/23/2023)  Depression (PHQ2-9): Low Risk  (12/12/2020)  Social Connections: Patient Unable To Answer (08/23/2023)  Tobacco Use: High Risk (08/20/2023)    Readmission Risk Interventions    05/09/2023    4:34 PM  Readmission Risk Prevention Plan  Transportation Screening Complete  PCP or Specialist Appt within 5-7 Days Complete  Home Care Screening Complete  Medication Review (RN CM) Complete

## 2023-09-01 DIAGNOSIS — R6520 Severe sepsis without septic shock: Secondary | ICD-10-CM | POA: Diagnosis not present

## 2023-09-01 DIAGNOSIS — A419 Sepsis, unspecified organism: Secondary | ICD-10-CM | POA: Diagnosis not present

## 2023-09-01 MED ORDER — CLOPIDOGREL BISULFATE 75 MG PO TABS: 75.0000 mg | ORAL_TABLET | Freq: Every day | ORAL | 0 refills | Status: AC

## 2023-09-01 MED ORDER — ERTAPENEM IV (FOR PTA / DISCHARGE USE ONLY): 1.0000 g | INTRAVENOUS | 0 refills | Status: AC

## 2023-09-01 MED ORDER — AMLODIPINE BESYLATE 5 MG PO TABS: 5.0000 mg | ORAL_TABLET | Freq: Every day | ORAL | 1 refills | Status: AC

## 2023-09-01 MED ORDER — AMMONIUM LACTATE 12 % EX LOTN: TOPICAL_LOTION | Freq: Two times a day (BID) | CUTANEOUS | 0 refills | Status: AC

## 2023-09-01 NOTE — Plan of Care (Signed)
  Problem: Respiratory: Goal: Ability to maintain adequate ventilation will improve Outcome: Progressing   Problem: Pain Managment: Goal: General experience of comfort will improve and/or be controlled Outcome: Progressing

## 2023-09-01 NOTE — TOC Transition Note (Signed)
Transition of Care [facility]) - Discharge Note   Patient Details  Name: [name] MRN: [id number] Date of Birth: 28-Nov-1950  Transition of Care [facility]) CM/SW Contact:  Allena Katz, LCSW Phone Number: 09/01/2023, 9:59 AM   Clinical Narrative:   [facility]. RN given number for report. Dc summary to be sent once in. Medical necessity printed to unit.    Final next level of care: Skilled Nursing Facility Barriers to Discharge: Barriers Resolved   Patient Goals and CMS Choice Patient states their goals for this hospitalization and ongoing recovery are:: [facility] CMS Medicare.gov Compare Post Acute Care list provided to:: Patient Represenative (must comment) (nephew timmy)        Discharge Placement PASRR number recieved: 09/01/23            Patient [facility] bed at: [facility] [place] Patient to be transferred to facility by: [facility] Name of family member notified: timmy Patient and family notified of of transfer: 09/01/23  Discharge Plan and Services Additional resources added to the After Visit Summary for                                       Social Drivers of Health (SDOH) Interventions SDOH Screenings   Food Insecurity: Patient Unable To Answer (08/23/2023)  Housing: Patient Unable To Answer (08/23/2023)  Transportation Needs: Patient Unable To Answer (08/23/2023)  Utilities: Patient Unable To Answer (08/23/2023)  Depression (PHQ2-9): Low Risk  (12/12/2020)  Social Connections: Patient Unable To Answer (08/23/2023)  Tobacco Use: High Risk (08/20/2023)     Readmission Risk Interventions    05/09/2023    4:34 PM  Readmission Risk Prevention Plan  Transportation Screening Complete  PCP or Specialist Appt within 5-7 Days Complete  Home Care Screening Complete  Medication Review (RN CM) Complete

## 2023-09-01 NOTE — Discharge Summary (Addendum)
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 *  Hospital Course: Spurgeon Kall is a 73 y.o. male with medical history significant for dementia, bipolar disorder, hypertension, COPD, diastolic CHF, stroke, hypothyroidism, ESBL UTI, with history of right hip fracture with local cellulitis in November 2024, who presents from his group home with decreased responsiveness.  At baseline he is alert and oriented x 1.  Per triage note he was noted to have a strong smell of urine on arrival.  History is limited due to change in mentation. ED course and data review: Hypotensive and tachycardic on arrival at 84/53 and 115 with otherwise normal vitals.  BP responded to fluid boluses and improved to 160/87 by admission. Workup in the ED significant for the following: VBG unremarkable WBC 11,000 with lactic acid 1.3 Hemoglobin 12.6 with platelets 123000(baseline Hb around 10) Respiratory viral panel negative  for COVID flu and RSV Urinalysis consistent with UTI with positive nitrite and leukocyte esterase and many bacteria EKG, personally viewed and interpreted showing sinus tachycardia at 104 with LVH and LAFB CT head nonacute CT chest abdomen and pelvis with contrast concerning for osteomyelitis and possible discitis L1-L2, possible aspiration, cystic neoplasm kidney among other findings as further detailed below   IMPRESSION: Interval development of erosive changes surrounding the disc space at L1-2 with some fluid. Developing area of osteomyelitis and discitis is possible. Recommend further evaluation such as MRI with and without contrast.  Bilateral lower lobe lung parenchymal opacities, right-greater-than-left with debris along the course of the associated bronchi more on the right than left. Infiltrative process is possible. Please correlate for any clinical evidence aspiration.  No bowel obstruction, free air.  Scattered colonic stool.  Complex lesion as described previously along lower pole of the right kidney. A cystic neoplasm of the kidney is possible. Further workup is recommended when appropriate.  Extensive atherosclerotic changes identified diffusely. There is consent plaque and calcification along the aortic valve. 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 also a 3.2 cm in for abdominal aortic aneurysm. Previously 3.1 cm. Recommend follow-up ultrasound every 3 years. (Ref.: J Vasc Surg. 2018; 67:2-77 and J Am Coll Radiol 2013;10(10):789-794.)   Further evaluation with MRI confirmed the following:  Findings consistent with discitis-osteomyelitis at L1-L2 with 14 x 12 mm abscess in the right psoas muscle. No epidural fluid Collection   Patient treated with sepsis fluids, started on cefepime and vancomycin Hospitalist  consulted for admission.        Severe sepsis - Criteria met on  arrival with: Hypotension, tachycardia, leukocytosis, acute metabolic encephalopathy, lactic acidosis, discitis and osteomyelitis -Improved on antibiotic therapy   Discitis and osteomyelitis of lumbar region, L1-2 - Infectious disease consulted - Status post aspiration of disc space between L1 and L2. Culture with IR 2/2 - Culture growing ESBL E. coli - Will need once a day ertapenem for 6 weeks at discharge (end date 10/03/23) --   Right psoas abscess - Interventional radiology unable to get culture of this area - Continue empiric antibiotic therapy with ertapenem   History of right hip repair November/2024 - Complicated by right hip cellulitis at that time - Appears stable at this time   Aspiration pneumonia - Currently on Merrem  - On dysphagia 2 diet   Chronic diastolic heart failure - Euvolemic, hold diuretics for now   Acute metabolic encephalopathy - Secondary to sepsis - Appears at baseline now.  Patient awake, alert and oriented x 1   Hypertension - Continue Norvasc   Thrombocytopenia, resolved - Secondary to sepsis - Trend CBC   Hyperthyroidism - Continue methimazole   Abdominal aortic aneurysm Aortic atherosclerosis - Continue statin   Peripheral vascular disease - Needs vascular follow-up outpatient - Continue Plavix and statin for now - Has aspirin allergy, noted   Bipolar disorder Schizophrenia Dementia without behavioral disturbance - Continue home meds Risperdal, Celexa, fluphenazine COPD - Not currently in exacerbation - As needed DuoNebs   Urinary tract infection, ruled out.  Urine culture negative   Fungal infection of toenail Toenail overgrowth - Podiatry completed bedside debridement 2/4   Normocytic anemia - Stable, trend   Right Renal Lower Pole Cystic Lesion -Incidentally seen on CT, 2.4 x 2.4, neoplasm is possible - MRI w/wo: Concerning for renal cell carcinoma. Urology consulted, recommends outpatient follow-up   Physical  deconditioning PT score: 10 OT score: 16 - Needs SNF at discharge.  TOC consulted  Patient was seen and examined at the bedside and is in stable condition for discharge.  See vital signs below Called patient's nephew and left him a voicemail.  Awaiting callback.        Consultants: Infectious disease, interventional radiology, urology Procedures performed: Aspiration of disc space between L1 and L2 Disposition: Skilled nursing facility Diet recommendation:  Discharge Diet Orders (From admission, onward)     Start     Ordered   09/01/23 0000  Diet - low sodium heart healthy       Comments: Dysphagia 2 diet with thin liquids   09/01/23 0907           Dysphagia type 2 Thin Liquid DISCHARGE MEDICATION: Allergies as of 09/01/2023       Reactions   Asa [aspirin]    Hx GI bleed        Medication List     STOP taking these medications    Gerhardt's butt cream Crea   HYDROcodone-acetaminophen 5-325 MG tablet Commonly known as: NORCO/VICODIN   leptospermum manuka honey Pste paste   Vitamin D3 25 MCG (1000 UT) Caps       TAKE these medications    amLODipine 5 MG tablet Commonly known as: NORVASC Take 1 tablet (5 mg total) by mouth daily. Start taking on: September 02, 2023   ammonium lactate 12 % lotion Commonly known as: LAC-HYDRIN Apply topically 2 (two) times daily.   ascorbic acid 500 MG tablet Commonly known as: VITAMIN C Take 1  tablet (500 mg total) by mouth 2 (two) times daily.   citalopram 40 MG tablet Commonly known as: CELEXA Take 1 tablet (40 mg total) by mouth daily.   clopidogrel 75 MG tablet Commonly known as: PLAVIX Take 1 tablet (75 mg total) by mouth daily. Start taking on: September 02, 2023   ertapenem IVPB Commonly known as: INVANZ Inject 1 g into the vein daily. Indication:  ESBL E coli discitis/osteomyelitis and psoas abscess Last Day of Therapy:  10/03/2023 Labs - Once weekly:  CBC/D, CMP, ESR and CRP Fax weekly lab results   promptly to [phone] Method of administration: Mini-Bag Plus / Gravity Method of administration may be changed at the discretion of nursing facility and its pharmacy Please pull PIC at completion of IV antibiotics Call [phone] with any questions or critical value   feeding supplement Liqd Take 237 mLs by mouth 3 (three) times daily between meals.   ferrous sulfate 325 (65 FE) MG tablet Take 1 tablet (325 mg total) by mouth daily with breakfast.   fluPHENAZine 5 MG tablet Commonly known as: PROLIXIN Take 5 mg by mouth daily.   methimazole 5 MG tablet Commonly known as: TAPAZOLE Take 5 mg by mouth 3 (three) times daily.   metoprolol succinate 25 MG 24 hr tablet Commonly known as: TOPROL-XL Take 25 mg by mouth daily.   multivitamin with minerals Tabs tablet Take 1 tablet by mouth daily.   risperiDONE 1 MG tablet Commonly known as: RISPERDAL Take 1 tablet (1 mg total) by mouth daily.   senna 8.6 MG Tabs tablet Commonly known as: SENOKOT Take 1 tablet (8.6 mg total) by mouth 2 (two) times daily. What changed:  how much to take when to take this   simvastatin 20 MG tablet Commonly known as: ZOCOR TAKE 1 TABLET BY MOUTH DAILY   tamsulosin 0.4 MG Caps capsule Commonly known as: FLOMAX TAKE 1 CAPSULE BY MOUTH DAILY AFTER SUPPER What changed:  how much to take how to take this when to take this additional instructions   vitamin B-12 100 MCG tablet Commonly known as: CYANOCOBALAMIN Take 100 mcg by mouth daily.               Discharge Care Instructions  (From admission, onward)           Start     Ordered   09/01/23 0000  Change dressing on IV access line weekly and PRN  (Home infusion instructions - [facility] )        09/01/23 0907   09/01/23 0000  Discharge wound care:       Comments: Apply Bactroban ointment and Band-Aid to the right third toe daily until dry or discharge.   09/01/23 0272            Contact information  for follow-up providers     Koren Bound, NP Follow up.   Specialty: Nurse Practitioner Why: Hospital follow up Contact information: [address] [place] [facility] [address] [phone]         Annice Needy, MD Follow up in 2 week(s).   Specialties: Vascular Surgery, Radiology, Interventional Cardiology Contact information: [address] [place] [address] [phone]              Contact information for after-discharge care     Destination     [facility] Preferred SNF .   Service: Skilled Paramedic information: [address] [place]  [address] [phone]                    Discharge Exam: Ceasar Mons Weights   08/21/23 1326  Weight: 68.9 kg   General: Awake, alert, oriented to self Lungs: Bilateral air entry Heart: Irregular  abdomen: Soft, non-tender,not distended. Bowel sounds normal. No masses Extremities: atraumatic, no cyanosis. No edema. No clubbing Skin: No rashes or lesions. Or bruising Lymph: Cervical, supraclavicular normal. Neurologic: Grossly non-focal      Condition at discharge: stable  The results of significant diagnostics from this hospitalization (including imaging, microbiology, ancillary and laboratory) are listed below for reference.   Imaging Studies: [place] EKG SITE RITE Result Date: 08/31/2023 If Site Rite image not attached, placement could not be confirmed due to current cardiac rhythm.  MR ABDOMEN W WO CONTRAST Result Date: 08/26/2023 CLINICAL DATA:  Right renal lesion concerning for malignancy, characterize EXAM: MRI ABDOMEN WITHOUT AND WITH CONTRAST TECHNIQUE: Multiplanar multisequence MR imaging of the abdomen was performed both before and after the administration of intravenous contrast. CONTRAST:  6mL GADAVIST GADOBUTROL 1 MMOL/ML IV SOLN COMPARISON:  CT chest abdomen pelvis, 08/20/2023 FINDINGS: Examination is very limited by pervasive breath  motion artifact throughout, particularly multiphasic contrast enhanced sequences. Within this limitation, Lower chest: No acute abnormality. Hepatobiliary: No solid liver abnormality is seen. No gallstones, gallbladder wall thickening, or biliary dilatation. Pancreas: Unremarkable. No pancreatic ductal dilatation or surrounding inflammatory changes. Spleen: Normal in size without significant abnormality. Adrenals/Urinary Tract: Adrenal glands are unremarkable. Evaluation of the kidneys is significantly limited breath motion artifact as above. Within this limitation, heterogeneously enhancing mixed solid and cystic lesion of the posterior inferior pole of the right kidney measuring 2.3 x 2.0 cm (series 20, image 42) additional simple, benign nonenhancing renal cortical cysts, for which no further follow-up or characterization is required. Stomach/Bowel: Stomach is within normal limits. No evidence of bowel wall thickening, distention, or inflammatory changes. Vascular/Lymphatic: Aortic atherosclerosis. No enlarged abdominal lymph nodes. Other: No abdominal wall hernia or abnormality. No ascites. Musculoskeletal: Rim enhancement and endplate destruction of the L1-L2 disc space (series 22, image 27). IMPRESSION: 1. Examination is very limited by pervasive breath motion artifact throughout, particularly multiphasic contrast enhanced sequences. 2. Within this limitation, heterogeneously enhancing mixed solid and cystic lesion of the posterior inferior pole of the right kidney measuring 2.3 x 2.0 cm, consistent with a small renal cell carcinoma. 3. No evidence of lymphadenopathy or metastatic disease in the abdomen. 4. Rim enhancement and endplate destruction of the L1-L2 disc space, consistent with discitis/osteomyelitis, better assessed by prior dedicated imaging of the lumbar spine. Electronically Signed   By: Jearld Lesch M.D.   On: 08/26/2023 21:03   CT ASPIRATION N/S Result Date: 08/22/2023 INDICATION:  73 year old with evidence of L1-L2 discitis on MRI. There is also a small fluid collection in the right psoas muscle based on MRI. Request for aspiration of the psoas abscess or disc fluid. EXAM: 1. CT-guided aspiration of right psoas muscle. 2. CT-guided aspiration of L1-L2 disc MEDICATIONS: Moderate sedation ANESTHESIA/SEDATION: Moderate (conscious) sedation was employed during this procedure. A total of Versed 1 mg and Fentanyl 37.5 mcg was administered intravenously by the radiology nurse. Total intra-service moderate Sedation Time: 39 minutes. The patient's level of consciousness and vital signs were monitored continuously by radiology nursing throughout the procedure under my direct supervision. COMPLICATIONS: None immediate. PROCEDURE: Informed consent was obtained from the patient's caregiver. Time-out was performed. Patient was placed on his left side. CT images through  the abdomen were obtained. The right side of the back was prepped with chlorhexidine and sterile field was created. Skin was anesthetized using 1% lidocaine. Small incision was made. Using CT guidance, 18 gauge trocar needle was directed into the right psoas muscle. Needle was directed into multiple areas in the right psoas muscle but no purulent fluid could be obtained. Only small drops of bloody fluid were collected. Attention was directed to the L1-L2 disc space. Skin was anesthetized with 1% lidocaine. Using CT guidance, a 20 gauge spinal needle was directed into the right side of the L1-L2 disc. Needle position was confirmed within the lateral aspect of the disc. 1 mL bloody fluid was aspirated. FINDINGS: It was difficult to identify a discrete fluid collection in the right psoas. Needle was directed in the anticipated area of the fluid collection based on the previous MRI. No significant purulent fluid could be aspirated. Spinal needle was successfully placed into the lateral aspect of the L1-L2 disc space. 1 mL of bloody fluid was  aspirated. IMPRESSION: 1. CT-guided aspiration of the L1-L2 disc. 1 mL of bloody fluid was collected from the disc space. 2. CT-guided aspiration of the right psoas muscle only yielded a few drops of blood. Electronically Signed   By: Richarda Overlie M.D.   On: 08/22/2023 17:05   MR Lumbar Spine W Wo Contrast Result Date: 08/20/2023 CLINICAL DATA:  Back pain with infection suspected. EXAM: MRI THORACIC AND LUMBAR SPINE WITHOUT AND WITH CONTRAST TECHNIQUE: Multiplanar and multiecho pulse sequences of the thoracic and lumbar spine were obtained without and with intravenous contrast. CONTRAST:  7mL GADAVIST GADOBUTROL 1 MMOL/ML IV SOLN COMPARISON:  None Available. FINDINGS: MRI THORACIC SPINE FINDINGS Alignment:  Physiologic. Vertebrae: No fracture, evidence of discitis, or bone lesion. Cord:  Normal signal and morphology. Paraspinal and other soft tissues: Negative. Disc levels: No spinal canal stenosis. MRI LUMBAR SPINE FINDINGS Segmentation:  Standard Alignment:  Physiologic. Vertebrae: There is abnormal signal of the bone marrow at L1 and L2 with edema and contrast enhancement of the disc space. No epidural fluid collection. Conus medullaris: Extends to the L1 level and appears normal. Paraspinal and other soft tissues: Fluid collection within the right psoas muscle at the L1-2 level measuring 14 x 12 mm. Disc levels: L1-L2: Edema within the disc space as above. No spinal canal stenosis. Mild severe bilateral neural foraminal stenosis. L2-L3: Small disc bulge. No spinal canal stenosis. No neural foraminal stenosis. L3-L4: Small disc bulge. Narrowing of both lateral recesses without central spinal canal stenosis. No neural foraminal stenosis. L4-L5: Intermediate sized right asymmetric disc bulge. Right lateral recess narrowing without central spinal canal stenosis. No neural foraminal stenosis. L5-S1: Small right subarticular disc protrusion with annular fissure. Right lateral recess narrowing without central spinal  canal stenosis. No neural foraminal stenosis. Visualized sacrum: Normal. IMPRESSION: 1. Findings consistent with discitis-osteomyelitis at L1-L2 with 14 x 12 mm abscess in the right psoas muscle. No epidural fluid collection. 2. Mild-to-moderate bilateral L1-L2 neural foraminal stenosis. 3. Narrowing of the lateral recesses bilaterally at L3-L4, on the right at L4-L5 and on the right at L5-S1. 4. No spinal canal stenosis. Electronically Signed   By: Deatra Robinson M.D.   On: 08/20/2023 21:48   MR THORACIC SPINE W WO CONTRAST Result Date: 08/20/2023 CLINICAL DATA:  Back pain with infection suspected. EXAM: MRI THORACIC AND LUMBAR SPINE WITHOUT AND WITH CONTRAST TECHNIQUE: Multiplanar and multiecho pulse sequences of the thoracic and lumbar spine were obtained without and with intravenous  contrast. CONTRAST:  7mL GADAVIST GADOBUTROL 1 MMOL/ML IV SOLN COMPARISON:  None Available. FINDINGS: MRI THORACIC SPINE FINDINGS Alignment:  Physiologic. Vertebrae: No fracture, evidence of discitis, or bone lesion. Cord:  Normal signal and morphology. Paraspinal and other soft tissues: Negative. Disc levels: No spinal canal stenosis. MRI LUMBAR SPINE FINDINGS Segmentation:  Standard Alignment:  Physiologic. Vertebrae: There is abnormal signal of the bone marrow at L1 and L2 with edema and contrast enhancement of the disc space. No epidural fluid collection. Conus medullaris: Extends to the L1 level and appears normal. Paraspinal and other soft tissues: Fluid collection within the right psoas muscle at the L1-2 level measuring 14 x 12 mm. Disc levels: L1-L2: Edema within the disc space as above. No spinal canal stenosis. Mild severe bilateral neural foraminal stenosis. L2-L3: Small disc bulge. No spinal canal stenosis. No neural foraminal stenosis. L3-L4: Small disc bulge. Narrowing of both lateral recesses without central spinal canal stenosis. No neural foraminal stenosis. L4-L5: Intermediate sized right asymmetric disc bulge.  Right lateral recess narrowing without central spinal canal stenosis. No neural foraminal stenosis. L5-S1: Small right subarticular disc protrusion with annular fissure. Right lateral recess narrowing without central spinal canal stenosis. No neural foraminal stenosis. Visualized sacrum: Normal. IMPRESSION: 1. Findings consistent with discitis-osteomyelitis at L1-L2 with 14 x 12 mm abscess in the right psoas muscle. No epidural fluid collection. 2. Mild-to-moderate bilateral L1-L2 neural foraminal stenosis. 3. Narrowing of the lateral recesses bilaterally at L3-L4, on the right at L4-L5 and on the right at L5-S1. 4. No spinal canal stenosis. Electronically Signed   By: Deatra Robinson M.D.   On: 08/20/2023 21:48   CT CHEST ABDOMEN PELVIS W CONTRAST Result Date: 08/20/2023 CLINICAL DATA:  Sepsis EXAM: CT CHEST, ABDOMEN, AND PELVIS WITH CONTRAST TECHNIQUE: Multidetector CT imaging of the chest, abdomen and pelvis was performed following the standard protocol during bolus administration of intravenous contrast. RADIATION DOSE REDUCTION: This exam was performed according to the departmental dose-optimization program which includes automated exposure control, adjustment of the mA and/or kV according to patient size and/or use of iterative reconstruction technique. CONTRAST:  OMNIPAQUE IOHEXOL 300 MG/ML  SOLN COMPARISON:  Chest x-ray 08/20/2023 and older. Abdomen pelvis CT 03/18/2023. FINDINGS: CT CHEST FINDINGS Cardiovascular: Heart is nonenlarged. No pericardial effusion. Coronary artery calcifications are seen. Is also calcifications along the aortic valve. The thoracic aorta overall has a normal course and caliber with scattered atherosclerotic calcified plaque. There is a bovine type aortic arch, normal variant. Mediastinum/Nodes: Slightly heterogeneous thyroid gland. Grossly normal course and caliber to the esophagus. No abnormal lymph node enlargement seen in the left axillary region, hilum or mediastinum.  In the right axillary region is a presumed abnormal lymph node measuring 4.2 by 2.9 cm. This would have a differential. Please correlate with clinical findings and additional workup is recommended when appropriate if there is no prior. Lungs/Pleura: Breathing motion. Centrilobular emphysematous changes are seen. There is some paraseptal changes at the apices with bullous/bleb formation as well. Bilateral lower lobe parenchymal opacities are seen, right-greater-than-left. There is significant debris in the right lower lobe bronchi. Please correlate for etiology. Please correlate for any history of aspiration. Musculoskeletal: Scattered degenerative changes along the spine. There is an area of irregular endplate at the disc space at L1-2. Both endplates are involved. Mild adjacent edema. This was not seen on the prior examination and with the change would be worrisome for etiology such as discitis and osteomyelitis. Recommend further evaluation. CT ABDOMEN PELVIS FINDINGS Hepatobiliary:  No focal liver abnormality is seen. No gallstones, gallbladder wall thickening, or biliary dilatation. Patent portal vein. Pancreas: Unremarkable. No pancreatic ductal dilatation or surrounding inflammatory changes. Spleen: Normal in size without focal abnormality. Adrenals/Urinary Tract: Adrenal glands are preserved. Mild bilateral renal atrophy. There are some benign bilateral renal cystic foci identified. There also is a complex lesion along the lower pole of the right kidney which has mixed densities as well as some calcification. This lesion has diameter approaching 2.4 x 2.4 cm on series 2, image 79. On previous examination this measured 2.4 by 2.4 cm. Cystic neoplasm of the kidney is possible. Additional calcified lesion more superiorly is more benign on series 2, image 69. contracted urinary bladder. Bladder wall slightly thickened. Stomach/Bowel: On this non oral contrast exam large bowel has a normal course and caliber with  scattered colonic stool. Small bowel is nondilated. Stomach is distended with air and fluid. Vascular/Lymphatic: Extensive vascular calcifications. There is some noncalcified plaque as well along the aorta and branch vessels. Diameter of the inferior abdominal aorta is dilated at 3.2 x 2.9 cm today. Previously 3.1 cm. Additional dilatation of the common iliac arteries. On the right measuring up to 2.1 cm and left 1.8 cm. Significant plaque as well along the common iliac artery bifurcation on the left with potential significant stenosis. Please correlate with claudication symptoms. No specific abnormal lymph node enlargement identified in the abdomen and pelvis. There is also occlusion of the right SFA and severe disease along the left. Reproductive: Prostate is unremarkable. Other: Question high-riding testicles along the inguinal canal regions. Please correlate with physical exam. Anasarca. No free air or free fluid. Evaluation limited by motion as well as streak artifact as the arms were scanned at the patient's side. Musculoskeletal: Streak artifact related to the dynamic right hip screw. Curvature of the spine with degenerative changes. Again please see above discussion regarding the potential area of discitis and osteomyelitis at L1-2. IMPRESSION: Interval development of erosive changes surrounding the disc space at L1-2 with some fluid. Developing area of osteomyelitis and discitis is possible. Recommend further evaluation such as MRI with and without contrast. Bilateral lower lobe lung parenchymal opacities, right-greater-than-left with debris along the course of the associated bronchi more on the right than left. Infiltrative process is possible. Please correlate for any clinical evidence aspiration. No bowel obstruction, free air.  Scattered colonic stool. Complex lesion as described previously along lower pole of the right kidney. A cystic neoplasm of the kidney is possible. Further workup is recommended  when appropriate. Extensive atherosclerotic changes identified diffusely. There is consent plaque and calcification along the aortic valve. 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 also a 3.2 cm in for abdominal aortic aneurysm. Previously 3.1 cm. Recommend follow-up ultrasound every 3 years. (Ref.: J Vasc Surg. 2018; 67:2-77 and J Am Coll Radiol 2013;10(10):789-794.) Additional dilatation of the common iliac arteries. Evaluation limited by motion and streak artifact. Emphysematous lung changes. Critical Value/emergent results were called by telephone at the time of interpretation on 08/20/2023 at 6:12 pm to provider [facility] , who verbally acknowledged these results. Electronically Signed   By: Karen Kays M.D.   On: 08/20/2023 18:15   CT Head Wo Contrast Result Date: 08/20/2023 CLINICAL DATA:  Mental status change EXAM: CT HEAD WITHOUT CONTRAST TECHNIQUE: Contiguous axial images were obtained from the base of the skull through the vertex without intravenous contrast. RADIATION DOSE  REDUCTION: This exam was performed according to the departmental dose-optimization program which includes automated exposure control, adjustment of the mA and/or kV according to patient size and/or use of iterative reconstruction technique. COMPARISON:  In 1724 FINDINGS: Brain: Stable chronic small-vessel ischemic changes throughout the periventricular white matter, pons, and right basal ganglia. No evidence of acute infarct or hemorrhage. Lateral ventricles and remaining midline structures are stable. No acute extra-axial fluid collections. No mass effect. Vascular: No hyperdense vessel or unexpected calcification. Skull: Normal. Negative for fracture or focal lesion. Sinuses/Orbits: No acute finding. Other: None. IMPRESSION: 1. No acute intracranial process. 2. Stable chronic small-vessel ischemic changes as above. Electronically Signed    By: Sharlet Salina M.D.   On: 08/20/2023 17:18   DG Chest 2 View Result Date: 08/20/2023 CLINICAL DATA:  Suspected Sepsis.  Altered mental status. EXAM: CHEST - 2 VIEW COMPARISON:  05/07/2023. FINDINGS: There is left retrocardiac vertically oriented opacity, which may represent fibrosis/scarring versus atelectasis. Bilateral lung fields are otherwise clear. No acute consolidation or lung collapse. Bilateral costophrenic angles are clear. Normal cardio-mediastinal silhouette. No acute osseous abnormalities. The soft tissues are within normal limits. IMPRESSION: No active cardiopulmonary disease. Electronically Signed   By: Jules Schick M.D.   On: 08/20/2023 16:53    Microbiology: Results for orders placed or performed during the hospital encounter of 08/20/23  Resp panel by RT-PCR (RSV, Flu A&B, Covid) Anterior Nasal Swab     Status: None   Collection Time: 08/20/23  5:19 PM   Specimen: Anterior Nasal Swab  Result Value Ref Range Status   SARS Coronavirus 2 by RT PCR NEGATIVE NEGATIVE Final    Comment: (NOTE) SARS-CoV-2 target nucleic acids are NOT DETECTED.  The SARS-CoV-2 RNA is generally detectable in upper respiratory specimens during the acute phase of infection. The lowest concentration of SARS-CoV-2 viral copies this assay can detect is 138 copies/mL. A negative result does not preclude SARS-Cov-2 infection and should not be used as the sole basis for treatment or other patient management decisions. A negative result may occur with  improper specimen collection/handling, submission of specimen other than nasopharyngeal swab, presence of viral mutation(s) within the areas targeted by this assay, and inadequate number of viral copies(<138 copies/mL). A negative result must be combined with clinical observations, patient history, and epidemiological information. The expected result is Negative.  Fact Sheet for Patients:  BloggerCourse.com  Fact Sheet for  Healthcare Providers:  SeriousBroker.it  This test is no t yet approved or cleared by the [place] FDA and  has been authorized for detection and/or diagnosis of SARS-CoV-2 by FDA under an Emergency Use Authorization (EUA). This EUA will remain  in effect (meaning this test can be used) for the duration of the COVID-19 declaration under Section 564(b)(1) of the Act, 21 U.S.C.section 360bbb-3(b)(1), unless the authorization is terminated  or revoked sooner.       Influenza A by PCR NEGATIVE NEGATIVE Final   Influenza B by PCR NEGATIVE NEGATIVE Final    Comment: (NOTE) The Xpert Xpress SARS-CoV-2/FLU/RSV plus assay is intended as an aid in the diagnosis of influenza from Nasopharyngeal swab specimens and should not be used as a sole basis for treatment. Nasal washings and aspirates are unacceptable for Xpert Xpress SARS-CoV-2/FLU/RSV testing.  Fact Sheet for Patients: BloggerCourse.com  Fact Sheet for Healthcare Providers: SeriousBroker.it  This test is not yet approved or cleared by the [place] FDA and has been authorized for detection and/or diagnosis of SARS-CoV-2 by FDA under an  Emergency Use Authorization (EUA). This EUA will remain in effect (meaning this test can be used) for the duration of the COVID-19 declaration under Section 564(b)(1) of the Act, 21 U.S.C. section 360bbb-3(b)(1), unless the authorization is terminated or revoked.     Resp Syncytial Virus by PCR NEGATIVE NEGATIVE Final    Comment: (NOTE) Fact Sheet for Patients: BloggerCourse.com  Fact Sheet for Healthcare Providers: SeriousBroker.it  This test is not yet approved or cleared by the [place] FDA and has been authorized for detection and/or diagnosis of SARS-CoV-2 by FDA under an Emergency Use Authorization (EUA). This EUA will remain in effect (meaning this  test can be used) for the duration of the COVID-19 declaration under Section 564(b)(1) of the Act, 21 U.S.C. section 360bbb-3(b)(1), unless the authorization is terminated or revoked.  Performed at [facility], [address] [place], [place] [address]   Blood culture (routine x 2)     Status: None   Collection Time: 08/20/23  6:47 PM   Specimen: BLOOD RIGHT ARM  Result Value Ref Range Status   Specimen Description BLOOD RIGHT ARM  Final   Special Requests   Final    AEROBIC BOTTLE ONLY Blood Culture results may not be optimal due to an inadequate volume of blood received in culture bottles   Culture   Final    NO GROWTH 5 DAYS Performed at [facility], [address] [place], [place] [address]    Report Status 08/25/2023 FINAL  Final  Blood culture (routine x 2)     Status: None   Collection Time: 08/20/23  6:47 PM   Specimen: BLOOD RIGHT HAND  Result Value Ref Range Status   Specimen Description BLOOD RIGHT HAND  Final   Special Requests   Final    AEROBIC BOTTLE ONLY Blood Culture results may not be optimal due to an inadequate volume of blood received in culture bottles   Culture   Final    NO GROWTH 5 DAYS Performed at [facility], [address] [place], [place] [address]    Report Status 08/25/2023 FINAL  Final  Urine Culture     Status: Abnormal   Collection Time: 08/20/23  9:19 PM   Specimen: Urine, Random  Result Value Ref Range Status   Specimen Description   Final    URINE, RANDOM Performed at [facility], [address], [place], [place] [address]    Special Requests   Final    NONE Reflexed from [phone] Performed at [facility], [address] [place], [place] [address]    Culture (A)  Final    <10,000 COLONIES/mL INSIGNIFICANT GROWTH Performed at [facility], [address] [place], [place] [address]    Report Status 08/21/2023 FINAL  Final  Aerobic/Anaerobic Culture w Gram Stain  (surgical/deep wound)     Status: None   Collection Time: 08/22/23  9:00 AM   Specimen: Body Fluid  Result Value Ref Range Status   Specimen Description   Final    FLUID Performed at [facility], [address] [place], [place] [address]    Special Requests   Final    NONE Performed at [facility], [address] [place], [place] [address]    Gram Stain   Final    FEW WBC PRESENT, PREDOMINANTLY PMN NO ORGANISMS SEEN    Culture   Final    RARE ESCHERICHIA COLI Confirmed Extended Spectrum Beta-Lactamase Producer (ESBL).  In  bloodstream infections from ESBL organisms, carbapenems are preferred over piperacillin/tazobactam. They are shown to have a lower risk of mortality. NO ANAEROBES ISOLATED Performed at [facility], [address] [place], [place] [address]    Report Status 08/27/2023 FINAL  Final   Organism ID, Bacteria ESCHERICHIA COLI  Final      Susceptibility   Escherichia coli - MIC*    AMPICILLIN >=32 RESISTANT Resistant     CEFEPIME >=32 RESISTANT Resistant     CEFTAZIDIME RESISTANT Resistant     CEFTRIAXONE >=64 RESISTANT Resistant     CIPROFLOXACIN >=4 RESISTANT Resistant     GENTAMICIN <=1 SENSITIVE Sensitive     IMIPENEM <=0.25 SENSITIVE Sensitive     TRIMETH/SULFA <=20 SENSITIVE Sensitive     AMPICILLIN/SULBACTAM >=32 RESISTANT Resistant     PIP/TAZO <=4 SENSITIVE Sensitive ug/mL    * RARE ESCHERICHIA COLI    Labs: CBC: Recent Labs  Lab 08/30/23 1410  WBC 6.3  NEUTROABS 4.1  HGB 11.5*  HCT 34.6*  MCV 85.0  PLT 184   Basic Metabolic Panel: Recent Labs  Lab 08/30/23 1410  NA 134*  K 4.0  CL 98  CO2 26  GLUCOSE 109*  BUN 14  CREATININE 0.59*  CALCIUM 9.4   Liver Function Tests: Recent Labs  Lab 08/30/23 1410  AST 17  ALT 13  ALKPHOS 101  BILITOT 0.7  PROT 7.3  ALBUMIN 3.0*   CBG: No results for input(s): "GLUCAP" in the last 168 hours.  Discharge time spent: greater than 30  minutes.  Signed: Lucile Shutters, MD [facility] 09/01/2023

## 2023-09-01 NOTE — Progress Notes (Signed)
Report called to [facility] and given to [facility].

## 2023-09-27 LAB — LAB REPORT - SCANNED: EGFR: 90

## 2023-09-28 ENCOUNTER — Ambulatory Visit (INDEPENDENT_AMBULATORY_CARE_PROVIDER_SITE_OTHER): Payer: Self-pay | Admitting: Vascular Surgery

## 2023-09-28 ENCOUNTER — Encounter (INDEPENDENT_AMBULATORY_CARE_PROVIDER_SITE_OTHER): Payer: Self-pay | Admitting: Vascular Surgery

## 2023-09-28 VITALS — BP 106/62 | HR 79 | Resp 16 | Ht 67.0 in | Wt 152.0 lb

## 2023-09-28 DIAGNOSIS — I739 Peripheral vascular disease, unspecified: Secondary | ICD-10-CM

## 2023-09-28 DIAGNOSIS — I10 Essential (primary) hypertension: Secondary | ICD-10-CM

## 2023-09-28 DIAGNOSIS — E785 Hyperlipidemia, unspecified: Secondary | ICD-10-CM

## 2023-09-28 DIAGNOSIS — I7143 Infrarenal abdominal aortic aneurysm, without rupture: Secondary | ICD-10-CM

## 2023-09-28 NOTE — Progress Notes (Signed)
 Patient ID: [name], male   DOB: Jun 06, 1951, 73 y.o.   MRN: [id number]  Chief Complaint  Patient presents with   Follow-up    2 week follow up     HPI [name] is a 73 y.o. male.  I am asked to see the patient by Dr. Tawni Levy for evaluation of an AAA and significant PAD seen on a CT scan from January of this year.  I have independently reviewed this scan.  The CT scan demonstrates a 3.2 cm infrarenal abdominal aortic aneurysm.  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  This only shows the proximal portions of the femoral vessels as it was not a runoff study but just an abdomen and pelvis. The patient does report some pain in his legs with activity but no rest pain, ulceration, or gangrenous changes.  He is not a great historian with his history of schizophrenia and other issues.  He also has an ongoing workup and treatment for discitis at this time.     Past Medical History:  Diagnosis Date   Bipolar disorder (HCC)    COPD (chronic obstructive pulmonary disease) (HCC)    Depression    GERD (gastroesophageal reflux disease)    GIB (gastrointestinal bleeding)    Hyperlipidemia    Hypertension    Mild dementia (HCC)    Paranoid schizophrenia (HCC)    Smoker    Tobacco dependence     Past Surgical History:  Procedure Laterality Date   INTRAMEDULLARY (IM) NAIL INTERTROCHANTERIC Right 05/18/2023   Procedure: INTRAMEDULLARY (IM) NAIL INTERTROCHANTERIC;  Surgeon: Deeann Saint, MD;  Location: [facility] ORS;  Service: Orthopedics;  Laterality: Right;     Family History  Family history unknown: Yes     Social History   Tobacco Use   Smoking status: Every Day    Current packs/day: 1.00    Types: Cigarettes   Smokeless tobacco: Never  Vaping Use   Vaping status: Never Used  Substance Use Topics   Alcohol use: No    Alcohol/week: 0.0 standard drinks of alcohol   Drug use: No     Allergies  Allergen Reactions   Asa  [Aspirin]     Hx GI bleed    Current Outpatient Medications  Medication Sig Dispense Refill   amLODipine (NORVASC) 5 MG tablet Take 1 tablet (5 mg total) by mouth daily. 30 tablet 1   ammonium lactate (LAC-HYDRIN) 12 % lotion Apply topically 2 (two) times daily. 400 g 0   ascorbic acid (VITAMIN C) 500 MG tablet Take 1 tablet (500 mg total) by mouth 2 (two) times daily. 30 tablet 0   citalopram (CELEXA) 40 MG tablet Take 1 tablet (40 mg total) by mouth daily. 30 tablet 3   feeding supplement (ENSURE ENLIVE / ENSURE PLUS) LIQD Take 237 mLs by mouth 3 (three) times daily between meals. 237 mL 12   ferrous sulfate 325 (65 FE) MG tablet Take 1 tablet (325 mg total) by mouth daily with breakfast. 30 tablet 3   fluPHENAZine (PROLIXIN) 5 MG tablet Take 5 mg by mouth daily.     methimazole (TAPAZOLE) 5 MG tablet Take 5 mg by mouth 3 (three) times daily.     metoprolol succinate (TOPROL-XL) 25 MG 24 hr tablet Take 25 mg by mouth daily.     Multiple Vitamin (MULTIVITAMIN WITH MINERALS) TABS tablet Take 1 tablet by mouth daily. 30 tablet 0   risperiDONE (RISPERDAL) 1 MG tablet Take  1 tablet (1 mg total) by mouth daily. 30 tablet 3   senna (SENOKOT) 8.6 MG TABS tablet Take 1 tablet (8.6 mg total) by mouth 2 (two) times daily. (Patient taking differently: Take 2 tablets by mouth at bedtime.) 120 tablet 0   simvastatin (ZOCOR) 20 MG tablet TAKE 1 TABLET BY MOUTH DAILY 30 tablet 5   tamsulosin (FLOMAX) 0.4 MG CAPS capsule TAKE 1 CAPSULE BY MOUTH DAILY AFTER SUPPER (Patient taking differently: Take 0.4 mg by mouth 2 (two) times daily.) 90 capsule 3   vitamin B-12 (CYANOCOBALAMIN) 100 MCG tablet Take 100 mcg by mouth daily.     No current facility-administered medications for this visit.      REVIEW OF SYSTEMS (Negative unless checked)  Constitutional: [] Weight loss  [] Fever  [] Chills Cardiac: [] Chest pain   [] Chest pressure   [] Palpitations   [] Shortness of breath when laying flat   [] Shortness of  breath at rest   [] Shortness of breath with exertion. Vascular:  [x] Pain in legs with walking   [] Pain in legs at rest   [] Pain in legs when laying flat   [] Claudication   [] Pain in feet when walking  [] Pain in feet at rest  [] Pain in feet when laying flat   [] History of DVT   [] Phlebitis   [] Swelling in legs   [] Varicose veins   [] Non-healing ulcers Pulmonary:   [] Uses home oxygen   [] Productive cough   [] Hemoptysis   [] Wheeze  [x] COPD   [] Asthma Neurologic:  [] Dizziness  [] Blackouts   [] Seizures   [] History of stroke   [] History of TIA  [] Aphasia   [] Temporary blindness   [] Dysphagia   [] Weakness or numbness in arms   [] Weakness or numbness in legs Musculoskeletal:  [x] Arthritis   [] Joint swelling   [x] Joint pain   [] Low back pain Hematologic:  [] Easy bruising  [] Easy bleeding   [] Hypercoagulable state   [] Anemic  [] Hepatitis Gastrointestinal:  [x] Blood in stool   [] Vomiting blood  [x] Gastroesophageal reflux/heartburn   [] Abdominal pain Genitourinary:  [] Chronic kidney disease   [] Difficult urination  [] Frequent urination  [] Burning with urination   [] Hematuria Skin:  [] Rashes   [] Ulcers   [] Wounds Psychological:  [] History of anxiety   []  History of major depression.    Physical Exam BP 106/62   Pulse 79   Resp 16   Ht 5\' 7"  (1.702 m)   Wt 152 lb (68.9 kg)   BMI 23.81 kg/m  Gen:  WD/WN, NAD Head: [place]/AT, No temporalis wasting.  Ear/Nose/Throat: Hearing grossly intact, nares w/o erythema or drainage, oropharynx w/o Erythema/Exudate Eyes: Conjunctiva clear, sclera non-icteric  Neck: trachea midline.  No JVD.  Pulmonary:  Good air movement, respirations not labored, no use of accessory muscles  Cardiac: RRR, no JVD Vascular:  Vessel Right Left  Radial Palpable Palpable                          DP Trace  1+  PT 1+ 1+   Gastrointestinal:. No masses, surgical incisions, or scars. Musculoskeletal: M/S 5/5 throughout.  Extremities without ischemic changes.  No deformity or atrophy.  Trace LE edema. Neurologic: Sensation grossly intact in extremities.  Symmetrical.  Speech is fluent. Motor exam as listed above. Psychiatric: Judgment intact, Mood & affect appropriate for pt's clinical situation. Dermatologic: No rashes or ulcers noted.  No cellulitis or open wounds.    Radiology No results found.   Labs Recent Results (from the past 2160 hours)  Comprehensive  metabolic panel     Status: Abnormal   Collection Time: 08/20/23  3:42 PM  Result Value Ref Range   Sodium  135 - 145 mmol/L    QUESTIONABLE IDENTIFICATION / INCORRECTLY LABELED SPECIMEN    Comment: NOTIFIED BY LEXI OLIVER ON 08/20/23 AT 1751 QSD CORRECTED ON 01/31 AT 1802: PREVIOUSLY REPORTED AS 136    Potassium  3.5 - 5.1 mmol/L    QUESTIONABLE IDENTIFICATION / INCORRECTLY LABELED SPECIMEN    Comment: HEMOLYSIS AT THIS LEVEL MAY AFFECT RESULT CORRECTED ON 01/31 AT 1802: PREVIOUSLY REPORTED AS 4.7 HEMOLYSIS AT THIS LEVEL MAY AFFECT RESULT    Chloride  98 - 111 mmol/L    QUESTIONABLE IDENTIFICATION / INCORRECTLY LABELED SPECIMEN    Comment: CORRECTED ON 01/31 AT 1802: PREVIOUSLY REPORTED AS 100   CO2  22 - 32 mmol/L    QUESTIONABLE IDENTIFICATION / INCORRECTLY LABELED SPECIMEN    Comment: CORRECTED ON 01/31 AT 1802: PREVIOUSLY REPORTED AS 23   Glucose, Bld  70 - 99 mg/dL    QUESTIONABLE IDENTIFICATION / INCORRECTLY LABELED SPECIMEN    Comment: Glucose reference range applies only to samples taken after fasting for at least 8 hours. CORRECTED ON 01/31 AT 1802: PREVIOUSLY REPORTED AS 170 Glucose reference range applies only to samples taken after fasting for at least 8 hours.    BUN  8 - 23 mg/dL    QUESTIONABLE IDENTIFICATION / INCORRECTLY LABELED SPECIMEN    Comment: CORRECTED ON 01/31 AT 1802: PREVIOUSLY REPORTED AS 21   Creatinine, Ser  0.61 - 1.24 mg/dL    QUESTIONABLE IDENTIFICATION / INCORRECTLY LABELED SPECIMEN    Comment: CORRECTED ON 01/31 AT 1802: PREVIOUSLY REPORTED AS 1.01   Calcium  8.9 -  10.3 mg/dL    QUESTIONABLE IDENTIFICATION / INCORRECTLY LABELED SPECIMEN    Comment: CORRECTED ON 01/31 AT 1802: PREVIOUSLY REPORTED AS 10.0   Total Protein  6.5 - 8.1 g/dL    QUESTIONABLE IDENTIFICATION / INCORRECTLY LABELED SPECIMEN    Comment: CORRECTED ON 01/31 AT 1806: PREVIOUSLY REPORTED AS 8.8   Albumin  3.5 - 5.0 g/dL    QUESTIONABLE IDENTIFICATION / INCORRECTLY LABELED SPECIMEN    Comment: CORRECTED ON 01/31 AT 1806: PREVIOUSLY REPORTED AS 3.5   AST  15 - 41 U/L    QUESTIONABLE IDENTIFICATION / INCORRECTLY LABELED SPECIMEN    Comment: HEMOLYSIS AT THIS LEVEL MAY AFFECT RESULT CORRECTED ON 01/31 AT 1806: PREVIOUSLY REPORTED AS 29 HEMOLYSIS AT THIS LEVEL MAY AFFECT RESULT    ALT  0 - 44 U/L    QUESTIONABLE IDENTIFICATION / INCORRECTLY LABELED SPECIMEN    Comment: HEMOLYSIS AT THIS LEVEL MAY AFFECT RESULT CORRECTED ON 01/31 AT 1806: PREVIOUSLY REPORTED AS 10 HEMOLYSIS AT THIS LEVEL MAY AFFECT RESULT    Alkaline Phosphatase  38 - 126 U/L    QUESTIONABLE IDENTIFICATION / INCORRECTLY LABELED SPECIMEN    Comment: CORRECTED ON 01/31 AT 1806: PREVIOUSLY REPORTED AS 106   Total Bilirubin 1.7 (H) 0.0 - 1.2 mg/dL    Comment: HEMOLYSIS AT THIS LEVEL MAY AFFECT RESULT   GFR, Estimated NOT CALCULATED >60 mL/min    Comment: (NOTE) Calculated using the CKD-EPI Creatinine Equation (2021) CORRECTED ON 01/31 AT 1802: PREVIOUSLY REPORTED AS >60    Anion gap 13 5 - 15    Comment: Performed at [facility], [address] [place], [place] [address]  Lactic acid, plasma     Status: None   Collection Time: 08/20/23  3:42 PM  Result Value Ref Range  Lactic Acid, Venous  0.5 - 1.9 mmol/L    QUESTIONABLE IDENTIFICATION / INCORRECTLY LABELED SPECIMEN    Comment: CRITICAL RESULT CALLED TO, READ BACK BY AND VERIFIED WITH AMBER PAYNE 08/20/2023 AT 1619 SRR Performed at [facility], [address] [place], [place] [address] CORRECTED ON 01/31 AT 1818: PREVIOUSLY REPORTED AS 3.8  CRITICAL RESULT CALLED TO, READ BACK BY AND VERIFIED WITH AMBER PAYNE 08/20/2023 AT 1619 SRR   CBC with Differential     Status: None   Collection Time: 08/20/23  3:42 PM  Result Value Ref Range   WBC  4.0 - 10.5 K/uL    QUESTIONABLE IDENTIFICATION / INCORRECTLY LABELED SPECIMEN    Comment: NOTIFIED BY LEXI OLIVER ON 08/20/23 AT 1751 QSD CORRECTED ON 01/31 AT 1809: PREVIOUSLY REPORTED AS 11.0 WHITE COUNT CONFIRMED ON SMEAR    RBC  4.22 - 5.81 MIL/uL    QUESTIONABLE IDENTIFICATION / INCORRECTLY LABELED SPECIMEN    Comment: CORRECTED ON 01/31 AT 1809: PREVIOUSLY REPORTED AS 5.14   Hemoglobin  13.0 - 17.0 g/dL    QUESTIONABLE IDENTIFICATION / INCORRECTLY LABELED SPECIMEN    Comment: CORRECTED ON 01/31 AT 1809: PREVIOUSLY REPORTED AS 14.2   HCT  39.0 - 52.0 %    QUESTIONABLE IDENTIFICATION / INCORRECTLY LABELED SPECIMEN    Comment: CORRECTED ON 01/31 AT 1809: PREVIOUSLY REPORTED AS 45.8   MCV  80.0 - 100.0 fL    QUESTIONABLE IDENTIFICATION / INCORRECTLY LABELED SPECIMEN    Comment: CORRECTED ON 01/31 AT 1809: PREVIOUSLY REPORTED AS 89.1   MCH  26.0 - 34.0 pg    QUESTIONABLE IDENTIFICATION / INCORRECTLY LABELED SPECIMEN    Comment: CORRECTED ON 01/31 AT 1809: PREVIOUSLY REPORTED AS 27.6   MCHC  30.0 - 36.0 g/dL    QUESTIONABLE IDENTIFICATION / INCORRECTLY LABELED SPECIMEN    Comment: CORRECTED ON 01/31 AT 1809: PREVIOUSLY REPORTED AS 31.0   RDW  11.5 - 15.5 %    QUESTIONABLE IDENTIFICATION / INCORRECTLY LABELED SPECIMEN    Comment: CORRECTED ON 01/31 AT 1809: PREVIOUSLY REPORTED AS 16.3   Platelets  150 - 400 K/uL    QUESTIONABLE IDENTIFICATION / INCORRECTLY LABELED SPECIMEN    Comment: CORRECTED ON 01/31 AT 1809: PREVIOUSLY REPORTED AS 176   nRBC  0.0 - 0.2 %    QUESTIONABLE IDENTIFICATION / INCORRECTLY LABELED SPECIMEN    Comment: CORRECTED ON 01/31 AT 1809: PREVIOUSLY REPORTED AS 0.0   Neutrophils Relative %  %    QUESTIONABLE IDENTIFICATION / INCORRECTLY LABELED SPECIMEN     Comment: CORRECTED ON 01/31 AT 1809: PREVIOUSLY REPORTED AS 87   Neutro Abs  1.7 - 7.7 K/uL    QUESTIONABLE IDENTIFICATION / INCORRECTLY LABELED SPECIMEN    Comment: CORRECTED ON 01/31 AT 1809: PREVIOUSLY REPORTED AS 9.4   Lymphocytes Relative  %    QUESTIONABLE IDENTIFICATION / INCORRECTLY LABELED SPECIMEN    Comment: CORRECTED ON 01/31 AT 1809: PREVIOUSLY REPORTED AS 9   Lymphs Abs  0.7 - 4.0 K/uL    QUESTIONABLE IDENTIFICATION / INCORRECTLY LABELED SPECIMEN    Comment: CORRECTED ON 01/31 AT 1809: PREVIOUSLY REPORTED AS 1.0   Monocytes Relative  %    QUESTIONABLE IDENTIFICATION / INCORRECTLY LABELED SPECIMEN    Comment: CORRECTED ON 01/31 AT 1809: PREVIOUSLY REPORTED AS 4   Monocytes Absolute  0.1 - 1.0 K/uL    QUESTIONABLE IDENTIFICATION / INCORRECTLY LABELED SPECIMEN    Comment: CORRECTED ON 01/31 AT 1809: PREVIOUSLY REPORTED AS 0.5   Eosinophils Relative  %  QUESTIONABLE IDENTIFICATION / INCORRECTLY LABELED SPECIMEN    Comment: CORRECTED ON 01/31 AT 1809: PREVIOUSLY REPORTED AS 0   Eosinophils Absolute  0.0 - 0.5 K/uL    QUESTIONABLE IDENTIFICATION / INCORRECTLY LABELED SPECIMEN    Comment: CORRECTED ON 01/31 AT 1809: PREVIOUSLY REPORTED AS 0.0   Basophils Relative  %    QUESTIONABLE IDENTIFICATION / INCORRECTLY LABELED SPECIMEN    Comment: CORRECTED ON 01/31 AT 1809: PREVIOUSLY REPORTED AS 0   Basophils Absolute  0.0 - 0.1 K/uL    QUESTIONABLE IDENTIFICATION / INCORRECTLY LABELED SPECIMEN    Comment: CORRECTED ON 01/31 AT 1809: PREVIOUSLY REPORTED AS 0.0   WBC Morphology      QUESTIONABLE IDENTIFICATION / INCORRECTLY LABELED SPECIMEN    Comment: CORRECTED ON 01/31 AT 1809: PREVIOUSLY REPORTED AS MORPHOLOGY UNREMARKABLE   RBC Morphology      QUESTIONABLE IDENTIFICATION / INCORRECTLY LABELED SPECIMEN    Comment: CORRECTED ON 01/31 AT 1809: PREVIOUSLY REPORTED AS MORPHOLOGY UNREMARKABLE   Smear Review      QUESTIONABLE IDENTIFICATION / INCORRECTLY LABELED SPECIMEN    Comment:  CORRECTED ON 01/31 AT 1809: PREVIOUSLY REPORTED AS Normal platelet morphology   Immature Granulocytes  %    QUESTIONABLE IDENTIFICATION / INCORRECTLY LABELED SPECIMEN    Comment: CORRECTED ON 01/31 AT 1809: PREVIOUSLY REPORTED AS 0   Abs Immature Granulocytes  0.00 - 0.07 K/uL    QUESTIONABLE IDENTIFICATION / INCORRECTLY LABELED SPECIMEN    Comment: Performed at [facility], [address] [place], [place] [address] CORRECTED ON 01/31 AT 1809: PREVIOUSLY REPORTED AS 0.04   Lactic acid, plasma     Status: None   Collection Time: 08/20/23  5:13 PM  Result Value Ref Range   Lactic Acid, Venous  0.5 - 1.9 mmol/L    QUESTIONABLE IDENTIFICATION / INCORRECTLY LABELED SPECIMEN    Comment: NOTIFIED BY LEXI OLIVER ON 08/20/23 AT 1751 QSD Performed at [facility], [address] [place], [place] [address] CORRECTED ON 01/31 AT 1800: PREVIOUSLY REPORTED AS 1.4   Protime-INR     Status: None   Collection Time: 08/20/23  5:13 PM  Result Value Ref Range   Prothrombin Time  11.4 - 15.2 seconds    QUESTIONABLE IDENTIFICATION / INCORRECTLY LABELED SPECIMEN    Comment: NOTIFIED BY LEXI OLIVER ON 08/20/23 AT 1751 QSD CORRECTED ON 01/31 AT 1815: PREVIOUSLY REPORTED AS 14.5    INR  0.8 - 1.2    QUESTIONABLE IDENTIFICATION / INCORRECTLY LABELED SPECIMEN    Comment: (NOTE) INR goal varies based on device and disease states. Performed at [facility], [address] [place], [place] [address] CORRECTED ON 01/31 AT 1815: PREVIOUSLY REPORTED AS 1.1   Resp panel by RT-PCR (RSV, Flu A&B, Covid) Anterior Nasal Swab     Status: None   Collection Time: 08/20/23  5:19 PM   Specimen: Anterior Nasal Swab  Result Value Ref Range   SARS Coronavirus 2 by RT PCR NEGATIVE NEGATIVE    Comment: (NOTE) SARS-CoV-2 target nucleic acids are NOT DETECTED.  The SARS-CoV-2 RNA is generally detectable in upper respiratory specimens during the acute phase of infection. The  lowest concentration of SARS-CoV-2 viral copies this assay can detect is 138 copies/mL. A negative result does not preclude SARS-Cov-2 infection and should not be used as the sole basis for treatment or other patient management decisions. A negative result may occur with  improper specimen collection/handling, submission of specimen other than nasopharyngeal swab, presence of viral mutation(s) within the  areas targeted by this assay, and inadequate number of viral copies(<138 copies/mL). A negative result must be combined with clinical observations, patient history, and epidemiological information. The expected result is Negative.  Fact Sheet for Patients:  BloggerCourse.com  Fact Sheet for Healthcare Providers:  SeriousBroker.it  This test is no t yet approved or cleared by the [place] FDA and  has been authorized for detection and/or diagnosis of SARS-CoV-2 by FDA under an Emergency Use Authorization (EUA). This EUA will remain  in effect (meaning this test can be used) for the duration of the COVID-19 declaration under Section 564(b)(1) of the Act, 21 U.S.C.section 360bbb-3(b)(1), unless the authorization is terminated  or revoked sooner.       Influenza A by PCR NEGATIVE NEGATIVE   Influenza B by PCR NEGATIVE NEGATIVE    Comment: (NOTE) The Xpert Xpress SARS-CoV-2/FLU/RSV plus assay is intended as an aid in the diagnosis of influenza from Nasopharyngeal swab specimens and should not be used as a sole basis for treatment. Nasal washings and aspirates are unacceptable for Xpert Xpress SARS-CoV-2/FLU/RSV testing.  Fact Sheet for Patients: BloggerCourse.com  Fact Sheet for Healthcare Providers: SeriousBroker.it  This test is not yet approved or cleared by the [place] FDA and has been authorized for detection and/or diagnosis of SARS-CoV-2 by FDA under an Emergency  Use Authorization (EUA). This EUA will remain in effect (meaning this test can be used) for the duration of the COVID-19 declaration under Section 564(b)(1) of the Act, 21 U.S.C. section 360bbb-3(b)(1), unless the authorization is terminated or revoked.     Resp Syncytial Virus by PCR NEGATIVE NEGATIVE    Comment: (NOTE) Fact Sheet for Patients: BloggerCourse.com  Fact Sheet for Healthcare Providers: SeriousBroker.it  This test is not yet approved or cleared by the [place] FDA and has been authorized for detection and/or diagnosis of SARS-CoV-2 by FDA under an Emergency Use Authorization (EUA). This EUA will remain in effect (meaning this test can be used) for the duration of the COVID-19 declaration under Section 564(b)(1) of the Act, 21 U.S.C. section 360bbb-3(b)(1), unless the authorization is terminated or revoked.  Performed at [facility], [address] [place], [place] [address]   Lactic acid, plasma     Status: Abnormal   Collection Time: 08/20/23  6:47 PM  Result Value Ref Range   Lactic Acid, Venous 2.1 (HH) 0.5 - 1.9 mmol/L    Comment: CRITICAL RESULT CALLED TO, READ BACK BY AND VERIFIED WITH Charyl Dancer RN ED @ 1940 lfd Performed at [facility], [address] [place], [place] [address]   Blood culture (routine x 2)     Status: None   Collection Time: 08/20/23  6:47 PM   Specimen: BLOOD RIGHT ARM  Result Value Ref Range   Specimen Description BLOOD RIGHT ARM    Special Requests      AEROBIC BOTTLE ONLY Blood Culture results may not be optimal due to an inadequate volume of blood received in culture bottles   Culture      NO GROWTH 5 DAYS Performed at [facility], [address], [place], [place] [address]    Report Status 08/25/2023 FINAL   Blood culture (routine x 2)     Status: None   Collection Time: 08/20/23  6:47 PM   Specimen: BLOOD RIGHT HAND  Result Value Ref  Range   Specimen Description BLOOD RIGHT HAND    Special Requests      AEROBIC BOTTLE ONLY Blood Culture  results may not be optimal due to an inadequate volume of blood received in culture bottles   Culture      NO GROWTH 5 DAYS Performed at [facility], [address] [place], [place] [address]    Report Status 08/25/2023 FINAL   Protime-INR     Status: None   Collection Time: 08/20/23  6:47 PM  Result Value Ref Range   Prothrombin Time 14.1 11.4 - 15.2 seconds   INR 1.1 0.8 - 1.2    Comment: (NOTE) INR goal varies based on device and disease states. Performed at [facility], [address] [place], [place] [address]   CBC with Differential     Status: Abnormal   Collection Time: 08/20/23  6:47 PM  Result Value Ref Range   WBC 11.3 (H) 4.0 - 10.5 K/uL    Comment: WHITE COUNT CONFIRMED ON SMEAR   RBC 4.56 4.22 - 5.81 MIL/uL   Hemoglobin 12.6 (L) 13.0 - 17.0 g/dL   HCT 82.9 56.2 - 13.0 %   MCV 86.8 80.0 - 100.0 fL   MCH 27.6 26.0 - 34.0 pg   MCHC 31.8 30.0 - 36.0 g/dL   RDW 86.5 (H) 78.4 - 69.6 %   Platelets 123 (L) 150 - 400 K/uL    Comment: REPEATED TO VERIFY   nRBC 0.0 0.0 - 0.2 %   Neutrophils Relative % 93 %   Neutro Abs 10.5 (H) 1.7 - 7.7 K/uL   Lymphocytes Relative 5 %   Lymphs Abs 0.6 (L) 0.7 - 4.0 K/uL   Monocytes Relative 2 %   Monocytes Absolute 0.2 0.1 - 1.0 K/uL   Eosinophils Relative 0 %   Eosinophils Absolute 0.0 0.0 - 0.5 K/uL   Basophils Relative 0 %   Basophils Absolute 0.0 0.0 - 0.1 K/uL   Immature Granulocytes 0 %   Abs Immature Granulocytes 0.05 0.00 - 0.07 K/uL    Comment: Performed at [facility], [address] [place], [place] [address]  Blood gas, venous     Status: Abnormal   Collection Time: 08/20/23  6:52 PM  Result Value Ref Range   pH, Ven 7.34 7.25 - 7.43   pCO2, Ven 56 44 - 60 mmHg   Bicarbonate 30.2 (H) 20.0 - 28.0 mmol/L   Acid-Base Excess 3.1 (H) 0.0 - 2.0 mmol/L   O2 Saturation 16 %    Patient temperature 37.0    Collection site VEIN     Comment: Performed at [facility], [address] [place], [place] [address]  Lactic acid, plasma     Status: None   Collection Time: 08/20/23  9:00 PM  Result Value Ref Range   Lactic Acid, Venous 1.3 0.5 - 1.9 mmol/L    Comment: Performed at [facility], [address] [place], [place] [address]  Comprehensive metabolic panel     Status: Abnormal   Collection Time: 08/20/23  9:00 PM  Result Value Ref Range   Sodium 138 135 - 145 mmol/L   Potassium 4.0 3.5 - 5.1 mmol/L   Chloride 106 98 - 111 mmol/L   CO2 23 22 - 32 mmol/L   Glucose, Bld 115 (H) 70 - 99 mg/dL    Comment: Glucose reference range applies only to samples taken after fasting for at least 8 hours.   BUN 20 8 - 23 mg/dL   Creatinine, Ser 7.25 0.61 - 1.24 mg/dL   Calcium 9.0 8.9 - 36.6 mg/dL   Total  Protein 7.0 6.5 - 8.1 g/dL   Albumin 2.9 (L) 3.5 - 5.0 g/dL   AST 13 (L) 15 - 41 U/L   ALT 10 0 - 44 U/L   Alkaline Phosphatase 89 38 - 126 U/L   Total Bilirubin 1.1 0.0 - 1.2 mg/dL   GFR, Estimated >40 >98 mL/min    Comment: (NOTE) Calculated using the CKD-EPI Creatinine Equation (2021)    Anion gap 9 5 - 15    Comment: Performed at [facility], [address] [place], [place] [address]  Urinalysis, w/ Reflex to Culture (Infection Suspected) -Urine, Clean Catch     Status: Abnormal   Collection Time: 08/20/23  9:19 PM  Result Value Ref Range   Specimen Source URINE, CATHETERIZED    Color, Urine YELLOW (A) YELLOW   APPearance HAZY (A) CLEAR   Specific Gravity, Urine >1.046 (H) 1.005 - 1.030   pH 5.0 5.0 - 8.0   Glucose, UA NEGATIVE NEGATIVE mg/dL   Hgb urine dipstick MODERATE (A) NEGATIVE   Bilirubin Urine NEGATIVE NEGATIVE   Ketones, ur NEGATIVE NEGATIVE mg/dL   Protein, ur 30 (A) NEGATIVE mg/dL   Nitrite POSITIVE (A) NEGATIVE   Leukocytes,Ua MODERATE (A) NEGATIVE   RBC / HPF 11-20 0 - 5 RBC/hpf   WBC, UA >50 0 - 5 WBC/hpf     Comment:        Reflex urine culture not performed if WBC <=10, OR if Squamous epithelial cells >5. If Squamous epithelial cells >5 suggest recollection.    Bacteria, UA MANY (A) NONE SEEN   Squamous Epithelial / HPF 0-5 0 - 5 /HPF   Mucus PRESENT    Hyaline Casts, UA PRESENT     Comment: Performed at [facility], [address] [place], [place] [address]  Urine Culture     Status: Abnormal   Collection Time: 08/20/23  9:19 PM   Specimen: Urine, Random  Result Value Ref Range   Specimen Description      URINE, RANDOM Performed at [facility], [address], [place], [place] [address]    Special Requests      NONE Reflexed from [id number] Performed at [facility], [address] [place], [place] [address]    Culture (A)     <10,000 COLONIES/mL INSIGNIFICANT GROWTH Performed at [facility], [address], [place], [place] [address]    Report Status 08/21/2023 FINAL   Basic metabolic panel     Status: Abnormal   Collection Time: 08/21/23  6:40 AM  Result Value Ref Range   Sodium 138 135 - 145 mmol/L   Potassium 3.7 3.5 - 5.1 mmol/L   Chloride 104 98 - 111 mmol/L   CO2 25 22 - 32 mmol/L   Glucose, Bld 76 70 - 99 mg/dL    Comment: Glucose reference range applies only to samples taken after fasting for at least 8 hours.   BUN 16 8 - 23 mg/dL   Creatinine, Ser 3.24 (L) 0.61 - 1.24 mg/dL   Calcium 9.2 8.9 - 40.1 mg/dL   GFR, Estimated >02 >72 mL/min    Comment: (NOTE) Calculated using the CKD-EPI Creatinine Equation (2021)    Anion gap 9 5 - 15    Comment: Performed at [facility], [address] [place], [place] [address]  CBC     Status: Abnormal   Collection Time: 08/21/23  6:40 AM  Result Value Ref Range   WBC 6.6 4.0 - 10.5  K/uL   RBC 4.06 (L) 4.22 - 5.81 MIL/uL   Hemoglobin 11.1 (L) 13.0 - 17.0 g/dL   HCT 16.1 (L) 09.6 - 04.5 %   MCV 86.2 80.0 - 100.0 fL   MCH 27.3 26.0 - 34.0 pg   MCHC 31.7 30.0 - 36.0 g/dL    RDW 40.9 (H) 81.1 - 15.5 %   Platelets 127 (L) 150 - 400 K/uL   nRBC 0.0 0.0 - 0.2 %    Comment: Performed at [facility], [address] [place], [place] [address]  CBC     Status: Abnormal   Collection Time: 08/22/23  6:16 AM  Result Value Ref Range   WBC 6.6 4.0 - 10.5 K/uL   RBC 4.07 (L) 4.22 - 5.81 MIL/uL   Hemoglobin 11.0 (L) 13.0 - 17.0 g/dL   HCT 29.5 (L) 62.1 - 30.8 %   MCV 86.0 80.0 - 100.0 fL   MCH 27.0 26.0 - 34.0 pg   MCHC 31.4 30.0 - 36.0 g/dL   RDW 65.7 (H) 84.6 - 96.2 %   Platelets 124 (L) 150 - 400 K/uL   nRBC 0.0 0.0 - 0.2 %    Comment: Performed at [facility], [address], [place], [place] [address]  Basic metabolic panel     Status: Abnormal   Collection Time: 08/22/23  6:16 AM  Result Value Ref Range   Sodium 135 135 - 145 mmol/L   Potassium 3.5 3.5 - 5.1 mmol/L   Chloride 102 98 - 111 mmol/L   CO2 22 22 - 32 mmol/L   Glucose, Bld 79 70 - 99 mg/dL    Comment: Glucose reference range applies only to samples taken after fasting for at least 8 hours.   BUN 10 8 - 23 mg/dL   Creatinine, Ser 1.32 (L) 0.61 - 1.24 mg/dL   Calcium 9.1 8.9 - 44.0 mg/dL   GFR, Estimated >10 >27 mL/min    Comment: (NOTE) Calculated using the CKD-EPI Creatinine Equation (2021)    Anion gap 11 5 - 15    Comment: Performed at [facility], [address] [place], [place] [address]  Aerobic/Anaerobic Culture w Gram Stain (surgical/deep wound)     Status: None   Collection Time: 08/22/23  9:00 AM   Specimen: Body Fluid  Result Value Ref Range   Specimen Description      FLUID Performed at [facility] [address] [place], [place] [address]    Special Requests      NONE Performed at [facility], [address] [place], [place] [address]    Gram Stain      FEW WBC PRESENT, PREDOMINANTLY PMN NO ORGANISMS SEEN    Culture      RARE ESCHERICHIA COLI Confirmed Extended Spectrum Beta-Lactamase Producer (ESBL).  In  bloodstream infections from ESBL organisms, carbapenems are preferred over piperacillin/tazobactam. They are shown to have a lower risk of mortality. NO ANAEROBES ISOLATED Performed at [facility], [address], [place], [place] [address]    Report Status 08/27/2023 FINAL    Organism ID, Bacteria ESCHERICHIA COLI       Susceptibility   Escherichia coli - MIC*    AMPICILLIN >=32 RESISTANT Resistant     CEFEPIME >=32 RESISTANT Resistant     CEFTAZIDIME RESISTANT Resistant     CEFTRIAXONE >=64 RESISTANT Resistant     CIPROFLOXACIN >=4 RESISTANT Resistant     GENTAMICIN <=1 SENSITIVE Sensitive  IMIPENEM <=0.25 SENSITIVE Sensitive     TRIMETH/SULFA <=20 SENSITIVE Sensitive     AMPICILLIN/SULBACTAM >=32 RESISTANT Resistant     PIP/TAZO <=4 SENSITIVE Sensitive ug/mL    * RARE ESCHERICHIA COLI  CBC     Status: Abnormal   Collection Time: 08/23/23  5:29 AM  Result Value Ref Range   WBC 5.3 4.0 - 10.5 K/uL   RBC 4.15 (L) 4.22 - 5.81 MIL/uL   Hemoglobin 11.4 (L) 13.0 - 17.0 g/dL   HCT 29.5 (L) 62.1 - 30.8 %   MCV 85.1 80.0 - 100.0 fL   MCH 27.5 26.0 - 34.0 pg   MCHC 32.3 30.0 - 36.0 g/dL   RDW 65.7 (H) 84.6 - 96.2 %   Platelets 126 (L) 150 - 400 K/uL   nRBC 0.0 0.0 - 0.2 %    Comment: Performed at [facility], [address] [place], [place] [address]  Basic metabolic panel     Status: Abnormal   Collection Time: 08/23/23  5:29 AM  Result Value Ref Range   Sodium 135 135 - 145 mmol/L   Potassium 3.5 3.5 - 5.1 mmol/L   Chloride 100 98 - 111 mmol/L   CO2 24 22 - 32 mmol/L   Glucose, Bld 79 70 - 99 mg/dL    Comment: Glucose reference range applies only to samples taken after fasting for at least 8 hours.   BUN 8 8 - 23 mg/dL   Creatinine, Ser 1.32 (L) 0.61 - 1.24 mg/dL   Calcium 9.2 8.9 - 44.0 mg/dL   GFR, Estimated >10 >27 mL/min    Comment: (NOTE) Calculated using the CKD-EPI Creatinine Equation (2021)    Anion gap 11 5 - 15    Comment: Performed at  [facility], [address] [place], [place] [address]  Creatinine, serum     Status: Abnormal   Collection Time: 08/24/23  5:43 AM  Result Value Ref Range   Creatinine, Ser 0.54 (L) 0.61 - 1.24 mg/dL   GFR, Estimated >44 >03 mL/min    Comment: (NOTE) Calculated using the CKD-EPI Creatinine Equation (2021) Performed at [facility], [address] [place], [place] [address]   CBC     Status: Abnormal   Collection Time: 08/25/23  6:01 AM  Result Value Ref Range   WBC 5.3 4.0 - 10.5 K/uL   RBC 3.96 (L) 4.22 - 5.81 MIL/uL   Hemoglobin 10.8 (L) 13.0 - 17.0 g/dL   HCT 95.6 (L) 38.7 - 56.4 %   MCV 85.1 80.0 - 100.0 fL   MCH 27.3 26.0 - 34.0 pg   MCHC 32.0 30.0 - 36.0 g/dL   RDW 33.2 (H) 95.1 - 88.4 %   Platelets 149 (L) 150 - 400 K/uL   nRBC 0.0 0.0 - 0.2 %    Comment: Performed at [facility], [address] [place], [place] [address]  Basic metabolic panel     Status: Abnormal   Collection Time: 08/25/23  6:01 AM  Result Value Ref Range   Sodium 133 (L) 135 - 145 mmol/L   Potassium 3.7 3.5 - 5.1 mmol/L   Chloride 101 98 - 111 mmol/L   CO2 24 22 - 32 mmol/L   Glucose, Bld 82 70 - 99 mg/dL    Comment: Glucose reference range applies only to samples taken after fasting for at least 8 hours.   BUN 11 8 - 23 mg/dL   Creatinine, Ser 3.01 (L) 0.61 - 1.24 mg/dL  Calcium 9.2 8.9 - 10.3 mg/dL   GFR, Estimated >16 >10 mL/min    Comment: (NOTE) Calculated using the CKD-EPI Creatinine Equation (2021)    Anion gap 8 5 - 15    Comment: Performed at [facility], [address] [place], [place] [address]  CBC with Differential/Platelet     Status: Abnormal   Collection Time: 08/30/23  2:10 PM  Result Value Ref Range   WBC 6.3 4.0 - 10.5 K/uL   RBC 4.07 (L) 4.22 - 5.81 MIL/uL   Hemoglobin 11.5 (L) 13.0 - 17.0 g/dL   HCT 40.9 (L) 81.1 - 91.4 %   MCV 85.0 80.0 - 100.0 fL   MCH 28.3 26.0 - 34.0 pg   MCHC 33.2 30.0 - 36.0 g/dL   RDW 78.2 (H)  95.6 - 15.5 %   Platelets 184 150 - 400 K/uL   nRBC 0.0 0.0 - 0.2 %   Neutrophils Relative % 65 %   Neutro Abs 4.1 1.7 - 7.7 K/uL   Lymphocytes Relative 24 %   Lymphs Abs 1.5 0.7 - 4.0 K/uL   Monocytes Relative 9 %   Monocytes Absolute 0.6 0.1 - 1.0 K/uL   Eosinophils Relative 1 %   Eosinophils Absolute 0.1 0.0 - 0.5 K/uL   Basophils Relative 1 %   Basophils Absolute 0.0 0.0 - 0.1 K/uL   Immature Granulocytes 0 %   Abs Immature Granulocytes 0.02 0.00 - 0.07 K/uL    Comment: Performed at [facility], [address] [place], [place] [address]  Comprehensive metabolic panel     Status: Abnormal   Collection Time: 08/30/23  2:10 PM  Result Value Ref Range   Sodium 134 (L) 135 - 145 mmol/L   Potassium 4.0 3.5 - 5.1 mmol/L   Chloride 98 98 - 111 mmol/L   CO2 26 22 - 32 mmol/L   Glucose, Bld 109 (H) 70 - 99 mg/dL    Comment: Glucose reference range applies only to samples taken after fasting for at least 8 hours.   BUN 14 8 - 23 mg/dL   Creatinine, Ser 6.57 (L) 0.61 - 1.24 mg/dL   Calcium 9.4 8.9 - 84.6 mg/dL   Total Protein 7.3 6.5 - 8.1 g/dL   Albumin 3.0 (L) 3.5 - 5.0 g/dL   AST 17 15 - 41 U/L   ALT 13 0 - 44 U/L   Alkaline Phosphatase 101 38 - 126 U/L   Total Bilirubin 0.7 0.0 - 1.2 mg/dL   GFR, Estimated >96 >29 mL/min    Comment: (NOTE) Calculated using the CKD-EPI Creatinine Equation (2021)    Anion gap 10 5 - 15    Comment: Performed at [facility], [address] [place], [place] [address]  Sedimentation rate     Status: Abnormal   Collection Time: 08/30/23  2:10 PM  Result Value Ref Range   Sed Rate 51 (H) 0 - 20 mm/hr    Comment: Performed at [facility], [address] [place], [place] [address]  C-reactive protein     Status: Abnormal   Collection Time: 08/30/23  2:10 PM  Result Value Ref Range   CRP 1.2 (H) <1.0 mg/dL    Comment: Performed at [facility], [address], [place], [place] [address]     Assessment/Plan:  Hypertension blood pressure control important in reducing the progression of atherosclerotic disease and aneurysmal growth. On appropriate oral medications.   Hyperlipidemia lipid control important in reducing  the progression of atherosclerotic disease. Continue statin therapy   AAA (abdominal aortic aneurysm) (HCC) 3.2 cm on recent CT scan which I have independently reviewed.  This can be followed annually.  Tobacco cessation and blood pressure control are important for reducing the risk of growth.  PVD (peripheral vascular disease) (HCC) The CT scan demonstrates a 3.2 cm infrarenal abdominal aortic aneurysm.  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  This only shows the proximal portions of the femoral vessels as it was not a runoff study but just an abdomen and pelvis. At current, he does not have any obvious limb threatening symptoms.  He is not a great historian.  We will get him in with some noninvasive studies in the near future at his convenience to further evaluate his perfusion status.      Festus Barren 10/04/2023, 1:21 PM   This note was created with Dragon medical transcription system.  Any errors from dictation are unintentional.

## 2023-09-30 ENCOUNTER — Ambulatory Visit: Payer: Medicare HMO | Attending: Infectious Diseases | Admitting: Infectious Diseases

## 2023-09-30 DIAGNOSIS — E785 Hyperlipidemia, unspecified: Secondary | ICD-10-CM | POA: Insufficient documentation

## 2023-09-30 DIAGNOSIS — I10 Essential (primary) hypertension: Secondary | ICD-10-CM | POA: Insufficient documentation

## 2023-09-30 DIAGNOSIS — M4646 Discitis, unspecified, lumbar region: Secondary | ICD-10-CM

## 2023-09-30 DIAGNOSIS — I714 Abdominal aortic aneurysm, without rupture, unspecified: Secondary | ICD-10-CM | POA: Insufficient documentation

## 2023-09-30 DIAGNOSIS — A498 Other bacterial infections of unspecified site: Secondary | ICD-10-CM

## 2023-09-30 DIAGNOSIS — F319 Bipolar disorder, unspecified: Secondary | ICD-10-CM | POA: Insufficient documentation

## 2023-09-30 DIAGNOSIS — J449 Chronic obstructive pulmonary disease, unspecified: Secondary | ICD-10-CM | POA: Diagnosis not present

## 2023-09-30 DIAGNOSIS — F209 Schizophrenia, unspecified: Secondary | ICD-10-CM | POA: Diagnosis not present

## 2023-09-30 DIAGNOSIS — E039 Hypothyroidism, unspecified: Secondary | ICD-10-CM | POA: Insufficient documentation

## 2023-09-30 DIAGNOSIS — M4626 Osteomyelitis of vertebra, lumbar region: Secondary | ICD-10-CM | POA: Insufficient documentation

## 2023-09-30 DIAGNOSIS — Z79899 Other long term (current) drug therapy: Secondary | ICD-10-CM | POA: Diagnosis not present

## 2023-09-30 DIAGNOSIS — Z1612 Extended spectrum beta lactamase (ESBL) resistance: Secondary | ICD-10-CM

## 2023-09-30 NOTE — Progress Notes (Signed)
 The purpose of this virtual visit is to provide medical care while limiting exposure to the novel coronavirus (COVID19) for both patient and office staff.   Consent was obtained for video visit:  Yes.   Answered questions that patient had about telehealth interaction:  Yes.   I discussed the limitations, risks, security and privacy concerns of performing an evaluation and management service by telephone. I also discussed with the patient that there may be a patient responsible charge related to this service. The patient expressed understanding and agreed to proceed.   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 with a history of Schizophrenia,HTN, HLD, COPD, bipolar disorder , ESBL ecoli bacteremia in Aug 2024 and complex rt renal lesion and was treated with meropenem for 2 weeks, readmitted 05/06/23-06/04/23 for communited rt hip fracture, rt hip celluliits , surgery for hip postponed due to cellulitis and after 11 days of IV antibiotic he underwent ORIF on 05/18/23 was sent to Rehab on 06/04/23. He presented to [facility] on  08/20/23 with altered mental status Blood cultures sent were neg Pt had CT chest/abd/pelvis CT abdomen pelvis showed irregular endplate at the disc at L1-L2 both endplates being involved.  Concerning for discitis and osteomyelitis.  The right kidney showed a 2.4 into 2.4 cm complex lesion along the lower pole. There was 3.2 into 2.9 cm infra abdominal aorta dilatation.  Also noted additionally there are common iliac artery dilatations. Patient was started on broad-spectrum antibiotic He underwent an MRI of the spine and that showed findings consistent with discitis osteomyelitis at L1-L2 with a small abscess in the right psoas muscle.  Patient underwent CT guided aspiration of the L1-L2 disc space as well as the right psoas muscle and it was ESBL  ecoli. HE was placed on carbapemen and then discharged to [facility] to complete 6 weeks on IV ertapenem until 10/03/23  PT says he is feeling fine No back pain  0/e looks fine  Labs reviewed from 3/10 Wbc 5.9 Hb 10.4 PLT 166 ESR 33 ( was 51 on 2/10) CRP 0.83  Impression/recommendation  73 year old male skilled nursing facility.  He has a history of schizophrenia, bipolar disorder, COPD Recent right hip fracture with ORIF adjust healed well History of ESBL E. coli bacteremia   Recent L1 and L2 discitis and osteomyelitis.  Aspiration of the disc space showed ESBL E. coli.  Patient is on ertapenem intravenous for total of 6 weeks of antibiotics until 10/03/2023 He has no back pain he has been he is 100% adherent to his medications The ESR is 33 and CRP is less than 1  After he completes IV antibiotic the PICC line can be removed and he will need any further antibiotics.  Schizophrenia on risperidone  Hypothyroidism on methimazole  Hypertension on amlodipine  Hyperlipidemia on atorvastatin  COPD on nebulizers  Right renal lower pole cystic lesion  Inferior abdominal aorta aneurysmal dilatation stable Discussed the management with the patient as well as with his nurse in detail. Total time spent on this patient visit was 25 minutes

## 2023-10-04 NOTE — Assessment & Plan Note (Addendum)
 The CT scan demonstrates a 3.2 cm infrarenal abdominal aortic aneurysm.  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  This only shows the proximal portions of the femoral vessels as it was not a runoff study but just an abdomen and pelvis. At current, he does not have any obvious limb threatening symptoms.  He is not a great historian.  We will get him in with some noninvasive studies in the near future at his convenience to further evaluate his perfusion status.

## 2023-10-04 NOTE — Assessment & Plan Note (Signed)
blood pressure control important in reducing the progression of atherosclerotic disease and aneurysmal growth. On appropriate oral medications.  

## 2023-10-04 NOTE — Assessment & Plan Note (Signed)
 lipid control important in reducing the progression of atherosclerotic disease. Continue statin therapy

## 2023-10-04 NOTE — Assessment & Plan Note (Signed)
 3.2 cm on recent CT scan which I have independently reviewed.  This can be followed annually.  Tobacco cessation and blood pressure control are important for reducing the risk of growth.

## 2023-10-05 ENCOUNTER — Ambulatory Visit: Payer: Medicare HMO | Admitting: Urology

## 2023-11-10 ENCOUNTER — Other Ambulatory Visit (INDEPENDENT_AMBULATORY_CARE_PROVIDER_SITE_OTHER): Payer: Self-pay | Admitting: Vascular Surgery

## 2023-11-10 DIAGNOSIS — I739 Peripheral vascular disease, unspecified: Secondary | ICD-10-CM

## 2023-11-11 ENCOUNTER — Ambulatory Visit (INDEPENDENT_AMBULATORY_CARE_PROVIDER_SITE_OTHER): Admitting: Nurse Practitioner

## 2023-11-11 ENCOUNTER — Encounter (INDEPENDENT_AMBULATORY_CARE_PROVIDER_SITE_OTHER)

## 2023-12-07 ENCOUNTER — Encounter (INDEPENDENT_AMBULATORY_CARE_PROVIDER_SITE_OTHER): Payer: Self-pay

## 2024-05-05 ENCOUNTER — Emergency Department
Admission: EM | Admit: 2024-05-05 | Discharge: 2024-05-05 | Disposition: A | Discharge status: Home / Self Care | Attending: Emergency Medicine | Admitting: Emergency Medicine

## 2024-05-05 ENCOUNTER — Emergency Department

## 2024-05-05 ENCOUNTER — Other Ambulatory Visit: Payer: Self-pay

## 2024-05-05 DIAGNOSIS — N390 Urinary tract infection, site not specified: Secondary | ICD-10-CM | POA: Diagnosis not present

## 2024-05-05 DIAGNOSIS — J449 Chronic obstructive pulmonary disease, unspecified: Secondary | ICD-10-CM | POA: Insufficient documentation

## 2024-05-05 DIAGNOSIS — R42 Dizziness and giddiness: Secondary | ICD-10-CM | POA: Diagnosis not present

## 2024-05-05 DIAGNOSIS — W19XXXA Unspecified fall, initial encounter: Secondary | ICD-10-CM

## 2024-05-05 DIAGNOSIS — I10 Essential (primary) hypertension: Secondary | ICD-10-CM | POA: Diagnosis not present

## 2024-05-05 DIAGNOSIS — F0390 Unspecified dementia without behavioral disturbance: Secondary | ICD-10-CM | POA: Diagnosis not present

## 2024-05-05 DIAGNOSIS — S42292A Other displaced fracture of upper end of left humerus, initial encounter for closed fracture: Secondary | ICD-10-CM | POA: Insufficient documentation

## 2024-05-05 DIAGNOSIS — W07XXXA Fall from chair, initial encounter: Secondary | ICD-10-CM | POA: Diagnosis not present

## 2024-05-05 DIAGNOSIS — D72829 Elevated white blood cell count, unspecified: Secondary | ICD-10-CM | POA: Insufficient documentation

## 2024-05-05 DIAGNOSIS — M25512 Pain in left shoulder: Secondary | ICD-10-CM | POA: Diagnosis present

## 2024-05-05 LAB — CBC WITH DIFFERENTIAL/PLATELET
Abs Immature Granulocytes: 0.04 K/uL (ref 0.00–0.07)
Basophils Absolute: 0 K/uL (ref 0.0–0.1)
Basophils Relative: 0 %
Eosinophils Absolute: 0 K/uL (ref 0.0–0.5)
Eosinophils Relative: 0 %
HCT: 44.1 % (ref 39.0–52.0)
Hemoglobin: 13.8 g/dL (ref 13.0–17.0)
Immature Granulocytes: 0 %
Lymphocytes Relative: 9 %
Lymphs Abs: 1 K/uL (ref 0.7–4.0)
MCH: 30.3 pg (ref 26.0–34.0)
MCHC: 31.3 g/dL (ref 30.0–36.0)
MCV: 96.7 fL (ref 80.0–100.0)
Monocytes Absolute: 0.7 K/uL (ref 0.1–1.0)
Monocytes Relative: 7 %
Neutro Abs: 9.4 K/uL — ABNORMAL HIGH (ref 1.7–7.7)
Neutrophils Relative %: 84 %
Platelets: 105 K/uL — ABNORMAL LOW (ref 150–400)
RBC: 4.56 MIL/uL (ref 4.22–5.81)
RDW: 14.5 % (ref 11.5–15.5)
WBC: 11.2 K/uL — ABNORMAL HIGH (ref 4.0–10.5)
nRBC: 0 % (ref 0.0–0.2)

## 2024-05-05 LAB — URINALYSIS, W/ REFLEX TO CULTURE (INFECTION SUSPECTED)
Bilirubin Urine: NEGATIVE
Glucose, UA: NEGATIVE mg/dL
Hgb urine dipstick: NEGATIVE
Ketones, ur: NEGATIVE mg/dL
Nitrite: POSITIVE — AB
Protein, ur: NEGATIVE mg/dL
Specific Gravity, Urine: 1.015 (ref 1.005–1.030)
pH: 7 (ref 5.0–8.0)

## 2024-05-05 LAB — TROPONIN I (HIGH SENSITIVITY)
Troponin I (High Sensitivity): 4 ng/L (ref <18)
Troponin I (High Sensitivity): 4 ng/L (ref <18)

## 2024-05-05 LAB — COMPREHENSIVE METABOLIC PANEL WITH GFR
ALT: 12 U/L (ref 0–44)
AST: 28 U/L (ref 15–41)
Albumin: 3.8 g/dL (ref 3.5–5.0)
Alkaline Phosphatase: 90 U/L (ref 38–126)
Anion gap: 13 (ref 5–15)
BUN: 14 mg/dL (ref 8–23)
CO2: 21 mmol/L — ABNORMAL LOW (ref 22–32)
Calcium: 9.6 mg/dL (ref 8.9–10.3)
Chloride: 101 mmol/L (ref 98–111)
Creatinine, Ser: 0.98 mg/dL (ref 0.61–1.24)
GFR, Estimated: >60 mL/min (ref >60)
Glucose, Bld: 130 mg/dL — ABNORMAL HIGH (ref 70–99)
Potassium: 4.3 mmol/L (ref 3.5–5.1)
Sodium: 135 mmol/L (ref 135–145)
Total Bilirubin: 1.2 mg/dL (ref 0.0–1.2)
Total Protein: 7.9 g/dL (ref 6.5–8.1)

## 2024-05-05 MED ORDER — CEFDINIR 300 MG PO CAPS: 300.0000 mg | ORAL_CAPSULE | Freq: Two times a day (BID) | ORAL | 0 refills | Status: AC

## 2024-05-05 MED ORDER — ACETAMINOPHEN 325 MG PO TABS
650.0000 mg | ORAL_TABLET | Freq: Once | ORAL | Status: AC
  Administered 2024-05-05: 650 mg via ORAL
  Filled 2024-05-05: qty 2

## 2024-05-05 MED ORDER — SODIUM CHLORIDE 0.9 % IV SOLN
1.0000 g | Freq: Once | INTRAVENOUS | Status: AC
  Administered 2024-05-05: 1 g via INTRAVENOUS
  Filled 2024-05-05: qty 10

## 2024-05-05 NOTE — Discharge Instructions (Signed)
 Please make sure to take the antibiotics as prescribed for UTI.  Please make sure you are using the shoulder immobilizer for your shoulder fracture on the left side.  I have put in a number for you to call for orthopedic surgery, Dr. Cleotilde, to follow-up in 5 days to week.  Please make sure to keep yourself hydrated.  Please follow-up with your primary care doctor next week to get reassessed.

## 2024-05-05 NOTE — ED Notes (Signed)
 Fall risk bundle is currently in place.

## 2024-05-05 NOTE — ED Provider Notes (Signed)
 [name] Provider Note    Event Date/Time   First MD Initiated Contact with Patient 05/05/24 1509     (approximate)   History   Fall   HPI  [name] is a 73 y.o. male with history of bipolar disorder, COPD, GERD, hypertension, hyperlipidemia, dementia, schizophrenia, presenting for fall.  Patient states that he was sitting in a chair, felt slightly lightheaded, fell onto his left shoulder.  No LOC.  Per independent history from EMS, fall was witnessed by resident.  He was A and O x 2 for them.  Patient denies any lightheadedness or dizziness at this time.  States that he has been drinking and eating appropriately.  Denies any cough or chest pain, no shortness of breath, other than the left shoulder pain, he has no pain anywhere else.  He denies any weakness or numbness focally.  No urinary symptoms.  Independent history obtained from EMS as above.  On independent review, he does not appear to be on any anticoagulation, was last admitted in February for altered mental status thought secondary to discitis/osteomyelitis.  He denies any back pain at this time.   Physical Exam   Triage Vital Signs: ED Triage Vitals  Encounter Vitals Group     BP 05/05/24 1515 (!) 140/65     Girls Systolic BP Percentile --      Girls Diastolic BP Percentile --      Boys Systolic BP Percentile --      Boys Diastolic BP Percentile --      Pulse Rate 05/05/24 1512 71     Resp 05/05/24 1512 18     Temp 05/05/24 1512 98.3 F (36.8 C)     Temp Source 05/05/24 1512 Oral     SpO2 05/05/24 1512 100 %     Weight --      Height --      Head Circumference --      Peak Flow --      Pain Score --      Pain Loc --      Pain Education --      Exclude from Growth Chart --     Most recent vital signs: Vitals:   05/05/24 1515 05/05/24 1922  BP: (!) 140/65   Pulse:    Resp:    Temp:  98.4 F (36.9 C)  SpO2:       General: Awake, no distress.  CV:  Good peripheral  perfusion.  Resp:  Normal effort.  No thoracic cage tenderness Abd:  No distention.  Soft nontender Other:  No palpable skull deformities or tenderness, no midline spinal tenderness, no tenderness to his bilateral lower extremities, no tenderness to his right upper extremity, he does have tenderness with slightly reduced range of motion to the left shoulder, no tenderness to the rest of his left lower extremity, able to fully range his left elbow, wrist, hands.  Grip strength is intact, no focal weakness or numbness.  Radial pulses are intact bilaterally.   ED Results / Procedures / Treatments   Labs (all labs ordered are listed, but only abnormal results are displayed) Labs Reviewed  COMPREHENSIVE METABOLIC PANEL WITH GFR - Abnormal; Notable for the following components:      Result Value   CO2 21 (*)    Glucose, Bld 130 (*)    All other components within normal limits  CBC WITH DIFFERENTIAL/PLATELET - Abnormal; Notable for the following components:   WBC 11.2 (*)  Platelets 105 (*)    Neutro Abs 9.4 (*)    All other components within normal limits  URINALYSIS, W/ REFLEX TO CULTURE (INFECTION SUSPECTED) - Abnormal; Notable for the following components:   Color, Urine YELLOW (*)    APPearance HAZY (*)    Nitrite POSITIVE (*)    Leukocytes,Ua TRACE (*)    Bacteria, UA RARE (*)    All other components within normal limits  URINE CULTURE  TROPONIN I (HIGH SENSITIVITY)  TROPONIN I (HIGH SENSITIVITY)     EKG  EKG shows, sinus rhythm, rate 77, normal QS, normal QTc, no obvious ischemic ST elevation, T wave flattening in 2, 3, incomplete right bundle with some LVH, this is slightly changed compared to prior.  Will get a repeat EKG.  He has no chest pain at this time.     RADIOLOGY On my independent interpretation, x-ray shows left proximal humeral fracture.   PROCEDURES:  Critical Care performed: No  Procedures   MEDICATIONS ORDERED IN ED: Medications  cefTRIAXone   (ROCEPHIN ) 1 g in sodium chloride  0.9 % 100 mL IVPB (1 g Intravenous New Bag/Given 05/05/24 1911)  acetaminophen  (TYLENOL ) tablet 650 mg (650 mg Oral Given 05/05/24 1910)     IMPRESSION / MDM / ASSESSMENT AND PLAN / ED COURSE  I reviewed the triage vital signs and the nursing notes.                              Differential diagnosis includes, but is not limited to, dehydration, electrolyte derangements, UTI, arrhythmia, atypical ACS, contusion, strain, sprain, fracture, intracranial hemorrhage.  Will get labs, EKG, troponin, CT head, cervical spine, x-ray shoulder, x-ray humerus.  Will give him some p.o. fluids here as well as a Tylenol .  Patient's presentation is most consistent with acute presentation with potential threat to life or bodily function.  Independent interpretation of labs and imaging below.  Clinical course as below, patient has been asymptomatic in the emergency department.  Discussed with him about imaging and lab results including his injuries as well as UTI, he is agreeable plan for discharge.  Will follow-up with primary care doctor next week to get reassessed, will also given number to call for orthopedic surgery to follow-up in 5 to 7 days.  Otherwise considered but no indication for inpatient admission at this time, he safe for outpatient management.  Will discharge back to facility.  Shared decision making done with patient and he is agreeable with this plan.  Discharged with strict return precautions.  The patient is on the cardiac monitor to evaluate for evidence of arrhythmia and/or significant heart rate changes.   Clinical Course as of 05/05/24 1934  Fri May 05, 2024  1610 Independent review of labs, mild leukocytosis, electrolytes not severely deranged, LFTs are normal, troponin is not elevated. [TT]  1653 DG Shoulder Left IMPRESSION: 1. Comminuted displaced oblique fracture of the surgical neck of the left proximal humerus extending through the proximal  diaphysis medially.   [TT]  1706 CT Head Wo Contrast IMPRESSION: 1. No acute intracranial abnormality related to trauma. 2. 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 3. Chronic microvascular ischemic changes. 4. Mild parenchymal volume loss.   [TT]  1706 CT Cervical Spine Wo Contrast 1. No acute cervical spine fracture or traumatic malalignment.  [TT]  1726 Consulted orthopedic surgery who evaluated the images, states that he can get a  sling or shoulder immobilizer and see them in 5 days.  That is nonsurgical and will heal appropriately. [TT]  1841 Urinalysis, w/ Reflex to Culture (Infection Suspected) -Urine, Clean Catch(!) UA is consistent with UTI, will treat with IV ceftriaxone . [TT]  1930 On reassessment patient states that he is feeling a lot better, no recurrence of dizziness or lightheadedness, is eating in the room right now, discussed with him about imaging and lab results including his shoulder fracture as well as UTI.  Will give him a number to call for orthopedic surgery to follow-up in 5 days, will also prescribe him antibiotics for his UTI. [TT]    Clinical Course User Index [TT] Waymond Lorelle Cummins, MD     FINAL CLINICAL IMPRESSION(S) / ED DIAGNOSES   Final diagnoses:  Fall, initial encounter  Other closed displaced fracture of proximal end of left humerus, initial encounter  Urinary tract infection without hematuria, site unspecified  Lightheadedness     Rx / DC Orders   ED Discharge Orders          Ordered    cefdinir (OMNICEF) 300 MG capsule  2 times daily        05/05/24 1932             Note:  This document was prepared using Dragon voice recognition software and may include unintentional dictation errors.     Waymond Lorelle Cummins, MD 05/05/24 [phone]

## 2024-05-05 NOTE — ED Triage Notes (Signed)
 From a care facility Home, called for fall witnessed by another resident. EMS states there wasn't a offical care giver to report. Residnet states he was sitting on the chair and became dizzy, then fell. Denies c/p, shob. C/o pain to L arm. A&O x 2

## 2024-05-08 LAB — URINE CULTURE: Culture: >=100000 — AB

## 2024-05-09 NOTE — Progress Notes (Signed)
 ED Antimicrobial Stewardship Positive Culture Follow Up   [name] is an 73 y.o. male who presented to [facility] on 05/05/2024 with a chief complaint of  Chief Complaint  Patient presents with   Fall    Recent Results (from the past 720 hours)  Urine Culture     Status: Abnormal   Collection Time: 05/05/24  3:29 PM   Specimen: Urine, Random  Result Value Ref Range Status   Specimen Description   Final    URINE, RANDOM Performed at [facility], [address], [place], [place] [address]    Special Requests   Final    NONE Reflexed from [phone] Performed at [facility], [address] [place], [place] [address]    Culture (A)  Final    >=100,000 COLONIES/mL ESCHERICHIA COLI Confirmed Extended Spectrum Beta-Lactamase Producer (ESBL).  In bloodstream infections from ESBL organisms, carbapenems are preferred over piperacillin/tazobactam. They are shown to have a lower risk of mortality.    Report Status 05/08/2024 FINAL  Final   Organism ID, Bacteria ESCHERICHIA COLI (A)  Final      Susceptibility   Escherichia coli - MIC*    AMPICILLIN >=32 RESISTANT Resistant     CEFAZOLIN  (URINE) Value in next row Resistant      >=32 RESISTANTThis is a modified FDA-approved test that has been validated and its performance characteristics determined by the reporting laboratory.  This laboratory is certified under the Clinical Laboratory Improvement Amendments CLIA as qualified to perform high complexity clinical laboratory testing.    CEFEPIME  Value in next row Resistant      >=32 RESISTANTThis is a modified FDA-approved test that has been validated and its performance characteristics determined by the reporting laboratory.  This laboratory is certified under the Clinical Laboratory Improvement Amendments CLIA as qualified to perform high complexity clinical laboratory testing.    ERTAPENEM  Value in next row Sensitive      >=32 RESISTANTThis is a modified FDA-approved test  that has been validated and its performance characteristics determined by the reporting laboratory.  This laboratory is certified under the Clinical Laboratory Improvement Amendments CLIA as qualified to perform high complexity clinical laboratory testing.    CEFTRIAXONE  Value in next row Resistant      >=32 RESISTANTThis is a modified FDA-approved test that has been validated and its performance characteristics determined by the reporting laboratory.  This laboratory is certified under the Clinical Laboratory Improvement Amendments CLIA as qualified to perform high complexity clinical laboratory testing.    CIPROFLOXACIN Value in next row Resistant      >=32 RESISTANTThis is a modified FDA-approved test that has been validated and its performance characteristics determined by the reporting laboratory.  This laboratory is certified under the Clinical Laboratory Improvement Amendments CLIA as qualified to perform high complexity clinical laboratory testing.    GENTAMICIN  Value in next row Sensitive      >=32 RESISTANTThis is a modified FDA-approved test that has been validated and its performance characteristics determined by the reporting laboratory.  This laboratory is certified under the Clinical Laboratory Improvement Amendments CLIA as qualified to perform high complexity clinical laboratory testing.    NITROFURANTOIN Value in next row Sensitive      >=32 RESISTANTThis is a modified FDA-approved test that has been validated and its performance characteristics determined by the reporting laboratory.  This laboratory is certified under the Clinical Laboratory Improvement Amendments CLIA as qualified to perform high complexity clinical laboratory testing.    TRIMETH/SULFA Value  in next row Sensitive      >=32 RESISTANTThis is a modified FDA-approved test that has been validated and its performance characteristics determined by the reporting laboratory.  This laboratory is certified under the Clinical  Laboratory Improvement Amendments CLIA as qualified to perform high complexity clinical laboratory testing.    AMPICILLIN/SULBACTAM Value in next row Resistant      >=32 RESISTANTThis is a modified FDA-approved test that has been validated and its performance characteristics determined by the reporting laboratory.  This laboratory is certified under the Clinical Laboratory Improvement Amendments CLIA as qualified to perform high complexity clinical laboratory testing.    PIP/TAZO Value in next row Sensitive      8 SENSITIVEThis is a modified FDA-approved test that has been validated and its performance characteristics determined by the reporting laboratory.  This laboratory is certified under the Clinical Laboratory Improvement Amendments CLIA as qualified to perform high complexity clinical laboratory testing.    MEROPENEM  Value in next row Sensitive      8 SENSITIVEThis is a modified FDA-approved test that has been validated and its performance characteristics determined by the reporting laboratory.  This laboratory is certified under the Clinical Laboratory Improvement Amendments CLIA as qualified to perform high complexity clinical laboratory testing.    * >=100,000 COLONIES/mL ESCHERICHIA COLI    [x]  Treated with Cefdinir 300 mg BID x 10 days, organism resistant to prescribed antimicrobial []  Patient discharged originally without antimicrobial agent and treatment is now indicated  New antibiotic prescription: Recommending Bactrim 1 DS BID x 3 days  ED Provider: Dr. Dicky   Patient presented to ED from care facility home with fall. Did not have any urinary symptoms upon presentation. UA and urine culture was done showing > 100,000 colonies of ESBL E. Coli. Patient was discharged on Cefdinir 300 mg BID x 10 days for treatment of UTI. However, due to it being ESBL, the cefdinir will not cover the E. Coli. Since patient did not present with any symptoms of UTI, could consider not treating it or if  wanted to treat the ESBL E.coli, would recommend switching antibiotic to Bactrim 1 DS BID x 3 days. Spoke to ED provider, who preferred to treat. Reached out to the most recent facility listed in the chart, [facility], to check if patient is there and update regarding the change in antibiotic course. However, was informed patient was discharged from there back in March 2025. Afterwards, reached out to patient's nephew listed as alternative contact, who mentioned [facility], but patient not residing there either. In the end, called 6 different facilities, which patient isn't at either of any of them. Tried calling nephew again but kept going to voicemail. Unclear as to where the patient is residing. Will try to reach out to nephew again the next day and follow up.   10/22: Tried calling nephew again and was able to get in touch, who confirmed patient is with [facility] but staying with him. Informed him about the change in antibiotic regimen and was told to send prescription to [facility]. Called in prescription for Bactrim 1 DS BID x 3 days.   Ransom Blanch PGY-1 Pharmacy Resident  [facility]  05/09/2024 4:49 PM
